# Patient Record
Sex: Female | Born: 1935 | Race: White | Hispanic: No | Smoking: Former smoker
Health system: Southern US, Community
[De-identification: ages and names within clinical notes are randomized; demographics above are authoritative.]

## PROBLEM LIST (undated history)

## (undated) DIAGNOSIS — J449 Chronic obstructive pulmonary disease, unspecified: Secondary | ICD-10-CM

## (undated) DIAGNOSIS — I2781 Cor pulmonale (chronic): Secondary | ICD-10-CM

## (undated) DIAGNOSIS — Z87891 Personal history of nicotine dependence: Secondary | ICD-10-CM

## (undated) DIAGNOSIS — J45909 Unspecified asthma, uncomplicated: Secondary | ICD-10-CM

## (undated) DIAGNOSIS — R0902 Hypoxemia: Secondary | ICD-10-CM

## (undated) DIAGNOSIS — Z9289 Personal history of other medical treatment: Secondary | ICD-10-CM

## (undated) HISTORY — DX: Chronic obstructive pulmonary disease, unspecified: J44.9

## (undated) HISTORY — DX: Hypoxemia: R09.02

## (undated) HISTORY — DX: Personal history of nicotine dependence: Z87.891

## (undated) HISTORY — DX: Cor pulmonale (chronic): I27.81

## (undated) HISTORY — DX: Personal history of other medical treatment: Z92.89

---

## 1969-08-24 HISTORY — PX: CHOLECYSTECTOMY: SHX55

## 2005-11-04 ENCOUNTER — Emergency Department (HOSPITAL_COMMUNITY): Admission: EM | Admit: 2005-11-04 | Discharge: 2005-11-04 | Payer: Self-pay | Admitting: Family Medicine

## 2005-12-13 ENCOUNTER — Encounter: Admission: RE | Admit: 2005-12-13 | Discharge: 2005-12-13 | Payer: Self-pay | Admitting: Family Medicine

## 2006-12-04 ENCOUNTER — Ambulatory Visit: Payer: Self-pay | Admitting: Family Medicine

## 2006-12-24 DIAGNOSIS — Z9289 Personal history of other medical treatment: Secondary | ICD-10-CM

## 2006-12-24 HISTORY — DX: Personal history of other medical treatment: Z92.89

## 2007-01-22 ENCOUNTER — Ambulatory Visit (HOSPITAL_COMMUNITY): Admission: RE | Admit: 2007-01-22 | Discharge: 2007-01-22 | Payer: Self-pay | Admitting: Gastroenterology

## 2007-01-22 HISTORY — PX: COLONOSCOPY: SHX174

## 2007-02-19 ENCOUNTER — Ambulatory Visit (HOSPITAL_COMMUNITY): Admission: RE | Admit: 2007-02-19 | Discharge: 2007-02-19 | Payer: Self-pay | Admitting: Ophthalmology

## 2007-02-19 ENCOUNTER — Encounter (INDEPENDENT_AMBULATORY_CARE_PROVIDER_SITE_OTHER): Payer: Self-pay | Admitting: Specialist

## 2007-11-10 ENCOUNTER — Ambulatory Visit: Payer: Self-pay | Admitting: Family Medicine

## 2007-12-13 LAB — HM DEXA SCAN

## 2007-12-20 ENCOUNTER — Emergency Department (HOSPITAL_COMMUNITY): Admission: EM | Admit: 2007-12-20 | Discharge: 2007-12-20 | Payer: Self-pay | Admitting: Emergency Medicine

## 2007-12-22 ENCOUNTER — Ambulatory Visit: Payer: Self-pay | Admitting: Family Medicine

## 2008-03-26 ENCOUNTER — Ambulatory Visit: Payer: Self-pay | Admitting: Family Medicine

## 2008-09-28 ENCOUNTER — Ambulatory Visit: Payer: Self-pay | Admitting: Family Medicine

## 2009-05-02 ENCOUNTER — Ambulatory Visit (HOSPITAL_COMMUNITY): Admission: RE | Admit: 2009-05-02 | Discharge: 2009-05-02 | Payer: Self-pay | Admitting: Ophthalmology

## 2010-01-07 ENCOUNTER — Emergency Department (HOSPITAL_COMMUNITY): Admission: EM | Admit: 2010-01-07 | Discharge: 2010-01-07 | Payer: Self-pay | Admitting: Family Medicine

## 2010-01-09 ENCOUNTER — Ambulatory Visit: Payer: Self-pay | Admitting: Family Medicine

## 2010-01-16 ENCOUNTER — Ambulatory Visit: Payer: Self-pay | Admitting: Internal Medicine

## 2010-01-16 DIAGNOSIS — F17200 Nicotine dependence, unspecified, uncomplicated: Secondary | ICD-10-CM | POA: Insufficient documentation

## 2010-01-16 DIAGNOSIS — J449 Chronic obstructive pulmonary disease, unspecified: Secondary | ICD-10-CM | POA: Insufficient documentation

## 2010-01-19 ENCOUNTER — Ambulatory Visit: Payer: Self-pay | Admitting: Family Medicine

## 2010-01-24 DIAGNOSIS — J449 Chronic obstructive pulmonary disease, unspecified: Secondary | ICD-10-CM

## 2010-01-24 HISTORY — DX: Chronic obstructive pulmonary disease, unspecified: J44.9

## 2010-01-26 ENCOUNTER — Ambulatory Visit: Payer: Self-pay | Admitting: Family Medicine

## 2010-02-21 ENCOUNTER — Ambulatory Visit: Payer: Self-pay | Admitting: Family Medicine

## 2010-02-28 ENCOUNTER — Telehealth: Payer: Self-pay | Admitting: Internal Medicine

## 2010-02-28 ENCOUNTER — Ambulatory Visit: Payer: Self-pay | Admitting: Internal Medicine

## 2010-09-20 ENCOUNTER — Ambulatory Visit: Payer: Self-pay | Admitting: Family Medicine

## 2010-09-30 ENCOUNTER — Emergency Department (HOSPITAL_COMMUNITY): Admission: EM | Admit: 2010-09-30 | Discharge: 2010-09-30 | Payer: Self-pay | Admitting: Emergency Medicine

## 2010-10-03 ENCOUNTER — Ambulatory Visit: Payer: Self-pay | Admitting: Family Medicine

## 2010-10-12 ENCOUNTER — Ambulatory Visit: Payer: Self-pay | Admitting: Family Medicine

## 2011-01-23 NOTE — Assessment & Plan Note (Signed)
Summary: Pulmonary/ summary f/u ov   Copy to:  Dr. Susann Givens Primary Provider/Referring Provider:  Dr. Sharlot Gowda  CC:  6 wk followup with PFT's.  Pt states that her breathing has improved some since last seen.  She states that she has cut down her smoking to less than 1/2 ppd.  No new complaints today.  She is currently on pred pack and augmentin fort sinus infection.Marland Kitchen  History of Present Illness: 30 yowf active smoker with chronic doe x mall walking x 2008 assoc withchronic am cough and congestion  January 16, 2010 cc sob acutely worse 01/05/2010 cough green mucus  and sob rx as uri with zmax, albuterol and benzoate and seen now @ Dr Susann Givens and daughters request.  Mucus turning white, breathing better.    February 28, 2010 6 wk followup with PFT's.  Pt states that her breathing has improved some since last seen.  She states that she has cut down her smoking to less than 1/2 ppd.  No new complaints today.  She is currently on pred pack and augmentin fort sinus infection. not limited  by sob, not using proaire at all.  no noct co's.  Pt denies any significant sore throat, dysphagia, itching, sneezing,  nasal congestion or excess secretions,  fever, chills, sweats, unintended wt loss, pleuritic or exertional cp, hempoptysis, change in activity tolerance  orthopnea pnd or leg swelling  Pt also denies any obvious fluctuation in symptoms with weather or environmental change or other alleviating or aggravating factors.     Pt denies any increase in rescue therapy over baseline, denies waking up needing it or having early am exacerbations of coughing/wheezing/ or dyspnea   Current Medications (verified): 1)  Benzonatate 100 Mg Caps (Benzonatate) .Marland Kitchen.. 1 Every 12 Hours As Needed 2)  Proair Hfa 108 (90 Base) Mcg/act Aers (Albuterol Sulfate) .... 2 Puffs Every 4 Hours As Needed 3)  Cvs Nasal Decongestant 30 Mg Tabs (Pseudoephedrine Hcl) .... A Directed As Needed 4)  Flutter Valve .... As  Directed  Allergies (verified): No Known Drug Allergies  Past History:  Past Medical History: COPD  GOLD III    - PFT's February 28, 2010 FEV1 1.04 (48%) ratio 49 with no better after B2 and DLC0 6.3 (42%) corrects to 43  Vital Signs:  Patient profile:   77 year old female Weight:      101 pounds BMI:     20.47 O2 Sat:      97 % on Room air Temp:     97.4 degrees F oral Pulse rate:   74 / minute BP sitting:   132 / 76  (left arm)  Vitals Entered By: Vernie Murders (February 28, 2010 11:50 AM)  O2 Flow:  Room air  Physical Exam  Additional Exam:  thin amb wf nad with minimally congested cough wt 98 January 16, 2010 > 101 February 28, 2010  HEENT mild turbinate edema.  Oropharynx no thrush or excess pnd or cobblestoning.  No JVD or cervical adenopathy. Mild accessory muscle hypertrophy. Trachea midline, nl thryroid. Chest was hyperinflated by percussion with diminished breath sounds and moderate increased exp time without wheeze. Hoover sign positive at mid inspiration. Regular rate and rhythm without murmur gallop or rub or increase P2 or edema.  Abd: no hsm, nl excursion. Ext warm without cyanosis or clubbing.     Impression & Recommendations:  Problem # 1:  COPD UNSPECIFIED (ICD-496) DDX of  difficult airways managment all start with  A and  include Adherence, Ace Inhibitors, Acid Reflux, Active Sinus Disease, Alpha 1 Antitripsin deficiency, Anxiety masquerading as Airways dz,  ABPA,  allergy(esp in young), Aspiration (esp in elderly), Adverse effects of DPI,  Active smokers, plus one B  = Beta blocker use..   Clearly Active smoking leads the list of usual suspects here. Other than smoking cessation, treatment is symptom directed with use of hfa reviewed.   Problem # 2:  SMOKER (ICD-305.1)  > 3 min spent   I took this opportunity to educate the patient regarding the consequences of smoking in airway disorders based on all the data we have from the multiple national lung health  studies indicating that smoking cessation, not choice of inhalers or physicians, is the most important aspect of care.  Discussed but not ready to committ to quit at this point - emphasized risks involved in continuing smoking and that patient should consider these in the context of the cost of smoking relative to the benefit obtained ( immediate effect would be better MC function, longterm would be less aecopd and progressive loss of best day fx)  Orders: Tobacco use cessation intermediate 3-10 minutes (44010)  Medications Added to Medication List This Visit: 1)  Cvs Nasal Decongestant 30 Mg Tabs (Pseudoephedrine hcl) .... A directed as needed 2)  Flutter Valve  .... As directed  Other Orders: Est. Patient Level III (27253)  Patient Instructions: 1)  Stopping smoking will do more for you than anything I can do 2)  Return if limited by breathing difficulty

## 2011-01-23 NOTE — Miscellaneous (Signed)
Summary: Orders Update pft charges  Clinical Lists Changes  Orders: Added new Service order of Carbon Monoxide diffusing w/capacity (94720) - Signed Added new Service order of Lung Volumes (94240) - Signed Added new Service order of Spirometry (Pre & Post) (94060) - Signed 

## 2011-01-23 NOTE — Progress Notes (Signed)
Summary: questions re: diet    Phone Note Call from Patient Call back at Home Phone [phone]   Caller: Patient Call For: [name] Summary of Call: pt wants to know why she is not allowed to eat dairy  or have caffein? (pt forgot to ask while she was here). pt says she was given these instructions at urgent care recently Initial call taken by: Tivis Ringer, CNA,  February 28, 2010 12:25 PM  Follow-up for Phone Call        will forward message to MW to address.  Aundra Millet Reynolds LPN  February 29, 980 1:19 PM  don't ssee a problem with either one from pulmonary perspective Follow-up by: Nyoka Cowden MD,  February 28, 2010 1:22 PM  Additional Follow-up for Phone Call Additional follow up Details #1::        LM with pt's spouse for pt to call me back. Aundra Millet Reynolds LPN  February 28, 1913 1:27 PM   returned phone call pt [phone].Darletta Moll  February 28, 2010 2:05 PM  advised pt per MW he sees no problem with caff. or dairy as far as pulmonary goes, But I advised ot to contact md at urgent care who told her to not consume these products because there might be another reason for her to avoid them .Carron Curie CMA  March 01, 2010 10:36 AM

## 2011-01-23 NOTE — Assessment & Plan Note (Signed)
Summary: Pulmonary/ eval copd / MC dysfunction/ add flutter   Visit Type:  Initial Consult Copy to:  Dr. Susann Givens Primary Provider/Referring Provider:  Dr. Sharlot Gowda  CC:  Dyspnea.  History of Present Illness: 7 yowf active smoker with chronic doe x mall walking x 2008 assoc withchronic am cough and congestion  January 16, 2010 cc sob acutely worse 01/05/2010 cough green mucus  and sob rx as uri with zmax, albuterol and benzoate and seen now @ Dr Susann Givens and daughters request.  Mucus turning white, breathing better.  Pt denies any significant sore throat, dysphagia, itching, sneezing,  nasal congestion or excess secretions,  fever, chills, sweats, unintended wt loss, pleuritic or exertional cp, hempoptysis, change in activity tolerance  orthopnea pnd or leg swelling Pt also denies any obvious fluctuation in symptoms with weather or environmental change or other alleviating or aggravating factors.       Current Medications (verified): 1)  Benzonatate 100 Mg Caps (Benzonatate) .Marland Kitchen.. 1 Every 12 Hours As Needed 2)  Proair Hfa 108 (90 Base) Mcg/act Aers (Albuterol Sulfate) .... 2 Puffs Every 4 Hours As Needed  Allergies (verified): No Known Drug Allergies  Past History:  Past Medical History: COPD, clinical dx  Past Surgical History: Cholecystectomy 1980's  Family History: Colon CA- Mother COPD 2 brothers, smokers  Social History: Married with children Retired Current smoker since age 82.  Smokes 1 ppd.  Review of Systems       The patient complains of shortness of breath with activity, productive cough, loss of appetite, and weight change.  The patient denies shortness of breath at rest, non-productive cough, coughing up blood, chest pain, irregular heartbeats, acid heartburn, indigestion, abdominal pain, difficulty swallowing, sore throat, tooth/dental problems, headaches, nasal congestion/difficulty breathing through nose, sneezing, itching, ear ache, anxiety, depression,  hand/feet swelling, joint stiffness or pain, rash, change in color of mucus, and fever.    Vital Signs:  Patient profile:   77 year old female Height:      59 inches Weight:      98.25 pounds BMI:     19.92 O2 Sat:      94 % on Room air Temp:     98.4 degrees F oral Pulse rate:   80 / minute BP sitting:   100 / 66  (left arm)  Vitals Entered By: Vernie Murders (January 16, 2010 1:34 PM)  O2 Flow:  Room air  Physical Exam  Additional Exam:  thin amb wf nad with gurgling cough wt 98 January 16, 2010  HEENT mild turbinate edema.  Oropharynx no thrush or excess pnd or cobblestoning.  No JVD or cervical adenopathy. Mild accessory muscle hypertrophy. Trachea midline, nl thryroid. Chest was hyperinflated by percussion with diminished breath sounds and moderate increased exp time without wheeze. Hoover sign positive at mid inspiration. Regular rate and rhythm without murmur gallop or rub or increase P2 or edema.  Abd: no hsm, nl excursion. Ext warm without cyanosis or clubbing.     Impression & Recommendations:  Problem # 1:  COPD UNSPECIFIED (ICD-496) Moderate to severe clinically with very poor cough mechanics suggesting mucociliary dysfunction   I took this opportunity to educate the patient regarding the consequences of smoking in airway disorders based on all the data we have from the multiple national lung health studies indicating that smoking cessation, not choice of inhalers or physicians, is the most important aspect of care.    For now strongly rec committ to quit, add mucinex and flutter  Problem # 2:  SMOKER (ICD-305.1) Discussed but not ready to committ to quit at this point - emphasized risks involved in continuing smoking and that patient should consider these in the context of the cost of smoking relative to the benefit obtained.   Medications Added to Medication List This Visit: 1)  Benzonatate 100 Mg Caps (Benzonatate) .Marland Kitchen.. 1 every 12 hours as needed 2)  Proair Hfa  108 (90 Base) Mcg/act Aers (Albuterol sulfate) .... 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

## 2011-02-14 ENCOUNTER — Ambulatory Visit (INDEPENDENT_AMBULATORY_CARE_PROVIDER_SITE_OTHER): Payer: Medicare Other | Admitting: Family Medicine

## 2011-02-14 DIAGNOSIS — M25559 Pain in unspecified hip: Secondary | ICD-10-CM

## 2011-04-03 LAB — CBC
HCT: 41.6 % (ref 36.0–46.0)
Hemoglobin: 14.3 g/dL (ref 12.0–15.0)
MCHC: 34.4 g/dL (ref 30.0–36.0)
MCV: 92.3 fL (ref 78.0–100.0)
Platelets: 200 10*3/uL (ref 150–400)
RBC: 4.51 MIL/uL (ref 3.87–5.11)
RDW: 14.5 % (ref 11.5–15.5)
WBC: 10.2 10*3/uL (ref 4.0–10.5)

## 2011-04-03 LAB — BASIC METABOLIC PANEL
BUN: 14 mg/dL (ref 6–23)
CO2: 29 mEq/L (ref 19–32)
Calcium: 9.6 mg/dL (ref 8.4–10.5)
Chloride: 101 mEq/L (ref 96–112)
Creatinine, Ser: 0.93 mg/dL (ref 0.4–1.2)
GFR calc Af Amer: >60 mL/min (ref >60)
GFR calc non Af Amer: 59 mL/min — ABNORMAL LOW (ref >60)
Glucose, Bld: 103 mg/dL — ABNORMAL HIGH (ref 70–99)
Potassium: 4.3 mEq/L (ref 3.5–5.1)
Sodium: 137 mEq/L (ref 135–145)

## 2011-05-08 NOTE — Op Note (Signed)
[name], [name]            ACCOUNT NO.:  [id number]   MEDICAL RECORD NO.:  [id number]          PATIENT TYPE:  AMB   LOCATION:  [facility]                         FACILITY:  [facility]   PHYSICIAN:  Alford Highland. Rankin, M.D.   DATE OF BIRTH:  August 12, 1932   DATE OF PROCEDURE:  DATE OF DISCHARGE:  05/02/2009                               OPERATIVE REPORT   PREOPERATIVE DIAGNOSIS:  1. 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  2. History of vitrectomy for retained lens fragments.   COMPLICATIONS:  Previous cataract surgery with another physician.   POSTOPERATIVE DIAGNOSIS:  1. 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  2. History of  vitrectomy for retained lens fragments.   PROCEDURE:  1. Pneumatic retinopexy with retinal cryopexy of the right eye to      retinal tear and hole, each located at 12 o' clock position and      12:15 position respectively.  2. Injection of vitreous substitute - C3F8 100 concentration of 100%      of volume 0.25 mL near the pars plana.  3. Aqueous paracentesis therapeutic, right eye.   SURGEON:  Alford Highland. Rankin, M.D.   ANESTHESIA:  Local retrobulbar, monitored anesthesia control.   INDICATION FOR PROCEDURE:  The patient is a 77 year old woman who has  had inferior visual field loss in the right eye on the basis of a  rhegmatogenous retinal detachment.  This is an attempt to pneumatically  reattach the retina using retinal cryopexy for adhesive purposes.  The  patient understands the risk of anesthesia, including rare occurrence of  death, loss of the eye including but not limited to hemorrhage,  infection, scarring, need for further surgery, no change in vision, loss  of vision.  She understands that this is being done in the hospital and  in the operating room at night because this is post-office hours and the  macula is on and the need for urgent intervention.   PROCEDURE  IN DETAIL:  Appropriate signed consent was obtained.  The  patient was taken to the operating room.  In the operating room,  appropriate monitors followed by mild sedation; 2% Xylocaine 5 mL is  injected retrobulbar with additional 5 mL laterally in fashion of a  modified Darel Hong.  The right periocular region was then sterilely  prepped using dilute solution of Betadine prep solution to the eye  lashes and to the conjunctival surfaces.  A plastic lid sheet was  applied and lid speculum was applied.  Using clean techniques retinal  cryopexy was applied to the retinal breaks as mentioned at 12:00 and  12:30 position.  Thereafter, a 0.25 mL of C3F8 was injected via the pars  plana in the infranasal quadrant under direct visualization with a head  scope.  Secondly, aqueous paracentesis was then carried out to lower the  intraocular pressure and maintaining optic nerve perfusion.   The patient tolerated the procedure well without complication.  The  patient was taken to the PACU in  good and stable condition for early  positioning issues.  The patient will be seen as an outpatient tomorrow.      Alford Highland Rankin, M.D.  Electronically Signed     GAR/MEDQ  D:  05/02/2009  T:  05/03/2009  Job:  [id number]

## 2011-05-09 ENCOUNTER — Ambulatory Visit (INDEPENDENT_AMBULATORY_CARE_PROVIDER_SITE_OTHER): Payer: Medicare Other | Admitting: Medical

## 2011-05-09 ENCOUNTER — Encounter: Payer: Self-pay | Admitting: Medical

## 2011-05-09 VITALS — BP 130/90 | HR 72 | Temp 97.7°F | Ht 67.0 in | Wt 103.0 lb

## 2011-05-09 DIAGNOSIS — J4489 Other specified chronic obstructive pulmonary disease: Secondary | ICD-10-CM

## 2011-05-09 DIAGNOSIS — F17200 Nicotine dependence, unspecified, uncomplicated: Secondary | ICD-10-CM

## 2011-05-09 DIAGNOSIS — J40 Bronchitis, not specified as acute or chronic: Secondary | ICD-10-CM

## 2011-05-09 DIAGNOSIS — J449 Chronic obstructive pulmonary disease, unspecified: Secondary | ICD-10-CM

## 2011-05-09 MED ORDER — AZITHROMYCIN 500 MG PO TABS: 500.0000 mg | ORAL_TABLET | Freq: Every day | ORAL | Status: AC

## 2011-05-09 NOTE — Patient Instructions (Signed)
Acute Bronchitis Bronchitis is a problem of the air tubes leading to your lungs. Acute means the illness started quickly. In this condition, the lining of those tubes becomes puffy (swollen) and can leak fluid. This makes it harder for air to get in and out of your lungs. You may cough a lot. This is because the air tubes are narrow. Bronchitis is most often caused by a virus. Medicines that kill germs (antibiotics) may be needed with germ (bacteria) infections for people who:  Smoke.   Have lasting (chronic) lung problems.   Are elderly.  HOME CARE  Rest.   Drink enough water and fluids to keep the pee clear or pale yellow.   Only take medicine as told by your doctor.   Medicines may be prescribed that will open up the airways. This will help make breathing easier.   Bronchitis usually gets better on its own in a few days.  Recovery from some problems (symptoms) of bronchitis may be slow. You should start feeling a little better after 2 to 3 days. Coughing may last for 3 to 4 weeks. GET HELP RIGHT AWAY IF:  You or your child has a temperature by mouth above 101, not controlled by medicine.   Chills or chest pain develops.   You or your child develops very bad shortness of breath.   There is bloody saliva mixed with mucus (sputum).   You or your child throws up (vomits) often, loses too much fluid (dehydration), feels faint, or has a very bad headache.   You or your child does not improve after 1 week of treatment.  MAKE SURE YOU:   Understand these instructions.   Will watch this condition.   Will get help right away if you or your child is not doing well or gets worse.  Document Released: 05/28/2008 Document Re-Released: 03/06/2010 [employer] Patient Information 2011 [employer].  

## 2011-05-09 NOTE — Progress Notes (Signed)
Subjective:     [name] is a 77 y.o. female who presents for evaluation of productive cough.  Symptoms include congestion and productive cough with  green colored sputum. Onset of symptoms was 1 week ago, and has been gradually worsening since that time. Treatment to date: none.  Denies sick contacts.  No other aggravating or relieving factors.  She is a smoker and has consider quitting, but hasn't made that step yet.  She notes using inhaler on average 4 x/month.  Denies need for any other COPD intervention/medication changes currently. No other c/o.  The following portions of the patient's history were reviewed and updated as appropriate: allergies, current medications, past family history, past medical history, past social history, past surgical history and problem list.  Past Medical History  Diagnosis Date  . Osteoporosis   . Tobacco use disorder     Review of Systems Constitutional: denies fever, chills, sweats, anorexia Skin: denies rash HEENT: denies ear pain, itchy watery eyes, sore throat Cardiovascular: denies chest pain, palpitations Lungs: +mild SOB, +productive sputum; denies wheezing, hemoptysis, orthopnea, PND Abdomen: denies abdominal pain, nausea, vomiting, diarrhea GU: denies dysuria Extremities: denies edema, myalgias, arthralgias  Objective:   Filed Vitals:   05/09/11 1335  BP: 130/90  Pulse: 72  Temp: 97.7 F (36.5 C)    General appearance: Alert, WD/WN, no distress, ill appearing                             Skin: warm, no rash, no diaphoresis                           Head: no sinus tenderness                            Eyes: conjunctiva normal, corneas clear, PERRLA                            Ears: pearly TMs, external ear canals normal                          Nose: septum midline, turbinates swollen, with erythema and clear discharge             Mouth/throat: MMM, tongue normal, mild pharyngeal erythema                           Neck:  supple, no adenopathy, no thyromegaly, non tender                          Heart: RRR, normal S1, S2, no murmurs                         Lungs: +bronchial breath sounds, +scattered rhonchi, no wheezes, no rales                Extremities: no edema, nontender     Assessment:   Encounter Diagnoses  Name Primary?  . Bronchitis Yes  . COPD (chronic obstructive pulmonary disease)   . Tobacco use disorder      Plan:   Prescription given today for Zpak as below.  Discussed diagnosis and treatment of bronchitis.  Suggested symptomatic OTC remedies for cough and congestion.  Nasal saline spray for nasal congestion.  Tylenol OTC for fever and malaise.  Call/return in 2-3 days if symptoms are worse or not improving.  Advised that cough may linger even after the infection is improved.     Advised smoking cessation, counseled on smoking, gave hotline number for 1-800-QUIT-NOW.  She is not quite ready to quit.  Has nicotine patches at home but hasn't started them.  We discussed risks of continued tobacco use.

## 2011-05-11 NOTE — Op Note (Signed)
[name], [name]            ACCOUNT NO.:  [id number]   MEDICAL RECORD NO.:  [id number]          PATIENT TYPE:  AMB   LOCATION:  [facility]S                          FACILITY:  [facility]   PHYSICIAN:  Jillyn Hidden A. Rankin, M.D.   DATE OF BIRTH:  18-Apr-1932   DATE OF PROCEDURE:  02/19/2007  DATE OF DISCHARGE:                               OPERATIVE REPORT   PREOPERATIVE DIAGNOSES:  1. Dislocated intraocular lens into the vitreous cavity right eye.  2. Aphakia right eye.   POSTOPERATIVE DIAGNOSES:  1. Dislocated intraocular lens into the vitreous cavity right eye.  2. Aphakia right eye.   PROCEDURE:  1. Posterior vitrectomy right eye.  2. Removal of nonmagnetic foreign body, dislocated intraocular lens of      the vitreous cavity right eye.  3. Secondary insertion of posterior chamber intraocular lens into the      sulcus, Alcon model #[id number], power +24.5.  Serial [id number].   SURGEON:  Alford Highland. Rankin, M.D.   ANESTHESIA:  Local retrobulbar.   INDICATIONS FOR PROCEDURE:  The patient is a 77 year old woman who has  profound visual loss and development of sudden aphakia from a capsule  opening that developed phimosis which led to silicone plate lens  dislocated into the intraocular lens.  The silicone plate lens  dislocated into the vitreous cavity.  The patient understands this was a  spontaneous event and occurs from a consistent contracture.  The patient  understands the need for surgical intervention, including the need to  remove the lens but also place the new lens in place.  She understands  the risks of anesthesia that are including but not limited to  hemorrhage, infection, scarring, need for further surgery, no change in  vision, loss of vision, progression of disease, spinal nerve injury,  proper informed consent was obtained.   DESCRIPTION OF PROCEDURE:  The patient was taken to the operating room.  In the operating room appropriate monitoring of anesthesia.  Marcaine  0.25%  to the  retrobulbar block and an additional 5 mL later __________  lens.  The right periocular region was prepped and draped in the usual  sterile fashion.  The lid speculum was applied.  A 25 gauge trocar  system was then used to place infusion.  Superior trocar was applied.  Cut down perimetry was actually limited and fashioned superiorly.  A  grooved limbal incision was fashioned with a crescent blade followed by  anterior chamber opening of the MVI.  The anterior chamber was deepened  with Viscoat.  At this time, the __________ was then begun.  BLOM  attachment had been used on the microscope.  The excursion  transversed  60 degrees.  The intraocular lens was found lying on the posterior pole.  This anterior chamber was then opened through the grooved limbal  incision with MVR blade.  Anterior chamber had been __________.  This  allowed for grasping of the intraocular lens with a 25 gauge forceps  posteriorly and this was brought forward to the plane of the iris.  Through the grooved limbal incision, the limbal wound was enlarged and a  separate 20 gauge forceps were then used through the limbal wound to  grasp the intraocular plate lens through the pupillary opening and this  was externalized without difficulty.  The limbal wound was opened to  allow acceptance of the PMMA lens.  The lens described previously was  then placed in the sulcus directed to the horizontal position with  excellent centration and sulcus support.  The anterior chamber was then  irrigated to free the mobilizer Viscoat.  The limbal incision was then  closed with interrupted 10-0 nylon sutures x4.  Conjunctiva was then  closed with 2-0 Vicryl.  The wound was checked and found to be  adequately secured.  The superior trocar was removed.  The inferior  trocar removed.  Subconjunctival and Decadron were applied.  Sterile  patch and Fox shield  applied.  The patient tolerated the procedure  without  complication.      Alford Highland Rankin, M.D.  Electronically Signed     GAR/MEDQ  D:  02/19/2007  T:  02/20/2007  Job:  [id number]

## 2011-08-31 ENCOUNTER — Telehealth: Payer: Self-pay | Admitting: Family Medicine

## 2011-08-31 MED ORDER — ALBUTEROL SULFATE HFA 108 (90 BASE) MCG/ACT IN AERS: 1.0000 | INHALATION_SPRAY | RESPIRATORY_TRACT | Status: DC

## 2011-08-31 NOTE — Telephone Encounter (Signed)
Re ordered pro air for pt

## 2011-09-13 ENCOUNTER — Encounter: Payer: Self-pay | Admitting: Family Medicine

## 2011-09-21 ENCOUNTER — Encounter: Payer: Self-pay | Admitting: Family Medicine

## 2011-09-21 ENCOUNTER — Telehealth: Payer: Self-pay | Admitting: Family Medicine

## 2011-09-21 NOTE — Telephone Encounter (Signed)
Pt said when she picked up her Pro Air Inhaler the directions had changed.  In the past q puff q 4 hr prn cough or sob.  New directions say q puff qod.  Please advise patient.

## 2011-09-21 NOTE — Telephone Encounter (Signed)
The prescription should be 2 puffs every 4 hours as needed

## 2011-09-28 NOTE — Telephone Encounter (Signed)
Advised pt of same. 

## 2011-11-12 ENCOUNTER — Other Ambulatory Visit: Payer: Self-pay | Admitting: Medical

## 2011-11-12 ENCOUNTER — Telehealth: Payer: Self-pay | Admitting: Family Medicine

## 2011-11-12 MED ORDER — ALBUTEROL SULFATE HFA 108 (90 BASE) MCG/ACT IN AERS: 1.0000 | INHALATION_SPRAY | RESPIRATORY_TRACT | Status: DC

## 2011-11-13 NOTE — Telephone Encounter (Signed)
[name]

## 2011-12-07 ENCOUNTER — Other Ambulatory Visit: Payer: Self-pay | Admitting: Dermatology

## 2012-01-21 ENCOUNTER — Ambulatory Visit (INDEPENDENT_AMBULATORY_CARE_PROVIDER_SITE_OTHER): Payer: Medicare Other | Admitting: Medical

## 2012-01-21 ENCOUNTER — Encounter: Payer: Self-pay | Admitting: Medical

## 2012-01-21 VITALS — BP 140/70 | HR 78 | Temp 98.2°F | Resp 18 | Wt 97.0 lb

## 2012-01-21 DIAGNOSIS — J9612 Chronic respiratory failure with hypercapnia: Secondary | ICD-10-CM | POA: Insufficient documentation

## 2012-01-21 DIAGNOSIS — F17200 Nicotine dependence, unspecified, uncomplicated: Secondary | ICD-10-CM

## 2012-01-21 DIAGNOSIS — J441 Chronic obstructive pulmonary disease with (acute) exacerbation: Secondary | ICD-10-CM | POA: Diagnosis not present

## 2012-01-21 DIAGNOSIS — R0902 Hypoxemia: Secondary | ICD-10-CM | POA: Diagnosis not present

## 2012-01-21 DIAGNOSIS — R0602 Shortness of breath: Secondary | ICD-10-CM | POA: Diagnosis not present

## 2012-01-21 MED ORDER — PREDNISONE 10 MG PO TABS: ORAL_TABLET | ORAL | Status: DC

## 2012-01-21 MED ORDER — METHYLPREDNISOLONE SODIUM SUCC 125 MG IJ SOLR
125.0000 mg | Freq: Once | INTRAMUSCULAR | Status: AC
  Administered 2012-01-21: 125 mg via INTRAMUSCULAR

## 2012-01-21 MED ORDER — AZITHROMYCIN 500 MG PO TABS: 500.0000 mg | ORAL_TABLET | Freq: Every day | ORAL | Status: AC

## 2012-01-21 NOTE — Patient Instructions (Signed)
Your oxygen level is quite low today.  We are setting up home health to bring out oxygen today to your home.  I want you to use 3 liters/min of oxygen.  DO NOT SMOKE WHILE USING OXYGEN OR IT WILL BLOW UP/CATCH FIRE.  Smoke outside or not at all.  Begin Azithromycin antibiotic, begin Prednisone taper.  I sent these to your pharmacy.    Use your inhaler 2 puffs, every 4-6 hours.  Recheck tomorrow here.    If fever, unable to breath or worse over night, go to the hospital.

## 2012-01-21 NOTE — Progress Notes (Signed)
Subjective:   HPI  [name] is a 78 y.o. female who presents for 3 day hx/o cough and yellow sputum.   Otherwise hasn't really felt all that bad.  Denies being particularly SOB or wheezy.  Uses albuterol prn, but not daily.  Use a couple times last few days.  Denies fever, ST, ear pain, chest pain, nausea.  She does have 20+ pack year of smoking, currently smoking about 1/2 ppd, and no prior hx/o oxygen therapy.  No other aggravating or relieving factors.    No other c/o.  The following portions of the patient's history were reviewed and updated as appropriate: allergies, current medications, past family history, past medical history, past social history, past surgical history and problem list.  Past Medical History  Diagnosis Date  . Osteoporosis   . Tobacco use disorder   . COPD (chronic obstructive pulmonary disease)     Allergies  Allergen Reactions  . Levofloxacin     Current Outpatient Prescriptions on File Prior to Visit  Medication Sig Dispense Refill  . albuterol (PROAIR HFA) 108 (90 BASE) MCG/ACT inhaler Inhale 1 puff into the lungs every other day.  18 g  1  . Albuterol Sulfate (PROAIR HFA IN) Inhale 1 puff into the lungs every other day.         No current facility-administered medications on file prior to visit.     Review of Systems Constitutional: denies fever, chills, sweats, unexpected weight change, fatigue ENT: no runny nose, ear pain, sore throat Cardiology: denies chest pain, palpitations, edema Gastroenterology: denies abdominal pain, nausea, vomiting, diarrhea      Objective:   Physical Exam  General appearance: alert, no distress, WD/WN, thin white female HEENT: normocephalic, sclerae anicteric, TMs pearly, nares patent, no discharge or erythema, pharynx normal Oral cavity: MMM, no lesions Neck: supple, no lymphadenopathy, no thyromegaly, no masses Heart: RRR, normal S1, S2, no murmurs Lungs: decreased breath sounds, few expiratory  wheezes, hyperresonant on percussion Pulses: 2+ symmetric, upper and lower extremities, normal cap refill Ext: no edema, no cyanosis, +clubbing   Assessment and Plan :     Encounter Diagnoses  Name Primary?  . Shortness of breath Yes  . Hypoxemia    Upon presentation she was 80% pulse ox room air.  She didn't appear particularly dyspneic, but I advised she go to the ED.   She declined.  We started 1 round of albuterol nebulizer, checked a chest xray, and lungs were hyperinflated, but no obvious mass or pneumonia.  Will send xray for over read.  After nebulizer, she only improved slightly, but pulse ox remained low 80s.  She still declined emergency dept eval.  Thus, assume she probably runs in the high 80/low 90 O2 sat.  We gave 125mg  Solumedrol IM in office.  Set her up for home oxygen this afternoon at 3L/min, and she will see me again tomorrow.  If worse over night, she was advised to go to the ED.    Once symptoms improve over the next few days, she will need further evaluation on her COPD which is obviously worsening.  She continues to smoke.  Advised she not smoke.

## 2012-01-22 ENCOUNTER — Encounter: Payer: Self-pay | Admitting: Medical

## 2012-01-22 ENCOUNTER — Ambulatory Visit (INDEPENDENT_AMBULATORY_CARE_PROVIDER_SITE_OTHER): Payer: Medicare Other | Admitting: Medical

## 2012-01-22 VITALS — BP 100/70 | HR 92 | Temp 98.1°F | Resp 18

## 2012-01-22 DIAGNOSIS — J449 Chronic obstructive pulmonary disease, unspecified: Secondary | ICD-10-CM

## 2012-01-22 DIAGNOSIS — F17200 Nicotine dependence, unspecified, uncomplicated: Secondary | ICD-10-CM | POA: Diagnosis not present

## 2012-01-22 DIAGNOSIS — R0902 Hypoxemia: Secondary | ICD-10-CM | POA: Diagnosis not present

## 2012-01-22 MED ORDER — ALBUTEROL SULFATE HFA 108 (90 BASE) MCG/ACT IN AERS: 2.0000 | INHALATION_SPRAY | Freq: Four times a day (QID) | RESPIRATORY_TRACT | Status: DC | PRN

## 2012-01-22 NOTE — Patient Instructions (Signed)
For the next few days, continue your inhaler (albuterol/Proair), 2 puffs three times a day, or as needed.   After you are feeling better, then use this as needed.  Finish the prednisone/steroid taper.  Continue to use oxygen, 3 liter/min, through Friday.  After Friday, you can use this as needed.  We will refer you back to pulmonology/Dr. Sherene Sires, to recheck on the COPD.  I believe there are other treatment that may benefit you.  These may include a combination inhaler, oxygen, pulmonary rehab, or others.  But, you also have to consider stopping smoking completely.   We will check your blood count today to make sure you are not anemic.  Otherwise, consider returning for a physical and general recheck, including check on your weight.

## 2012-01-22 NOTE — Progress Notes (Signed)
Subjective:   HPI  [name] is a 78 y.o. female who presents for 1 day f/u.  I saw her yesterday for 3 day hx/o cough and yellow sputum.  However, she presented with pulse ox of 80% room air.  She would not go to the ED.  Thus, I had home health take out oxygen to her home, gave Solumedrol 125mg  IM here, started her on Prednisone taper, Azithromycin, and increased her albuterol inhaler frequency.  She feels a little better today.   Denies fever, ST, ear pain, chest pain, nausea.  She does have 20+ pack year of smoking, currently smoking about 1/2 ppd, and no prior hx/o oxygen therapy.  She has seen Dr. Ocie Doyne in the past, but last f/u over a year ago.  No other aggravating or relieving factors.    No other c/o.  The following portions of the patient's history were reviewed and updated as appropriate: allergies, current medications, past family history, past medical history, past social history, past surgical history and problem list.  Past Medical History  Diagnosis Date  . Osteoporosis   . Current smoker   . COPD (chronic obstructive pulmonary disease)     Allergies  Allergen Reactions  . Levofloxacin     Current Outpatient Prescriptions on File Prior to Visit  Medication Sig Dispense Refill  . albuterol (PROAIR HFA) 108 (90 BASE) MCG/ACT inhaler Inhale 1 puff into the lungs every other day.  18 g  1  . Albuterol Sulfate (PROAIR HFA IN) Inhale 1 puff into the lungs every other day.        Marland Kitchen azithromycin (ZITHROMAX) 500 MG tablet Take 1 tablet (500 mg total) by mouth daily.  5 tablet  0  . predniSONE (DELTASONE) 10 MG tablet 6/5/4/3/2/1 taper  21 tablet  0   Current Facility-Administered Medications on File Prior to Visit  Medication Dose Route Frequency Provider Last Rate Last Dose  . methylPREDNISolone sodium succinate (SOLU-MEDROL) 125 MG injection 125 mg  125 mg Intramuscular Once Carollee Herter, MD   125 mg at 01/21/12 1645     Review of  Systems Constitutional: denies fever, chills, sweats, unexpected weight change, fatigue ENT: no runny nose, ear pain, sore throat Cardiology: denies chest pain, palpitations, edema Gastroenterology: denies abdominal pain, nausea, vomiting, diarrhea      Objective:   Physical Exam  General appearance: alert, no distress, WD/WN, thin white female Oral cavity: MMM, no lesions Neck: supple, no lymphadenopathy, no thyromegaly, no masses Heart: RRR, normal S1, S2, no murmurs Lungs: decreased breath sounds, no wheezes, rhonchi or rales, hyperresonant on percussion Pulses: 2+ symmetric, upper and lower extremities, normal cap refill Ext: no edema, no cyanosis Skin: nails with whitish appearance and vertical ridges   Assessment and Plan :     Encounter Diagnoses  Name Primary?  Marland Kitchen COPD (chronic obstructive pulmonary disease) Yes  . Hypoxia   . Current smoker    Pulse today 90% room air.  She feels a little better today.  She did start oxygen yesterday evening and has been using around the clock, using inhaler 2-3 times in last 24 hours, started the antibiotic.  We had long discussion today about getting over the acute flare, getting f/u with pulmonology soon, and having overall routine screening soon.  Refilled her Proair.   She will finish prednisone taper, Azithromycin, c/t albuterol inhaler, and call report Friday.  She does agree to go back and see Dr. Ocie Doyne for further eval and recheck on  worsening COPD.  We discussed the fact that she may need additional eval such as spirometry, possible oximetry, and other eval.  Discussed possible treatment options, but also discussed need to stop smoking.    We also discussed her overall health, the fact that she hasn't had routine physical/screening/labs in several years.  She seems reluctant to do any screening, although she acknowledges some weight loss since the summer 2012.  I recommended she return soon for a physical, but its certainly her  choice.  She will call me back Friday to give me an update on how she is feeling.  If worse in the meantime - dyspnea, fever, not improving, then call/return/or go to the ED.

## 2012-01-23 ENCOUNTER — Telehealth: Payer: Self-pay | Admitting: Family Medicine

## 2012-01-23 LAB — CBC WITH DIFFERENTIAL/PLATELET
Basophils Absolute: 0 10*3/uL (ref 0.0–0.1)
Basophils Relative: 0 % (ref 0–1)
Eosinophils Absolute: 0 10*3/uL (ref 0.0–0.7)
Eosinophils Relative: 0 % (ref 0–5)
HCT: 42 % (ref 36.0–46.0)
Hemoglobin: 13.8 g/dL (ref 12.0–15.0)
Lymphocytes Relative: 2 % — ABNORMAL LOW (ref 12–46)
Lymphs Abs: 0.4 10*3/uL — ABNORMAL LOW (ref 0.7–4.0)
MCH: 31.2 pg (ref 26.0–34.0)
MCHC: 32.9 g/dL (ref 30.0–36.0)
MCV: 95 fL (ref 78.0–100.0)
Monocytes Relative: 7 % (ref 3–12)
Neutro Abs: 19.5 10*3/uL — ABNORMAL HIGH (ref 1.7–7.7)
Neutrophils Relative %: 92 % — ABNORMAL HIGH (ref 43–77)
Platelets: 201 10*3/uL (ref 150–400)
RBC: 4.42 MIL/uL (ref 3.87–5.11)
RDW: 15.2 % (ref 11.5–15.5)

## 2012-01-23 NOTE — Telephone Encounter (Signed)
Message copied by Janeice Robinson on Wed Jan 23, 2012  2:33 PM ------      Message from: Shaune Spittle D      Created: Wed Jan 23, 2012 11:33 AM       [name]        01/21/2012 4:15 PM Office Visit       MRN: [id number]                   No order for nebulizer treatment

## 2012-01-23 NOTE — Telephone Encounter (Signed)
Message copied by Janeice Robinson on Wed Jan 23, 2012 12:24 PM ------      Message from: Shaune Spittle D      Created: Wed Jan 23, 2012 11:33 AM       [name]        01/21/2012 4:15 PM Office Visit       MRN: [id number]                   No order for nebulizer treatment

## 2012-01-23 NOTE — Telephone Encounter (Signed)
I sent [name] a message to remind him to place the charges in the patients chart. CLS

## 2012-01-25 ENCOUNTER — Telehealth: Payer: Self-pay | Admitting: Family Medicine

## 2012-01-25 NOTE — Telephone Encounter (Signed)
I'm sorry that you didn't understand my message. CLS  Patient states that she is breathing much better. I advised her to continue the oxygen until Monday and then she can start to reduce it but she needs to wait before she returns it until she has her office visit with Dr. Sherene Sires. Patient agreed. CLS

## 2012-01-25 NOTE — Telephone Encounter (Signed)
Received a voice mail from [employer] that stated she has set up the oxygen for [name] and if you need that faxed to call her at [phone]

## 2012-01-25 NOTE — Telephone Encounter (Signed)
This msg doesn't make sense.  Pls explain.

## 2012-02-08 ENCOUNTER — Encounter: Payer: Self-pay | Admitting: Internal Medicine

## 2012-02-08 ENCOUNTER — Telehealth: Payer: Self-pay | Admitting: Internal Medicine

## 2012-02-08 ENCOUNTER — Ambulatory Visit (INDEPENDENT_AMBULATORY_CARE_PROVIDER_SITE_OTHER): Payer: Medicare Other | Admitting: Internal Medicine

## 2012-02-08 VITALS — BP 112/78 | HR 95 | Temp 97.6°F | Ht 67.0 in | Wt 97.8 lb

## 2012-02-08 DIAGNOSIS — F17200 Nicotine dependence, unspecified, uncomplicated: Secondary | ICD-10-CM | POA: Diagnosis not present

## 2012-02-08 DIAGNOSIS — J449 Chronic obstructive pulmonary disease, unspecified: Secondary | ICD-10-CM

## 2012-02-08 NOTE — Patient Instructions (Addendum)
Moderate copd/ emphysema (not severe)  I reviewed the Flethcher curve with you  that basically indicates  if you quit smoking when your best day FEV1 is still well preserved (which yours is) it is highly unlikely you will progress to severe disease and informed the patient there was no medication on the market that has proven to change the curve or the likelihood of progression.  Therefore stopping smoking and maintaining abstinence is the most important aspect of care, not choice of inhalers or for that matter, doctors.    Work on inhaler technique:  relax and gently blow all the way out then take a nice smooth deep breath back in, triggering the inhaler at same time you start breathing in.  Hold for up to 5 seconds if you can.  Rinse and gargle with water when done   If your mouth or throat starts to bother you,   I suggest you time the inhaler to your dental care and after using the inhaler(s) brush teeth and tongue with a baking soda containing toothpaste and when you rinse this out, gargle with it first to see if this helps your mouth and throat.     Please schedule a follow up office visit in 6 weeks, call sooner if needed  Late add:  Needs cxr on return

## 2012-02-08 NOTE — Telephone Encounter (Signed)
pt states that she does not want the oxygen at her house anymore. i asked her if the pulomologist told her that she didnt need it anymore and she said that they didnt say anything that she thinks she dont need it. i told her that we would have to wait until the office notes came in and you looked at them to see what was said.

## 2012-02-08 NOTE — Progress Notes (Signed)
  Subjective:    Patient ID: BIONCA MCKEY, female    DOB: 04/03/1932   MRN: [id number]  HPI  52 yowf active smoker referred 02/08/2012 by [name] for intermittent sob   02/08/2012 1st pulmonary ov/ cc "intermittently" bad breathing x2-3 y  but on  best day can do  [employer] full [place]is[place]  but not mall with episodes much worse last starting 3 weeks prior to OV  rx already by abx and prednisone and some better.  Assoc with congested cough slt discolored better p abx but really has year round am cough x years, worse in winter  Sleeping ok without nocturnal  or early am exacerbation  of respiratory  c/o's or need for noct saba. Also denies any obvious fluctuation of symptoms with weather or environmental changes or other aggravating or alleviating factors except as outlined above   Review of Systems  Constitutional: Negative for fever, chills and unexpected weight change.  HENT: Negative for ear pain, nosebleeds, congestion, sore throat, rhinorrhea, sneezing, trouble swallowing, dental problem, voice change, postnasal drip and sinus pressure.   Eyes: Negative for visual disturbance.  Respiratory: Positive for cough and shortness of breath. Negative for choking.   Cardiovascular: Negative for chest pain and leg swelling.  Gastrointestinal: Negative for vomiting, abdominal pain and diarrhea.  Genitourinary: Negative for difficulty urinating.  Musculoskeletal: Negative for arthralgias.  Skin: Negative for rash.  Neurological: Negative for tremors, syncope and headaches.  Hematological: Bruises/bleeds easily.       Objective:   Physical Exam   Thin amb wf 97 02/08/2012   HEENT mild turbinate edema.  Oropharynx no thrush or excess pnd or cobblestoning.  No JVD or cervical adenopathy. Mild accessory muscle hypertrophy. Trachea midline, nl thryroid. Chest was hyperinflated by percussion with diminished breath sounds and moderate increased exp time without wheeze. Hoover sign positive at mid  inspiration. Regular rate and rhythm without murmur gallop or rub or increase P2 or edema.  Abd: no hsm, nl excursion. Ext warm without cyanosis or clubbing.    cxr ordered 01/21/12 copd with ? Right lung base infiltrate    Assessment & Plan:

## 2012-02-08 NOTE — Telephone Encounter (Signed)
i reviewed Dr. Thurston Hole office note.  She was 89% pulse ox there.  Pls call their office and ask nurse if she was intended to stop oxygen at this time.  I didn't see a clarification in the note.  If this is correct, we need to call [employer] to come and get the oxygen supplies.

## 2012-02-09 NOTE — Assessment & Plan Note (Addendum)
DDX of  difficult airways managment all start with A and  include Adherence, Ace Inhibitors, Acid Reflux, Active Sinus Disease, Alpha 1 Antitripsin deficiency, Anxiety masquerading as Airways dz,  ABPA,  allergy(esp in young), Aspiration (esp in elderly), Adverse effects of DPI,  Active smokers, plus two Bs  = Bronchiectasis and Beta blocker use..and one C= CHF  Active smoking is obviously that biggest factor here and all others pale in comparison  I reviewed the Flethcher curve with patient that basically indicates  if you quit smoking when your best day FEV1 is still well preserved it is highly unlikely you will progress to severe disease and informed the patient there was no medication on the market that has proven to change the curve or the likelihood of progression.  Therefore stopping smoking and maintaining abstinence is the most important aspect of care, not choice of inhalers or for that matter, doctors.     The proper method of use, as well as anticipated side effects, of this metered-dose inhaler are discussed and demonstrated to the patient. Improved to 50% with coaching.  See instructions for specific recommendations which were reviewed directly with the patient who was given a copy with highlighter outlining the key components.

## 2012-02-09 NOTE — Assessment & Plan Note (Signed)
>   3 min disussion   I emphasized that although we never turn away smokers from the pulmonary clinic, we do ask that they understand that the recommendations that we make  won't work nearly as well in the presence of continued cigarette exposure.  In fact, we may very well  reach a point where we can't promise to help the patient if he/she can't quit smoking. (We can and will promise to try to help, we just can't promise what we recommend will really work)

## 2012-02-11 ENCOUNTER — Telehealth: Payer: Self-pay | Admitting: Internal Medicine

## 2012-02-11 NOTE — Telephone Encounter (Signed)
DR. Thurston Hole NURSE SAID Georgene DID NOT NEED TO CONTINUE THE OXYGEN. I CALLED [employer] INFORMED THEM TO PICK UP THE OXYGEN AND I ALSO INFORMED Lisamarie. CLS

## 2012-02-11 NOTE — Telephone Encounter (Signed)
I sp[oke with Merita Norton and advised her of MW response. She voiced her understanding and had no further questions. She stated she will inform pt of this

## 2012-02-11 NOTE — Telephone Encounter (Signed)
Spoke with [place]. She states that [employer], [name] started the pt on o2 at last ov with him b/c her sats were low if the office. She states that after he reviewed the ov note from when pt was seen here, there was no mention of o2 use and sats were 89% ra, so he is wanting to know if the pt needs to continue on o2 or not. The nurse was unsure how o2 was prescribed for the pt. Please advise, thanks!

## 2012-02-11 NOTE — Telephone Encounter (Signed)
ATC, the office is closed until 2 pm, will call back then I do not recall the pt letting us know that she used o2. ? When does she use it?

## 2012-02-11 NOTE — Telephone Encounter (Signed)
She is qualified based on Tysinger's eval of resting sat < 88 but we can't independently verify the need base on our records. Can address this at next ov planned or could order ono RA to qualify if needed

## 2012-02-25 ENCOUNTER — Telehealth: Payer: Self-pay | Admitting: Internal Medicine

## 2012-02-25 MED ORDER — ALBUTEROL SULFATE HFA 108 (90 BASE) MCG/ACT IN AERS: 2.0000 | INHALATION_SPRAY | RESPIRATORY_TRACT | Status: DC | PRN

## 2012-02-25 NOTE — Telephone Encounter (Signed)
Albuterol called in.

## 2012-03-20 ENCOUNTER — Other Ambulatory Visit: Payer: Self-pay | Admitting: Internal Medicine

## 2012-03-20 DIAGNOSIS — J449 Chronic obstructive pulmonary disease, unspecified: Secondary | ICD-10-CM

## 2012-03-24 ENCOUNTER — Ambulatory Visit (INDEPENDENT_AMBULATORY_CARE_PROVIDER_SITE_OTHER): Payer: Medicare Other | Admitting: Internal Medicine

## 2012-03-24 ENCOUNTER — Encounter: Payer: Self-pay | Admitting: Internal Medicine

## 2012-03-24 VITALS — BP 118/74 | HR 89 | Temp 97.4°F | Ht 67.0 in | Wt 99.0 lb

## 2012-03-24 DIAGNOSIS — F17200 Nicotine dependence, unspecified, uncomplicated: Secondary | ICD-10-CM | POA: Diagnosis not present

## 2012-03-24 DIAGNOSIS — J449 Chronic obstructive pulmonary disease, unspecified: Secondary | ICD-10-CM

## 2012-03-24 LAB — PULMONARY FUNCTION TEST

## 2012-03-24 MED ORDER — TIOTROPIUM BROMIDE MONOHYDRATE 18 MCG IN CAPS: 18.0000 ug | ORAL_CAPSULE | Freq: Every day | RESPIRATORY_TRACT | Status: DC

## 2012-03-24 NOTE — Progress Notes (Addendum)
  Subjective:    Patient ID: [name], female    DOB: 04-May-1932   MRN: [id number]  HPI  85 yowf active smoker referred 02/08/2012 by [name] for intermittent sob   02/08/2012 1st pulmonary ov/ cc "intermittently" bad breathing x 2-3 y  but on  best day can do  [employer] [place]  but not mall with episodes much worse last starting 3 weeks prior to OV  rx already by abx and prednisone and some better.  Assoc with congested cough slt discolored better p abx but really has year round am cough x years, worse in winter rec Moderate copd/ emphysema (not severe) I reviewed the Flethcher curve    Work on inhaler technique:  Late add:  Needs cxr on return  03/24/2012 f/u ov/[name] cc doe not changed only  using albuterol but 4 x weekly, minimal am cough, mucoid. No active sinus or hb symptoms  Sleeping ok without nocturnal  or early am exacerbation  of respiratory  c/o's or need for noct saba. Also denies any obvious fluctuation of symptoms with weather or environmental changes or other aggravating or alleviating factors except as outlined above.  ROS  At present neg for  any significant sore throat, dysphagia, dental problems, itching, sneezing,  nasal congestion or excess/ purulent secretions, ear ache,   fever, chills, sweats, unintended wt loss, pleuritic or exertional cp, hemoptysis, palpitations, orthopnea pnd or leg swelling.  Also denies presyncope, palpitations, heartburn, abdominal pain, anorexia, nausea, vomiting, diarrhea  or change in bowel or urinary habits, change in stools or urine, dysuria,hematuria,  rash, arthralgias, visual complaints, headache, numbness weakness or ataxia or problems with walking or coordination. No noted change in mood/affect or memory.                        Objective:   Physical Exam   Thin amb wf 97 02/08/2012 > 03/24/2012  99  HEENT mild turbinate edema.  Oropharynx no thrush or excess pnd or cobblestoning.  No JVD or cervical adenopathy.  Mild accessory muscle hypertrophy. Trachea midline, nl thryroid. Chest was hyperinflated by percussion with diminished breath sounds and moderate increased exp time without wheeze. Hoover sign positive at mid inspiration. Regular rate and rhythm without murmur gallop or rub or increase P2 or edema.  Abd: no hsm, nl excursion. Ext warm without cyanosis or clubbing.    CXR  03/26/2012 :  Severe emphysematous and chronic bronchitic changes.      Assessment & Plan:

## 2012-03-24 NOTE — Progress Notes (Signed)
PFT done today. 

## 2012-03-24 NOTE — Patient Instructions (Signed)
Spiriva one capsule each am should gradually improve your activity tolerance  Stop smoking completely before smoking completely stops you (Good luck!!)   If you are satisfied with your treatment plan let your doctor know and he/she can either refill your medications or you can return here when your prescription runs out.     If in any way you are not 100% satisfied,  please tell us.  If 100% better, tell your friends!

## 2012-03-25 ENCOUNTER — Telehealth: Payer: Self-pay | Admitting: Internal Medicine

## 2012-03-25 ENCOUNTER — Telehealth: Payer: Self-pay | Admitting: *Deleted

## 2012-03-25 NOTE — Telephone Encounter (Signed)
I spoke with pt and explained to her over the phone how to use the spiriva inhaler. She voiced her understanding and had no further questions

## 2012-03-25 NOTE — Telephone Encounter (Signed)
Spoke with the pt. She states that she forgot to have the cxr done at [facility] and will come by one day this wk to have this done. I advised that we will call her with results once available.

## 2012-03-25 NOTE — Assessment & Plan Note (Signed)
-   HFA 50% effective p coaching 02/08/12  > 90% with DPI 03/24/12     - PFTs 03/24/2012  FEV1  0.91 (43%) ratio 48 and no better p B2,  DLCO 38% corrects to 53%  GOLD III and actively smoking  with marked decrease in dlco typical of emphysema s much perceived asthma/ bronchitis, so good candidate for LAMA > could not trigger tudorza so started on spiriva x one month

## 2012-03-25 NOTE — Assessment & Plan Note (Signed)
>   3 min discussion  I reviewed the Flethcher curve with patient that basically indicates  if you quit smoking when your best day FEV1 is still well preserved it is highly unlikely you will progress to severe disease and informed the patient there was no medication on the market that has proven to change the curve or the likelihood of progression.  Therefore stopping smoking and maintaining abstinence is the most important aspect of care, not choice of inhalers or for that matter, doctors.   

## 2012-03-25 NOTE — Telephone Encounter (Signed)
Message copied by Christen Butter on Tue Mar 25, 2012 10:15 AM ------      Message from: Nyoka Cowden      Created: Tue Mar 25, 2012  7:35 AM       Looks like she got out s going for the cxr - make sure she comes back this week for it if possible

## 2012-03-26 ENCOUNTER — Ambulatory Visit (INDEPENDENT_AMBULATORY_CARE_PROVIDER_SITE_OTHER)
Admission: RE | Admit: 2012-03-26 | Discharge: 2012-03-26 | Disposition: A | Discharge status: Home / Self Care | Payer: Medicare Other | Source: Ambulatory Visit | Attending: Internal Medicine | Admitting: Internal Medicine

## 2012-03-26 DIAGNOSIS — J449 Chronic obstructive pulmonary disease, unspecified: Secondary | ICD-10-CM

## 2012-03-26 DIAGNOSIS — R05 Cough: Secondary | ICD-10-CM | POA: Diagnosis not present

## 2012-03-26 DIAGNOSIS — J438 Other emphysema: Secondary | ICD-10-CM | POA: Diagnosis not present

## 2012-03-26 DIAGNOSIS — R0989 Other specified symptoms and signs involving the circulatory and respiratory systems: Secondary | ICD-10-CM | POA: Diagnosis not present

## 2012-03-27 ENCOUNTER — Telehealth: Payer: Self-pay | Admitting: Internal Medicine

## 2012-03-27 NOTE — Telephone Encounter (Signed)
Notes Recorded by Nyoka Cowden, MD on 03/26/2012 at 3:54 PM Call pt: Reviewed cxr and no acute change so no change in recommendations made at ov  I spoke with patient about results and she verbalized understanding and had no questions

## 2012-03-31 ENCOUNTER — Telehealth: Payer: Self-pay | Admitting: Internal Medicine

## 2012-03-31 NOTE — Telephone Encounter (Signed)
Pt returned call. [name] °

## 2012-03-31 NOTE — Telephone Encounter (Signed)
Pt states she needs help with getting Spiriva-I have mailed a patient assistance program for Spiriva to patient.

## 2012-03-31 NOTE — Telephone Encounter (Signed)
LMTCB

## 2012-04-04 ENCOUNTER — Telehealth: Payer: Self-pay | Admitting: Internal Medicine

## 2012-04-04 NOTE — Telephone Encounter (Signed)
Spoke with pt and walked her through filling out the form and advised once she is done bring it by here and can take it from here. Pt states nothing further needed.

## 2012-04-04 NOTE — Telephone Encounter (Signed)
lmomtcb  

## 2012-05-02 ENCOUNTER — Telehealth: Payer: Self-pay | Admitting: Internal Medicine

## 2012-05-02 NOTE — Telephone Encounter (Signed)
Since she is seeing Dr. Sherene Sires for COPD, she should call his office for albuterol refill first.  If unable to get refill, then let me know, but typically inhaler refills should be made by her lung doctor since we referred to him

## 2012-05-02 NOTE — Telephone Encounter (Signed)
I SPOKE WITH THE PATIENTS HUSBAND TO INFORM THEM THAT SHE WILL NEED TO CONTACT DR.WERT FOR REFILLS. CLS

## 2012-05-05 ENCOUNTER — Telehealth: Payer: Self-pay | Admitting: Internal Medicine

## 2012-05-05 DIAGNOSIS — H33329 Round hole, unspecified eye: Secondary | ICD-10-CM | POA: Diagnosis not present

## 2012-05-05 DIAGNOSIS — H33019 Retinal detachment with single break, unspecified eye: Secondary | ICD-10-CM | POA: Diagnosis not present

## 2012-05-05 DIAGNOSIS — H43819 Vitreous degeneration, unspecified eye: Secondary | ICD-10-CM | POA: Diagnosis not present

## 2012-05-05 NOTE — Telephone Encounter (Signed)
Doesn't usually cause this but copd can - needs ov with all actvie meds/ inhalters etc in hand to regroup

## 2012-05-05 NOTE — Telephone Encounter (Signed)
I spoke with pt and she states she has been using the spiriva x 1 month now and is having increased swelling in both ankles. She states she called her pcp and was advised to call MW. She states she like the sprivia bc it helps with her breathing. She is requesting recs from MW. Please advise thanks  Allergies  Allergen Reactions  . Levofloxacin

## 2012-05-05 NOTE — Telephone Encounter (Signed)
[email or web address].Deonta Bomberger, CMA  

## 2012-05-05 NOTE — Telephone Encounter (Signed)
lmomtcb x1 for pt 

## 2012-05-06 NOTE — Telephone Encounter (Signed)
Called spoke with patient, advised of MW's recs.  Pt stated that she is not on any other medications other than her spiriva and ventolin prn.  No vitamins, mvi, aspirin...nothing per patient.  Pt stated that the ankle swelling occurred approx 2 days after beginning the spiriva sample given at 4.1.13 ov.  Pt stated that she is doing very well on the spiriva with no breathing issues to date.  Denies swelling in hands, face, abdomen.    Dr Sherene Sires please advise, thanks.

## 2012-05-06 NOTE — Telephone Encounter (Signed)
I spoke with pt and is aware of MW response, She voiced her understanding and pt is scheduled to come in and see TP on monday

## 2012-05-06 NOTE — Telephone Encounter (Signed)
Best way to tell is try off spiriva to see if breathing changes or not and or swelling worse or not until return to office but no need to treat leg swelling especially over the phone - when returns can try alternative rx (samples of tudorza might be an option at ov)

## 2012-05-07 ENCOUNTER — Telehealth: Payer: Self-pay | Admitting: Internal Medicine

## 2012-05-07 NOTE — Telephone Encounter (Signed)
Nothing I can offer over the phone - if her breathing and coughing are better on turdorza then stay on it until ov.  Ok to see as a work in before the weekend but all meds and inhalers need to be brought in with her

## 2012-05-07 NOTE — Telephone Encounter (Signed)
Called spoke with patient who reported a "coughing spell" last night and this morning when she woke up.  Feels like Monday is "a long time to wait and not take anything" > appt with TP 5.20.13 at 3pm.  Reiterated to pt again MW's recs from yesterday's phone message:  Sandrea Hughs, MD 05/06/2012 12:52 PM Signed  Best way to tell is try off spiriva to see if breathing changes or not and or swelling worse or not until return to office but no need to treat leg swelling especially over the phone - when returns can try alternative rx (samples of tudorza might be an option at ov)  Pt is still requesting MW's recs anyway.  Dr Sherene Sires please advise, thanks.

## 2012-05-09 ENCOUNTER — Encounter: Payer: Self-pay | Admitting: Internal Medicine

## 2012-05-09 ENCOUNTER — Ambulatory Visit (INDEPENDENT_AMBULATORY_CARE_PROVIDER_SITE_OTHER): Payer: Medicare Other | Admitting: Internal Medicine

## 2012-05-09 VITALS — BP 124/88 | HR 97 | Temp 97.8°F | Ht 67.5 in | Wt 103.6 lb

## 2012-05-09 DIAGNOSIS — R0902 Hypoxemia: Secondary | ICD-10-CM

## 2012-05-09 DIAGNOSIS — F17200 Nicotine dependence, unspecified, uncomplicated: Secondary | ICD-10-CM

## 2012-05-09 DIAGNOSIS — J449 Chronic obstructive pulmonary disease, unspecified: Secondary | ICD-10-CM

## 2012-05-09 NOTE — Patient Instructions (Signed)
Try tudorza one twice daily - if you don't see the green turn red it didn't work   Please see patient coordinator before you leave today  to schedule overnight oximetry on room air  Please schedule a follow up office visit in 4 weeks, sooner if needed

## 2012-05-09 NOTE — Assessment & Plan Note (Signed)
-   PFTs 03/24/2012  FEV1  0.91 (43%) ratio 48 and no better p B2,  DLCO 38% corrects to 53% - HFP 50% effective 05/09/2012 but having trouble triggering tudorza    GOLD III and probably borderline hypoxemic RF > evolving cor pulmonale since 02 was stopped and still smoking (discussed separately)  The proper method of use, as well as anticipated side effects, of a metered-dose inhaler are discussed and demonstrated to the patient. Improved effectiveness after extensive coaching during this visit to a level of approximately  50% and not sure she'll be able to use tudorza but convinced the spriva helped > next step might be to use the new form of combivent but we have no samples to offer on trial basis so rec she try the tudorza first.

## 2012-05-09 NOTE — Assessment & Plan Note (Signed)
Suspect she has sign noct hypoxemia driving cor pulmonale > ono ordered

## 2012-05-09 NOTE — Progress Notes (Signed)
  Subjective:    Patient ID: [name], female    DOB: 11-12-1932   MRN: [id number]  HPI  81 yowf active smoker referred 02/08/2012 by [name] for intermittent sob was on 02 but stopped in early 2013    02/08/2012 1st pulmonary ov/ cc "intermittently" bad breathing x 2-3 y  but on  best day can do  [employer] full aisles  but not mall with episodes much worse last starting 3 weeks prior to OV  rx already by abx and prednisone and some better.  Assoc with congested cough slt discolored better p abx but really has year round am cough x years, worse in winter rec Moderate copd/ emphysema (not severe) I reviewed the Flethcher curve    Work on inhaler technique:  Late add:  Needs cxr on return  03/24/2012 f/u ov/Jeremih Dearmas cc doe not changed only  using albuterol but 4 x weekly, minimal am cough, mucoid. No active sinus or hb symptoms rec pt wants alternative to spiriva...feet and ankles swelling since beginning treatment 03/24/12 Pt stopped using 05/06/12   05/09/2012 f/u ov/Saisha Hogue cc leg swelling p first box of spiriva but did feel it helped breathing, swelling is worse on L sltly, better since held the spiriva. 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 No cough, overt sinus or reflux symptoms.   Sleeping ok without nocturnal  or early am exacerbation  of respiratory  c/o's or need for noct saba. Also denies any obvious fluctuation of symptoms with weather or environmental changes or other aggravating or alleviating factors except as outlined above.  ROS  At present neg for  any significant sore throat, dysphagia, dental problems, itching, sneezing,  nasal congestion or excess/ purulent secretions, ear ache,   fever, chills, sweats, unintended wt loss, pleuritic or exertional cp, hemoptysis, palpitations, orthopnea pnd .  Also denies presyncope, palpitations, heartburn, abdominal pain, anorexia, nausea, vomiting, diarrhea  or change in bowel or urinary habits, change in stools or  urine, dysuria,hematuria,  rash, arthralgias, visual complaints, headache, numbness weakness or ataxia or problems with walking or coordination. No noted change in mood/affect or memory.                        Objective:   Physical Exam   Thin amb wf 97 02/08/2012 > 03/24/2012  99 > 103 05/09/2012   HEENT mild turbinate edema.  Oropharynx no thrush or excess pnd or cobblestoning.  No JVD or cervical adenopathy. Mild accessory muscle hypertrophy. Trachea midline, nl thryroid. Chest was hyperinflated by percussion with diminished breath sounds and moderate increased exp time without wheeze. Hoover sign positive at mid inspiration. Regular rate and rhythm without murmur gallop or rub or increase P2  1-2 + Pitting edema L > R   Abd: no hsm, nl excursion. Ext warm without cyanosis or clubbing.    CXR  03/26/2012 :  Severe emphysematous and chronic bronchitic changes.      Assessment & Plan:

## 2012-05-09 NOTE — Assessment & Plan Note (Signed)

## 2012-05-12 ENCOUNTER — Ambulatory Visit: Payer: Medicare Other | Admitting: Adult Health

## 2012-05-20 ENCOUNTER — Encounter: Payer: Self-pay | Admitting: Internal Medicine

## 2012-05-20 DIAGNOSIS — J449 Chronic obstructive pulmonary disease, unspecified: Secondary | ICD-10-CM | POA: Diagnosis not present

## 2012-05-21 ENCOUNTER — Telehealth: Payer: Self-pay | Admitting: Internal Medicine

## 2012-05-21 NOTE — Telephone Encounter (Signed)
Per MW- ONO results on RA show that the pt needs to start using noct o2 at 2 lpm. She needs repeat ONO done on 2 lpm through [facility]. ATC and inform the pt. Line busy, WCB.

## 2012-05-23 NOTE — Telephone Encounter (Signed)
ATC the pt and line is still busy, Will try again next wk and if no luck will mail her letter

## 2012-05-27 ENCOUNTER — Ambulatory Visit (INDEPENDENT_AMBULATORY_CARE_PROVIDER_SITE_OTHER): Payer: Medicare Other | Admitting: Medical

## 2012-05-27 ENCOUNTER — Encounter: Payer: Self-pay | Admitting: *Deleted

## 2012-05-27 ENCOUNTER — Encounter: Payer: Self-pay | Admitting: Medical

## 2012-05-27 ENCOUNTER — Encounter: Payer: Self-pay | Admitting: Internal Medicine

## 2012-05-27 VITALS — BP 148/90 | HR 86 | Temp 98.1°F | Resp 16 | Wt 100.0 lb

## 2012-05-27 DIAGNOSIS — R609 Edema, unspecified: Secondary | ICD-10-CM | POA: Diagnosis not present

## 2012-05-27 DIAGNOSIS — R0602 Shortness of breath: Secondary | ICD-10-CM | POA: Diagnosis not present

## 2012-05-27 DIAGNOSIS — R0609 Other forms of dyspnea: Secondary | ICD-10-CM | POA: Diagnosis not present

## 2012-05-27 DIAGNOSIS — J449 Chronic obstructive pulmonary disease, unspecified: Secondary | ICD-10-CM | POA: Diagnosis not present

## 2012-05-27 DIAGNOSIS — R0989 Other specified symptoms and signs involving the circulatory and respiratory systems: Secondary | ICD-10-CM | POA: Diagnosis not present

## 2012-05-27 LAB — CBC
HCT: 43.5 % (ref 36.0–46.0)
Hemoglobin: 14.5 g/dL (ref 12.0–15.0)
MCH: 31.4 pg (ref 26.0–34.0)
MCHC: 33.3 g/dL (ref 30.0–36.0)
MCV: 94.2 fL (ref 78.0–100.0)
Platelets: 186 10*3/uL (ref 150–400)
RBC: 4.62 MIL/uL (ref 3.87–5.11)
RDW: 14.7 % (ref 11.5–15.5)
WBC: 6.4 10*3/uL (ref 4.0–10.5)

## 2012-05-27 LAB — BASIC METABOLIC PANEL
BUN: 14 mg/dL (ref 6–23)
CO2: 28 mEq/L (ref 19–32)
Calcium: 9.4 mg/dL (ref 8.4–10.5)
Chloride: 103 mEq/L (ref 96–112)
Creat: 0.69 mg/dL (ref 0.50–1.10)
Glucose, Bld: 104 mg/dL — ABNORMAL HIGH (ref 70–99)
Potassium: 4.1 mEq/L (ref 3.5–5.3)
Sodium: 144 mEq/L (ref 135–145)

## 2012-05-27 MED ORDER — HYDROCHLOROTHIAZIDE 25 MG PO TABS: ORAL_TABLET | ORAL | Status: DC

## 2012-05-27 NOTE — Progress Notes (Signed)
Subjective: Here for c/o swelling in ankles.  She reports ankle swelling daily, a little worse in the evenings for the past month.  Started taking Spiriva about the same time.  Has been seeing pulmonology Dr. Sherene Sires regarding her COPD/Emphysema, and was there recently and discussed the same concern.  She reports stopping spiriva for 2 weeks but continued to get ankle swelling during this time, and no real changes in her lung function.  She notes that her breathing is fair, no worse or better than usual.  She is currently only using her inhaler albuterol and Spiriva.  Not on oxygen currently.  Lives at home with husband, eats 3 meals daily including egg sandwich, donuts, but no increase in salt of recent.  Avoids salt in general.  No other aggravating or relieving factors.    No other c/o.  The following portions of the patient's history were reviewed and updated as appropriate: allergies, current medications, past family history, past medical history, past social history, past surgical history and problem list.  Past Medical History  Diagnosis Date  . Osteoporosis   . Tobacco use disorder   . COPD (chronic obstructive pulmonary disease)     Allergies  Allergen Reactions  . Levofloxacin    Review of Systems Constitutional: -fever, -chills, -sweats, -unexpected -weight change ENT: -runny nose, -ear pain, -sore throat Cardiology:  -chest pain, -palpitations Gastroenterology: -abdominal pain, -nausea, -vomiting, -diarrhea, -constipation  Musculoskeletal: -arthralgias, -myalgias, -joint swelling, -back pain Urology: -dysuria, -difficulty urinating, -hematuria, -urinary frequency, -urgency Neurology: -headache, -weakness, -tingling, -numbness      Objective:   Physical Exam  General appearance: alert, no distress, WD/WN, thin and frail white female HEENT: normocephalic, sclerae anicteric, TMs pearly, nares patent, no discharge or erythema, pharynx normal Oral cavity: MMM, no lesions Neck:  supple, no lymphadenopathy, no thyromegaly, no masses Heart: RRR, normal S1, S2, no murmurs Lungs: decreased breath sounds in general, few expiratory wheezes, no rhonchi or rales Abdomen: +bs, soft, non tender, non distended, no masses, no hepatomegaly, no splenomegaly Pulses: 1+ symmetric, upper and lower extremities, normal cap refill Skin: generalized erythema of distal fingers bilat, no cyanosis Ext: 1+ nonpitting ankle edema   Assessment and Plan :     Encounter Diagnoses  Name Primary?  . Edema Yes  . COPD (chronic obstructive pulmonary disease)   . Shortness of breath    Reviewed her recent several office notes with pulmonology Dr. Sherene Sires.  Her symptoms are suggestive of Cor Pulmonale as mentioned in Dr. Thurston Hole notes.  She just had an overnight oximetry and per pulmonology notes was to begin nighttime oxygen.  However, her phone has been disconnected the last 2 weeks.  I advised her to call Dr. Thurston Hole today to get the oxygen set up for night time use in the home.  This will hopefully improve some of her symptoms including SOB and edema. In addition, labs today, begin low dose HCTZ 25mg , 1/2-1 tablet daily for edema.  I will see her back in 2 weeks, sooner prn.

## 2012-05-27 NOTE — Patient Instructions (Signed)
Begin Hydrochlorothiazide 25mg , 1/2 - 1 tablet daily for ankle swelling.  Continue the Spiriva.    Call Dr. Thurston Hole office at [phone] regarding oxygen at night time.

## 2012-05-27 NOTE — Telephone Encounter (Signed)
ATC pt, NA and no option to leave msg, Will go ahead and mail letter for her to call for results.

## 2012-05-28 ENCOUNTER — Telehealth: Payer: Self-pay | Admitting: Internal Medicine

## 2012-05-28 DIAGNOSIS — J9610 Chronic respiratory failure, unspecified whether with hypoxia or hypercapnia: Secondary | ICD-10-CM

## 2012-05-28 DIAGNOSIS — J449 Chronic obstructive pulmonary disease, unspecified: Secondary | ICD-10-CM

## 2012-05-28 LAB — BRAIN NATRIURETIC PEPTIDE: Brain Natriuretic Peptide: 33.1 pg/mL (ref 0.0–100.0)

## 2012-05-28 NOTE — Telephone Encounter (Signed)
I spoke with pt and she states the company still has not received order for her ono on 2L. I advised pt order was just placed this AM and our [facility] will send it over to her DME company. She voiced her understanding and had no further questions

## 2012-05-28 NOTE — Telephone Encounter (Signed)
[name], [facility] 05/21/2012 12:23 PM Signed  Per MW- ONO results on RA show that the pt needs to start using noct o2 at 2 lpm. She needs repeat ONO done on 2 lpm through [facility].   I spoke with patient- she is aware that we are placing order to repeat ONO on 2lpm. Will advise her afterwards as far as need O2 set up/needs.

## 2012-05-29 ENCOUNTER — Telehealth: Payer: Self-pay | Admitting: Internal Medicine

## 2012-05-29 DIAGNOSIS — J449 Chronic obstructive pulmonary disease, unspecified: Secondary | ICD-10-CM

## 2012-05-29 NOTE — Telephone Encounter (Signed)
Reynaldo Minium, CMA 05/28/2012 8:59 AM Signed  Vernie Murders, CMA 05/21/2012 12:23 PM Signed  Per MW- ONO results on RA show that the pt needs to start using noct o2 at 2 lpm. She needs repeat ONO done on 2 lpm through [facility]   Pt needs order sent to be set up with oxygen at night--i have placed order

## 2012-06-02 ENCOUNTER — Telehealth: Payer: Self-pay | Admitting: Internal Medicine

## 2012-06-02 NOTE — Telephone Encounter (Signed)
LMTCB

## 2012-06-03 NOTE — Telephone Encounter (Signed)
error 

## 2012-06-10 ENCOUNTER — Ambulatory Visit (INDEPENDENT_AMBULATORY_CARE_PROVIDER_SITE_OTHER): Payer: Medicare Other | Admitting: Medical

## 2012-06-10 ENCOUNTER — Encounter: Payer: Self-pay | Admitting: Medical

## 2012-06-10 VITALS — BP 122/80 | HR 80 | Temp 98.0°F | Resp 16 | Wt 99.0 lb

## 2012-06-10 DIAGNOSIS — I279 Pulmonary heart disease, unspecified: Secondary | ICD-10-CM | POA: Diagnosis not present

## 2012-06-10 DIAGNOSIS — J449 Chronic obstructive pulmonary disease, unspecified: Secondary | ICD-10-CM

## 2012-06-10 DIAGNOSIS — I2781 Cor pulmonale (chronic): Secondary | ICD-10-CM

## 2012-06-10 DIAGNOSIS — M810 Age-related osteoporosis without current pathological fracture: Secondary | ICD-10-CM

## 2012-06-10 DIAGNOSIS — Z7189 Other specified counseling: Secondary | ICD-10-CM | POA: Diagnosis not present

## 2012-06-10 NOTE — Patient Instructions (Signed)
Begin a daily multivitamin OTC  Begin calcium 1500mg  + vitamin D 1000 IU daily OTC  Let me know if you are ok with restarting Fosamax for bone health.  Make sure your house is free from obstruction to avoid falls.  Check with [facility] to see if you had a pneumococcal vaccine.   Regarding the fluid pill, I would recommend you continue 1/2 tablet daily.

## 2012-06-10 NOTE — Progress Notes (Signed)
Subjective: Here for recheck.  I saw her 05/27/12 for edema.   Since then she began HCTZ 25mg , 1/2 tablet daily.  She notes much improvement on swelling, and seen improvement on breathing.  Since last visit she did get more manageable oxygen containers for QHS oxygen therapy through pulmonology and home health.  She has no new c/o today.  When I last saw her she was reporting ankle swelling daily, a little worse in the evenings for the past month.  She notes that her breathing was fair, no worse or better than usual.  She is currently only using her inhaler albuterol and Spiriva.  Lives at home with husband, eats 3 meals daily including egg sandwich, donuts, but no increase in salt of recent.  Avoids salt in general.  No other aggravating or relieving factors.    No other c/o.  The following portions of the patient's history were reviewed and updated as appropriate: allergies, current medications, past family history, past medical history, past social history, past surgical history and problem list.  Past Medical History  Diagnosis Date  . Osteoporosis   . Tobacco use disorder   . COPD (chronic obstructive pulmonary disease)     Allergies  Allergen Reactions  . Levofloxacin    Review of Systems Constitutional: -fever, -chills, -sweats, -unexpected -weight change ENT: -runny nose, -ear pain, -sore throat Cardiology:  -chest pain, -palpitations Gastroenterology: -abdominal pain, -nausea, -vomiting, -diarrhea, -constipation  Musculoskeletal: -arthralgias, -myalgias, -joint swelling, -back pain Urology: -dysuria, -difficulty urinating, -hematuria, -urinary frequency, -urgency Neurology: -headache, -weakness, -tingling, -numbness      Objective:   Physical Exam  General appearance: alert, no distress, WD/WN, thin and frail white female Neck: supple, no lymphadenopathy, no thyromegaly, no masses Heart: RRR, normal S1, S2, no murmurs Lungs: decreased breath sounds in general, few expiratory  wheezes, no rhonchi or rales Pulses: 1+ symmetric, upper and lower extremities, normal cap refill Ext: no edema   Assessment and Plan :     Encounter Diagnoses  Name Primary?  . Cor pulmonale Yes  . COPD (chronic obstructive pulmonary disease)   . Osteoporosis   . Counseling on health promotion and disease prevention    Cor pulmonale and SOB improved on HCTZ 25mg , 1/2 tablet daily.  Advised she use daily for edema for now, recheck BMET in 2-3 more weeks. Discussed possible side effects of HCTZ though, and call/return if concerns.  COPD - managed by pulmonology, on Spiriva and QHS oxygen, improved.    Osteoporosis - advised OTC daily Ca+VIt D, encouraged her to begin back on Fosamax.   Discussed repeat Bone Density scan.  Counseling - she declines most preventative care including mammogram, colonoscopy, routine labs, full exam, etc. Despite the recommendations.  She says she is happy, wants to just use the medications she is currently taking for her lungs and edema.  She does feel some improvement.  She had cut down tobacco to 5-6 cigarettes daily which is a big step for her.   RTC 3mo.

## 2012-06-12 ENCOUNTER — Ambulatory Visit (INDEPENDENT_AMBULATORY_CARE_PROVIDER_SITE_OTHER): Payer: Medicare Other | Admitting: Internal Medicine

## 2012-06-12 ENCOUNTER — Encounter: Payer: Self-pay | Admitting: Internal Medicine

## 2012-06-12 VITALS — BP 132/90 | HR 85 | Temp 97.9°F | Ht 67.0 in | Wt 99.4 lb

## 2012-06-12 DIAGNOSIS — F17200 Nicotine dependence, unspecified, uncomplicated: Secondary | ICD-10-CM | POA: Diagnosis not present

## 2012-06-12 DIAGNOSIS — J449 Chronic obstructive pulmonary disease, unspecified: Secondary | ICD-10-CM

## 2012-06-12 DIAGNOSIS — J9610 Chronic respiratory failure, unspecified whether with hypoxia or hypercapnia: Secondary | ICD-10-CM | POA: Diagnosis not present

## 2012-06-12 NOTE — Assessment & Plan Note (Signed)
-   ono RA 05/14/12 4 h 33 min < 89%   So ordered 02 2lpm    - HCO3  28  05/27/12   - Sat only 86% ra on arrival 06/12/2012   Not Adequate control on present rx, reviewed options, declined amb 02 (has been on it before didn't feel it helped and obviously will interfere with her smoking)

## 2012-06-12 NOTE — Assessment & Plan Note (Addendum)
-   PFTs 03/24/2012  FEV1  0.91 (43%) ratio 48 and no better p B2,  DLCO 38% corrects to 53%  - HFP 50% effective 05/09/2012 but having trouble triggering tudorza  > d/c  GOLD III with noct desats but nl hc03 doing better on noct 02 and declines daytime 02 at this point    Each maintenance medication was reviewed in detail including most importantly the difference between maintenance and as needed and under what circumstances the prns are to be used.  Please see instructions for details which were reviewed in writing and the patient given a copy.

## 2012-06-12 NOTE — Progress Notes (Signed)
Subjective:    Patient ID: CHIZARA MENA    DOB: 02/08/1932   MRN: [id number]  HPI  51 yowf active smoker referred 02/08/2012 by [name] for intermittent sob was on 02 but stopped in early 2013    02/08/2012 1st pulmonary ov/ cc "intermittently" bad breathing x 2-3 y  but on  best day can do  [employer] [place]  but not mall with episodes much worse last starting 3 weeks prior to OV  rx already by abx and prednisone and some better.  Assoc with congested cough slt discolored better p abx but really has year round am cough x years, worse in winter rec Moderate copd/ emphysema (not severe) I reviewed the Flethcher curve    Work on inhaler technique:  Late add:  Needs cxr on return  03/24/2012 f/u ov/Daevon Holdren cc doe not changed only  using albuterol but 4 x weekly, minimal am cough, mucoid. No active sinus or hb symptoms rec pt wants alternative to spiriva...feet and ankles swelling since beginning treatment 03/24/12 Pt stopped using 05/06/12   05/09/2012 f/u ov/Micheale Schlack cc leg swelling p first box of spiriva but did feel it helped breathing, swelling is worse on L sltly, better since held the spiriva. 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 No cough, overt sinus or reflux symptoms. rec Try tudorza one twice daily - if you don't see the green turn red it didn't work  Please see patient coordinator before you leave today  to schedule overnight oximetry on room air Please schedule a follow up office visit in 4 weeks, sooner if needed   06/12/2012 f/u ov/Maverick Dieudonne still smoking cc sleeping ok on 02 no am wakes up feeling rested no ha.  [place] [employer], usually does not use [employer] parking. No variability or overt sinus or hb symptoms or need for daytime saba.  Not interested in daytime 02 or committed to stop smoking at this point.  Likes spiriva > tudorza because not able to trigger the tudorza consistently.   Sleeping ok without nocturnal  or early am exacerbation  of respiratory  c/o's  or need for noct saba. Also denies any obvious fluctuation of symptoms with weather or environmental changes or other aggravating or alleviating factors except as outlined above.  ROS   At present no c/o sore throat, dysphagia, dental problems, itching, sneezing,  nasal congestion or excess/ purulent secretions, ear ache,   fever, chills, sweats, unintended wt loss, pleuritic or exertional cp, hemoptysis, palpitations, orthopnea pnd .  Also denies presyncope, palpitations, heartburn, abdominal pain, anorexia, nausea, vomiting, diarrhea  or change in bowel or urinary habits, change in stools or urine, dysuria,hematuria,  rash, arthralgias, visual complaints, headache, numbness weakness or ataxia or problems with walking or coordination. No noted change in mood/affect or memory.                  Objective:   Physical Exam   Thin amb wf 97 02/08/2012 > 03/24/2012  99 > 103 05/09/2012 > 06/12/2012  99  HEENT mild turbinate edema.  Oropharynx no thrush or excess pnd or cobblestoning.  No JVD or cervical adenopathy. Mild accessory muscle hypertrophy. Trachea midline, nl thryroid. Chest was hyperinflated by percussion with diminished breath sounds and moderate increased exp time without wheeze. Hoover sign positive at mid inspiration. Regular rate and rhythm without murmur gallop or rub or increase P2 edema resolved.  Abd: no hsm, nl excursion. Ext warm without cyanosis or clubbing.  CXR  03/26/2012 :  Severe emphysematous and chronic bronchitic changes.      Assessment & Plan:

## 2012-06-12 NOTE — Patient Instructions (Addendum)
The key is to stop smoking completely before smoking completely stops you!    Please schedule a follow up visit in 3 months but call sooner if needed  

## 2012-06-12 NOTE — Assessment & Plan Note (Signed)
>   3 min I reviewed the Flethcher curve with patient that basically indicates  if you quit smoking when your best day FEV1 is still well preserved (which hers is)  it is highly unlikely you will progress to severe disease and informed the patient there was no medication on the market that has proven to change the curve or the likelihood of progression.  Therefore stopping smoking and maintaining abstinence is the most important aspect of care, not choice of inhalers or for that matter, doctors.   

## 2012-07-02 ENCOUNTER — Encounter: Payer: Self-pay | Admitting: Medical

## 2012-07-02 ENCOUNTER — Ambulatory Visit (INDEPENDENT_AMBULATORY_CARE_PROVIDER_SITE_OTHER): Payer: Medicare Other | Admitting: Medical

## 2012-07-02 VITALS — BP 110/80 | HR 103 | Temp 97.7°F | Resp 18 | Wt 97.0 lb

## 2012-07-02 DIAGNOSIS — R300 Dysuria: Secondary | ICD-10-CM | POA: Diagnosis not present

## 2012-07-02 DIAGNOSIS — N390 Urinary tract infection, site not specified: Secondary | ICD-10-CM | POA: Diagnosis not present

## 2012-07-02 LAB — POCT URINALYSIS DIPSTICK
Nitrite, UA: POSITIVE
pH, UA: 5

## 2012-07-02 MED ORDER — SULFAMETHOXAZOLE-TRIMETHOPRIM 800-160 MG PO TABS: 1.0000 | ORAL_TABLET | Freq: Two times a day (BID) | ORAL | Status: DC

## 2012-07-02 NOTE — Progress Notes (Signed)
Subjective: Here for possible UTI.  Last UTI 1994.  She notes few day hx/o urinary frequency, burning with urination, mild back pain,urgency and lower abdominal pressure.  She doesn't drink a lot of water in general.  She did read on the Spiriva label that UTI could be side effect and she is worried about this.  Otherwise no nausea, vomiting, diarrhea, no URI symptoms, no chest pain, no fever, chills, or anorexia.  No other c/o.   Objective: Gen: wd, wn, nad GI: mild suprapubic tenderness, no organomegaly, +bs, soft Back: no cva tenderness  Color, UA dark yellow Clarity, UA cloudy Glucose, UA neg Bilirubin, UA neg Ketones, UA large Spec Grav, UA 1.010 Blood, UA large pH, UA 5.0 Protein, UA mod Urobilinogen, UA negative Nitrite, UA pos Leukocytes, UA large (3+)  Assessment: Encounter Diagnoses  Name Primary?  . UTI (lower urinary tract infection) Yes  . Dysuria    Plan: Script for Septra, hydrate well, will send for urine culture, but call/return if worsening, fever, nausea, or not improving.

## 2012-07-07 ENCOUNTER — Telehealth: Payer: Self-pay | Admitting: *Deleted

## 2012-07-07 ENCOUNTER — Telehealth: Payer: Self-pay | Admitting: Internal Medicine

## 2012-07-07 ENCOUNTER — Encounter: Payer: Self-pay | Admitting: Internal Medicine

## 2012-07-07 DIAGNOSIS — R9389 Abnormal findings on diagnostic imaging of other specified body structures: Secondary | ICD-10-CM | POA: Insufficient documentation

## 2012-07-07 NOTE — Telephone Encounter (Signed)
Spoke with pt and scheduled her for ov with cxr for 08-11-12 at 10:15 am.

## 2012-07-07 NOTE — Telephone Encounter (Signed)
Message copied by Christen Butter on Mon Jul 07, 2012  9:22 AM ------      Message from: Sandrea Hughs B      Created: Mon Jul 07, 2012  8:33 AM       Be sure we have her in for f/u cxr on next ov which should be w/in 6 weeks            ----- Message -----         From: Nyoka Cowden, MD         Sent: 03/25/2012   7:35 AM           To: Nyoka Cowden, MD            Recheck cxr (missed it on 3/28

## 2012-07-07 NOTE — Telephone Encounter (Signed)
LMTCB

## 2012-07-08 NOTE — Telephone Encounter (Signed)
Per last phone note: Message copied by Christen Butter on Mon Jul 07, 2012 9:22 AM  ------  Message from: Sandrea Hughs B  Created: Mon Jul 07, 2012 8:33 AM  Be sure we have her in for f/u cxr on next ov which should be w/in 6 weeks  ----- Message -----  From: Nyoka Cowden, MD  Sent: 03/25/2012 7:35 AM  To: Nyoka Cowden, MD  Recheck cxr (missed it on 3/28    Pt aware. Carron Curie, CMA

## 2012-07-11 ENCOUNTER — Encounter: Payer: Self-pay | Admitting: Medical

## 2012-07-11 ENCOUNTER — Ambulatory Visit (INDEPENDENT_AMBULATORY_CARE_PROVIDER_SITE_OTHER): Payer: Medicare Other | Admitting: Medical

## 2012-07-11 VITALS — BP 120/80 | HR 80 | Temp 97.9°F | Resp 18 | Wt 96.0 lb

## 2012-07-11 DIAGNOSIS — H6120 Impacted cerumen, unspecified ear: Secondary | ICD-10-CM

## 2012-07-11 DIAGNOSIS — H9190 Unspecified hearing loss, unspecified ear: Secondary | ICD-10-CM | POA: Diagnosis not present

## 2012-07-11 NOTE — Progress Notes (Signed)
Subjective: Here for hearing c/o.   Has hearing aids bilat, but in the last week has ringing in right ear, can't hear out of the left, and feels like in a tunnel.  Thinks it may be wax or a hearing aid problem.  Otherwise in normal state of health.  No numbness, tingling, weakness, fall, confusion, headache, vision changes, etc.   Past Medical History  Diagnosis Date  . Osteoporosis   . Tobacco use disorder   . COPD (chronic obstructive pulmonary disease)   . Cor pulmonale    Objective: Gen: pleasant, nad Right ear canal with impacted cerumen, left canal with moderate amount of cerumen.  Neuro: CN2-12 intact  Assessment: Encounter Diagnoses  Name Primary?  . Disturbed hearing Yes  . Impacted cerumen    Plan: Hearing screen abnormal bilat.  See results in documentation area.  After successfully removing cerumen bilat, she felt immediate improvement in the right side ringing and in her hearing in general.  Advised she contact her hearing aid company if she continues to feel problems with hearing, but it seems as though the cerumen was the only issue today.  F/u prn.

## 2012-08-11 ENCOUNTER — Encounter: Payer: Medicare Other | Admitting: Internal Medicine

## 2012-08-11 ENCOUNTER — Ambulatory Visit (INDEPENDENT_AMBULATORY_CARE_PROVIDER_SITE_OTHER)
Admission: RE | Admit: 2012-08-11 | Discharge: 2012-08-11 | Disposition: A | Discharge status: Home / Self Care | Payer: Medicare Other | Source: Ambulatory Visit | Attending: Internal Medicine | Admitting: Internal Medicine

## 2012-08-11 DIAGNOSIS — J449 Chronic obstructive pulmonary disease, unspecified: Secondary | ICD-10-CM

## 2012-08-11 NOTE — Progress Notes (Signed)
 This encounter was created in error - please disregard.

## 2012-08-12 NOTE — Progress Notes (Signed)
Quick Note:  Spoke with pt and notified of results per Dr. Wert. Pt verbalized understanding and denied any questions.  ______ 

## 2012-09-01 ENCOUNTER — Encounter: Payer: Self-pay | Admitting: Medical

## 2012-09-01 ENCOUNTER — Ambulatory Visit (INDEPENDENT_AMBULATORY_CARE_PROVIDER_SITE_OTHER): Payer: Medicare Other | Admitting: Medical

## 2012-09-01 VITALS — BP 112/80 | HR 79 | Temp 98.2°F | Resp 18 | Wt 96.0 lb

## 2012-09-01 DIAGNOSIS — R0902 Hypoxemia: Secondary | ICD-10-CM | POA: Diagnosis not present

## 2012-09-01 DIAGNOSIS — J40 Bronchitis, not specified as acute or chronic: Secondary | ICD-10-CM | POA: Diagnosis not present

## 2012-09-01 DIAGNOSIS — R062 Wheezing: Secondary | ICD-10-CM | POA: Diagnosis not present

## 2012-09-01 DIAGNOSIS — J449 Chronic obstructive pulmonary disease, unspecified: Secondary | ICD-10-CM | POA: Diagnosis not present

## 2012-09-01 MED ORDER — METHYLPREDNISOLONE SODIUM SUCC 125 MG IJ SOLR
125.0000 mg | Freq: Once | INTRAMUSCULAR | Status: AC
  Administered 2012-09-01: 125 mg via INTRAMUSCULAR

## 2012-09-01 MED ORDER — ALBUTEROL SULFATE (2.5 MG/3ML) 0.083% IN NEBU: 2.5000 mg | INHALATION_SOLUTION | RESPIRATORY_TRACT | Status: DC | PRN

## 2012-09-01 MED ORDER — ALBUTEROL SULFATE (2.5 MG/3ML) 0.083% IN NEBU: 2.5000 mg | INHALATION_SOLUTION | Freq: Four times a day (QID) | RESPIRATORY_TRACT | Status: DC | PRN

## 2012-09-01 MED ORDER — AMOXICILLIN-POT CLAVULANATE 875-125 MG PO TABS: 1.0000 | ORAL_TABLET | Freq: Two times a day (BID) | ORAL | Status: AC

## 2012-09-01 NOTE — Progress Notes (Signed)
Subjective:  [name] is a 78 y.o. female who presents for cough.  She notes 3 day hx/o wheezing, SOB, cough, but since yesterday having colored sputum.   Using her spiriva, but no other treatments for this.  Has albuterol inhaler but hasn't used it.   She has hx/o COPD, continues to smoke, sees Dr. Aeronautical engineer.   She has no other symptoms.  Denies sick contacts.  She normally has baseline SOB, dyspnea, uses Spiriva.   Her O2 sat typically runs 89-91% room air.  She is currently using oxygen 2L/min QHS only.  No other aggravating or relieving factors.  No other c/o.  The following portions of the patient's history were reviewed and updated as appropriate: allergies, current medications, past family history, past medical history, past social history, past surgical history and problem list.  Past Medical History  Diagnosis Date  . Osteoporosis   . Tobacco use disorder   . COPD (chronic obstructive pulmonary disease)   . Cor pulmonale    ROS: Gen: no fever, weight change HEENT: chest congestion, but no ear pain, ST, sinus pressure Heart: no CP, palpitations, edema Lungs: +SOB, wheezy, a little worse than normal GI: negative GU: negative Neuro: negative No recent long travel, recent injury, no hx/o DVT/ PE, not on HRT   Objective:   Filed Vitals:   09/01/12 1342  BP: 112/80  Pulse: 79  Temp: 98.2 F (36.8 C)  Resp: 18    General appearance: Alert, WD/WN, no obvious distress, but seems to have somewhat of pursed lip appearance, thin white female                             Skin: warm, no rash, no diaphoresis                           Head: no sinus tenderness                            Eyes: conjunctiva normal, corneas clear, PERRLA                            Ears: pearly TMs, external ear canals normal                          Nose: septum midline, turbinates normal appearing             Mouth/throat: MMM, tongue normal, mild pharyngeal erythema          Neck: supple, no adenopathy, no thyromegaly, nontender                          Heart: RRR, normal S1, S2, no murmurs                         Lungs: +decreased breath sounds, +scattered rhonchi, few scattered wheezes, no rales                Extremities: no edema, nontender     Assessment and Plan:   Encounter Diagnoses  Name Primary?  Marland Kitchen COPD (chronic obstructive pulmonary disease) Yes  . Hypoxia   . Bronchitis    Pulse ox 72%.   Began 1 round of albuterol by nebulizer.  Not much  improvement. Advised she go to the emergency dept given her symptoms and clinical impression.  Gave 2nd round of nebulizer and 125mg  SoluMedrol IM.   She declines ED evaluation.  Advised of possible differential for hypoxia.  Advised that I can't rule out other causes without additional eval.  She declines additional workup today such as CXR, EKG, labs.  She just wants to be treated for bronchitis.  i suspect her etiology is COPD flare/bronchitis, but can't rule out other as noted above.  Will have her begin Augmentin, rest, hydrate well, called out nebulizer and albuterol to home health to deliver today. increase her home oxygen to round the clock until I see her back.  She normally has been using QHS only, 2L/min.  recheck 2 days, but if not improving or worse, go to the ED.  She voices understanding of this plan.

## 2012-09-01 NOTE — Patient Instructions (Addendum)
I am treating you for a COPD flare today.  Begin Augmentin antibiotic, twice daily for ten days.  Rest   Drink plenty of water.  Use your home oxygen round the clock for the next several days until much improved.  Use your regular medications as usual.   Increase your albuterol inhaler use to 2 puffs every 4-6 hours OR use the albuterol nebulizer that we are calling out to home health today.     I want to see you back in 2 days to recheck.  If worse in the meantime, call 911 or go to the emergency dept.   We did give you a shot of Solu Medrol steroid today.

## 2012-09-03 ENCOUNTER — Encounter: Payer: Self-pay | Admitting: Medical

## 2012-09-03 ENCOUNTER — Ambulatory Visit (INDEPENDENT_AMBULATORY_CARE_PROVIDER_SITE_OTHER): Payer: Medicare Other | Admitting: Medical

## 2012-09-03 VITALS — BP 110/80 | HR 112 | Temp 98.4°F | Resp 18

## 2012-09-03 DIAGNOSIS — J441 Chronic obstructive pulmonary disease with (acute) exacerbation: Secondary | ICD-10-CM

## 2012-09-03 DIAGNOSIS — R0902 Hypoxemia: Secondary | ICD-10-CM | POA: Diagnosis not present

## 2012-09-03 NOTE — Progress Notes (Signed)
Subjective:  [name] is a 78 y.o. female who presents for recheck.   I saw her 2 days ago for COPD flare up, productive sputum, 73% pulse oximetry, dyspnea. We started her on Augmentin, gave IM solumedrol, and set her up for home nebulizer of albuterol.  She is currently taking the Augmentin, using albuterol nebulized every 4 hours, is using Oxygen 2L/min round the clock, and feeling much improved.  She is resting, hydrating well.  No other c/o.  No fever, sinus pressure, ear pain, sore throat, and cough is improved.  No other aggravating or relieving factors.  No other c/o.  The following portions of the patient's history were reviewed and updated as appropriate: allergies, current medications, past family history, past medical history, past social history, past surgical history and problem list.  Past Medical History  Diagnosis Date  . Osteoporosis   . Tobacco use disorder   . COPD (chronic obstructive pulmonary disease)   . Cor pulmonale    ROS: Gen: no fever, weight change HEENT: chest congestion, but no ear pain, ST, sinus pressure Heart: no CP, palpitations, edema Lungs: mild SOB GI: negative GU: negative Neuro: negative  Objective:   Filed Vitals:   09/03/12 1420  BP: 110/80  Pulse: 112  Temp: 98.4 F (36.9 C)  Resp: 18    General appearance: Alert, WD/WN, no obvious distress                             Skin: warm, no rash, no diaphoresis                           Head: no sinus tenderness                            Eyes: conjunctiva normal, corneas clear, PERRLA                            Ears: pearly TMs, external ear canals normal                          Nose: septum midline, turbinates normal appearing             Mouth/throat: MMM, tongue normal, mild pharyngeal erythema                           Neck: supple, no adenopathy, no thyromegaly, nontender                          Heart: RRR, normal S1, S2, no murmurs                         Lungs:  +decreased breath sounds, no rhonchi, wheezes or rales                Extremities: no edema, nontender     Assessment and Plan:   Encounter Diagnoses  Name Primary?  Marland Kitchen COPD exacerbation Yes  . Hypoxia    Pulse ox 87% today.   Overall improved.  C/t Augmentin, Oxygen 2L/min round the clock, rest, hydrate well, but by Friday afternoon in 2 days if doing much better, then start to wean down to oxygen QHS only and  start to wean down to albuterol nebulized every 6 hours to BID or prn by the weekend.   If not back to baseline by early next week, let us know.

## 2012-09-03 NOTE — Patient Instructions (Signed)
Finish Augmentin antibiotics  Continue albuterol nebulizer every 4-6 hours and oxygen 24 hours daily through Friday.   By Friday afternoon, if feeling continual improvement, then you can cut back on oxygen to nighttime and as needed.  Similarly, you can start cutting back on aluboterol to twice daily or as needed.  Continue spiriva.  Rest, don't do anything real active.   continue to drink more water than usual.     If worse, fever, not improving, then call or return.

## 2012-09-09 ENCOUNTER — Telehealth: Payer: Self-pay | Admitting: Medical

## 2012-09-10 ENCOUNTER — Telehealth: Payer: Self-pay | Admitting: Medical

## 2012-09-10 NOTE — Telephone Encounter (Signed)
At this point, she should have taken med for 9 days, only 1 day left, so I don't recommend changing.  (unless she truly hasn't been taking it, due to diarrhea).  Probiotics can help resolve the diarrhea faster.  Recommend Align, or other OTC probiotic

## 2012-09-10 NOTE — Telephone Encounter (Signed)
She needs to complete the entire course of ABX. Probiotics might help reduce some of the diarrhea as a side effect.  If she doesn't want to resume the augmentin, then must take an additional 1 week of Septra DS BID (#14)

## 2012-09-10 NOTE — Telephone Encounter (Signed)
Spoke with patient and she stated that she would get a probiotic and resume the rest of the augmentin as opposed to starting another abx(Septra DS BID x 7days).

## 2012-09-10 NOTE — Telephone Encounter (Signed)
Spoke with patient, she has 8 pills left of the Augmentin-she stopped taking this past Saturday or Sunday, she doesn't remember. I did recommend she take some probiotics. Just wanted to double check you and see if you wanted to call something else in as she had stopped taking. Patient states she is feeling somewhat better, not coughing as bad but still coughing. She is using her nebulizer and inhaler.

## 2012-09-11 ENCOUNTER — Ambulatory Visit: Payer: Medicare Other | Admitting: Internal Medicine

## 2012-09-16 NOTE — Telephone Encounter (Signed)
Is she doing much better now?   Feeling back to baseline?  She can keep the nebulizer and albuterol liquid for future as needed use as far as I am concerned unless home health needed an end date to pickup nebulizer.  She will likely need this again at some point.   She can just use it as needed, not daily or on a routine schedule.

## 2012-09-16 NOTE — Telephone Encounter (Signed)
I SPOKE WITH THE PATIENT AND SHE SAID  THAT SHE WAS FEELING MUCH BETTER. I TOLD HER SHANE TYSINGER'S RECOMMENDATIONS AS FAR AS THE NEBULIZER. PATIENT SAID THANKS FOR CALLING. CLS

## 2012-09-17 ENCOUNTER — Telehealth: Payer: Self-pay | Admitting: Medical

## 2012-09-19 NOTE — Telephone Encounter (Signed)
I called and I spoke with the pharmacists at [facility] and they state that the patient requested that the medication for the nebulizer come from her requesting this and that is the reason why she has the extra medication. I called and relayed the message to [place]. I explain to her that she should not receive any more unless she calls and request it. CLS

## 2012-09-24 ENCOUNTER — Encounter: Payer: Self-pay | Admitting: Internal Medicine

## 2012-09-24 ENCOUNTER — Ambulatory Visit (INDEPENDENT_AMBULATORY_CARE_PROVIDER_SITE_OTHER): Payer: Medicare Other | Admitting: Internal Medicine

## 2012-09-24 VITALS — BP 110/70 | HR 86 | Temp 98.4°F | Ht 67.0 in | Wt 96.8 lb

## 2012-09-24 DIAGNOSIS — J449 Chronic obstructive pulmonary disease, unspecified: Secondary | ICD-10-CM | POA: Diagnosis not present

## 2012-09-24 DIAGNOSIS — J9610 Chronic respiratory failure, unspecified whether with hypoxia or hypercapnia: Secondary | ICD-10-CM

## 2012-09-24 DIAGNOSIS — F17200 Nicotine dependence, unspecified, uncomplicated: Secondary | ICD-10-CM | POA: Diagnosis not present

## 2012-09-24 DIAGNOSIS — R918 Other nonspecific abnormal finding of lung field: Secondary | ICD-10-CM

## 2012-09-24 DIAGNOSIS — R9389 Abnormal findings on diagnostic imaging of other specified body structures: Secondary | ICD-10-CM

## 2012-09-24 NOTE — Patient Instructions (Addendum)
Work on inhaler technique:  relax and gently blow all the way out then take a nice smooth deep breath back in, triggering the inhaler at same time you start breathing in.  Hold for up to 5 seconds if you can.  Rinse and gargle with water when done   If your mouth or throat starts to bother you,   I suggest you time the inhaler to your dental care and after using the inhaler(s) brush teeth and tongue with a baking soda containing toothpaste and when you rinse this out, gargle with it first to see if this helps your mouth and throat.   The key is to stop smoking completely before smoking completely stops you!      If you are satisfied with your treatment plan let your doctor know and he/she can either refill your medications or you can return here when your prescription runs out.     If in any way you are not 100% satisfied,  please tell us.  If 100% better, tell your friends!

## 2012-09-24 NOTE — Assessment & Plan Note (Signed)

## 2012-09-24 NOTE — Assessment & Plan Note (Signed)
-   ono RA 05/14/12 4 h 33 min < 89%   So ordered 02 2lpm    - HCO3  28  05/27/12  Adequate control on present rx, reviewed

## 2012-09-24 NOTE — Assessment & Plan Note (Signed)
See outside film 01/21/12 > cxr 08/11/12 ok > no further f/u planned

## 2012-09-24 NOTE — Progress Notes (Signed)
Subjective:    Patient ID: [name]    DOB: 11-Nov-1932   MRN: [id number]  HPI  79 yowf active smoker referred 02/08/2012 by [name] for intermittent sob was on 02 but stopped in early 2013    02/08/2012 1st pulmonary ov/ cc "intermittently" bad breathing x 2-3 y  but on  best day can do  [employer] full [place]isles  but not mall with episodes much worse last starting 3 weeks prior to OV  rx already by abx and prednisone and some better.  Assoc with congested cough slt discolored better p abx but really has year round am cough x years, worse in winter rec Moderate copd/ emphysema (not severe) I reviewed the Flethcher curve    Work on inhaler technique:  Late add:  Needs cxr on return  03/24/2012 f/u ov/[name] cc doe not changed only  using albuterol but 4 x weekly, minimal am cough, mucoid. No active sinus or hb symptoms rec pt wants alternative to spiriva...feet and ankles swelling since beginning treatment 03/24/12 Pt stopped using 05/06/12   05/09/2012 f/u ov/[name] cc leg swelling p first box of spiriva but did feel it helped breathing, swelling is worse on L sltly, better since held the spiriva. 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 No cough, overt sinus or reflux symptoms. rec Try tudorza one twice daily - if you don't see the green turn red it didn't work  Please see patient coordinator before you leave today  to schedule overnight oximetry on room air Please schedule a follow up office visit in 4 weeks, sooner if needed   06/12/2012 f/u ov/[name] still smoking cc sleeping ok on 02 no am wakes up feeling rested no ha.  [place] x > [employer], usually does not use HC parking. No variability or overt sinus or hb symptoms or need for daytime saba.  Not interested in daytime 02 or committed to stop smoking at this point. Likes spiriva > tudorza because not able to trigger the tudorza consistently. rec The key is to stop smoking completely before smoking completely stops you - no  change in meds    08/11/2012 f/u ov/[name] cc missed appt  09/24/2012 f/u ov/[name] still smoking cc doe x ok at [employer] pushing cart, No obvious daytime variabilty or assoc chronic cough or cp or chest tightness, subjective wheeze overt sinus or hb symptoms. No unusual exp hx .   Sleeping ok on 2lpm without nocturnal  or early am exacerbation  of respiratory  c/o's or need for noct saba. Also denies any obvious fluctuation of symptoms with weather or environmental changes or other aggravating or alleviating factors except as outlined above.  ROS  The following are not active complaints unless bolded sore throat, dysphagia, dental problems, itching, sneezing,  nasal congestion or excess/ purulent secretions, ear ache,   fever, chills, sweats, unintended wt loss, pleuritic or exertional cp, hemoptysis,  orthopnea pnd or leg swelling, presyncope, palpitations, heartburn, abdominal pain, anorexia, nausea, vomiting, diarrhea  or change in bowel or urinary habits, change in stools or urine, dysuria,hematuria,  rash, arthralgias, visual complaints, headache, numbness weakness or ataxia or problems with walking or coordination,  change in mood/affect or memory.                      Objective:   Physical Exam   Thin amb wf 97 02/08/2012 > 03/24/2012  99 > 103 05/09/2012 > 06/12/2012  99 >   09/24/2012  96  HEENT mild turbinate edema.  Oropharynx no thrush or excess pnd or cobblestoning.  No JVD or cervical adenopathy. Mild accessory muscle hypertrophy. Trachea midline, nl thryroid. Chest was hyperinflated by percussion with diminished breath sounds and moderate increased exp time without wheeze. Hoover sign positive at mid inspiration. Regular rate and rhythm without murmur gallop or rub or increase P2 edema resolved.  Abd: no hsm, nl excursion. Ext warm without cyanosis or clubbing.    cxr 08/11/12 Stable COPD. No active disease.       Assessment & Plan:

## 2012-09-24 NOTE — Assessment & Plan Note (Addendum)
-   PFTs 03/24/2012  FEV1  0.91 (43%) ratio 48 and no better p B2,  DLCO 38% corrects to 53%  - HFP 50% effective 05/09/2012 but having trouble triggering tudorza  > d/c  GOLD III severe copd/ noct 02 dep doing relatively well on spiriva > no change rx x needs to quit smoking  The proper method of use, as well as anticipated side effects, of a metered-dose inhaler are discussed and demonstrated to the patient. Improved effectiveness after extensive coaching during this visit to a level of approximately  50%, no  Better - luckily does not have much of an asthmatic, bronchitic component > reviewed need to use neb if not responding to hfa saba and call me for early re-eval

## 2012-10-07 ENCOUNTER — Other Ambulatory Visit: Payer: Medicare Other

## 2012-10-07 DIAGNOSIS — Z23 Encounter for immunization: Secondary | ICD-10-CM

## 2012-10-07 MED ORDER — INFLUENZA VIRUS VACC SPLIT PF IM SUSP: 0.5000 mL | Freq: Once | INTRAMUSCULAR | Status: DC

## 2012-10-24 ENCOUNTER — Telehealth: Payer: Self-pay | Admitting: Internal Medicine

## 2012-10-24 ENCOUNTER — Ambulatory Visit (INDEPENDENT_AMBULATORY_CARE_PROVIDER_SITE_OTHER): Payer: Medicare Other | Admitting: Medical

## 2012-10-24 ENCOUNTER — Encounter: Payer: Self-pay | Admitting: Medical

## 2012-10-24 VITALS — BP 118/80 | HR 84 | Temp 97.8°F | Resp 18 | Wt 96.0 lb

## 2012-10-24 DIAGNOSIS — J449 Chronic obstructive pulmonary disease, unspecified: Secondary | ICD-10-CM

## 2012-10-24 NOTE — Progress Notes (Signed)
Subjective: Here for form completion for [employer] for oxygen.  However, she is managed by Dr. Sherene Sires now for COPD.   He prescribed her oxygen. Thus, [facility] was advised and will send forms to Dr. Rolin Barry office.  No other c/o.  Objective: Gen: lean white female, frail  Assessment: Encounter Diagnosis  Name Primary?  Marland Kitchen COPD (chronic obstructive pulmonary disease) Yes    Plan: Called [facility] and pulmonology to get this straightened out.   She really didn't need an appt today.  No charge for today.

## 2012-10-24 NOTE — Telephone Encounter (Signed)
I spoke with nurse from Dr. Jola Babinski office and she states they have been receiving cmns from [facility] for this pt but it looks like MW ordered oxygen at night. They are going to call [facility] and have them send the forms to Dr. Sherene Sires. Carron Curie, CMA

## 2012-10-30 ENCOUNTER — Telehealth: Payer: Self-pay | Admitting: Internal Medicine

## 2012-10-30 NOTE — Telephone Encounter (Signed)
I spoke with [place] at [facility] and she states the pt needs a face to face between 10-23-12 and 01-21-12 for nocturnal oxygen. Pt does not need an ONO pr [place]. I spoke with Almyra Free because I did not feel this was correct information and she advised to send message to [facility] and they will contact Okey Regal at [facility] in the AM and discuss. Carron Curie, CMA

## 2012-10-31 NOTE — Telephone Encounter (Signed)
Medicare guidelines her recert date was 10/20/12 to 01/20/13 so she will have to be seen before 01/20/13 [name]

## 2012-11-03 NOTE — Telephone Encounter (Signed)
Pt aware and has been schedule for follow-up with MW on 01/06/12 @11am .

## 2012-11-10 ENCOUNTER — Encounter: Payer: Self-pay | Admitting: Medical

## 2012-11-12 ENCOUNTER — Encounter: Payer: Self-pay | Admitting: Internal Medicine

## 2012-12-09 ENCOUNTER — Ambulatory Visit (INDEPENDENT_AMBULATORY_CARE_PROVIDER_SITE_OTHER): Payer: Medicare Other | Admitting: Medical

## 2012-12-09 ENCOUNTER — Encounter: Payer: Self-pay | Admitting: Medical

## 2012-12-09 VITALS — BP 120/80 | HR 110 | Temp 98.1°F | Resp 20 | Wt 95.0 lb

## 2012-12-09 DIAGNOSIS — J449 Chronic obstructive pulmonary disease, unspecified: Secondary | ICD-10-CM | POA: Diagnosis not present

## 2012-12-09 DIAGNOSIS — R300 Dysuria: Secondary | ICD-10-CM

## 2012-12-09 DIAGNOSIS — N390 Urinary tract infection, site not specified: Secondary | ICD-10-CM | POA: Diagnosis not present

## 2012-12-09 LAB — POCT URINALYSIS DIPSTICK
Spec Grav, UA: 1.01
Urobilinogen, UA: NEGATIVE

## 2012-12-09 MED ORDER — SULFAMETHOXAZOLE-TRIMETHOPRIM 800-160 MG PO TABS: 1.0000 | ORAL_TABLET | Freq: Two times a day (BID) | ORAL | Status: DC

## 2012-12-09 NOTE — Progress Notes (Signed)
Subjective:  [name] is a 78 y.o. female who c/o 2-3 days of burning with urination, urinary urgency, low back pain, pressure in pelvis and this is same symptoms as her last UTI.   Also has a little bit of cough and congestion.  She notes having leftover bactrim from last visit, didn't use it all, so started this today.  No fever, nausea, vomiting, diarrhea.  Not eating much due to no appetite.  Drinking some water.  Past Medical History  Diagnosis Date  . Osteoporosis   . Tobacco use disorder   . COPD (chronic obstructive pulmonary disease)   . Cor pulmonale    ROS as in HPI  Objective:   Filed Vitals:   12/09/12 1520  BP: 120/80  Pulse: 110  Temp: 98.1 F (36.7 C)  Resp: 20    General appearance: alert, no distress Oral cavity: somewhat dry MM Heart: tachycardic, otherwise RRR, normal S1, S2, no murmurs Lungs: decreased breath sounds, no wheezes, rhonchi, or rales Abdomen: +bs, soft, non tender, non distended, no masses, no hepatomegaly, no splenomegaly, no bruits Back: no CVA tenderness     Laboratory:  Color, UA yellow Clarity, UA hazy Glucose, UA neg Bilirubin, UA neg Ketones, UA large Spec Grav, UA 1.010 Blood, UA large pH, UA 5.0 Protein, UA mod Urobilinogen, UA negative Nitrite, UA positive Leukocytes, UA large (3+)    Assessment and Plan:   Encounter Diagnoses  Name Primary?  . Dysuria Yes  . UTI (lower urinary tract infection)   . COPD (chronic obstructive pulmonary disease)    Begin Bactrim.  Advised to complete the course of antibiotics compared to last time.  Don't stop when symptoms improve, finish the antibiotic.  significantly increase water intake to flush the kidney and due to being a little dehydrated.  Discussed the needs to get reasonable nutrition, rest, c/t copd medications.  Discussed urine findings. If not improving in 2-3 days, call or return.   If improving as discussed, will need to repeat urinalysis in 2wk.  Urine culture sent today.

## 2012-12-11 ENCOUNTER — Telehealth: Payer: Self-pay | Admitting: Internal Medicine

## 2012-12-11 DIAGNOSIS — H40019 Open angle with borderline findings, low risk, unspecified eye: Secondary | ICD-10-CM | POA: Diagnosis not present

## 2012-12-11 LAB — URINE CULTURE
Colony Count: NO GROWTH
Organism ID, Bacteria: NO GROWTH

## 2012-12-11 MED ORDER — ALBUTEROL SULFATE HFA 108 (90 BASE) MCG/ACT IN AERS: 2.0000 | INHALATION_SPRAY | RESPIRATORY_TRACT | Status: DC | PRN

## 2012-12-11 NOTE — Telephone Encounter (Signed)
Proventil renewed 

## 2012-12-15 ENCOUNTER — Ambulatory Visit (INDEPENDENT_AMBULATORY_CARE_PROVIDER_SITE_OTHER): Payer: Medicare Other | Admitting: Family Medicine

## 2012-12-15 ENCOUNTER — Encounter: Payer: Self-pay | Admitting: Family Medicine

## 2012-12-15 VITALS — BP 110/70 | HR 112 | Temp 97.7°F | Ht 67.0 in | Wt 95.0 lb

## 2012-12-15 DIAGNOSIS — F17200 Nicotine dependence, unspecified, uncomplicated: Secondary | ICD-10-CM

## 2012-12-15 DIAGNOSIS — R0602 Shortness of breath: Secondary | ICD-10-CM

## 2012-12-15 DIAGNOSIS — J449 Chronic obstructive pulmonary disease, unspecified: Secondary | ICD-10-CM | POA: Diagnosis not present

## 2012-12-15 DIAGNOSIS — N390 Urinary tract infection, site not specified: Secondary | ICD-10-CM

## 2012-12-15 DIAGNOSIS — R000 Tachycardia, unspecified: Secondary | ICD-10-CM | POA: Diagnosis not present

## 2012-12-15 NOTE — Patient Instructions (Addendum)
Check again paper from pharmacy to find a date for pneumonia vaccine.  Continue your antibiotic until it is finished.  Continue your spiriva daily, and use the albuterol as needed for shortness of breath

## 2012-12-15 NOTE — Progress Notes (Signed)
Chief Complaint  Patient presents with  . Shortness of Breath    started yesterday, worsened today. No other symptoms.   Yesterday and today she "just didn't feel right".  She feels short of breath with exertion.  Using Spiriva daily, and needed to use her albuterol this morning.  It seemed to improve her breathing.  Denies chest pain. She feels like "she should feel better" than she does.  Seen last week with urinary symptoms, as well as cough and congestion, decreased appetite. Diagnosed with UTI, treated with Bactrim.  Culture was negative, and she was feeling better. Culture was taken after she had already taken some leftover Bactrim she had.  She is still taking the Bactrim.  Mucus/phlem was discolored last week, is now clear, and feeling better.  Denies dysuria, flank pain.  Denies nausea, vomiting, diarrhea.  No interest/not ready to quit smoking.   Last CXR was in August, stable COPD.  Under care of Dr. Sherene Sires, last seen in pulm clinic last month.  Immunizations--flu shot UTD.  She knows she had pneumovax through pharmacy, has paper she was given, but doesn't recall seeing date on it.  Past Medical History  Diagnosis Date  . Osteoporosis   . Tobacco use disorder   . COPD (chronic obstructive pulmonary disease)   . Cor pulmonale    No past surgical history on file. History   Social History  . Marital Status: Married    Spouse Name: N/A    Number of Children: 1  . Years of Education: N/A   Occupational History  . Retired     Teacher, music    Social History Main Topics  . Smoking status: Current Every Day Smoker -- 0.3 packs/day for 50 years    Types: Cigarettes  . Smokeless tobacco: Never Used  . Alcohol Use: No  . Drug Use: No  . Sexually Active: Not on file   Other Topics Concern  . Not on file   Social History Narrative  . No narrative on file   Current outpatient prescriptions:albuterol (PROVENTIL HFA;VENTOLIN HFA) 108 (90 BASE) MCG/ACT inhaler, Inhale 2 puffs  into the lungs every 4 (four) hours as needed., Disp: 1 Inhaler, Rfl: 0;  Multiple Vitamin (MULTIVITAMIN) capsule, Take 1 capsule by mouth daily., Disp: , Rfl: ;  sulfamethoxazole-trimethoprim (BACTRIM DS,SEPTRA DS) 800-160 MG per tablet, Take 1 tablet by mouth 2 (two) times daily., Disp: 20 tablet, Rfl: 0 tiotropium (SPIRIVA HANDIHALER) 18 MCG inhalation capsule, Place 1 capsule (18 mcg total) into inhaler and inhale daily., Disp: 30 capsule, Rfl: 11;  calcium-vitamin D (OSCAL WITH D) 250-125 MG-UNIT per tablet, Take 1 tablet by mouth daily., Disp: , Rfl: ;  hydrochlorothiazide (HYDRODIURIL) 25 MG tablet, 1/2-1 tablet daily for swelling, Disp: 30 tablet, Rfl: 1 No current facility-administered medications for this visit. Facility-Administered Medications Ordered in Other Visits: influenza  inactive virus vaccine (FLUZONE/FLUARIX) injection 0.5 mL, 0.5 mL, Intramuscular, Once, Ronnald Nian, MD Allergies  Allergen Reactions  . Levofloxacin    ROS:  Denies fevers, URI symptoms, stable/improved cough, mild.  Denies nausea, vomiting, diarrhea, skin rash.  Denies chest pain.  +DOE.  Denies headaches, dizziness, neuro complaints, skin rash or other concerns.  See HPI.  Denies dysuria, incontinence.  PHYSICAL EXAM: BP 110/70  Pulse 112  Temp 97.7 F (36.5 C) (Oral)  Ht 5\' 7"  (1.702 m)  Wt 95 lb (43.092 kg)  BMI 14.88 kg/m2  SpO2 90%  Pleasant female, speaking easily, in no distress, occasional cough. Pulse  ox initially was 80%, tachycardic at 115.  With rest for just a minute, pulse ox up to 90%. Eventually pulse slowed down to approx 90. Cachectic/thin HEENT:  PERRL, EOMI, TM's and EAC's normal.  OP-- no thrush or excess pnd or cobblestoning. Neck: No JVD or cervical adenopathy. Mild accessory muscle hypertrophy. Trachea midline, nl thyroid.  Lungs: diminished breath sounds and moderate increased exp time without wheeze. Lungs are clear without wheezes, rales, ronchi. Heart: Regular rate and  rhythm without murmur, gallop Abdomen: soft, nontender, no mass.  Extremities:  warm without cyanosis or clubbing.  Skin: no rash  EKG: NSR, biatrial enlargement.  Rate 89 Urine dip: 1+ leuks  ASSESSMENT/PLAN:  1. SOB (shortness of breath)  PR ELECTROCARDIOGRAM, COMPLETE  2. SMOKER    3. COPD (chronic obstructive pulmonary disease)    4. Tachycardia    5. UTI (lower urinary tract infection)     clinically improved.  culture neg, but already on ABX.  abnl dip today.  complete course of ABX and repeat urine as scheduled next wk (cx if ua abnl, off ABX)    Finish ABX course.  Covers UTI (which has clinically resolved with ABX), as well as COPD exacerbation, also clinically improved from last week.  ?cause for increased DOE x 2 days.  Likely related to COPD, as responds to inhaler. Use albuterol as needed.  Discussed labs--declines today.  Consider CBC if continues to feel bad.  Urine dip today--still abnormal.  Still on ABX, so can't send for culture.  Clinically, symptoms have improved with ABX.  Recheck next week (and if urine dip is abnormal, can then send for culture, as she will be off ABX).  Check again paper from pharmacy to find a date for pneumonia vaccine, so we know when to give booster.  Encouraged her to quit smoking, discussed available resources to help with quitting, if/when interested.

## 2012-12-16 LAB — POCT URINALYSIS DIPSTICK
Bilirubin, UA: NEGATIVE
Glucose, UA: NEGATIVE
Nitrite, UA: NEGATIVE
Spec Grav, UA: 1.02
Urobilinogen, UA: NEGATIVE

## 2012-12-16 NOTE — Addendum Note (Signed)
Addended by: Debbrah Alar F on: 12/16/2012 08:06 AM   Modules accepted: Orders

## 2012-12-19 DIAGNOSIS — H4011X0 Primary open-angle glaucoma, stage unspecified: Secondary | ICD-10-CM | POA: Diagnosis not present

## 2012-12-19 DIAGNOSIS — H409 Unspecified glaucoma: Secondary | ICD-10-CM | POA: Diagnosis not present

## 2012-12-22 ENCOUNTER — Encounter: Payer: Self-pay | Admitting: Medical

## 2012-12-22 ENCOUNTER — Ambulatory Visit (INDEPENDENT_AMBULATORY_CARE_PROVIDER_SITE_OTHER): Payer: Medicare Other | Admitting: Medical

## 2012-12-22 VITALS — BP 120/78 | HR 90 | Temp 97.6°F | Resp 18 | Wt 93.0 lb

## 2012-12-22 DIAGNOSIS — R630 Anorexia: Secondary | ICD-10-CM

## 2012-12-22 DIAGNOSIS — J449 Chronic obstructive pulmonary disease, unspecified: Secondary | ICD-10-CM | POA: Diagnosis not present

## 2012-12-22 DIAGNOSIS — R300 Dysuria: Secondary | ICD-10-CM

## 2012-12-22 LAB — POCT URINALYSIS DIPSTICK
Glucose, UA: NEGATIVE
Ketones, UA: NEGATIVE
Leukocytes, UA: NEGATIVE
Spec Grav, UA: 1.015
Urobilinogen, UA: NEGATIVE

## 2012-12-23 ENCOUNTER — Encounter: Payer: Self-pay | Admitting: Medical

## 2012-12-23 NOTE — Progress Notes (Signed)
Subjective: Here for f/u.   I saw her 12/09/12 for UTI symptoms, then she came back in 12/15/12 for UTI and shortness of breath.  She has hx/o COPD, has declined routine preventative care, labs and other evaluation prior other than care regarding the COPD.  She has completed all but 2 days of bactrim due to diarrhea.  She currently feels fine other than a little weak.  She hasn't been eating due to drinking so much water, reducing her appetite.  No new c/o.  No interest/not ready to quit smoking.  Last CXR was in August, stable COPD.  Under care of Dr. Sherene Sires, last seen in pulm clinic last month.  Immunizations--flu shot UTD.  She knows she had pneumovax through pharmacy, has paper she was given, but doesn't recall seeing date on it.  Past Medical History  Diagnosis Date  . Osteoporosis   . Tobacco use disorder   . COPD (chronic obstructive pulmonary disease)   . Cor pulmonale    ROS otherwise unremarkable  Gen: Cachectic/thin, NAD HEENT:  PERRL, EOMI, TM's and EAC's normal.  OP-- no thrush or excess pnd or cobblestoning. Neck: No JVD or cervical adenopathy. Mild accessory muscle hypertrophy. Trachea midline, nl thyroid.  Lungs: diminished breath sounds and moderate increased exp time without wheeze. Lungs are clear without wheezes, rales, rhonchi. Heart: Regular rate and rhythm without murmur, gallop Abdomen: soft, nontender, no mass.  Extremities:  warm without cyanosis or clubbing.  Skin: no rash   ASSESSMENT/PLAN:  1. Dysuria  Urinalysis Dipstick  2. COPD (chronic obstructive pulmonary disease)    3. Anorexia     At this point, stop Bactrim.  Seems stable today with her COPD, but she has lost 3 pounds since 12/15/12.  Advised she focus on getting at least 3 meals daily.  I want to watch her weight closer.  Advised weight check in 7-10 days.

## 2012-12-29 ENCOUNTER — Telehealth: Payer: Self-pay | Admitting: Family Medicine

## 2012-12-29 NOTE — Telephone Encounter (Signed)
Message copied by Janeice Robinson on Mon Dec 29, 2012 10:37 AM ------      Message from: Jac Canavan      Created: Sat Dec 27, 2012  7:18 AM       Have her come in for no charge weight check sometime the end of this week and make sure I see the weight.

## 2012-12-29 NOTE — Telephone Encounter (Signed)
Patient was made aware that Kristian Covey PA-C would like her to stop by the office for a weight check only. She agreed and will stop by. CLS

## 2012-12-31 ENCOUNTER — Other Ambulatory Visit: Payer: Medicare Other

## 2013-01-05 ENCOUNTER — Encounter: Payer: Self-pay | Admitting: Internal Medicine

## 2013-01-05 ENCOUNTER — Telehealth: Payer: Self-pay | Admitting: Internal Medicine

## 2013-01-05 ENCOUNTER — Ambulatory Visit (INDEPENDENT_AMBULATORY_CARE_PROVIDER_SITE_OTHER): Payer: Medicare Other | Admitting: Internal Medicine

## 2013-01-05 VITALS — BP 118/80 | HR 84 | Temp 97.2°F | Ht 67.0 in | Wt 98.0 lb

## 2013-01-05 DIAGNOSIS — J4489 Other specified chronic obstructive pulmonary disease: Secondary | ICD-10-CM

## 2013-01-05 DIAGNOSIS — J9610 Chronic respiratory failure, unspecified whether with hypoxia or hypercapnia: Secondary | ICD-10-CM | POA: Diagnosis not present

## 2013-01-05 DIAGNOSIS — J449 Chronic obstructive pulmonary disease, unspecified: Secondary | ICD-10-CM | POA: Diagnosis not present

## 2013-01-05 DIAGNOSIS — F17200 Nicotine dependence, unspecified, uncomplicated: Secondary | ICD-10-CM

## 2013-01-05 NOTE — Assessment & Plan Note (Signed)
-   ono RA 05/14/12   4 h 33 min < 89%   So ordered 02 2lpm    - HCO3  28  05/27/12   - 01/05/2013   Walked RA x one lap @ 185 stopped due to  88%   - 01/05/2013  Walked 2lpm 2 laps @ 185 ft each stopped due to  90% sob but improved vs baseline  Rx= 02 2lpm baseline and walking outside of home

## 2013-01-05 NOTE — Telephone Encounter (Signed)
This has been added to her medication list

## 2013-01-05 NOTE — Assessment & Plan Note (Signed)
-   PFTs 03/24/2012  FEV1  0.91 (43%) ratio 48 and no better p B2,  DLCO 38% corrects to 53%  GOLD III and still smoking discussed separately    Each maintenance medication was reviewed in detail including most importantly the difference between maintenance and as needed and under what circumstances the prns are to be used.  Please see instructions for details which were reviewed in writing and the patient given a copy.  The proper method of use, as well as anticipated side effects, of a metered-dose inhaler are discussed and demonstrated to the patient. Improved effectiveness after extensive coaching during this visit to a level of approximately  50% so should use hfa first then neb second if hfa not effective  See instructions for specific recommendations which were reviewed directly with the patient who was given a copy with highlighter outlining the key components.

## 2013-01-05 NOTE — Assessment & Plan Note (Signed)
Advised re danger of smoking with 02 use.

## 2013-01-05 NOTE — Patient Instructions (Addendum)
Change 02 regimen:  2lpm baseline and walking outside of home   Please see patient coordinator before you leave today  to schedule   Plan A  Continue Spriva one capsule daily each am and stop smoking  Only use your albuterol (plan B proaire, plan C is the nebulizer)  as a rescue medication to be used if you can't catch your breath by resting or doing a relaxed purse lip breathing pattern. The less you use it, the better it will work when you need it. Ok to use up to every 4 hours.  For cough/ congestion >  mucinex 600 mg 1-2 every 12 hours as needed  Call back with name of neb

## 2013-01-05 NOTE — Progress Notes (Signed)
Subjective:    Patient ID: [name]    DOB: 24-Mar-1932   MRN: [id number]  HPI  61 yowf active smoker referred 02/08/2012 by [name] for intermittent sob was on 02 but stopped in early 2013    02/08/2012 1st pulmonary ov/ cc "intermittently" bad breathing x 2-3 y  but on  best day can do  [employer] full [place]  but not mall with episodes much worse last starting 3 weeks prior to OV  rx already by abx and prednisone and some better.  Assoc with congested cough slt discolored better p abx but really has year round am cough x years, worse in winter rec Moderate copd/ emphysema (not severe) I reviewed the Flethcher curve    Work on inhaler technique:  Late add:  Needs cxr on return  03/24/2012 f/u ov/[name] cc doe not changed only  using albuterol but 4 x weekly, minimal am cough, mucoid. No active sinus or hb symptoms rec pt wants alternative to spiriva...feet and ankles swelling since beginning treatment 03/24/12 Pt stopped using 05/06/12   05/09/2012 f/u ov/[name] cc leg swelling p first box of spiriva but did feel it helped breathing, swelling is worse on L sltly, better since held the spiriva. 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 No cough, overt sinus or reflux symptoms. rec Try tudorza one twice daily - if you don't see the green turn red it didn't work  Please see patient coordinator before you leave today  to schedule overnight oximetry on room air Please schedule a follow up office visit in 4 weeks, sooner if needed   06/12/2012 f/u ov/[name] still smoking cc sleeping ok on 02 no am wakes up feeling rested no ha.  Doe x > [employer], usually does not use HC parking. No variability or overt sinus or hb symptoms or need for daytime saba.  Not interested in daytime 02 or committed to stop smoking at this point. Likes spiriva > tudorza because not able to trigger the tudorza consistently. rec The key is to stop smoking completely before smoking completely stops you - no  change in meds    08/11/2012 f/u ov/[name] cc missed appt  09/24/2012 f/u ov/[name] still smoking cc doe x ok at [employer] pushing cart rec Work on inhaler technique/ f/u prn   01/05/2013 f/u ov/[name] cc no change in breathing although had aecopd and was provided with nebulizer but no clearly what she's using in it >  back to baseline doe ([employer] shopping)and using hfa twice daily and neb once daily. Has congested cough, still smoking, no purulent sputum.   No obvious daytime variabilty or assoc  cp or chest tightness, subjective wheeze overt sinus or hb symptoms. No unusual exp hx .   Sleeping ok on 2lpm without nocturnal  or early am exacerbation  of respiratory  c/o's or need for noct saba. Also denies any obvious fluctuation of symptoms with weather or environmental changes or other aggravating or alleviating factors except as outlined above.  ROS  The following are not active complaints unless bolded sore throat, dysphagia, dental problems, itching, sneezing,  nasal congestion or excess/ purulent secretions, ear ache,   fever, chills, sweats, unintended wt loss, pleuritic or exertional cp, hemoptysis,  orthopnea pnd or leg swelling, presyncope, palpitations, heartburn, abdominal pain, anorexia, nausea, vomiting, diarrhea  or change in bowel or urinary habits, change in stools or urine, dysuria,hematuria,  rash, arthralgias, visual complaints, headache, numbness weakness or ataxia or problems with  walking or coordination,  change in mood/affect or memory.                      Objective:   Physical Exam   Thin amb wf 97 02/08/2012 > 03/24/2012  99 > 103 05/09/2012 > 06/12/2012  99 >   09/24/2012  96 > 98 01/05/2013  Congested rattling cough HEENT mild turbinate edema.  Oropharynx no thrush or excess pnd or cobblestoning.  No JVD or cervical adenopathy. Mild accessory muscle hypertrophy. Trachea midline, nl thryroid. Chest was hyperinflated by percussion with diminished breath sounds and  moderate increased exp time without wheeze. Hoover sign positive at mid inspiration. Regular rate and rhythm without murmur gallop or rub or increase P2 edema resolved.  Abd: no hsm, nl excursion. Ext warm without cyanosis or clubbing.    cxr 08/11/12 Stable COPD. No active disease.       Assessment & Plan:

## 2013-01-08 ENCOUNTER — Other Ambulatory Visit: Payer: Self-pay | Admitting: Internal Medicine

## 2013-01-08 ENCOUNTER — Telehealth: Payer: Self-pay | Admitting: Internal Medicine

## 2013-01-08 NOTE — Telephone Encounter (Signed)
Info [email or web address]  was waiting for with mw's signature faxed to [facility]

## 2013-01-19 DIAGNOSIS — L819 Disorder of pigmentation, unspecified: Secondary | ICD-10-CM | POA: Diagnosis not present

## 2013-01-19 DIAGNOSIS — L570 Actinic keratosis: Secondary | ICD-10-CM | POA: Diagnosis not present

## 2013-01-19 DIAGNOSIS — L821 Other seborrheic keratosis: Secondary | ICD-10-CM | POA: Diagnosis not present

## 2013-01-19 DIAGNOSIS — L578 Other skin changes due to chronic exposure to nonionizing radiation: Secondary | ICD-10-CM | POA: Diagnosis not present

## 2013-01-24 DIAGNOSIS — R0902 Hypoxemia: Secondary | ICD-10-CM

## 2013-01-24 HISTORY — DX: Hypoxemia: R09.02

## 2013-02-03 ENCOUNTER — Telehealth: Payer: Self-pay | Admitting: Internal Medicine

## 2013-02-03 NOTE — Telephone Encounter (Signed)
I called and spoke with [name]) she stated she is not sure why pt was not set up with conserving device. She stated she will have this fixed for pt.   I called and made pt aware of this. She voiced her understanding and needed nothing further

## 2013-02-12 ENCOUNTER — Telehealth: Payer: Self-pay | Admitting: Internal Medicine

## 2013-02-13 ENCOUNTER — Other Ambulatory Visit: Payer: Self-pay | Admitting: Medical

## 2013-02-13 MED ORDER — ALBUTEROL SULFATE HFA 108 (90 BASE) MCG/ACT IN AERS: 2.0000 | INHALATION_SPRAY | RESPIRATORY_TRACT | Status: DC | PRN

## 2013-02-13 NOTE — Telephone Encounter (Signed)
done

## 2013-02-27 DIAGNOSIS — R0602 Shortness of breath: Secondary | ICD-10-CM | POA: Diagnosis not present

## 2013-02-28 ENCOUNTER — Inpatient Hospital Stay (HOSPITAL_COMMUNITY)
Admission: EM | Admit: 2013-02-28 | Discharge: 2013-03-02 | DRG: 189 | Disposition: A | Discharge status: Home / Self Care | Payer: Medicare Other | Attending: Internal Medicine | Admitting: Internal Medicine

## 2013-02-28 ENCOUNTER — Emergency Department (HOSPITAL_COMMUNITY): Payer: Medicare Other

## 2013-02-28 ENCOUNTER — Encounter (HOSPITAL_COMMUNITY): Payer: Self-pay | Admitting: *Deleted

## 2013-02-28 DIAGNOSIS — J9620 Acute and chronic respiratory failure, unspecified whether with hypoxia or hypercapnia: Secondary | ICD-10-CM | POA: Diagnosis not present

## 2013-02-28 DIAGNOSIS — J449 Chronic obstructive pulmonary disease, unspecified: Secondary | ICD-10-CM

## 2013-02-28 DIAGNOSIS — I279 Pulmonary heart disease, unspecified: Secondary | ICD-10-CM | POA: Diagnosis present

## 2013-02-28 DIAGNOSIS — R062 Wheezing: Secondary | ICD-10-CM | POA: Diagnosis not present

## 2013-02-28 DIAGNOSIS — M810 Age-related osteoporosis without current pathological fracture: Secondary | ICD-10-CM | POA: Diagnosis present

## 2013-02-28 DIAGNOSIS — R05 Cough: Secondary | ICD-10-CM | POA: Diagnosis not present

## 2013-02-28 DIAGNOSIS — Z66 Do not resuscitate: Secondary | ICD-10-CM | POA: Diagnosis present

## 2013-02-28 DIAGNOSIS — R0902 Hypoxemia: Secondary | ICD-10-CM | POA: Diagnosis present

## 2013-02-28 DIAGNOSIS — F17200 Nicotine dependence, unspecified, uncomplicated: Secondary | ICD-10-CM | POA: Diagnosis not present

## 2013-02-28 DIAGNOSIS — Z720 Tobacco use: Secondary | ICD-10-CM

## 2013-02-28 DIAGNOSIS — R059 Cough, unspecified: Secondary | ICD-10-CM | POA: Diagnosis not present

## 2013-02-28 DIAGNOSIS — Z79899 Other long term (current) drug therapy: Secondary | ICD-10-CM

## 2013-02-28 DIAGNOSIS — R9389 Abnormal findings on diagnostic imaging of other specified body structures: Secondary | ICD-10-CM

## 2013-02-28 DIAGNOSIS — J441 Chronic obstructive pulmonary disease with (acute) exacerbation: Secondary | ICD-10-CM | POA: Diagnosis present

## 2013-02-28 DIAGNOSIS — J9610 Chronic respiratory failure, unspecified whether with hypoxia or hypercapnia: Secondary | ICD-10-CM

## 2013-02-28 DIAGNOSIS — R0602 Shortness of breath: Secondary | ICD-10-CM | POA: Diagnosis not present

## 2013-02-28 DIAGNOSIS — N289 Disorder of kidney and ureter, unspecified: Secondary | ICD-10-CM | POA: Diagnosis present

## 2013-02-28 DIAGNOSIS — Z9981 Dependence on supplemental oxygen: Secondary | ICD-10-CM

## 2013-02-28 HISTORY — DX: Unspecified asthma, uncomplicated: J45.909

## 2013-02-28 LAB — BLOOD GAS, ARTERIAL
Acid-Base Excess: 4.9 mmol/L — ABNORMAL HIGH (ref 0.0–2.0)
Bicarbonate: 32.4 mEq/L — ABNORMAL HIGH (ref 20.0–24.0)
TCO2: 28.8 mmol/L (ref 0–100)
pCO2 arterial: 62.5 mmHg (ref 35.0–45.0)

## 2013-02-28 LAB — CBC WITH DIFFERENTIAL/PLATELET
Basophils Absolute: 0 10*3/uL (ref 0.0–0.1)
Eosinophils Absolute: 0.2 10*3/uL (ref 0.0–0.7)
Eosinophils Relative: 2 % (ref 0–5)
HCT: 45.6 % (ref 36.0–46.0)
Lymphs Abs: 0.7 10*3/uL (ref 0.7–4.0)
MCH: 30.8 pg (ref 26.0–34.0)
MCV: 94.2 fL (ref 78.0–100.0)
Monocytes Absolute: 0.3 10*3/uL (ref 0.1–1.0)
Platelets: 180 10*3/uL (ref 150–400)
RDW: 15.6 % — ABNORMAL HIGH (ref 11.5–15.5)

## 2013-02-28 LAB — COMPREHENSIVE METABOLIC PANEL
AST: 33 U/L (ref 0–37)
Albumin: 3.9 g/dL (ref 3.5–5.2)
Alkaline Phosphatase: 59 U/L (ref 39–117)
Chloride: 99 mEq/L (ref 96–112)
Potassium: 3.7 mEq/L (ref 3.5–5.1)
Sodium: 143 mEq/L (ref 135–145)
Total Bilirubin: 0.4 mg/dL (ref 0.3–1.2)

## 2013-02-28 LAB — TROPONIN I: Troponin I: <0.3 ng/mL (ref <0.30)

## 2013-02-28 MED ORDER — ACETAMINOPHEN 325 MG PO TABS: 650.0000 mg | ORAL_TABLET | Freq: Four times a day (QID) | ORAL | Status: DC | PRN

## 2013-02-28 MED ORDER — HYDROCODONE-ACETAMINOPHEN 5-325 MG PO TABS: 1.0000 | ORAL_TABLET | ORAL | Status: DC | PRN

## 2013-02-28 MED ORDER — ACETAMINOPHEN 650 MG RE SUPP: 650.0000 mg | Freq: Four times a day (QID) | RECTAL | Status: DC | PRN

## 2013-02-28 MED ORDER — IPRATROPIUM BROMIDE 0.02 % IN SOLN
0.5000 mg | Freq: Three times a day (TID) | RESPIRATORY_TRACT | Status: DC
  Administered 2013-03-01: 0.5 mg via RESPIRATORY_TRACT
  Filled 2013-02-28: qty 2.5

## 2013-02-28 MED ORDER — ONDANSETRON HCL 4 MG/2ML IJ SOLN: 4.0000 mg | Freq: Four times a day (QID) | INTRAMUSCULAR | Status: DC | PRN

## 2013-02-28 MED ORDER — MULTIVITAMINS PO CAPS: 1.0000 | ORAL_CAPSULE | Freq: Every day | ORAL | Status: DC

## 2013-02-28 MED ORDER — IPRATROPIUM BROMIDE 0.02 % IN SOLN
0.5000 mg | RESPIRATORY_TRACT | Status: DC
  Administered 2013-02-28: 0.5 mg via RESPIRATORY_TRACT
  Filled 2013-02-28: qty 2.5

## 2013-02-28 MED ORDER — ALBUTEROL SULFATE (5 MG/ML) 0.5% IN NEBU
2.5000 mg | INHALATION_SOLUTION | RESPIRATORY_TRACT | Status: DC
  Administered 2013-02-28: 2.5 mg via RESPIRATORY_TRACT
  Filled 2013-02-28: qty 0.5

## 2013-02-28 MED ORDER — ALBUTEROL SULFATE (5 MG/ML) 0.5% IN NEBU
2.5000 mg | INHALATION_SOLUTION | RESPIRATORY_TRACT | Status: DC | PRN
  Filled 2013-02-28: qty 0.5

## 2013-02-28 MED ORDER — ADULT MULTIVITAMIN W/MINERALS CH
1.0000 | ORAL_TABLET | Freq: Every day | ORAL | Status: DC
Start: 2013-03-01 — End: 2013-03-02
  Administered 2013-03-01 – 2013-03-02 (×2): 1 via ORAL
  Filled 2013-02-28 (×2): qty 1

## 2013-02-28 MED ORDER — SODIUM CHLORIDE 0.9 % IV SOLN
INTRAVENOUS | Status: DC
  Administered 2013-02-28 – 2013-03-02 (×3): via INTRAVENOUS

## 2013-02-28 MED ORDER — ALBUTEROL SULFATE (5 MG/ML) 0.5% IN NEBU
2.5000 mg | INHALATION_SOLUTION | Freq: Three times a day (TID) | RESPIRATORY_TRACT | Status: DC
  Administered 2013-03-01: 2.5 mg via RESPIRATORY_TRACT
  Filled 2013-02-28: qty 0.5

## 2013-02-28 MED ORDER — AZITHROMYCIN 500 MG PO TABS
500.0000 mg | ORAL_TABLET | Freq: Every day | ORAL | Status: DC
  Administered 2013-02-28 – 2013-03-02 (×3): 500 mg via ORAL
  Filled 2013-02-28 (×3): qty 1

## 2013-02-28 MED ORDER — ONDANSETRON HCL 4 MG PO TABS: 4.0000 mg | ORAL_TABLET | Freq: Four times a day (QID) | ORAL | Status: DC | PRN

## 2013-02-28 MED ORDER — ALBUTEROL (5 MG/ML) CONTINUOUS INHALATION SOLN
10.0000 mg/h | INHALATION_SOLUTION | Freq: Once | RESPIRATORY_TRACT | Status: AC
  Administered 2013-02-28: 10 mg/h via RESPIRATORY_TRACT
  Filled 2013-02-28: qty 20

## 2013-02-28 MED ORDER — SODIUM CHLORIDE 0.9 % IJ SOLN
3.0000 mL | Freq: Two times a day (BID) | INTRAMUSCULAR | Status: DC
  Administered 2013-02-28 – 2013-03-01 (×2): 3 mL via INTRAVENOUS

## 2013-02-28 MED ORDER — HEPARIN SODIUM (PORCINE) 5000 UNIT/ML IJ SOLN
5000.0000 [IU] | Freq: Three times a day (TID) | INTRAMUSCULAR | Status: DC
  Administered 2013-02-28 – 2013-03-02 (×5): 5000 [IU] via SUBCUTANEOUS
  Filled 2013-02-28 (×8): qty 1

## 2013-02-28 MED ORDER — METHYLPREDNISOLONE SODIUM SUCC 125 MG IJ SOLR
60.0000 mg | Freq: Four times a day (QID) | INTRAMUSCULAR | Status: DC
  Administered 2013-02-28 – 2013-03-01 (×3): 60 mg via INTRAVENOUS
  Filled 2013-02-28 (×7): qty 0.96

## 2013-02-28 NOTE — H&P (Signed)
[facility] History and Physical  [name] ZOX:[id number] DOB: [birth year] DOA: 02/28/2013   PCP: Carollee Herter, MD   Chief Complaint: SOB  HPI:  79 year old female with a history of COPD, osteoporosis, cor pulmonale, and continued tobacco use presents with a one-week history of progressive shortness of breath. The patient states that during this past week, she was able to use her nebulizer which gave her some relief. However, on her electricity went out yesterday, the patient was not able to use her nebulizer. As a result, the patient's shortness of breath progressively got worse. During this past week, the patient has noted increasing cough, mostly nonproductive. She denies any hemoptysis, fevers, chills, chest discomfort, dizziness, syncope, nausea, vomiting, diarrhea. Her oral intake has been decreased to do to her shortness of breath. The patient continues to smoke, although she has not smoked for the past 3 days. She continues to smoke 2-10 cigarettes per day. She has been using her inhalers on her nebulizer did not work, but this did not give her significant relief. As a result EMS was activated.  At baseline, the patient uses 2 L nasal cannula when she is at rest, and 4 L when she is ambulating. At baseline, the patient states that she gets short of breath with minimal exertion just walking to the bathroom and around the house. Her oxygen saturation is around 92% on 2 L at rest at home. In the past week, she has noted increasing nocturnal awakenings due to shortness of breath. She denies any orthopnea, increasing edema of her abdomen or legs.  In emergency department, the patient received a one hour ago nebulizer. As a result, the patient states that her breathing is 25-50% better. Labs include WBC 8.6, BUN 18, creatinine 0.63. EKG shows sinus tachycardia without any ST-T wave changes with right axis deviation. Her oxygen saturation was 83% on arrival. She was placed on  BiPAP temporarily, but she has been weaned off by the time I evaluated the patient. She has had over 50-pack-year history of tobacco. At the time of my evaluation, the patient was 91% on 4 L without acute distress. Assessment/Plan: Acute on chronic respiratory failure -Secondary to acute exacerbation COPD COPD exacerbation -Albuterol and Atrovent nebulizer -Intravenous Solu-Medrol--unfortunately, the patient did not receive a dose in the emergency department -Pulmonary toilet -Start azithromycin -Patient is allergic to Levaquin -ABG shows 7.33/62/65/32 on 3 L Tobacco abuse -Tobacco cessation discussed Renal insufficiency -Creatinine clearance is approximately 55 -Judicious fluids  CODE STATUS -Discussed with the patient and husband, and they confirmed DO NOT RESUSCITATE status       Past Medical History  Diagnosis Date  . Osteoporosis   . Tobacco use disorder   . COPD (chronic obstructive pulmonary disease)   . Cor pulmonale   . Asthma    History reviewed. No pertinent past surgical history. Social History:  reports that she has been smoking Cigarettes.  She has a 15 pack-year smoking history. She has never used smokeless tobacco. She reports that she does not drink alcohol or use illicit drugs.   Family History  Problem Relation Age of Onset  . Diabetes Mother   . Arthritis Father   . Stroke Father   . Colon cancer Mother      Allergies  Allergen Reactions  . Levofloxacin Other (See Comments)    Lowered blood pressre      Prior to Admission medications   Medication Sig Start Date End Date Taking? Authorizing Provider  albuterol (PROVENTIL HFA;VENTOLIN  HFA) 108 (90 BASE) MCG/ACT inhaler Inhale 2 puffs into the lungs every 4 (four) hours as needed. 02/13/13  Yes Kermit Balo Tysinger, PA-C  albuterol (PROVENTIL) (2.5 MG/3ML) 0.083% nebulizer solution Take 2.5 mg by nebulization every 6 (six) hours as needed.   Yes Historical Provider, MD  calcium-vitamin D (OSCAL  WITH D) 250-125 MG-UNIT per tablet Take 1 tablet by mouth daily.   Yes Historical Provider, MD  Multiple Vitamin (MULTIVITAMIN) capsule Take 1 capsule by mouth daily.   Yes Historical Provider, MD  tiotropium (SPIRIVA HANDIHALER) 18 MCG inhalation capsule Place 1 capsule (18 mcg total) into inhaler and inhale daily. 03/24/12 03/24/13 Yes Nyoka Cowden, MD    Review of Systems:  Constitutional:  No weight loss, night sweats, Fevers, chills, fatigue.  Head&Eyes: No headache.  No vision loss.  No eye pain or scotoma ENT:  No Difficulty swallowing,Tooth/dental problems,Sore throat,  No ear ache, post nasal drip,  Cardio-vascular:  No chest pain, Orthopnea, PND, swelling in lower extremities,  dizziness, palpitations  GI:  No  abdominal pain, nausea, vomiting, diarrhea, loss of appetite, hematochezia, melena, heartburn, indigestion, Resp:  Complains of nonproductive cough and progressive shortness breath Skin:  no rash or lesions.  GU:  no dysuria, change in color of urine, no urgency or frequency. No flank pain.  Musculoskeletal:  No joint pain or swelling. No decreased range of motion. No back pain.  Psych:  No change in mood or affect. No depression or anxiety. Neurologic: No headache, no dysesthesia, no focal weakness, no vision loss. No syncope  Physical Exam: Filed Vitals:   02/28/13 1230 02/28/13 1300 02/28/13 1430 02/28/13 1504  BP:    118/67  Pulse: 128 111 111 124  Temp:    97.5 F (36.4 C)  TempSrc:    Oral  Resp: 33  22 31  SpO2: 90% 99% 90% 87%   General:  A&O x 3, NAD, nontoxic, pleasant/cooperative Head/Eye: No conjunctival hemorrhage, no icterus, [place]/AT, No nystagmus ENT:  No icterus,  No thrush, poor dentition, no pharyngeal exudate Neck:  No masses, no lymphadenpathy, no bruits CV:  RRR, no rub, no gallop, no S3--tachycardic Lung:  Moderate expiratory wheeze at the bases. No wheezes rhonchi. Abdomen: soft/NT, +BS, nondistended, no peritoneal signs Ext: No  cyanosis, No rashes, No petechiae, No lymphangitis, No edema   Labs on Admission:  Basic Metabolic Panel:  Recent Labs Lab 02/28/13 1117  NA 143  K 3.7  CL 99  CO2 34*  GLUCOSE 155*  BUN 18  CREATININE 0.63  CALCIUM 9.5   Liver Function Tests:  Recent Labs Lab 02/28/13 1117  AST 33  ALT 35  ALKPHOS 59  BILITOT 0.4  PROT 7.2  ALBUMIN 3.9   No results found for this basename: LIPASE, AMYLASE,  in the last 168 hours No results found for this basename: AMMONIA,  in the last 168 hours CBC:  Recent Labs Lab 02/28/13 1117  WBC 8.6  NEUTROABS 7.4  HGB 14.9  HCT 45.6  MCV 94.2  PLT 180   Cardiac Enzymes:  Recent Labs Lab 02/28/13 1117  TROPONINI <0.30   BNP: No components found with this basename: POCBNP,  CBG: No results found for this basename: GLUCAP,  in the last 168 hours  Radiological Exams on Admission: Dg Chest Port 1 View  02/28/2013  *RADIOLOGY REPORT*  Clinical Data: Shortness of breath.  History of asthma.  PORTABLE CHEST - 1 VIEW  Comparison: Chest rate 19,013.  Findings: Lungs appear  hyperexpanded with flattening of the hemidiaphragms, increased retrosternal air space and pruning of the pulmonary vasculature in the periphery, suggestive of underlying COPD.  No acute consolidative airspace disease.  No definite pleural effusions (chronic blunting of the costophrenic sulci is most compatible with chronic pleural parenchymal scarring and similar priors).  No evidence of pulmonary edema.  Heart size is normal. The patient is rotated to the left on today's exam, resulting in distortion of the mediastinal contours and reduced diagnostic sensitivity and specificity for mediastinal pathology. Atherosclerosis of the thoracic aorta.  IMPRESSION: 1.  Chronic changes of COPD redemonstrated, without radiographic evidence of acute cardiopulmonary disease. 2.  Atherosclerosis.   Original Report Authenticated By: Trudie Reed, M.D.     EKG: Independently reviewed.  Sinus tach, RAD, no ST-T change    Time spent:70 minutes Code Status:   DNR Family Communication:   Husband at bedside   TAT, DAVID, DO  [facility] Pager [phone]  If 7PM-7AM, please contact night-coverage [email or web address] Password [facility] 02/28/2013, 3:56 PM

## 2013-02-28 NOTE — ED Notes (Signed)
Put patient on the bedpan but she was unable to do anything.

## 2013-02-28 NOTE — ED Notes (Signed)
Patient eating ice I will return to do vitals when finish

## 2013-02-28 NOTE — ED Notes (Signed)
XBJ:[id number]<NF> Expected date:02/28/13<BR> Expected time:10:14 AM<BR> Means of arrival:Ambulance<BR> Comments:<BR> Breathing diff

## 2013-02-28 NOTE — ED Notes (Signed)
Per ems: pt from home, hx of COPD and asthma, current smoker. Has not had power x1.5 days, has not been able to use neb treatments or home oxygen. House temperature 40 degrees per ems, pt very cold upon ems arrival. Expiratory wheezing and rhonchi. ems gave total of 10mg  albuteral and 1mg  atrovent, and 125mg  solumedrol. saO2 90% on 4L [place]

## 2013-02-28 NOTE — ED Provider Notes (Signed)
History     CSN: [id number]  Arrival date & time 02/28/13  1031   First MD Initiated Contact with Patient 02/28/13 1042      Chief Complaint  Patient presents with  . Shortness of Breath    (Consider location/radiation/quality/duration/timing/severity/associated sxs/prior treatment) HPI Pt with history of COPD on 2-4 liters of oxygen with increased SOB x 2 days since power has been out. No space heater use. +cough that is not productive. No lower ext swelling or pain. No fever. No chest pain.   Past Medical History  Diagnosis Date  . Osteoporosis   . Tobacco use disorder   . COPD (chronic obstructive pulmonary disease)   . Cor pulmonale   . Asthma     History reviewed. No pertinent past surgical history.  Family History  Problem Relation Age of Onset  . Diabetes Mother   . Arthritis Father   . Stroke Father   . Colon cancer Mother     History  Substance Use Topics  . Smoking status: Current Every Day Smoker -- 0.30 packs/day for 50 years    Types: Cigarettes  . Smokeless tobacco: Never Used  . Alcohol Use: No    OB History   Grav Para Term Preterm Abortions TAB SAB Ect Mult Living                  Review of Systems  Constitutional: Negative for fever and chills.  Respiratory: Positive for cough, shortness of breath and wheezing.   Cardiovascular: Negative for chest pain, palpitations and leg swelling.  Gastrointestinal: Negative for nausea and abdominal pain.  Skin: Negative for rash and wound.  All other systems reviewed and are negative.    Allergies  Levofloxacin  Home Medications   Current Outpatient Rx  Name  Route  Sig  Dispense  Refill  . albuterol (PROVENTIL HFA;VENTOLIN HFA) 108 (90 BASE) MCG/ACT inhaler   Inhalation   Inhale 2 puffs into the lungs every 4 (four) hours as needed.   1 Inhaler   0   . albuterol (PROVENTIL) (2.5 MG/3ML) 0.083% nebulizer solution   Nebulization   Take 2.5 mg by nebulization every 6 (six) hours as  needed.         . calcium-vitamin D (OSCAL WITH D) 250-125 MG-UNIT per tablet   Oral   Take 1 tablet by mouth daily.         . Multiple Vitamin (MULTIVITAMIN) capsule   Oral   Take 1 capsule by mouth daily.         Marland Kitchen tiotropium (SPIRIVA HANDIHALER) 18 MCG inhalation capsule   Inhalation   Place 1 capsule (18 mcg total) into inhaler and inhale daily.   30 capsule   11     BP 118/67  Pulse 124  Temp(Src) 97.5 F (36.4 C) (Oral)  Resp 31  SpO2 87%  Physical Exam  Nursing note and vitals reviewed. Constitutional: She is oriented to person, place, and time. She appears well-developed. No distress.  Frail appearing  HENT:  Head: Normocephalic and atraumatic.  Mouth/Throat: Oropharynx is clear and moist.  Eyes: EOM are normal. Pupils are equal, round, and reactive to light.  Neck: Normal range of motion. Neck supple.  Cardiovascular: Normal rate and regular rhythm.   Pulmonary/Chest: She is in respiratory distress. She has wheezes. She has no rales. She exhibits no tenderness.  Increased WOB with intercostal retractions and tachypnea. Increased exp phase and faint wheezing.   Abdominal: Soft. Bowel sounds are normal.  There is no tenderness. There is no rebound and no guarding.  Musculoskeletal: Normal range of motion. She exhibits no edema and no tenderness.  Neurological: She is alert and oriented to person, place, and time.  Moves all ext, sensation intact  Skin: Skin is warm and dry. No rash noted. No erythema.  Psychiatric: She has a normal mood and affect. Her behavior is normal.    ED Course  Procedures (including critical care time)  Labs Reviewed  COMPREHENSIVE METABOLIC PANEL - Abnormal; Notable for the following:    CO2 34 (*)    Glucose, Bld 155 (*)    GFR calc non Af Amer 84 (*)    All other components within normal limits  CBC WITH DIFFERENTIAL - Abnormal; Notable for the following:    RDW 15.6 (*)    Neutrophils Relative 86 (*)    Lymphocytes  Relative 9 (*)    All other components within normal limits  PRO B NATRIURETIC PEPTIDE - Abnormal; Notable for the following:    Pro B Natriuretic peptide (BNP) 1711.0 (*)    All other components within normal limits  BLOOD GAS, ARTERIAL - Abnormal; Notable for the following:    pH, Arterial 7.334 (*)    pCO2 arterial 62.5 (*)    pO2, Arterial 65.7 (*)    Bicarbonate 32.4 (*)    Acid-Base Excess 4.9 (*)    All other components within normal limits  TROPONIN I   Dg Chest Port 1 View  02/28/2013  *RADIOLOGY REPORT*  Clinical Data: Shortness of breath.  History of asthma.  PORTABLE CHEST - 1 VIEW  Comparison: Chest rate 19,013.  Findings: Lungs appear hyperexpanded with flattening of the hemidiaphragms, increased retrosternal air space and pruning of the pulmonary vasculature in the periphery, suggestive of underlying COPD.  No acute consolidative airspace disease.  No definite pleural effusions (chronic blunting of the costophrenic sulci is most compatible with chronic pleural parenchymal scarring and similar priors).  No evidence of pulmonary edema.  Heart size is normal. The patient is rotated to the left on today's exam, resulting in distortion of the mediastinal contours and reduced diagnostic sensitivity and specificity for mediastinal pathology. Atherosclerosis of the thoracic aorta.  IMPRESSION: 1.  Chronic changes of COPD redemonstrated, without radiographic evidence of acute cardiopulmonary disease. 2.  Atherosclerosis.   Original Report Authenticated By: Trudie Reed, M.D.      1. COPD exacerbation      Date: 02/28/2013  Rate: 123  Rhythm: sinus tachycardia  QRS Axis: normal  Intervals: normal  ST/T Wave abnormalities: normal  Conduction Disutrbances:none  Narrative Interpretation:   Old EKG Reviewed: changes noted PVC, tachycardia since previous.    MDM   Pt initially refused admission. Reconsidered when her husband returned to bedside. Pt is somewhat improved after  treatments though still desat's to 80's on 2 L. [facility] to see in ED and admit       Loren Racer, MD 02/28/13 [phone]

## 2013-03-01 DIAGNOSIS — F17200 Nicotine dependence, unspecified, uncomplicated: Secondary | ICD-10-CM

## 2013-03-01 DIAGNOSIS — J9620 Acute and chronic respiratory failure, unspecified whether with hypoxia or hypercapnia: Principal | ICD-10-CM

## 2013-03-01 DIAGNOSIS — J441 Chronic obstructive pulmonary disease with (acute) exacerbation: Secondary | ICD-10-CM

## 2013-03-01 LAB — CBC
HCT: 36.7 % (ref 36.0–46.0)
Hemoglobin: 12.1 g/dL (ref 12.0–15.0)
MCH: 30.6 pg (ref 26.0–34.0)
MCV: 93.6 fL (ref 78.0–100.0)
RBC: 3.92 MIL/uL (ref 3.87–5.11)
WBC: 4.6 10*3/uL (ref 4.0–10.5)

## 2013-03-01 LAB — BASIC METABOLIC PANEL
CO2: 32 mEq/L (ref 19–32)
Calcium: 8.8 mg/dL (ref 8.4–10.5)
Chloride: 102 mEq/L (ref 96–112)
Glucose, Bld: 127 mg/dL — ABNORMAL HIGH (ref 70–99)
Potassium: 4.1 mEq/L (ref 3.5–5.1)
Sodium: 139 mEq/L (ref 135–145)

## 2013-03-01 MED ORDER — METHYLPREDNISOLONE SODIUM SUCC 125 MG IJ SOLR
60.0000 mg | Freq: Every day | INTRAMUSCULAR | Status: DC
  Administered 2013-03-02: 60 mg via INTRAVENOUS
  Filled 2013-03-01: qty 0.96

## 2013-03-01 MED ORDER — IPRATROPIUM BROMIDE 0.02 % IN SOLN: 0.5000 mg | RESPIRATORY_TRACT | Status: DC | PRN

## 2013-03-01 MED ORDER — ALBUTEROL SULFATE (5 MG/ML) 0.5% IN NEBU
2.5000 mg | INHALATION_SOLUTION | Freq: Four times a day (QID) | RESPIRATORY_TRACT | Status: DC
  Administered 2013-03-01 – 2013-03-02 (×5): 2.5 mg via RESPIRATORY_TRACT
  Filled 2013-03-01 (×4): qty 0.5

## 2013-03-01 MED ORDER — IPRATROPIUM BROMIDE 0.02 % IN SOLN
0.5000 mg | Freq: Four times a day (QID) | RESPIRATORY_TRACT | Status: DC
  Administered 2013-03-01 – 2013-03-02 (×5): 0.5 mg via RESPIRATORY_TRACT
  Filled 2013-03-01 (×5): qty 2.5

## 2013-03-01 NOTE — Progress Notes (Signed)
UR completed 

## 2013-03-01 NOTE — Progress Notes (Signed)
[facility] PROGRESS NOTE  HOLLY IANNACCONE ZOX:[id number] DOB: June 29, 1932 DOA: 02/28/2013 PCP: Carollee Herter, MD  Brief narrative: 79 year old female with past medical history of COPD on home oxygen and active smoking presented to [facility] ED 02/28/2013 with progressively worsening shortness of breath associated with nonproductive cough for 1 week prio to this admission. Patient reported minimal  symptomatic relief with nebulizer use at home. In ED, evaluation included CXR which was consistent with COPD lung changes. Patient has received nebulizer treatments in ED and required temporary BiPAP. Her ABG revealed CO2 level of 63. CBC and BMET were essentially unremarkable.  Assessment/Plan:  Principal Problem: *Acute hypoxic and hypercarbic respiratory failure  Secondary to acute COPD exacerbation  Respiratory status is stable at this time and patient is saturating 93% on 2 L nasal canula  Continue nebulizer treatments (will change the frequency from TID to Q 6 hours scheduled and Q 2 hours PRN, albuterol and Atrovent)  Continue azithromycin IV  Continue solumedrol but decrease dosage from 60 mg Q 6 hours to Q 24 hours IV  Active Problems: Acute COPD exacerbation   CO2 retention due to history of COPD, oxygen dependent  Management as above with nebulizer treatment, steroids and antibiotics  Oxygen support via nasal canula to keep O2 saturation above 90% Tobacco abuse   Smoking cessation counseling provided   Code Status: DNR/DNI  Family Communication: no family at bedside  Disposition Plan: home in next 24 hours    Manson Passey, MD  [facility]  Pager [phone]   Consultants:  None  Procedures:  None  Antibiotics:  Azithromycin 02/28/2013 -->   If 7PM-7AM, please contact night-coverage [email or web address] Password [facility] 03/01/2013, 10:51 AM   LOS: 1 day    HPI/Subjective: No acute overnight events.  Objective: Filed Vitals:   02/28/13 1942 02/28/13 2055 03/01/13 0458  03/01/13 0905  BP:  102/51 94/58   Pulse: 106 103 89   Temp:  98.3 F (36.8 C) 98 F (36.7 C)   TempSrc:  Oral Oral   Resp: 22 20 18    Height:      Weight:      SpO2: 97% 94% 96% 93%    Intake/Output Summary (Last 24 hours) at 03/01/13 1051 Last data filed at 03/01/13 0500  Gross per 24 hour  Intake   1140 ml  Output      0 ml  Net   1140 ml    Exam:   General:  Pt is alert, follows commands appropriately, not in acute distress  Cardiovascular: Regular rate and rhythm, S1/S2, no murmurs, no rubs, no gallops  Respiratory: diminished brewath sounds bilaterally, slight crackles appreciated  Abdomen: Soft, non tender, non distended, bowel sounds present, no guarding  Extremities: No edema, pulses DP and PT palpable bilaterally  Neuro: Grossly nonfocal  Data Reviewed: Basic Metabolic Panel:  Recent Labs Lab 02/28/13 1117 03/01/13 0437  NA 143 139  K 3.7 4.1  CL 99 102  CO2 34* 32  GLUCOSE 155* 127*  BUN 18 25*  CREATININE 0.63 0.73  CALCIUM 9.5 8.8   Liver Function Tests:  Recent Labs Lab 02/28/13 1117  AST 33  ALT 35  ALKPHOS 59  BILITOT 0.4  PROT 7.2  ALBUMIN 3.9   No results found for this basename: LIPASE, AMYLASE,  in the last 168 hours No results found for this basename: AMMONIA,  in the last 168 hours CBC:  Recent Labs Lab 02/28/13 1117 03/01/13 0437  WBC 8.6 4.6  NEUTROABS 7.4  --  HGB 14.9 12.1  HCT 45.6 36.7  MCV 94.2 93.6  PLT 180 164   Cardiac Enzymes:  Recent Labs Lab 02/28/13 1117  TROPONINI <0.30   BNP: No components found with this basename: POCBNP,  CBG: No results found for this basename: GLUCAP,  in the last 168 hours  No results found for this or any previous visit (from the past 240 hour(s)).   Studies: Dg Chest Port 1 View 02/28/2013  *  IMPRESSION: 1.  Chronic changes of COPD redemonstrated, without radiographic evidence of acute cardiopulmonary disease. 2.  Atherosclerosis.   Original Report Authenticated  By: Trudie Reed, M.D.     Scheduled Meds: . albuterol  2.5 mg Nebulization TID  . azithromycin  500 mg Oral Daily  . heparin  5,000 Units Subcutaneous Q8H  . ipratropium  0.5 mg Nebulization TID  . Solumedrool  60 mg Intravenous Daily  . multivitamin with minerals  1 tablet Oral Daily  . sodium chloride  3 mL Intravenous Q12H   Continuous Infusions: . sodium chloride 75 mL/hr at 03/01/13 0147

## 2013-03-02 DIAGNOSIS — J441 Chronic obstructive pulmonary disease with (acute) exacerbation: Secondary | ICD-10-CM | POA: Diagnosis not present

## 2013-03-02 DIAGNOSIS — F17200 Nicotine dependence, unspecified, uncomplicated: Secondary | ICD-10-CM | POA: Diagnosis not present

## 2013-03-02 DIAGNOSIS — J9620 Acute and chronic respiratory failure, unspecified whether with hypoxia or hypercapnia: Secondary | ICD-10-CM | POA: Diagnosis not present

## 2013-03-02 MED ORDER — ALBUTEROL SULFATE HFA 108 (90 BASE) MCG/ACT IN AERS: 2.0000 | INHALATION_SPRAY | RESPIRATORY_TRACT | Status: DC | PRN

## 2013-03-02 MED ORDER — AZITHROMYCIN 500 MG PO TABS: 500.0000 mg | ORAL_TABLET | Freq: Every day | ORAL | Status: DC

## 2013-03-02 MED ORDER — PREDNISONE 10 MG PO TABS: ORAL_TABLET | ORAL | Status: DC

## 2013-03-02 NOTE — Discharge Summary (Signed)
Physician Discharge Summary  [name] ZOX:[id number] DOB: 1932-04-03 DOA: 02/28/2013  PCP: Carollee Herter, MD  Admit date: 02/28/2013 Discharge date: 03/02/2013  Recommendations for Outpatient Follow-up:  1. Patient will continue azithromycin for 3 days on discharge. Patient will also continue prednisone taper from 50 mg a day down to 0 mg and taper by 10 mg a day.  Discharge Diagnoses:  Active Problems:   Acute-on-chronic respiratory failure   COPD exacerbation   Tobacco abuse   Renal insufficiency  Discharge Condition: Medically stable for discharge home today; patient has home oxygen already at home  Diet recommendation: As tolerated  History of present illness:  79 year old female with past medical history of COPD on home oxygen and active smoking presented to [facility] ED 02/28/2013 with progressively worsening shortness of breath associated with nonproductive cough for 1 week prio to this admission. Patient reported minimal symptomatic relief with nebulizer use at home.  In ED, evaluation included CXR which was consistent with COPD lung changes. Patient has received nebulizer treatments in ED and required temporary BiPAP. Her ABG revealed CO2 level of 63. CBC and BMET were essentially unremarkable.   Assessment/Plan:   Principal Problem:  *Acute hypoxic and hypercarbic respiratory failure  Secondary to acute COPD exacerbation  Respiratory status is stable.  Continue azithromycin for 3 more days on discharge. Continue prednisone 50 mg a day and taper down by 10 mg a day to 0 mg. Active Problems:  Acute COPD exacerbation  CO2 retention due to history of COPD, oxygen dependent  Management as above with nebulizer treatment, steroids and antibiotics  Oxygen support via nasal canula to keep O2 saturation above 90% Tobacco abuse  Smoking cessation counseling provided   Code Status: DNR/DNI  Family Communication: no family at bedside  Disposition Plan: home  today  Manson Passey, MD  [facility]  Pager [phone]    Consultants:  None  Procedures:  None  Antibiotics:  Azithromycin 02/28/2013 --> 03/04/2013      Discharge Exam: Filed Vitals:   03/02/13 0535  BP: 105/66  Pulse: 85  Temp: 97.6 F (36.4 C)  Resp: 20   Filed Vitals:   03/01/13 2051 03/01/13 2223 03/02/13 0535 03/02/13 0913  BP:  120/70 105/66   Pulse: 82 83 85   Temp:  98.2 F (36.8 C) 97.6 F (36.4 C)   TempSrc:  Oral Oral   Resp: 20 20 20    Height:      Weight:      SpO2: 98% 100% 99% 97%    General: Pt is alert, follows commands appropriately, not in acute distress Cardiovascular: Regular rate and rhythm, S1/S2 +, no murmurs, no rubs, no gallops Respiratory: Clear to auscultation bilaterally, no wheezing, no crackles, no rhonchi Abdominal: Soft, non tender, non distended, bowel sounds +, no guarding Extremities: no edema, no cyanosis, pulses palpable bilaterally DP and PT Neuro: Grossly nonfocal  Discharge Instructions  Discharge Orders   Future Orders Complete By Expires     Call MD for:  difficulty breathing, headache or visual disturbances  As directed     Call MD for:  persistant dizziness or light-headedness  As directed     Call MD for:  persistant nausea and vomiting  As directed     Call MD for:  severe uncontrolled pain  As directed     Diet - low sodium heart healthy  As directed     Increase activity slowly  As directed         Medication  List    TAKE these medications       albuterol 108 (90 BASE) MCG/ACT inhaler  Commonly known as:  PROVENTIL HFA;VENTOLIN HFA  Inhale 2 puffs into the lungs every 4 (four) hours as needed.     albuterol (2.5 MG/3ML) 0.083% nebulizer solution  Commonly known as:  PROVENTIL  Take 2.5 mg by nebulization every 6 (six) hours as needed.     azithromycin 500 MG tablet  Commonly known as:  ZITHROMAX  Take 1 tablet (500 mg total) by mouth daily.     calcium-vitamin D 250-125 MG-UNIT per tablet  Commonly  known as:  OSCAL WITH D  Take 1 tablet by mouth daily.     multivitamin capsule  Take 1 capsule by mouth daily.     predniSONE 10 MG tablet  Commonly known as:  DELTASONE  Please take 50 mg a day and taper down to 0 mg by  10 mg a day     tiotropium 18 MCG inhalation capsule  Commonly known as:  SPIRIVA HANDIHALER  Place 1 capsule (18 mcg total) into inhaler and inhale daily.           Follow-up Information   Follow up with Carollee Herter, MD In 2 weeks.   Contact information:   [address] [place] [address] [phone]        The results of significant diagnostics from this hospitalization (including imaging, microbiology, ancillary and laboratory) are listed below for reference.    Significant Diagnostic Studies: Dg Chest Port 1 View  02/28/2013  *RADIOLOGY REPORT*  Clinical Data: Shortness of breath.  History of asthma.  PORTABLE CHEST - 1 VIEW  Comparison: Chest rate 19,013.  Findings: Lungs appear hyperexpanded with flattening of the hemidiaphragms, increased retrosternal air space and pruning of the pulmonary vasculature in the periphery, suggestive of underlying COPD.  No acute consolidative airspace disease.  No definite pleural effusions (chronic blunting of the costophrenic sulci is most compatible with chronic pleural parenchymal scarring and similar priors).  No evidence of pulmonary edema.  Heart size is normal. The patient is rotated to the left on today's exam, resulting in distortion of the mediastinal contours and reduced diagnostic sensitivity and specificity for mediastinal pathology. Atherosclerosis of the thoracic aorta.  IMPRESSION: 1.  Chronic changes of COPD redemonstrated, without radiographic evidence of acute cardiopulmonary disease. 2.  Atherosclerosis.   Original Report Authenticated By: Trudie Reed, M.D.     Microbiology: No results found for this or any previous visit (from the past 240 hour(s)).   Labs: Basic Metabolic  Panel:  Recent Labs Lab 02/28/13 1117 03/01/13 0437  NA 143 139  K 3.7 4.1  CL 99 102  CO2 34* 32  GLUCOSE 155* 127*  BUN 18 25*  CREATININE 0.63 0.73  CALCIUM 9.5 8.8   Liver Function Tests:  Recent Labs Lab 02/28/13 1117  AST 33  ALT 35  ALKPHOS 59  BILITOT 0.4  PROT 7.2  ALBUMIN 3.9   No results found for this basename: LIPASE, AMYLASE,  in the last 168 hours No results found for this basename: AMMONIA,  in the last 168 hours CBC:  Recent Labs Lab 02/28/13 1117 03/01/13 0437  WBC 8.6 4.6  NEUTROABS 7.4  --   HGB 14.9 12.1  HCT 45.6 36.7  MCV 94.2 93.6  PLT 180 164   Cardiac Enzymes:  Recent Labs Lab 02/28/13 1117  TROPONINI <0.30   BNP: BNP (last 3 results)  Recent Labs  02/28/13  1117  PROBNP 1711.0*   CBG: No results found for this basename: GLUCAP,  in the last 168 hours  Time coordinating discharge: Over 30 minutes  Signed:  Manson Passey, MD  [facility]  03/02/2013, 10:06 AM  Pager #: [phone]

## 2013-03-02 NOTE — Care Management Note (Addendum)
    Page 1 of 1   03/02/2013     1:15:22 PM   CARE MANAGEMENT NOTE 03/02/2013  Patient:  LYNDI, HOLBEIN   Account Number:  [id number]  Date Initiated:  03/01/2013  Documentation initiated by:  [facility]  Subjective/Objective Assessment:   79 year old female admitted with respiratory failure d/t COPD exacerbation.     Action/Plan:   Lived at home PTA.   Anticipated DC Date:  03/02/2013   Anticipated DC Plan:  HOME/SELF CARE      DC Planning Services  CM consult      Choice offered to / List presented to:             Status of service:  Completed, signed off Medicare Important Message given?  NA - LOS <3 / Initial given by admissions (If response is "NO", the following Medicare IM given date fields will be blank) Date Medicare IM given:   Date Additional Medicare IM given:    Discharge Disposition:  HOME/SELF CARE  Per UR Regulation:  Reviewed for med. necessity/level of care/duration of stay  If discussed at Long Length of Stay Meetings, dates discussed:    Comments:  03/02/13 KATHY MAHABIR RN,BSN NCM [phone] TC [facility] TEL# [phone](LOGISTICS) ABOUT TRAVEL TANK TO BE BROUGHT TO HOSPITAL FOR PATIENT.PATIENT STATES 02 TANKS @ HOME ARE EMPTY. [facility] PROVIDED W/RM#,& LOCATION,WILL DELIVER TODAY,WITHIN A FEW HOURS.D/C HOME NO ORDERS OR FURTHER NEEDS.

## 2013-03-04 ENCOUNTER — Telehealth: Payer: Self-pay | Admitting: Medical

## 2013-03-04 NOTE — Telephone Encounter (Signed)
pls set up

## 2013-03-04 NOTE — Telephone Encounter (Signed)
[name]

## 2013-03-04 NOTE — Telephone Encounter (Signed)
Please call  Patient was discharged from hospital on Sunday for acute COPD. Family would like her to be set up with [facility] for their respiratory program, She used this several years ago, Dr. Susann Givens ordered this for her 3-4 years ago And it was helpful

## 2013-03-04 NOTE — Telephone Encounter (Signed)
pls set up this request

## 2013-03-06 ENCOUNTER — Telehealth: Payer: Self-pay | Admitting: Medical

## 2013-03-06 NOTE — Telephone Encounter (Signed)
S/W [name]. SHE HAS BEEN WAITING ON REP [facility] TO CONTACT HER SO THAT SHE CAN FIND OUT WHAT RESPIRATORY SUPPLIES PT RCVD FRM THEM PREVIOUSLY TO GET SAME THING SET UP THIS TIME. REP HAS NOT CONTACTED [place] BACK. CHERI FOUND OLD INFO IN PT'S PAPER CHART& SENT THAT OVER TO [facility]. [facility] SENT FAX ASKING FOR FACE TO FACE APPT. CALLED PT'S DAUGHTER TO ADVISE OF THIS. PT HAS APPT ON 3/19 AND THEY WANT TO GET FACE TO FACE FORM COMPLETED AT THAT TIME

## 2013-03-09 NOTE — Telephone Encounter (Signed)
I called the patient and she states that she has to come in for a office visit first before we can schedule anything through [facility]. CLS

## 2013-03-11 ENCOUNTER — Encounter: Payer: Self-pay | Admitting: Medical

## 2013-03-11 ENCOUNTER — Ambulatory Visit (INDEPENDENT_AMBULATORY_CARE_PROVIDER_SITE_OTHER): Payer: Medicare Other | Admitting: Medical

## 2013-03-11 VITALS — BP 110/78 | HR 92 | Temp 98.1°F | Resp 16 | Wt 97.0 lb

## 2013-03-11 DIAGNOSIS — Z9981 Dependence on supplemental oxygen: Secondary | ICD-10-CM | POA: Diagnosis not present

## 2013-03-11 DIAGNOSIS — J441 Chronic obstructive pulmonary disease with (acute) exacerbation: Secondary | ICD-10-CM | POA: Diagnosis not present

## 2013-03-11 DIAGNOSIS — F17200 Nicotine dependence, unspecified, uncomplicated: Secondary | ICD-10-CM

## 2013-03-11 DIAGNOSIS — R0902 Hypoxemia: Secondary | ICD-10-CM

## 2013-03-11 NOTE — Patient Instructions (Signed)
Your baseline weight is 97 lb.  Check your weight daily. If you notice a loss or gain of 4+ lbs on any given day, then call us or Dr. Sherene Sires.    Check your oxygen level daily.  The goal is to keep it above 90% on room air.   You are currently using 2 liter/min of oxygen at bedtime only.  If you notice your oxygen going below 90% on room air, then use oxygen during the day time also.    Your baseline nebulizer use is twice daily albuterol.   If you breathing is worse than usual, then increase the albuterol nebulizer to 3-4 times daily.    If you notice colored sputum, begin Mucinex plain OTC twice daily.  If you have any of these symptoms for 2-3 days at a time (oxygen less than 90%, fever >100, colored sputum, worse breathing), then call [facility], Dr. Thurston Hole office, or come in to be seen.    Chronic Obstructive Pulmonary Disease Chronic obstructive pulmonary disease (COPD) is a condition in which airflow from the lungs is restricted. The lungs can never return to normal, but there are measures you can take which will improve them and make you feel better. CAUSES   Smoking.  Exposure to secondhand smoke.  Breathing in irritants (pollution, cigarette smoke, strong smells, aerosol sprays, paint fumes).  History of lung infections. TREATMENT  Treatment focuses on making you comfortable (supportive care). Your caregiver may prescribe medications (inhaled or pills) to help improve your breathing. HOME CARE INSTRUCTIONS   If you smoke, stop smoking.  Avoid exposure to smoke, chemicals, and fumes that aggravate your breathing.  Take antibiotic medicines as directed by your caregiver.  Avoid medicines that dry up your system and slow down the elimination of secretions (antihistamines and cough syrups). This decreases respiratory capacity and may lead to infections.  Drink enough water and fluids to keep your urine clear or pale yellow. This loosens secretions.  Use  humidifiers at home and at your bedside if they do not make breathing difficult.  Receive all protective vaccines your caregiver suggests, especially pneumococcal and influenza.  Use home oxygen as suggested.  Stay active. Exercise and physical activity will help maintain your ability to do things you want to do.  Eat a healthy diet. SEEK MEDICAL CARE IF:   You develop pus-like mucus (sputum).  Breathing is more labored or exercise becomes difficult to do.  You are running out of the medicine you take for your breathing. SEEK IMMEDIATE MEDICAL CARE IF:   You have a rapid heart rate.  You have agitation, confusion, tremors, or are in a stupor (family members may need to observe this).  It becomes difficult to breathe.  You develop chest pain.  You have a fever. MAKE SURE YOU:   Understand these instructions.  Will watch your condition.  Will get help right away if you are not doing well or get worse. Document Released: 09/19/2005 Document Revised: 03/03/2012 Document Reviewed: 02/09/2011 [facility] Patient Information 2013 [place], [place].

## 2013-03-12 ENCOUNTER — Encounter: Payer: Self-pay | Admitting: Medical

## 2013-03-12 DIAGNOSIS — I2789 Other specified pulmonary heart diseases: Secondary | ICD-10-CM | POA: Diagnosis not present

## 2013-03-12 DIAGNOSIS — F17200 Nicotine dependence, unspecified, uncomplicated: Secondary | ICD-10-CM | POA: Diagnosis not present

## 2013-03-12 DIAGNOSIS — M810 Age-related osteoporosis without current pathological fracture: Secondary | ICD-10-CM | POA: Diagnosis not present

## 2013-03-12 DIAGNOSIS — N189 Chronic kidney disease, unspecified: Secondary | ICD-10-CM | POA: Diagnosis not present

## 2013-03-12 DIAGNOSIS — J45901 Unspecified asthma with (acute) exacerbation: Secondary | ICD-10-CM | POA: Diagnosis not present

## 2013-03-12 DIAGNOSIS — J438 Other emphysema: Secondary | ICD-10-CM | POA: Diagnosis not present

## 2013-03-12 NOTE — Progress Notes (Signed)
Subjective: Here for hospital f/u.  Was hospitalized 02/28/13- 03/02/13 for COPD exacerbation, acute on chronic respiratory failure, is DNR status per hospital note.   She has finished the discharge medications of Zpak and prednisone.   She is feeling much better.  She requests referral for [facility] respiratory care program which she has used in the past.  She is worried because she didn't recognize this time that she had gotten so bad until she all the sudden was having much difficulty breathing. Her baseline regimen is ipratropium daily, albuterol nebs BID, 2L/min O2 QHS, but since the hospitalization, she is using some daytime oxygen as well.  She continues to smoke.   No other new c/o.    Past Medical History  Diagnosis Date  . Osteoporosis   . Tobacco use disorder   . COPD (chronic obstructive pulmonary disease)   . Cor pulmonale   . Asthma    ROS as in subjective  Objective: Filed Vitals:   03/11/13 1333  BP: 110/78  Pulse: 92  Temp: 98.1 F (36.7 C)  Resp: 16    General appearance: alert, no distress, cachetic white female HEENT: normocephalic, sclerae anicteric, TMs pearly, nares patent, no discharge or erythema, pharynx normal Oral cavity: MMM, no lesions Neck: supple, no lymphadenopathy, no thyromegaly, no masses Heart: RRR, normal S1, S2, no murmurs Lungs: decreased in general, no wheezes, rhonchi, or rales Abdomen: +bs, soft, non tender, non distended, no masses, no hepatomegaly, no splenomegaly Pulses: 2+ symmetric Ext: no edema  Assessment: Encounter Diagnoses  Name Primary?  Marland Kitchen COPD exacerbation Yes  . Hypoxemia   . On home oxygen therapy   . Tobacco use disorder     Plan: Pulse ox 83-88% room air currently.  She doesn't appear to be labored in breathing.  Slowly improving.  For the next 5 days I advised she use daytime and QHS oxygen at 2L/min, albuterol nebs 3-4 times daily, c/t ipratropium as usual, and activity as tolerated.  We will set up home  [facility] for nurse eval, teaching, monitoring, medication review, symptom and self awareness review of symptoms/home monitoring, PT.   I advised that once things get back to baseline, she needs to use pulse ox, daily weights, cough/DOE symptoms, sputum production, fever as guidance to worsening symptoms.  We discussed this in detail including steps to take leading up to calling/coming in for visit with [place] or pulmonology.  I asked her to call Monday to update me on her pulse ox, symptoms.  Unfortunately she is not planning to stop smoking.

## 2013-03-13 ENCOUNTER — Telehealth: Payer: Self-pay | Admitting: Medical

## 2013-03-13 NOTE — Telephone Encounter (Signed)
Patient is aware. CLS 

## 2013-03-13 NOTE — Telephone Encounter (Signed)
pls call back. I'm glad she is doing better today, call in Monday with pulse ox reading, weight, and symptoms.   We have initiated [facility] referral at her request.

## 2013-03-16 DIAGNOSIS — J438 Other emphysema: Secondary | ICD-10-CM | POA: Diagnosis not present

## 2013-03-16 DIAGNOSIS — J441 Chronic obstructive pulmonary disease with (acute) exacerbation: Secondary | ICD-10-CM | POA: Diagnosis not present

## 2013-03-16 DIAGNOSIS — N189 Chronic kidney disease, unspecified: Secondary | ICD-10-CM | POA: Diagnosis not present

## 2013-03-16 DIAGNOSIS — F17200 Nicotine dependence, unspecified, uncomplicated: Secondary | ICD-10-CM | POA: Diagnosis not present

## 2013-03-16 DIAGNOSIS — M810 Age-related osteoporosis without current pathological fracture: Secondary | ICD-10-CM | POA: Diagnosis not present

## 2013-03-16 DIAGNOSIS — I2789 Other specified pulmonary heart diseases: Secondary | ICD-10-CM | POA: Diagnosis not present

## 2013-03-17 ENCOUNTER — Telehealth: Payer: Self-pay | Admitting: Medical

## 2013-03-19 DIAGNOSIS — N189 Chronic kidney disease, unspecified: Secondary | ICD-10-CM | POA: Diagnosis not present

## 2013-03-19 DIAGNOSIS — F17200 Nicotine dependence, unspecified, uncomplicated: Secondary | ICD-10-CM | POA: Diagnosis not present

## 2013-03-19 DIAGNOSIS — I2789 Other specified pulmonary heart diseases: Secondary | ICD-10-CM | POA: Diagnosis not present

## 2013-03-19 DIAGNOSIS — J438 Other emphysema: Secondary | ICD-10-CM | POA: Diagnosis not present

## 2013-03-19 DIAGNOSIS — M810 Age-related osteoporosis without current pathological fracture: Secondary | ICD-10-CM | POA: Diagnosis not present

## 2013-03-19 DIAGNOSIS — J441 Chronic obstructive pulmonary disease with (acute) exacerbation: Secondary | ICD-10-CM | POA: Diagnosis not present

## 2013-03-20 NOTE — Telephone Encounter (Signed)
Has home health already been out?  What did they do the first visit?    What is pulse ox and weight today?

## 2013-03-23 DIAGNOSIS — N189 Chronic kidney disease, unspecified: Secondary | ICD-10-CM | POA: Diagnosis not present

## 2013-03-23 DIAGNOSIS — I2789 Other specified pulmonary heart diseases: Secondary | ICD-10-CM | POA: Diagnosis not present

## 2013-03-23 DIAGNOSIS — J438 Other emphysema: Secondary | ICD-10-CM | POA: Diagnosis not present

## 2013-03-23 DIAGNOSIS — J441 Chronic obstructive pulmonary disease with (acute) exacerbation: Secondary | ICD-10-CM | POA: Diagnosis not present

## 2013-03-23 DIAGNOSIS — F17200 Nicotine dependence, unspecified, uncomplicated: Secondary | ICD-10-CM | POA: Diagnosis not present

## 2013-03-23 DIAGNOSIS — M810 Age-related osteoporosis without current pathological fracture: Secondary | ICD-10-CM | POA: Diagnosis not present

## 2013-03-23 NOTE — Telephone Encounter (Signed)
Patient states that her weight today is 100. They check her pulse ox and it was 89% and her temp was 96. She said that there first visit was on 03/11/13. She said that she has stop smoking. CLS

## 2013-03-24 NOTE — Telephone Encounter (Signed)
pls pull paper chart

## 2013-03-24 NOTE — Telephone Encounter (Signed)
Chart review: Medications: spiriva, albuterol, Ca+D, MV Medication problems: osteoporosis, tobacco use, COPD, per Dr.Wert Cor Pulmonale, AK of face, impaired fasting glucose. hospitlaization 02/2013 with respiratory distresss, COPD exacerbation, had to have BiPap, respirtory distress, elevated BNP.   Points to review: *hx/o cor pulmonale, cardiology referral? Echo? *impaired glucose - fasting glucose and HgbA1C *tobaccco use - she has been reluctant to completely stop tobacco *hypoxia on oxygen *COPD/frequent exacerbations - f/u with Dr. Sherene Sires *osteoporosis? On Ca+D OTC   Prognosis, DNR status, next steps?

## 2013-03-25 ENCOUNTER — Encounter: Payer: Self-pay | Admitting: Medical

## 2013-03-25 NOTE — Telephone Encounter (Signed)
Chart pulled °

## 2013-03-31 NOTE — Telephone Encounter (Signed)
Have her come in for recheck, .   i want to review health history, medications, recommendations.

## 2013-04-01 NOTE — Telephone Encounter (Signed)
Patient is aware and her appointment is 04/07/13 @ 130 pm. CLS

## 2013-04-07 ENCOUNTER — Telehealth: Payer: Self-pay | Admitting: Family Medicine

## 2013-04-07 ENCOUNTER — Encounter: Payer: Self-pay | Admitting: Medical

## 2013-04-07 ENCOUNTER — Ambulatory Visit (INDEPENDENT_AMBULATORY_CARE_PROVIDER_SITE_OTHER): Payer: Medicare Other | Admitting: Medical

## 2013-04-07 VITALS — BP 120/70 | HR 60 | Temp 98.3°F | Resp 16 | Ht 66.0 in | Wt 106.0 lb

## 2013-04-07 DIAGNOSIS — R799 Abnormal finding of blood chemistry, unspecified: Secondary | ICD-10-CM

## 2013-04-07 DIAGNOSIS — R636 Underweight: Secondary | ICD-10-CM | POA: Diagnosis not present

## 2013-04-07 DIAGNOSIS — R0902 Hypoxemia: Secondary | ICD-10-CM | POA: Diagnosis not present

## 2013-04-07 DIAGNOSIS — R7989 Other specified abnormal findings of blood chemistry: Secondary | ICD-10-CM

## 2013-04-07 DIAGNOSIS — Z23 Encounter for immunization: Secondary | ICD-10-CM

## 2013-04-07 DIAGNOSIS — R5383 Other fatigue: Secondary | ICD-10-CM

## 2013-04-07 DIAGNOSIS — R5381 Other malaise: Secondary | ICD-10-CM

## 2013-04-07 DIAGNOSIS — R7301 Impaired fasting glucose: Secondary | ICD-10-CM

## 2013-04-07 DIAGNOSIS — J449 Chronic obstructive pulmonary disease, unspecified: Secondary | ICD-10-CM

## 2013-04-07 DIAGNOSIS — M810 Age-related osteoporosis without current pathological fracture: Secondary | ICD-10-CM | POA: Diagnosis not present

## 2013-04-07 LAB — HEMOGLOBIN A1C: Hgb A1c MFr Bld: 5.7 % — ABNORMAL HIGH (ref <5.7)

## 2013-04-07 NOTE — Progress Notes (Signed)
Subjective: Here for f/u at my request for routine f/u.       Hx/o COPD with hospitalization back in early March 2014.  At last visit she requested home health support which we set up.   She has had 3 visits from home health nurse.  They checked vitals, dicussed her medications a little, but overall, the home health nurse "didn't go over much."  Breathing has been good of late.  Has felt good in the past week or more.  Using the Spiriva once day.   Not having to use the albuterol inhaler at all in the last week.  Oxygen current at night, but some during the day.   Using 2L/min oxygen.   Hasn't smoked since March 2014.  The last few days home pulse oxygen runs 87-92%.  She reports no prior cardiology evaluation.    Osteoporosis - takes multivitamins, not sure how much Vit D she is getting.  Was on fosamax in at least 2008, but none recently.     She thinks she has had pneumococcal vaccine in the past but likely more than 5 years ago.  Past Medical History  Diagnosis Date  . Osteoporosis   . Cor pulmonale   . Asthma   . H/O bone density study 2008    osteoporosis, improved density on Fosamax at that time  . COPD (chronic obstructive pulmonary disease) 01/2010    Dr. Sherene Sires;  . Hypoxia 01/2013    hospitalization, Bipap therapy, COPD exacerbation  . Former smoker     quit 02/2013   ROS as in subjective  Objective: Gen: looks better today in general, has gained a little weight, color looks better Heent: negative, normal Heart: RRR, S1, S2 normal, no murmurs Lungs: decreased breath sounds in general, no obvious wheezes and no accessory muscle use today Ext: no edema, no cyanosis Pulse normal  Assessment: Encounter Diagnoses  Name Primary?  Marland Kitchen COPD (chronic obstructive pulmonary disease) Yes  . Elevated brain natriuretic peptide (BNP) level   . Hypoxemia   . Osteoporosis, unspecified   . Need for prophylactic vaccination against Streptococcus pneumoniae (pneumococcus)   . Need for Tdap  vaccination   . Underweight   . Impaired fasting blood sugar   . Other malaise and fatigue     COPD, hypoxemia - managed by Dr. Ocie Doyne, hospitalization 02/2013, at current at baseline.   Baseline pulse ox around 89-90%.  C/t Spiriva, albuterol prn, oxygen at bedtime, during the day only prn.    Elevated BNP- will refer for baseline cardiology eval.  She has never seen cardiology, but in light of the worsening of her COPD and Dr. Thurston Hole notes regarding cor pulmonale, will get baseline eval.  Osteoporosis - consider Fosamax, make sure taking adequate Ca+D as discussed.  Pneumococcal vaccine, VIS and counseling today  Underweight - improved.  Discussed getting good nutrition, keeping weight stable  Impaired fasting glucose - HgbA1C today  Thyroid labs today. Reviewed other recent labs.   Of note, she was not too thrilled about additional eval today - labs, cardio referral, but I tried to reitearte that this is more for completeness, helpign to get a baseline on cardiac function given the increased frequency of COPD flares this past year, overall age and status.

## 2013-04-07 NOTE — Patient Instructions (Signed)
Glad to see you are doing ok today.  Recommendations:  I want to refer you for baseline cardiac screening with Dr. Donnie Aho, cardiology  Make sure you are taking Calcium 1500mg  daily and Vitamin D 1500-2000 units OTC daily  If you are agreeable to restarting Fosamax, I think this would be helpful to help prevent bone loss. Let me know.    Eat a healthy variety of foods, 3-5 meals daily  We will call with the lab results  We updated your pneumonia vaccine today.

## 2013-04-07 NOTE — Telephone Encounter (Signed)
Patient is aware of her  appointment with Dr. Donnie Aho on 04/13/13 @ 145 pm . CLS [phone]

## 2013-04-08 LAB — TSH: TSH: 2.454 u[IU]/mL (ref 0.350–4.500)

## 2013-04-13 ENCOUNTER — Encounter: Payer: Self-pay | Admitting: Cardiology

## 2013-04-13 DIAGNOSIS — J441 Chronic obstructive pulmonary disease with (acute) exacerbation: Secondary | ICD-10-CM | POA: Diagnosis not present

## 2013-04-13 DIAGNOSIS — R0989 Other specified symptoms and signs involving the circulatory and respiratory systems: Secondary | ICD-10-CM | POA: Diagnosis not present

## 2013-04-13 DIAGNOSIS — R0609 Other forms of dyspnea: Secondary | ICD-10-CM | POA: Diagnosis not present

## 2013-04-13 NOTE — Progress Notes (Signed)
Patient ID: [name], female   DOB: 1932/07/21, 79 y.o.   MRN: [id number]  Debra, Booker  Date of visit:  04/13/2013 DOB:  1932-01-17    Age:  79 yrs. Medical record number:  [id number]     Account number:  [id number] Primary Care Provider: Ernst Breach ____________________________ CURRENT DIAGNOSES  1. Dyspnea  2. COPD  3. Atherosclerosis Vascular Disease ____________________________ ALLERGIES  Levoflox ____________________________ MEDICATIONS  1. Spiriva with HandiHaler 18 mcg capsule, w/inhalation device, qd  2. Proventil HFA 90 mcg/actuation HFA aerosol inhaler, PRN  3. Alphagan P 0.1 % drops, 1 gtt od bid  4. aspirin 81 mg tablet,chewable, 1 p.o. daily ____________________________ CHIEF COMPLAINTS  Told to have heart checked out due to having copd ____________________________ HISTORY OF PRESENT ILLNESS  This very nice 79 year old female is seen for cardiac consultation. The patient has no prior history of coronary disease or heart disease. She has had long-term cigarette abuse and was hospitalized with exacerbation of COPD in March. During that hospitalization she was found to have an elevated BNP at 1700. A previous BNP of 2013 was completely normal. Her dyspnea improved with inhalers and she never had any anginal type chest pain. She normally has no PND or orthopnea but may have had some episodic edema. I was asked to see her for cardiac consultation. She has been able to discontinue smoking since then. She was noted to have some atherosclerotic disease on a plain chest x-ray in her aorta. She has no claudication or TIA symptoms. She otherwise feels relatively good except that she is underweight. She does have significant dyspnea with exertion. ____________________________ PAST HISTORY  Past Medical Illnesses:  COPD;  Cardiovascular Illnesses:  no previous history of cardiac disease.;  Surgical Procedures:  cataract extraction OU, cholecystectomy, hemmoroidectomy;   Cardiology Procedures-Invasive:  no history of prior cardiac procedures;  Cardiology Procedures-Noninvasive:  no previous non-invasive procedures;  LVEF not documented,   ____________________________ CARDIO-PULMONARY TEST DATES EKG Date:  04/13/2013;   ____________________________ FAMILY HISTORY Father - age 66, died of COPD and CAD; Mother - age 48,  diabetes-unknown type and died of colon cancer; Sister 1 - age 43,  alive and well; Sister 2 - age 78,  alive and well and  history of CABG; Sister 20 - age 92,  alive and well;  ____________________________ SOCIAL HISTORY Alcohol Use:  no alcohol use;  Smoking:  used to smoke but quit, 2014;  Diet:  regular diet;  Lifestyle:  married;  Exercise:  no regular exercise;  Occupation:  retired and Teacher, music;  Residence:  lives with husband;   ____________________________ REVIEW OF SYSTEMS General:  denies recent weight change, fatique or change in exercise tolerance.  Integumentary:no rashes or new skin lesions. Eyes: cataract extraction with lens implants Ears, Nose, Throat, Mouth:  partial hearing loss, hearing aides Respiratory: dyspnea with exertion Cardiovascular:  please review HPI Abdominal: denies dyspepsia, GI bleeding, constipation, or diarrheaGenitourinary-Female: no dysuria, urgency, frequency, UTIs, or stress incontinence Musculoskeletal:  denies arthritis, venous insufficiency, or muscle weakness. Neurological:  denies headaches, stroke, or TIA Psychiatric:  denies depession or anxiety. Hematological/Immunologic:  denies any food allergies, bleeding disorders. ____________________________ PHYSICAL EXAMINATION VITAL SIGNS  Blood Pressure:  126/60 Sitting, Left arm, regular cuff  , 124/66 Standing, Left arm and regular cuff   Pulse:  88/min. Weight:  108.00 lbs. Height:  66"BMI: 17  Constitutional:  thin, pleasant white female, in no acute distress Skin:  warm and dry to touch, no apparent skin  lesions, or masses noted. Head:   normocephalic, normal hair pattern, no masses or tenderness Eyes:  EOMS Intact, PERRLA, C and S clear, Funduscopic exam not done. ENT:  ears, nose and throat reveal no gross abnormalities.  Dentition good. Neck:  supple, without massess. No JVD, thyromegaly or carotid bruits. Carotid upstroke normal. Chest:  normal symmetry, clear to auscultation and percussion. Cardiac:  regular rhythm, normal S1 and S2, No S3 or S4, no murmurs, gallops or rubs detected. Abdomen:  abdomen soft,non-tender, no masses, no hepatospenomegaly, or aneurysm noted Peripheral Pulses:  the femoral,dorsalis pedis, and posterior tibial pulses are full and equal bilaterally with no bruits auscultated. Extremities & Back:  no deformities, clubbing, cyanosis, erythema or edema observed. Normal muscle strength and tone. Neurological:  no gross motor or sensory deficits noted, affect appropriate, oriented x3. ____________________________ IMPRESSIONS/PLAN  1. Elevation of BNP without evidence of clinical congestive heart failure previously 2. Severe COPD 3. Evidence of atherosclerotic vascular disease as manifested on chest x-ray by calcifications  Recommendations:  She is really not had any cardiovascular symptoms but had an isolated elevation of her BNP that was new. This could be due to cor pulmonale or could represent diastolic dysfunction. I've recommended that she have an echocardiogram to assess her right and left heart function. Her EKG was normal today.  In terms of her vascular disease she might benefit from taking a low-dose aspirin 81 mg daily. I did not see where she has had lipid testing and would recommend that she have a fasting lipid panel return. Thank you for asking me to see her with you. ____________________________ TODAYS ORDERS  1. 2D, color flow, doppler: First Available  2. Lipid Panel: at pt. conv  3. Return Visit: 3 weeks  4. 12 Lead EKG: Today                        ____________________________ Cardiology Physician:  Darden Palmer MD [facility]

## 2013-04-22 DIAGNOSIS — R0609 Other forms of dyspnea: Secondary | ICD-10-CM | POA: Diagnosis not present

## 2013-04-22 DIAGNOSIS — R0989 Other specified symptoms and signs involving the circulatory and respiratory systems: Secondary | ICD-10-CM | POA: Diagnosis not present

## 2013-04-28 ENCOUNTER — Encounter: Payer: Self-pay | Admitting: Medical

## 2013-04-28 ENCOUNTER — Ambulatory Visit (INDEPENDENT_AMBULATORY_CARE_PROVIDER_SITE_OTHER): Payer: Medicare Other | Admitting: Medical

## 2013-04-28 VITALS — BP 118/70 | HR 93 | Temp 98.1°F | Resp 20 | Wt 106.5 lb

## 2013-04-28 DIAGNOSIS — J441 Chronic obstructive pulmonary disease with (acute) exacerbation: Secondary | ICD-10-CM | POA: Diagnosis not present

## 2013-04-28 MED ORDER — METHYLPREDNISOLONE SODIUM SUCC 125 MG IJ SOLR
125.0000 mg | Freq: Once | INTRAMUSCULAR | Status: AC
  Administered 2013-04-28: 125 mg via INTRAVENOUS

## 2013-04-28 MED ORDER — PREDNISONE 10 MG PO TABS: ORAL_TABLET | ORAL | Status: DC

## 2013-04-28 NOTE — Progress Notes (Signed)
Subjective:  [name] is a 79 y.o. female who presents for dyspnea. Was hospitalized not too long ago for same.  She notes in the last 5 days worse breathing, having to use oxygen 2L/min round the clock instead of QHS, using nebulizer q4 hours, spiriva daily as usual.  No recent fever, productive sputum, no runny nose, sneezing, or other URI symptoms other than cough.  No chest pain, swelling.  She just had echocardiogram of her heart recently and said that Dr. Donnie Aho noted things look ok.  No sick contacts.  No other aggravating or relieving factors.  No other c/o.  Baseline O2 sat has been running high 80s, low 90s.  Sees Dr. Sherene Sires.   The following portions of the patient's history were reviewed and updated as appropriate: allergies, current medications, past family history, past medical history, past social history, past surgical history and problem list.   Objective: Vital signs reviewed  General appearance: Alert, WD/WN, dyspneic on presentation                             Skin: warm, no rash, no diaphoresis                           Head: no sinus tenderness                            Eyes: conjunctiva normal, corneas clear, PERRLA                            Ears: pearly TMs, external ear canals normal                          Nose: septum midline, turbinates swollen, with erythema and clear discharge             Mouth/throat: MMM, tongue normal, mild pharyngeal erythema                           Neck: supple, no adenopathy, no thyromegaly, nontender                          Heart: RRR, normal S1, S2, no murmurs                         Lungs:decreased breath sounds and wheezes upon presentation, no rales                Extremities: no edema, nontender     Assessment and Plan:  Encounter Diagnosis  Name Primary?  Marland Kitchen COPD exacerbation Yes   On presentation after vitals reviewed and exam, gave 2L/min oxygen by nasal canula, 1 round of albuterol and she improved rapidly.   After  about 15 minutes of observation on oxygen, took her back off oxygen.   She remained 95% pulse ox and was breathing normally until discharge.  Gave 125mg  Solumedrol.   Recent trigger likely pollen.   Sent home on Prednisone taper as well.  Discussed her medication regimen for the next few days, and if worse or not improving in the next 1-2 days, come back or go to the ED.  Otherwise if improving, f/u with Dr. Sherene Sires Friday.  Patient Instructions  For the next 3-5 days  use oxygen most of the day, 2L/min.  continue nebulizer every 4 hours the next 3-5 days  We gave you a shot of Solumedrol steroid in the office  Begin the oral prednisone as well, start this tomorrow.  Continue Spiriva  Stay out of the pollen or really hot temperatures  Consider recheck with Dr. Sherene Sires Friday or Monday  Call, return or go to the emergency department if worse

## 2013-04-28 NOTE — Patient Instructions (Signed)
For the next 3-5 days  use oxygen most of the day, 2L/min.  continue nebulizer every 4 hours the next 3-5 days  We gave you a shot of Solumedrol steroid in the office  Begin the oral prednisone as well, start this tomorrow.  Continue Spiriva  Stay out of the pollen or really hot temperatures  Consider recheck with Dr. Sherene Sires Friday or Monday  Call, return or go to the emergency department if worse

## 2013-05-01 ENCOUNTER — Ambulatory Visit (INDEPENDENT_AMBULATORY_CARE_PROVIDER_SITE_OTHER): Payer: Medicare Other | Admitting: Internal Medicine

## 2013-05-01 ENCOUNTER — Encounter: Payer: Self-pay | Admitting: Internal Medicine

## 2013-05-01 VITALS — BP 138/72 | HR 70 | Temp 98.1°F | Ht 67.0 in | Wt 105.8 lb

## 2013-05-01 DIAGNOSIS — J449 Chronic obstructive pulmonary disease, unspecified: Secondary | ICD-10-CM | POA: Diagnosis not present

## 2013-05-01 DIAGNOSIS — J9610 Chronic respiratory failure, unspecified whether with hypoxia or hypercapnia: Secondary | ICD-10-CM | POA: Diagnosis not present

## 2013-05-01 MED ORDER — BUDESONIDE-FORMOTEROL FUMARATE 160-4.5 MCG/ACT IN AERO: INHALATION_SPRAY | RESPIRATORY_TRACT | Status: DC

## 2013-05-01 NOTE — Progress Notes (Signed)
Subjective:    Patient ID: [name]    DOB: [birth year]   MRN: [id number]  HPI  53 yowf active smoker referred 02/08/2012 by [name] for intermittent sob was on 02 but stopped in early 2013    02/08/2012 1st pulmonary ov/ cc "intermittently" bad breathing x 2-3 y  but on  best day can do  [employer] full aisles  but not mall with episodes much worse last starting 3 weeks prior to OV  rx already by abx and prednisone and some better.  Assoc with congested cough slt discolored better p abx but really has year round am cough x years, worse in winter rec Moderate copd/ emphysema (not severe) I reviewed the Flethcher curve    Work on inhaler technique:  Late add:  Needs cxr on return  03/24/2012 f/u ov/[name] cc doe not changed only  using albuterol but 4 x weekly, minimal am cough, mucoid. No active sinus or hb symptoms rec pt wants alternative to spiriva...feet and ankles swelling since beginning treatment 03/24/12 Pt stopped using 05/06/12   05/09/2012 f/u ov/[name] cc leg swelling p first box of spiriva but did feel it helped breathing, swelling is worse on L sltly, better since held the spiriva. 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 No cough, overt sinus or reflux symptoms. rec Try tudorza one twice daily - if you don't see the green turn red it didn't work  Please see patient coordinator before you leave today  to schedule overnight oximetry on room air Please schedule a follow up office visit in 4 weeks, sooner if needed   06/12/2012 f/u ov/[name] still smoking cc sleeping ok on 02 no am wakes up feeling rested no ha.  [place] x > [employer], usually does not use HC parking. No variability or overt sinus or hb symptoms or need for daytime saba.  Not interested in daytime 02 or committed to stop smoking at this point. Likes spiriva > tudorza because not able to trigger the tudorza consistently. rec The key is to stop smoking completely before smoking completely stops you - no  change in meds    08/11/2012 f/u ov/[name] cc missed appt  09/24/2012 f/u ov/[name] still smoking cc doe x ok at [employer] pushing cart rec Work on inhaler technique/ f/u prn   01/05/2013 f/u ov/[name] cc no change in breathing although had aecopd and was provided with nebulizer but no clearly what she's using in it >  back to baseline doe ([employer] shopping)and using hfa twice daily and neb once daily. Has congested cough, still smoking rec Change 02 regimen:  2lpm baseline and walking outside of home  Plan A  Continue Spriva one capsule daily each am and stop smoking Only use your albuterol (plan B proaire, plan C is the nebulizer)  as a rescue medication to be used if you can't catch your breath by resting or doing a relaxed purse lip breathing pattern. The less you use it, the better it will work when you need it. Ok to use up to every 4 hours. For cough/ congestion >  mucinex 600 mg 1-2 every 12 hours as needed Call back with name of neb   05/01/2013 f/u ov/[name] re copd/ most 2lpm 20 h/d  always wears sleeping no longer smoking 60 days Chief Complaint  Patient presents with  . Follow-up    Breathing worse x 1 wk. Out of breath walking from parking lot to our building this morning. Sats 79%  on ra- she has her portable o2 but not sure how to use it. Sats increased to 92%2lpm..   started prednisone 3 days prior to OV  Per S Tysinger, not really better yet, not using spiriva correctly, confused with action plan and still doesn't know names of meds/ different types of albuterol. No change in chronic cough with this flare and really not doing much better since quit smoking   No obvious daytime variabilty or assoc  cp or chest tightness, subjective wheeze overt sinus or hb symptoms. No unusual exp hx .   Sleeping ok on 2lpm without nocturnal  or early am exacerbation  of respiratory  c/o's or need for noct saba. Also denies any obvious fluctuation of symptoms with weather or environmental changes or  other aggravating or alleviating factors except as outlined above.    Current Medications, Allergies, Past Medical History, Past Surgical History, Family History, and Social History were reviewed in [employer] record.  ROS  The following are not active complaints unless bolded sore throat, dysphagia, dental problems, itching, sneezing,  nasal congestion or excess/ purulent secretions, ear ache,   fever, chills, sweats, unintended wt loss, pleuritic or exertional cp, hemoptysis,  orthopnea pnd or leg swelling, presyncope, palpitations, heartburn, abdominal pain, anorexia, nausea, vomiting, diarrhea  or change in bowel or urinary habits, change in stools or urine, dysuria,hematuria,  rash, arthralgias, visual complaints, headache, numbness weakness or ataxia or problems with walking or coordination,  change in mood/affect or memory.                          Objective:   Physical Exam   Thin amb wf 97 02/08/2012 > 03/24/2012  99 > 103 05/09/2012 > 06/12/2012  99 >   09/24/2012  96 > 98 01/05/2013 >  106 05/01/2013  Congested rattling cough HEENT mild turbinate edema.  Oropharynx no thrush or excess pnd or cobblestoning.  No JVD or cervical adenopathy. Mild accessory muscle hypertrophy. Trachea midline, nl thryroid. Chest was hyperinflated by percussion with diminished breath sounds and moderate increased exp time without wheeze. Hoover sign positive at mid inspiration. Regular rate and rhythm without murmur gallop or rub or increase P2 edema resolved.  Abd: no hsm, nl excursion. Ext warm without cyanosis or clubbing.    cxr 08/11/12 Stable COPD. No active disease.       Assessment & Plan:  st.

## 2013-05-01 NOTE — Patient Instructions (Addendum)
Wear 02  2lpm 24 hours per day  Start symbicort 160 Take 2 puffs first thing in am and then another 2 puffs about 12 hours later.   Continue spiriva one capsule immediately after your am dose of symbicort  Work on inhaler technique:  relax and gently blow all the way out then take a nice smooth deep breath back in, triggering the inhaler at same time you start breathing in.  Hold for up to 5 seconds if you can.  Rinse and gargle with water when done   If your mouth or throat starts to bother you,   I suggest you time the inhaler to your dental care and after using the inhaler(s) brush teeth and tongue with a baking soda containing toothpaste and when you rinse this out, gargle with it first to see if this helps your mouth and throat.     Please schedule a follow up office visit in 4 weeks, sooner if needed

## 2013-05-02 NOTE — Assessment & Plan Note (Signed)
-   PFTs 03/24/2012  FEV1  0.91 (43%) ratio 48 and no better p B2,  DLCO 38% corrects to 53%  - HFA 50% effective 05/09/2012 but having trouble triggering tudorza  > d/c - HFA 05/01/2013 50%  DDX of  difficult airways managment all start with A and  include Adherence, Ace Inhibitors, Acid Reflux, Active Sinus Disease, Alpha 1 Antitripsin deficiency, Anxiety masquerading as Airways dz,  ABPA,  allergy(esp in young), Aspiration (esp in elderly), Adverse effects of DPI,  Active smokers, plus two Bs  = Bronchiectasis and Beta blocker use..and one C= CHF   Adherence is always the initial "prime suspect" and is a multilayered concern that requires a "trust but verify" approach in every patient - starting with knowing how to use medications, especially inhalers, correctly, keeping up with refills and understanding the fundamental difference between maintenance and prns vs those medications only taken for a very short course and then stopped and not refilled.   The proper method of use, as well as anticipated side effects, of a metered-dose inhaler are discussed and demonstrated to the patient. Improved effectiveness after extensive coaching during this visit to a level of approximately  50% so may need to change to dpi or neb next ov

## 2013-05-02 NOTE — Assessment & Plan Note (Signed)
-   ono RA 05/14/12   4 h 33 min < 89%   So ordered 02 2lpm    - HCO3  28  05/27/12   - 01/05/2013   Walked RA x one lap @ 185 stopped due to  88%   - 01/05/2013  Walked 2lpm 2 laps @ 185 ft each stopped due to  90% sob but improved vs baseline   - RA 05/01/2013 sats = 87%    Rx= 02 2lpm 24/7  For now

## 2013-05-04 ENCOUNTER — Telehealth: Payer: Self-pay | Admitting: Internal Medicine

## 2013-05-04 DIAGNOSIS — J449 Chronic obstructive pulmonary disease, unspecified: Secondary | ICD-10-CM

## 2013-05-04 DIAGNOSIS — J9610 Chronic respiratory failure, unspecified whether with hypoxia or hypercapnia: Secondary | ICD-10-CM

## 2013-05-04 NOTE — Telephone Encounter (Signed)
LMTCB

## 2013-05-04 NOTE — Telephone Encounter (Signed)
i will need an order for this

## 2013-05-05 ENCOUNTER — Ambulatory Visit (INDEPENDENT_AMBULATORY_CARE_PROVIDER_SITE_OTHER): Payer: Medicare Other | Admitting: Internal Medicine

## 2013-05-05 ENCOUNTER — Encounter: Payer: Self-pay | Admitting: Internal Medicine

## 2013-05-05 ENCOUNTER — Encounter (HOSPITAL_COMMUNITY): Payer: Self-pay | Admitting: *Deleted

## 2013-05-05 ENCOUNTER — Telehealth: Payer: Self-pay | Admitting: Internal Medicine

## 2013-05-05 ENCOUNTER — Inpatient Hospital Stay (HOSPITAL_COMMUNITY)
Admission: AD | Admit: 2013-05-05 | Discharge: 2013-05-11 | DRG: 189 | Disposition: A | Discharge status: Home Health | Payer: Medicare Other | Source: Ambulatory Visit | Attending: Internal Medicine | Admitting: Internal Medicine

## 2013-05-05 ENCOUNTER — Inpatient Hospital Stay (HOSPITAL_COMMUNITY): Payer: Medicare Other

## 2013-05-05 VITALS — BP 122/92 | HR 104 | Temp 98.0°F | Ht 67.0 in | Wt 99.6 lb

## 2013-05-05 DIAGNOSIS — T17408A Unspecified foreign body in trachea causing other injury, initial encounter: Secondary | ICD-10-CM | POA: Diagnosis present

## 2013-05-05 DIAGNOSIS — Z87891 Personal history of nicotine dependence: Secondary | ICD-10-CM | POA: Diagnosis not present

## 2013-05-05 DIAGNOSIS — J9610 Chronic respiratory failure, unspecified whether with hypoxia or hypercapnia: Secondary | ICD-10-CM

## 2013-05-05 DIAGNOSIS — IMO0002 Reserved for concepts with insufficient information to code with codable children: Secondary | ICD-10-CM | POA: Diagnosis present

## 2013-05-05 DIAGNOSIS — J438 Other emphysema: Secondary | ICD-10-CM | POA: Diagnosis not present

## 2013-05-05 DIAGNOSIS — J449 Chronic obstructive pulmonary disease, unspecified: Secondary | ICD-10-CM | POA: Diagnosis present

## 2013-05-05 DIAGNOSIS — J441 Chronic obstructive pulmonary disease with (acute) exacerbation: Secondary | ICD-10-CM

## 2013-05-05 DIAGNOSIS — R0989 Other specified symptoms and signs involving the circulatory and respiratory systems: Secondary | ICD-10-CM | POA: Diagnosis not present

## 2013-05-05 DIAGNOSIS — R9389 Abnormal findings on diagnostic imaging of other specified body structures: Secondary | ICD-10-CM | POA: Diagnosis present

## 2013-05-05 DIAGNOSIS — R918 Other nonspecific abnormal finding of lung field: Secondary | ICD-10-CM | POA: Diagnosis not present

## 2013-05-05 DIAGNOSIS — R0602 Shortness of breath: Secondary | ICD-10-CM | POA: Diagnosis not present

## 2013-05-05 DIAGNOSIS — R0609 Other forms of dyspnea: Secondary | ICD-10-CM | POA: Diagnosis not present

## 2013-05-05 DIAGNOSIS — F17200 Nicotine dependence, unspecified, uncomplicated: Secondary | ICD-10-CM

## 2013-05-05 DIAGNOSIS — J9620 Acute and chronic respiratory failure, unspecified whether with hypoxia or hypercapnia: Secondary | ICD-10-CM | POA: Diagnosis not present

## 2013-05-05 DIAGNOSIS — Z9981 Dependence on supplemental oxygen: Secondary | ICD-10-CM

## 2013-05-05 DIAGNOSIS — R059 Cough, unspecified: Secondary | ICD-10-CM | POA: Diagnosis not present

## 2013-05-05 DIAGNOSIS — N289 Disorder of kidney and ureter, unspecified: Secondary | ICD-10-CM

## 2013-05-05 LAB — CBC WITH DIFFERENTIAL/PLATELET
Basophils Relative: 0 % (ref 0–1)
Eosinophils Absolute: 0.4 10*3/uL (ref 0.0–0.7)
Eosinophils Relative: 3 % (ref 0–5)
Hemoglobin: 14.5 g/dL (ref 12.0–15.0)
Lymphs Abs: 0.8 10*3/uL (ref 0.7–4.0)
MCH: 30.5 pg (ref 26.0–34.0)
MCHC: 33.1 g/dL (ref 30.0–36.0)
MCV: 92 fL (ref 78.0–100.0)
Monocytes Relative: 12 % (ref 3–12)
RBC: 4.76 MIL/uL (ref 3.87–5.11)

## 2013-05-05 LAB — COMPREHENSIVE METABOLIC PANEL
ALT: 15 U/L (ref 0–35)
AST: 20 U/L (ref 0–37)
Albumin: 3.7 g/dL (ref 3.5–5.2)
Calcium: 9.7 mg/dL (ref 8.4–10.5)
Chloride: 98 mEq/L (ref 96–112)
Creatinine, Ser: 0.69 mg/dL (ref 0.50–1.10)
Sodium: 138 mEq/L (ref 135–145)
Total Bilirubin: 0.5 mg/dL (ref 0.3–1.2)

## 2013-05-05 LAB — PRO B NATRIURETIC PEPTIDE: Pro B Natriuretic peptide (BNP): 168.8 pg/mL (ref 0–450)

## 2013-05-05 MED ORDER — DEXTROSE 5 % IV SOLN
INTRAVENOUS | Status: DC
  Administered 2013-05-05: 16:00:00 via INTRAVENOUS

## 2013-05-05 MED ORDER — LEVALBUTEROL HCL 1.25 MG/0.5ML IN NEBU
1.2500 mg | INHALATION_SOLUTION | Freq: Four times a day (QID) | RESPIRATORY_TRACT | Status: DC | PRN
  Administered 2013-05-11: 1.25 mg via RESPIRATORY_TRACT
  Filled 2013-05-05: qty 0.5

## 2013-05-05 MED ORDER — TIOTROPIUM BROMIDE MONOHYDRATE 18 MCG IN CAPS
18.0000 ug | ORAL_CAPSULE | Freq: Every day | RESPIRATORY_TRACT | Status: DC
  Administered 2013-05-06 – 2013-05-11 (×6): 18 ug via RESPIRATORY_TRACT
  Filled 2013-05-05 (×2): qty 5

## 2013-05-05 MED ORDER — HEPARIN SODIUM (PORCINE) 5000 UNIT/ML IJ SOLN
5000.0000 [IU] | Freq: Three times a day (TID) | INTRAMUSCULAR | Status: DC
  Administered 2013-05-06 – 2013-05-11 (×15): 5000 [IU] via SUBCUTANEOUS
  Filled 2013-05-05 (×21): qty 1

## 2013-05-05 MED ORDER — GUAIFENESIN ER 600 MG PO TB12
1200.0000 mg | ORAL_TABLET | Freq: Two times a day (BID) | ORAL | Status: DC
  Administered 2013-05-06 – 2013-05-11 (×12): 1200 mg via ORAL
  Filled 2013-05-05 (×14): qty 2

## 2013-05-05 MED ORDER — BUDESONIDE 0.5 MG/2ML IN SUSP
0.5000 mg | Freq: Two times a day (BID) | RESPIRATORY_TRACT | Status: DC
  Administered 2013-05-05 – 2013-05-11 (×12): 0.5 mg via RESPIRATORY_TRACT
  Filled 2013-05-05 (×16): qty 2

## 2013-05-05 MED ORDER — PANTOPRAZOLE SODIUM 40 MG PO TBEC
40.0000 mg | DELAYED_RELEASE_TABLET | Freq: Two times a day (BID) | ORAL | Status: DC
  Administered 2013-05-05 – 2013-05-07 (×5): 40 mg via ORAL
  Filled 2013-05-05 (×6): qty 1

## 2013-05-05 MED ORDER — METHYLPREDNISOLONE SODIUM SUCC 125 MG IJ SOLR
80.0000 mg | Freq: Three times a day (TID) | INTRAMUSCULAR | Status: DC
  Administered 2013-05-05 – 2013-05-08 (×9): 80 mg via INTRAVENOUS
  Filled 2013-05-05 (×13): qty 1.28

## 2013-05-05 MED ORDER — ARFORMOTEROL TARTRATE 15 MCG/2ML IN NEBU
15.0000 ug | INHALATION_SOLUTION | Freq: Two times a day (BID) | RESPIRATORY_TRACT | Status: DC
  Administered 2013-05-05 – 2013-05-11 (×12): 15 ug via RESPIRATORY_TRACT
  Filled 2013-05-05 (×17): qty 2

## 2013-05-05 MED ORDER — DEXTROSE 5 % IV SOLN
2.0000 g | INTRAVENOUS | Status: DC
  Administered 2013-05-05 – 2013-05-10 (×6): 2 g via INTRAVENOUS
  Filled 2013-05-05 (×7): qty 2

## 2013-05-05 NOTE — Telephone Encounter (Signed)
Pt returned call Advised that the order has been placed for DME switch and this may take a few days  Side note:  Patient stated "I may have to go to the hospital" Pt c/o acute increased SOB onset last night Wheezing, prod cough Advised pt would like her to come for ov rather than send her to the ER (has COPD Gold III) Ov scheduled with MW this afternoon at 2:15pm Pt advised to call or seek emergency help if she is not able to wait until 2:15pm Pt verbalized her understanding  Will sign and froward to Dr Sherene Sires as Lorain Childes

## 2013-05-05 NOTE — Telephone Encounter (Signed)
I remember this patient from her ov w/ MW Her O2 tank at ov w/ MW on 5.9.14 was empty > Christoper Allegra suggested that she get her sister who brought her to appt to go home and get her another tank; they would not be able to bring tank to the office for another 1.5hrs.  Pt had stated at ov that she did not like [employer] and would like to change companies.  Will place order  LMOM TCB x1 for pt

## 2013-05-05 NOTE — Progress Notes (Signed)
Subjective:    Patient ID: [name]    DOB: 1932-05-27   MRN: [id number]  HPI  62 yowf active smoker referred 02/08/2012 by [name] for intermittent sob was on 02 but stopped in early 2013    02/08/2012 1st pulmonary ov/ cc "intermittently" bad breathing x 2-3 y  but on  best day can do  [employer] full aisles  but not mall with episodes much worse last starting 3 weeks prior to OV  rx already by abx and prednisone and some better.  Assoc with congested cough slt discolored better p abx but really has year round am cough x years, worse in winter rec Moderate copd/ emphysema (not severe) I reviewed the Flethcher curve    Work on inhaler technique:  Late add:  Needs cxr on return  03/24/2012 f/u ov/[name] cc doe not changed only  using albuterol but 4 x weekly, minimal am cough, mucoid. No active sinus or hb symptoms rec pt wants alternative to spiriva...feet and ankles swelling since beginning treatment 03/24/12 Pt stopped using 05/06/12   05/09/2012 f/u ov/[name] cc leg swelling p first box of spiriva but did feel it helped breathing, swelling is worse on L sltly, better since held the spiriva. 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 No cough, overt sinus or reflux symptoms. rec Try tudorza one twice daily - if you don't see the green turn red it didn't work  Please see patient coordinator before you leave today  to schedule overnight oximetry on room air Please schedule a follow up office visit in 4 weeks, sooner if needed   06/12/2012 f/u ov/[name] still smoking cc sleeping ok on 02 no am wakes up feeling rested no ha.  [place]oe x > [employer], usually does not use [facility] parking. No variability or overt sinus or hb symptoms or need for daytime saba.  Not interested in daytime 02 or committed to stop smoking at this point. Likes spiriva > tudorza because not able to trigger the tudorza consistently. rec The key is to stop smoking completely before smoking completely stops you - no  change in meds    08/11/2012 f/u ov/[name] cc missed appt  09/24/2012 f/u ov/[name] still smoking cc doe x ok at [employer] pushing cart rec Work on inhaler technique/ f/u prn   01/05/2013 f/u ov/[name] cc no change in breathing although had aecopd and was provided with nebulizer but no clearly what she's using in it >  back to baseline doe ([employer] shopping)and using hfa twice daily and neb once daily. Has congested cough, still smoking rec Change 02 regimen:  2lpm baseline and walking outside of home  Plan A  Continue Spriva one capsule daily each am and stop smoking Only use your albuterol (plan B proaire, plan C is the nebulizer)  as a rescue medication to be used if you can't catch your breath by resting or doing a relaxed purse lip breathing pattern. The less you use it, the better it will work when you need it. Ok to use up to every 4 hours. For cough/ congestion >  mucinex 600 mg 1-2 every 12 hours as needed Call back with name of neb   05/01/2013 f/u ov/[name] re copd/ most 2lpm 20 h/d  always wears sleeping no longer smoking 60 days Chief Complaint  Patient presents with  . Follow-up    Breathing worse x 1 wk. Out of breath walking from parking lot to our building this morning. Sats 79%  on ra- she has her portable o2 but not sure how to use it. Sats increased to 92%2lpm..   started prednisone 3 days prior to OV  Per S [name], not really better yet, not using spiriva correctly, confused with action plan and still doesn't know names of meds/ different types of albuterol. No change in chronic cough with this flare and really not doing much better since quit smoking  rec Wear 02  2lpm 24 hours per day Start symbicort 160 Take 2 puffs first thing in am and then another 2 puffs about 12 hours later.  Continue spiriva one capsule immediately after your am dose of symbicort Work on inhaler technique.   05/05/2013 f/u ov/[name] acute w/in ov Chief Complaint  Patient presents with  . Acute Visit     Pt c/o SOB progressively worse since the last visit. She states having alot of nasal congestion for the past 2 days.    assoc with nasal obst symptoms and yellow mucus, used saba only once so far today  No obvious daytime variabilty or assoc  cp or chest tightness, subjective wheeze overt sinus or hb symptoms. No unusual exp hx .   Sleeping ok on 2lpm without nocturnal  or early am exacerbation  of respiratory  c/o's or need for noct saba. Also denies any obvious fluctuation of symptoms with weather or environmental changes or other aggravating or alleviating factors except as outlined above.    Current Medications, Allergies, Past Medical History, Past Surgical History, Family History, and Social History were reviewed in [employer] record.  ROS  The following are not active complaints unless bolded sore throat, dysphagia, dental problems, itching, sneezing,  nasal congestion or excess/ purulent secretions, ear ache,   fever, chills, sweats, unintended wt loss, pleuritic or exertional cp, hemoptysis,  orthopnea pnd or leg swelling, presyncope, palpitations, heartburn, abdominal pain, anorexia, nausea, vomiting, diarrhea  or change in bowel or urinary habits, change in stools or urine, dysuria,hematuria,  rash, arthralgias, visual complaints, headache, numbness weakness or ataxia or problems with walking or coordination,  change in mood/affect or memory.              Objective:   Physical Exam   Thin amb wf 97 02/08/2012 > 03/24/2012  99 > 103 05/09/2012 > 06/12/2012  99 >   09/24/2012  96 > 98 01/05/2013 >  106 05/01/2013 > 05/05/2013  99  Congested rattling cough HEENT mild turbinate edema.  Oropharynx no thrush or excess pnd or cobblestoning.  No JVD or cervical adenopathy. Mild accessory muscle hypertrophy. Trachea midline, nl thryroid. Chest was hyperinflated by percussion with diminished breath sounds and moderate increased exp time without wheeze. Hoover sign positive  at mid inspiration. Regular rate and rhythm without murmur gallop or rub or increase P2 edema resolved.  Abd: no hsm, nl excursion. Ext warm without cyanosis or clubbing.    02/28/13 vct 1. Chronic changes of COPD redemonstrated, without radiographic  evidence of acute cardiopulmonary disease.  2. Atherosclerosis.        Assessment & Plan:  st.

## 2013-05-05 NOTE — Telephone Encounter (Signed)
Patient Instructions    Wear 02 2lpm 24 hours per day   ---   I advised Melissa of this. She asked if this could be put in an order. I have done so. Nothing further was needed

## 2013-05-05 NOTE — H&P (Signed)
PULMONARY  / CRITICAL CARE MEDICINE  Name: [name] MRN: [id number] DOB: 22-Oct-1932    ADMISSION DATE:  (Not on file)    REFERRING MD :  Lindie Spruce PRIMARY SERVICE: Lindie Spruce  CHIEF COMPLAINT:  Dyspnea/ cough  BRIEF PATIENT DESCRIPTION: 89 yowf with severe copd/ 02 dependent admit with refractory AB/ worsening hypoxemic resp failure afer being seen as w/in pm 5/13  SIGNIFICANT EVENTS / STUDIES:       ANTIBIOTICS: Levaquin 5/13 >>>  HISTORY OF PRESENT ILLNESS:  80 yowf active smoker referred 02/08/2012 by [name] for intermittent sob was on 02 but stopped in early 2013    02/08/2012 1st pulmonary ov/ cc "intermittently" bad breathing x 2-3 y  but on  best day can do  [employer] full aisles  but not mall with episodes much worse last starting 3 weeks prior to OV  rx already by abx and prednisone and some better.  Assoc with congested cough slt discolored better p abx but really has year round am cough x years, worse in winter rec Moderate copd/ emphysema (not severe) I reviewed the Flethcher curve    Work on inhaler technique:      01/05/2013 f/u ov/Calton Harshfield cc no change in breathing although had aecopd and was provided with nebulizer but no clearly what she's using in it >  back to baseline doe ([employer] shopping)and using hfa twice daily and neb once daily. Has congested cough, still smoking rec Change 02 regimen:  2lpm baseline and walking outside of home  Plan A  Continue Spriva one capsule daily each am and stop smoking Only use your albuterol (plan B proaire, plan C is the nebulizer)  as a rescue medication to be used if you can't catch your breath by resting or doing a relaxed purse lip breathing pattern. The less you use it, the better it will work when you need it. Ok to use up to every 4 hours. For cough/ congestion >  mucinex 600 mg 1-2 every 12 hours as needed Call back with name of neb   05/01/2013 f/u ov/[name] re copd/ most 2lpm 20 h/d  always wears sleeping no longer  smoking 60 days Chief Complaint  Patient presents with  . Follow-up    Breathing worse x 1 wk. Out of breath walking from parking lot to our building this morning. Sats 79% on ra- she has her portable o2 but not sure how to use it. Sats increased to 92%2lpm..   started prednisone 3 days prior to OV  Per S Tysinger, not really better yet, not using spiriva correctly, confused with action plan and still doesn't know names of meds/ different types of albuterol. No change in chronic cough with this flare and really not doing much better since quit smoking  rec Wear 02  2lpm 24 hours per day Start symbicort 160 Take 2 puffs first thing in am and then another 2 puffs about 12 hours later.  Continue spiriva one capsule immediately after your am dose of symbicort Work on inhaler technique.   05/05/2013 f/u ov/[name] acute w/in ov Chief Complaint  Patient presents with  . Acute Visit    Pt c/o SOB progressively worse since the last visit. She states having alot of nasal congestion for the past 2 days.    assoc with nasal obst symptoms and yellow mucus, used saba only once so far today  No obvious daytime variabilty or assoc  cp or chest tightness, subjective wheeze overt sinus or hb symptoms. No  unusual exp hx .   Also denies any obvious fluctuation of symptoms with weather or environmental changes or other aggravating or alleviating factors except as outlined above.  Current Medications, Allergies, Past Medical History, Past Surgical History, Family History, and Social History were reviewed in [employer] record.  ROS  The following are not active complaints unless bolded sore throat, dysphagia, dental problems, itching, sneezing,  nasal congestion or excess/ purulent secretions, ear ache,   fever, chills, sweats, unintended wt loss, pleuritic or exertional cp, hemoptysis,  orthopnea pnd or leg swelling, presyncope, palpitations, heartburn, abdominal pain, anorexia, nausea,  vomiting, diarrhea  or change in bowel or urinary habits, change in stools or urine, dysuria,hematuria,  rash, arthralgias, visual complaints, headache, numbness weakness or ataxia or problems with walking or coordination,  change in mood/affect or memory.  PAST MEDICAL HISTORY :  Past Medical History  Diagnosis Date  . Osteoporosis   . Cor pulmonale   . Asthma   . H/O bone density study 2008    osteoporosis, improved density on Fosamax at that time  . COPD (chronic obstructive pulmonary disease) 01/2010    Dr. Sherene Sires;  . Hypoxia 01/2013    hospitalization, Bipap therapy, COPD exacerbation  . Former smoker     quit 02/2013   Past Surgical History  Procedure Laterality Date  . Colonoscopy  01/22/07    severe diverticulosis, external hemorrhoids, Dr. Loreta Ave; advised repeat 2013   Prior to Admission medications   Medication Sig Start Date End Date Taking? Authorizing Provider  albuterol (PROVENTIL HFA;VENTOLIN HFA) 108 (90 BASE) MCG/ACT inhaler Inhale 2 puffs into the lungs every 4 (four) hours as needed. 03/02/13   Alison Murray, MD  albuterol (PROVENTIL) (2.5 MG/3ML) 0.083% nebulizer solution Take 2.5 mg by nebulization every 6 (six) hours as needed.    Historical Provider, MD  budesonide-formoterol (SYMBICORT) 160-4.5 MCG/ACT inhaler Take 2 puffs first thing in am and then another 2 puffs about 12 hours later. 05/01/13   Nyoka Cowden, MD  calcium-vitamin D (OSCAL WITH D) 250-125 MG-UNIT per tablet Take 1 tablet by mouth daily.    Historical Provider, MD  dextromethorphan-guaiFENesin (MUCINEX DM) 30-600 MG per 12 hr tablet Take 1 tablet by mouth every 12 (twelve) hours as needed.    Historical Provider, MD  Multiple Vitamin (MULTIVITAMIN) capsule Take 1 capsule by mouth daily.    Historical Provider, MD  predniSONE (DELTASONE) 10 MG tablet Tapered dose per Dr [name] 04/28/13   Kermit Balo Tysinger, PA-C  tiotropium (SPIRIVA) 18 MCG inhalation capsule Place 18 mcg into inhaler and inhale daily.  03/24/12 05/01/14  Nyoka Cowden, MD   Allergies  Allergen Reactions  . Levofloxacin Other (See Comments)    Lowered blood pressre    FAMILY HISTORY:  Family History  Problem Relation Age of Onset  . Diabetes Mother   . Arthritis Father   . Stroke Father   . Colon cancer Mother    SOCIAL HISTORY:  reports that she has quit smoking. Her smoking use included Cigarettes. She has a 15 pack-year smoking history. She has never used smokeless tobacco. She reports that she does not drink alcohol or use illicit drugs.  REVIEW OF SYSTEMS:  As above  SUBJECTIVE:  Sob at rest, very congested cough  VITAL SIGNS: Wt Readings from Last 3 Encounters:  05/05/13 99 lb (44.906 kg)  05/05/13 99 lb 9.6 oz (45.178 kg)  05/01/13 105 lb 12.8 oz (47.991 kg)    Temp:  [  98 F (36.7 C)] 98 F (36.7 C) (05/13 1428) Pulse Rate:  [104] 104 (05/13 1428) BP: (122)/(92) 122/92 mmHg (05/13 1428) SpO2:  [90 %] 90 % (05/13 1428) Weight:  [99 lb 9.6 oz (45.178 kg)] 99 lb 9.6 oz (45.178 kg) (05/13 1428) 02 rx  4lpm NP   INTAKE / OUTPUT: Intake/Output   None     PHYSICAL EXAMINATION: General: thin amb wf moderate increase wob at rest  HEENT mild turbinate edema.  Oropharynx no thrush or excess pnd or cobblestoning.  No JVD or cervical adenopathy. Mild accessory muscle hypertrophy. Trachea midline, nl thryroid. Chest was hyperinflated by percussion with diminished breath sounds and moderate increased exp time without wheeze. Hoover sign positive at mid inspiration. Regular rate and rhythm without murmur gallop or rub or increase P2 or edema.  Abd: no hsm, nl excursion. Ext warm without cyanosis or clubbing.    LABS:  Recent Labs Lab 05/05/13 1550  HGB 14.5  WBC 14.1*  PLT 257  NA 138  K 4.4  CL 98  CO2 30  GLUCOSE 148*  BUN 17  CREATININE 0.69  CALCIUM 9.7  AST 20  ALT 15  ALKPHOS 94  BILITOT 0.5  PROT 7.7  ALBUMIN 3.7  PROBNP 168.8   No results found for this basename: GLUCAP,  in the  last 168 hours  CXR:  05/05/13 Findings suspicious for a new cavitary lesion in the left upper  lobe. Irregular linear density at the left base is also new   ASSESSMENT / PLAN:  1) Acute on chronic respiratory failure/ aecopd - sp recent smoking cessation -abn CXR ? Underlying ca or MAI?  Rec: IV steroids, check sputum, nebs, quantiferon assay rx rocephin for now  Titrate 02    2) GOLD III COPD?/02 dep at baseline -- PFTs 03/24/2012  FEV1  0.91 (43%) ratio 48 and no better p B2,  DLCO 38% corrects to 53%     Sandrea Hughs, MD Pulmonary and Critical Care Medicine [facility] Cell [phone] After 5:30 PM or weekends, call [phone]

## 2013-05-06 ENCOUNTER — Inpatient Hospital Stay (HOSPITAL_COMMUNITY): Payer: Medicare Other

## 2013-05-06 DIAGNOSIS — J9620 Acute and chronic respiratory failure, unspecified whether with hypoxia or hypercapnia: Secondary | ICD-10-CM | POA: Diagnosis not present

## 2013-05-06 DIAGNOSIS — J449 Chronic obstructive pulmonary disease, unspecified: Secondary | ICD-10-CM

## 2013-05-06 DIAGNOSIS — R05 Cough: Secondary | ICD-10-CM | POA: Diagnosis not present

## 2013-05-06 DIAGNOSIS — J438 Other emphysema: Secondary | ICD-10-CM | POA: Diagnosis not present

## 2013-05-06 DIAGNOSIS — R918 Other nonspecific abnormal finding of lung field: Secondary | ICD-10-CM | POA: Diagnosis not present

## 2013-05-06 DIAGNOSIS — R0602 Shortness of breath: Secondary | ICD-10-CM | POA: Diagnosis not present

## 2013-05-06 MED ORDER — IOHEXOL 350 MG/ML SOLN
100.0000 mL | Freq: Once | INTRAVENOUS | Status: AC | PRN
  Administered 2013-05-06: 100 mL via INTRAVENOUS

## 2013-05-06 NOTE — Progress Notes (Signed)
INITIAL NUTRITION ASSESSMENT  Pt meets criteria for moderate MALNUTRITION in the context of chronic illness as evidenced by visible mild/moderate muscle wasting and subcutaneous fat loss in arms, hands, and legs.  DOCUMENTATION CODES Per approved criteria  -Non-severe (moderate) malnutrition in the context of chronic illness -Underweight   INTERVENTION: - Carnation Instant Breakfast daily - Encouraged high calorie/protein diet - Will continue to monitor   NUTRITION DIAGNOSIS: Increased nutrient needs related to COPD as evidenced by medical history.    Goal: Pt to consume >90% of meals/supplements  Monitor:  Weights, labs, intake   Reason for Assessment: Nutrition risk   79 y.o. female  Admitting Dx: Dyspnea/cough  ASSESSMENT: Pt with history of severe COPD/O2 dependent admitted with worsening hypoxemic respiratory failure. Pt thin. Pt reports eating 2 meals/day and drinking 1 Hospital doctor. Pt states she has a good appetite and that her weight has been stable. Pt did c/o some weakness at home. Pt denies any problems chewing/swallowing.   Performed nutrition focused physical exam.   Nutrition Focused Physical Exam:  Subcutaneous Fat:  Orbital Region: mild/moderate wasting Upper Arm Region: mild/moderate wasting Thoracic and Lumbar Region: NA  Muscle:  Temple Region: mild/moderate wasting Clavicle Bone Region: mild/moderate wasting Clavicle and Acromion Bone Region: mild/moderate wasting Scapular Bone Region: NA Dorsal Hand: mild/moderate wasting Patellar Region: mild/moderate wasting Anterior Thigh Region: mild/moderate wasting Posterior Calf Region: mild/moderate wasting  Edema: None observed    Height: Ht Readings from Last 1 Encounters:  05/05/13 5\' 7"  (1.702 m)    Weight: Wt Readings from Last 1 Encounters:  05/05/13 110 lb 14.3 oz (50.3 kg)    Ideal Body Weight: 135 lb  % Ideal Body Weight: 81  Wt Readings from Last 10  Encounters:  05/05/13 110 lb 14.3 oz (50.3 kg)  05/05/13 99 lb 9.6 oz (45.178 kg)  05/01/13 105 lb 12.8 oz (47.991 kg)  04/28/13 106 lb 8 oz (48.308 kg)  04/07/13 106 lb (48.081 kg)  03/11/13 97 lb (43.999 kg)  02/28/13 88 lb 2.9 oz (40 kg)  01/05/13 98 lb (44.453 kg)  12/31/12 97 lb (43.999 kg)  12/22/12 93 lb (42.185 kg)    Usual Body Weight: 110 lb  % Usual Body Weight: 100  BMI:  Body mass index is 17.36 kg/(m^2). Underweight  Estimated Nutritional Needs: Kcal: 1500-1750 Protein: 60-75g Fluid: 1.5-1.7L/day  Skin: Intact   Diet Order: General  EDUCATION NEEDS: -Education needs addressed - briefly discussed high calorie/protein diet    Intake/Output Summary (Last 24 hours) at 05/06/13 1726 Last data filed at 05/06/13 1300  Gross per 24 hour  Intake    960 ml  Output    501 ml  Net    459 ml    Last BM: 5/12  Labs:   Recent Labs Lab 05/05/13 1550  NA 138  K 4.4  CL 98  CO2 30  BUN 17  CREATININE 0.69  CALCIUM 9.7  GLUCOSE 148*    CBG (last 3)  No results found for this basename: GLUCAP,  in the last 72 hours  Scheduled Meds: . arformoterol  15 mcg Nebulization BID  . budesonide  0.5 mg Nebulization BID  . cefTRIAXone (ROCEPHIN)  IV  2 g Intravenous Q24H  . guaiFENesin  1,200 mg Oral BID  . heparin subcutaneous  5,000 Units Subcutaneous Q8H  . methylPREDNISolone (SOLU-MEDROL) injection  80 mg Intravenous Q8H  . pantoprazole  40 mg Oral BID AC  . tiotropium  18 mcg Inhalation Daily  Continuous Infusions: . dextrose 10 mL/hr at 05/05/13 1617    Past Medical History  Diagnosis Date  . Osteoporosis   . Cor pulmonale   . Asthma   . H/O bone density study 2008    osteoporosis, improved density on Fosamax at that time  . COPD (chronic obstructive pulmonary disease) 01/2010    Dr. Sherene Sires;  . Hypoxia 01/2013    hospitalization, Bipap therapy, COPD exacerbation  . Former smoker     quit 02/2013    Past Surgical History  Procedure  Laterality Date  . Colonoscopy  01/22/07    severe diverticulosis, external hemorrhoids, Dr. Loreta Ave; advised repeat 2013  . Cholecystectomy  1970's     Levon Hedger MS, RD, LDN [phone] Pager [phone] After Hours Pager

## 2013-05-06 NOTE — Progress Notes (Signed)
PULMONARY  / CRITICAL CARE MEDICINE  Name: [name] MRN: [id number] DOB: 07/18/1932    ADMISSION DATE:  05/05/2013    REFERRING MD :  Lindie Spruce PRIMARY SERVICE: Lindie Spruce  CHIEF COMPLAINT:  Dyspnea/ cough  BRIEF PATIENT DESCRIPTION: 62 yowf with severe copd/ 02 dependent admit with refractory AB/ worsening hypoxemic resp failure afer being seen as w/in pm 5/13  SIGNIFICANT EVENTS / STUDIES:    ANTIBIOTICS: Levaquin 5/13 >>  SUBJECTIVE:  Sob at rest, very congested cough  VITAL SIGNS: Wt Readings from Last 3 Encounters:  05/05/13 50.3 kg (110 lb 14.3 oz)  05/05/13 45.178 kg (99 lb 9.6 oz)  05/01/13 47.991 kg (105 lb 12.8 oz)    Temp:  [97.1 F (36.2 C)-98.1 F (36.7 C)] 97.1 F (36.2 C) (05/14 0500) Pulse Rate:  [77-120] 77 (05/14 0500) Resp:  [16-24] 16 (05/14 0500) BP: (108-151)/(60-103) 108/76 mmHg (05/14 0500) SpO2:  [83 %-98 %] 92 % (05/14 0828) Weight:  [44.906 kg (99 lb)-50.3 kg (110 lb 14.3 oz)] 50.3 kg (110 lb 14.3 oz) (05/13 2014) 02 rx  4lpm NP   INTAKE / OUTPUT: Intake/Output     05/13 0701 - 05/14 0700 05/14 0701 - 05/15 0700   P.O. 240 360   Total Intake(mL/kg) 240 (4.8) 360 (7.2)   Urine (mL/kg/hr) 150 100 (0.6)   Stool  1 (0)   Total Output 150 101   Net +90 +259          PHYSICAL EXAMINATION: General: thin amb wf moderate increase wob at rest  HEENT  No JVD or cervical adenopathy. Mild accessory muscle hypertrophy. Trachea midline, nl thryroid.  Chest was hyperinflated by percussion with diminished breath sounds and moderate increased exp time without wheeze.   Regular rate and rhythm without murmur gallop or rub or increase P2 or edema.   Abd: no hsm, nl excursion. Ext warm without cyanosis or clubbing.    LABS:  Recent Labs Lab 05/05/13 1550  HGB 14.5  WBC 14.1*  PLT 257  NA 138  K 4.4  CL 98  CO2 30  GLUCOSE 148*  BUN 17  CREATININE 0.69  CALCIUM 9.7  AST 20  ALT 15  ALKPHOS 94  BILITOT 0.5  PROT 7.7  ALBUMIN 3.7   PROBNP 168.8   No results found for this basename: GLUCAP,  in the last 168 hours  CXR:  05/05/13 Findings suspicious for a new cavitary lesion in the left upper lobe. Irregular linear density at the left base is also new   ASSESSMENT / PLAN:  1) Acute on chronic respiratory failure/ aecopd - sp recent smoking cessation -abn CXR ? Underlying ca or MAI? plan IV steroids, check sputum, nebs, quantiferon assay rx rocephin for now  Titrate 02  CT chest and sinus 5/14 >>   2) GOLD III COPD /02 dep at baseline -- PFTs 03/24/2012  FEV1  0.91 (43%) ratio 48 and no better p B2,  DLCO 38% corrects to 53%

## 2013-05-07 DIAGNOSIS — J441 Chronic obstructive pulmonary disease with (acute) exacerbation: Secondary | ICD-10-CM | POA: Diagnosis not present

## 2013-05-07 DIAGNOSIS — J9620 Acute and chronic respiratory failure, unspecified whether with hypoxia or hypercapnia: Secondary | ICD-10-CM | POA: Diagnosis not present

## 2013-05-07 MED ORDER — FAMOTIDINE 20 MG PO TABS
20.0000 mg | ORAL_TABLET | Freq: Two times a day (BID) | ORAL | Status: DC
  Administered 2013-05-07 – 2013-05-11 (×8): 20 mg via ORAL
  Filled 2013-05-07 (×9): qty 1

## 2013-05-07 NOTE — Progress Notes (Signed)
PULMONARY  / CRITICAL CARE MEDICINE  Name: [name] MRN: [id number] DOB: 1932/02/09    ADMISSION DATE:  05/05/2013    REFERRING MD :  Lindie Spruce PRIMARY SERVICE: Lindie Spruce  CHIEF COMPLAINT:  Dyspnea/ cough  BRIEF PATIENT DESCRIPTION: 70 yowf with severe copd/ 02 dependent admit with refractory AB/ worsening hypoxemic resp failure afer being seen as w/in pm 5/13  SIGNIFICANT EVENTS / STUDIES:    ANTIBIOTICS: Rocephin 5/13 >>  SUBJECTIVE:  Sob at rest, very congested cough  VITAL SIGNS: Wt Readings from Last 3 Encounters:  05/05/13 110 lb 14.3 oz (50.3 kg)  05/05/13 99 lb 9.6 oz (45.178 kg)  05/01/13 105 lb 12.8 oz (47.991 kg)    Temp:  [97.9 F (36.6 C)-98.3 F (36.8 C)] 98.1 F (36.7 C) (05/15 1332) Pulse Rate:  [77-97] 97 (05/15 1332) Resp:  [18-20] 20 (05/15 1332) BP: (113-116)/(48-68) 113/48 mmHg (05/15 1332) SpO2:  [93 %-94 %] 94 % (05/15 1332) 02 rx  4lpm NP   INTAKE / OUTPUT: Intake/Output     05/14 0701 - 05/15 0700 05/15 0701 - 05/16 0700   P.O. 1080 600   Total Intake(mL/kg) 1080 (21.5) 600 (11.9)   Urine (mL/kg/hr) 850 (0.7) 600 (1.2)   Stool 1 (0) 1 (0)   Total Output 851 601   Net +229 -1          PHYSICAL EXAMINATION: General: thin amb wf moderate increase wob at rest  HEENT  No JVD or cervical adenopathy. Mild accessory muscle hypertrophy. Trachea midline, nl thryroid.  Chest was hyperinflated by percussion with diminished breath sounds and moderate increased exp time without wheeze.   Regular rate and rhythm without murmur gallop or rub or increase P2 or edema.   Abd: no hsm, nl excursion. Ext warm without cyanosis or clubbing.    LABS:  Recent Labs Lab 05/05/13 1550  HGB 14.5  WBC 14.1*  PLT 257  NA 138  K 4.4  CL 98  CO2 30  GLUCOSE 148*  BUN 17  CREATININE 0.69  CALCIUM 9.7  AST 20  ALT 15  ALKPHOS 94  BILITOT 0.5  PROT 7.7  ALBUMIN 3.7  PROBNP 168.8   No results found for this basename: GLUCAP,  in the last 168  hours  CT chest 5/14  Neg cavities, bilateral mucus impaction  Sinus CT 5/14 > neg   ASSESSMENT / PLAN:  1) Acute on chronic respiratory failure/ aecopd - sp recent smoking cessation  plan IV steroids, check sputum, nebs, quantiferon assay rx rocephin for now  Titrate 02      2) GOLD III COPD /02 dep at baseline -- PFTs 03/24/2012  FEV1  0.91 (43%) ratio 48 and no better p B2,  DLCO 38% corrects to 53%  3) Extremely poor mucociliary function > retained mucus plugs by ct both Lower lobes - try vest/ flutter    Sandrea Hughs, MD Pulmonary and Critical Care Medicine [facility] Cell [phone] After 5:30 PM or weekends, call [phone]

## 2013-05-07 NOTE — Progress Notes (Signed)
Patient decided she did not want to to do the chest vest. She says the flutter would be the better choice for her.

## 2013-05-08 DIAGNOSIS — J9620 Acute and chronic respiratory failure, unspecified whether with hypoxia or hypercapnia: Secondary | ICD-10-CM | POA: Diagnosis not present

## 2013-05-08 DIAGNOSIS — J441 Chronic obstructive pulmonary disease with (acute) exacerbation: Secondary | ICD-10-CM | POA: Diagnosis not present

## 2013-05-08 LAB — CULTURE, RESPIRATORY W GRAM STAIN

## 2013-05-08 MED ORDER — METHYLPREDNISOLONE SODIUM SUCC 125 MG IJ SOLR
60.0000 mg | Freq: Two times a day (BID) | INTRAMUSCULAR | Status: DC
  Administered 2013-05-08 – 2013-05-09 (×2): 60 mg via INTRAVENOUS
  Filled 2013-05-08 (×3): qty 0.96

## 2013-05-08 NOTE — Progress Notes (Signed)
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  SIGNIFICANT EVENTS / STUDIES/cultures:  Quantiferon gold 5/13: indeterminate  AFB 5/14: no afb>>> Sputum 5/14: rare fungus (felt colonizer)  ANTIBIOTICS: Rocephin(aecopd) 5/13 >>   SUBJECTIVE:  Sob at rest, very congested cough  VITAL SIGNS: Wt Readings from Last 3 Encounters:  05/05/13 50.3 kg (110 lb 14.3 oz)  05/05/13 45.178 kg (99 lb 9.6 oz)  05/01/13 47.991 kg (105 lb 12.8 oz)    Temp:  [97.8 F (36.6 C)-98.1 F (36.7 C)] 97.8 F (36.6 C) (05/16 0530) Pulse Rate:  [67-97] 67 (05/16 0530) Resp:  [20] 20 (05/16 0530) BP: (113-137)/(48-69) 137/69 mmHg (05/16 0530) SpO2:  [94 %-98 %] 95 % (05/16 0854) 02 rx  4lpm NP   INTAKE / OUTPUT: Intake/Output     05/15 0701 - 05/16 0700 05/16 0701 - 05/17 0700   P.O. 840    Total Intake(mL/kg) 840 (16.7)    Urine (mL/kg/hr) 600 (0.5)    Stool 2 (0)    Total Output 602     Net +238          Urine Occurrence 2 x    Stool Occurrence 2 x 3 x     PHYSICAL EXAMINATION: General: thin amb wf moderate increase wob at rest  HEENT  No JVD or cervical adenopathy. Mild accessory muscle hypertrophy. Trachea midline, nl thryroid.  Chest was hyperinflated by percussion with diminished breath sounds and now has exp wheeze   Regular rate and rhythm without murmur gallop or rub or increase P2 or edema.   Abd: no hsm, nl excursion. Ext warm without cyanosis or clubbing.    LABS:  Recent Labs Lab 05/05/13 1550  HGB 14.5  WBC 14.1*  PLT 257  NA 138  K 4.4  CL 98  CO2 30  GLUCOSE 148*  BUN 17  CREATININE 0.69  CALCIUM 9.7  AST 20   ALT 15  ALKPHOS 94  BILITOT 0.5  PROT 7.7  ALBUMIN 3.7  PROBNP 168.8   No results found for this basename: GLUCAP,  in the last 168 hours  CT chest 5/14  Neg cavities, bilateral mucus impaction  Sinus CT 5/14 > neg   ASSESSMENT / PLAN:  1) Acute on chronic respiratory failure/ aecopd - sp recent smoking cessation  plan IV steroids > taper down as tol  nebs rx rocephin for now per dashboard for aecopd Titrate 02     2) GOLD III COPD /02 dep at baseline -- PFTs 03/24/2012  FEV1  0.91 (43%) ratio 48 and no better p B2,  DLCO 38% corrects to 53%  3) Extremely poor mucociliary function > retained mucus plugs by ct both Lower lobes - cont vest/ flutter/mucinex   Sandrea Hughs, MD Pulmonary and Critical Care Medicine  [facility] Cell [phone] After 5:30 PM or weekends, call [phone]

## 2013-05-09 DIAGNOSIS — J9620 Acute and chronic respiratory failure, unspecified whether with hypoxia or hypercapnia: Secondary | ICD-10-CM | POA: Diagnosis not present

## 2013-05-09 DIAGNOSIS — J441 Chronic obstructive pulmonary disease with (acute) exacerbation: Secondary | ICD-10-CM | POA: Diagnosis not present

## 2013-05-09 DIAGNOSIS — F17200 Nicotine dependence, unspecified, uncomplicated: Secondary | ICD-10-CM

## 2013-05-09 MED ORDER — METHYLPREDNISOLONE SODIUM SUCC 40 MG IJ SOLR
40.0000 mg | Freq: Two times a day (BID) | INTRAMUSCULAR | Status: DC
  Administered 2013-05-09 – 2013-05-11 (×4): 40 mg via INTRAVENOUS
  Filled 2013-05-09 (×5): qty 1

## 2013-05-09 NOTE — Evaluation (Signed)
Physical Therapy Evaluation Patient Details Name: [name] MRN: [id number] DOB: 11-18-1932 Today's Date: 05/09/2013 Time: 1205-1215 PT Time Calculation (min): 10 min  PT Assessment / Plan / Recommendation Clinical Impression  Pt is a 79 year old female admitted for acute on chronic respiratory failure/ AECOPD.  Pt would benefit from acute PT services in order to improve ambulation and activity tolerance in preparation for d/c home with spouse.  Pt interested in receiving HHPT upon d/c.    PT Assessment  Patient needs continued PT services    Follow Up Recommendations  Home health PT    Does the patient have the potential to tolerate intense rehabilitation      Barriers to Discharge        Equipment Recommendations  None recommended by PT    Recommendations for Other Services     Frequency Min 3X/week    Precautions / Restrictions Precautions Precautions: None Restrictions Weight Bearing Restrictions: No   Pertinent Vitals/Pain SaO2 97% 2L at rest SaO2 dropped to 77% on 2L upon returning to bed. SaO2 quickly increased to 90% on 2L with rest.      Mobility  Bed Mobility Bed Mobility: Supine to Sit;Sit to Supine Supine to Sit: 7: Independent Sit to Supine: 7: Independent Transfers Transfers: Sit to Stand;Stand to Sit Sit to Stand: 5: Supervision;From bed Stand to Sit: 5: Supervision;To bed Ambulation/Gait Ambulation/Gait Assistance: 4: Min guard Ambulation Distance (Feet): 50 Feet (x2) Assistive device: None Ambulation/Gait Assistance Details: pt pushed O2 tank, standing rest break for breathing after 50 feet Gait Pattern: Step-through pattern General Gait Details: SOB with SaO2 87% on 2L during ambulation then 77% on 2L upon sitting on bed    Exercises     PT Diagnosis: Difficulty walking  PT Problem List: Decreased activity tolerance;Decreased mobility;Cardiopulmonary status limiting activity PT Treatment Interventions: Gait training;DME  instruction;Functional mobility training;Therapeutic activities;Therapeutic exercise;Patient/family education   PT Goals Acute Rehab PT Goals PT Goal Formulation: With patient Time For Goal Achievement: 05/16/13 Potential to Achieve Goals: Good Pt will Ambulate: 51 - 150 feet;with modified independence PT Goal: Ambulate - Progress: Goal set today Pt will Perform Home Exercise Program: with supervision, verbal cues required/provided PT Goal: Perform Home Exercise Program - Progress: Goal set today Additional Goals Additional Goal #1: Pt will ambulate and perform exercises with SaO2 90% or greater. PT Goal: Additional Goal #1 - Progress: Goal set today  Visit Information  Last PT Received On: 05/09/13 Assistance Needed: +1    Subjective Data  Subjective: I just got a chair to put in my shower.   Prior Functioning  Home Living Lives With: Spouse Type of Home: House Home Adaptive Equipment: Shower chair with back Prior Function Level of Independence: Independent Comments: chronic 2L O2  Communication Communication: No difficulties    Cognition  Cognition Arousal/Alertness: Awake/alert Behavior During Therapy: WFL for tasks assessed/performed Overall Cognitive Status: Within Functional Limits for tasks assessed    Extremity/Trunk Assessment Right Upper Extremity Assessment RUE ROM/Strength/Tone: [facility] for tasks assessed Left Upper Extremity Assessment LUE ROM/Strength/Tone: WFL for tasks assessed Right Lower Extremity Assessment RLE ROM/Strength/Tone: [facility] for tasks assessed Left Lower Extremity Assessment LLE ROM/Strength/Tone: WFL for tasks assessed   Balance    End of Session PT - End of Session Equipment Utilized During Treatment: Oxygen Activity Tolerance: Patient limited by fatigue Patient left: in bed;with call bell/phone within reach  GP     Dianne Whelchel,KATHrine E 05/09/2013, 12:27 PM Zenovia Jarred, PT, DPT 05/09/2013 Pager: [phone]

## 2013-05-09 NOTE — Progress Notes (Signed)
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  SIGNIFICANT EVENTS / STUDIES/cultures:  Quantiferon gold 5/13: indeterminate  AFB 5/14: no afb>>> Sputum 5/14: rare fungus (felt colonizer)  ANTIBIOTICS: Rocephin(aecopd) 5/13 >>   SUBJECTIVE:  Sob at rest, very congested cough  VITAL SIGNS: Wt Readings from Last 3 Encounters:  05/05/13 50.3 kg (110 lb 14.3 oz)  05/05/13 45.178 kg (99 lb 9.6 oz)  05/01/13 47.991 kg (105 lb 12.8 oz)    Temp:  [97.3 F (36.3 C)-97.9 F (36.6 C)] 97.3 F (36.3 C) (05/17 0500) Pulse Rate:  [74-82] 82 (05/17 0500) Resp:  [16-20] 16 (05/17 0500) BP: (125-150)/(64-75) 150/75 mmHg (05/17 0500) SpO2:  [91 %-98 %] 91 % (05/17 0819) 02 rx  4lpm NP   INTAKE / OUTPUT: Intake/Output     05/16 0701 - 05/17 0700 05/17 0701 - 05/18 0700   P.O. 840    Total Intake(mL/kg) 840 (16.7)    Urine (mL/kg/hr) 200 (0.2)    Stool     Total Output 200     Net +640          Urine Occurrence 2 x    Stool Occurrence 5 x      PHYSICAL EXAMINATION: General: thin amb wf moderate increase wob at rest  HEENT  No JVD or cervical adenopathy. Mild accessory muscle hypertrophy. Trachea midline, nl thryroid.  Chest was hyperinflated by percussion with diminished breath sounds and now has exp wheeze   Regular rate and rhythm without murmur gallop or rub or increase P2 or edema.   Abd: no hsm, nl excursion. Ext warm without cyanosis or clubbing.    LABS:  Recent Labs Lab 05/05/13 1550  HGB 14.5  WBC 14.1*  PLT 257  NA 138  K 4.4  CL 98  CO2 30  GLUCOSE 148*  BUN 17  CREATININE 0.69  CALCIUM 9.7  AST 20  ALT 15   ALKPHOS 94  BILITOT 0.5  PROT 7.7  ALBUMIN 3.7  PROBNP 168.8   No results found for this basename: GLUCAP,  in the last 168 hours  CT chest 5/14  Neg cavities, bilateral mucus impaction  Sinus CT 5/14 > neg   ASSESSMENT / PLAN:  1) Acute on chronic respiratory failure/ aecopd - sp recent smoking cessation  plan IV steroids > taper down to 40mg Bid Nebs rx rocephin for now per dashboard for aecopd Titrate 02     2) GOLD III COPD /02 dep at baseline -- PFTs 03/24/2012  FEV1  0.91 (43%) ratio 48 and no better p B2,  DLCO 38% corrects to 53%   3) Extremely poor mucociliary function > retained mucus plugs by ct both Lower lobes - cont vest/ flutter/mucinex   Hasten Sweitzer M. Kriste Basque, MD Pulmonary and Critical Care Medicine [facility]

## 2013-05-10 ENCOUNTER — Inpatient Hospital Stay (HOSPITAL_COMMUNITY): Payer: Medicare Other

## 2013-05-10 DIAGNOSIS — J9620 Acute and chronic respiratory failure, unspecified whether with hypoxia or hypercapnia: Secondary | ICD-10-CM | POA: Diagnosis not present

## 2013-05-10 DIAGNOSIS — F17200 Nicotine dependence, unspecified, uncomplicated: Secondary | ICD-10-CM | POA: Diagnosis not present

## 2013-05-10 DIAGNOSIS — R0989 Other specified symptoms and signs involving the circulatory and respiratory systems: Secondary | ICD-10-CM | POA: Diagnosis not present

## 2013-05-10 DIAGNOSIS — R0602 Shortness of breath: Secondary | ICD-10-CM | POA: Diagnosis not present

## 2013-05-10 DIAGNOSIS — J441 Chronic obstructive pulmonary disease with (acute) exacerbation: Secondary | ICD-10-CM | POA: Diagnosis not present

## 2013-05-10 LAB — COMPREHENSIVE METABOLIC PANEL
ALT: 21 U/L (ref 0–35)
AST: 20 U/L (ref 0–37)
Albumin: 3 g/dL — ABNORMAL LOW (ref 3.5–5.2)
Alkaline Phosphatase: 71 U/L (ref 39–117)
Calcium: 9.3 mg/dL (ref 8.4–10.5)
GFR calc Af Amer: >90 mL/min (ref >90)
Potassium: 4.4 mEq/L (ref 3.5–5.1)
Sodium: 139 mEq/L (ref 135–145)
Total Protein: 6.9 g/dL (ref 6.0–8.3)

## 2013-05-10 LAB — CBC WITH DIFFERENTIAL/PLATELET
Basophils Relative: 0 % (ref 0–1)
Eosinophils Relative: 0 % (ref 0–5)
HCT: 44.5 % (ref 36.0–46.0)
Hemoglobin: 14.2 g/dL (ref 12.0–15.0)
MCHC: 31.9 g/dL (ref 30.0–36.0)
MCV: 92.5 fL (ref 78.0–100.0)
Monocytes Absolute: 0.5 10*3/uL (ref 0.1–1.0)
Monocytes Relative: 4 % (ref 3–12)
Neutro Abs: 11.4 10*3/uL — ABNORMAL HIGH (ref 1.7–7.7)

## 2013-05-10 MED ORDER — ALIGN 4 MG PO CAPS
1.0000 | ORAL_CAPSULE | Freq: Every day | ORAL | Status: DC
  Administered 2013-05-10 – 2013-05-11 (×2): 1 via ORAL
  Filled 2013-05-10 (×2): qty 1

## 2013-05-10 MED ORDER — CLONAZEPAM 0.5 MG PO TABS
0.2500 mg | ORAL_TABLET | Freq: Two times a day (BID) | ORAL | Status: DC
  Administered 2013-05-10 (×2): 0.25 mg via ORAL
  Filled 2013-05-10 (×3): qty 1

## 2013-05-10 NOTE — Progress Notes (Signed)
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  ANTIBIOTICS: Rocephin(aecopd) 5/13 >>   SUBJECTIVE:  Sob at rest, very congested cough  VITAL SIGNS: Wt Readings from Last 3 Encounters:  05/05/13 50.3 kg (110 lb 14.3 oz)  05/05/13 45.178 kg (99 lb 9.6 oz)  05/01/13 47.991 kg (105 lb 12.8 oz)    Temp:  [97.5 F (36.4 C)-98.1 F (36.7 C)] 97.5 F (36.4 C) (05/18 0500) Pulse Rate:  [76-80] 76 (05/18 0500) Resp:  [18-20] 20 (05/18 0500) BP: (128-147)/(75-84) 147/82 mmHg (05/18 0500) SpO2:  [92 %-98 %] 98 % (05/18 0843) 02 rx  4lpm NP   INTAKE / OUTPUT: Intake/Output     05/17 0701 - 05/18 0700 05/18 0701 - 05/19 0700   P.O.  300   Total Intake(mL/kg)  300 (6)   Urine (mL/kg/hr)     Total Output       Net   +300        Urine Occurrence 3 x 2 x   Stool Occurrence 1 x 2 x     PHYSICAL EXAMINATION: General: thin amb wf moderate increase wob at rest  HEENT  No JVD or cervical adenopathy. Mild accessory muscle hypertrophy. Trachea midline, nl thryroid.  Chest was hyperinflated by percussion with diminished breath sounds and now has exp wheeze   Regular rate and rhythm without murmur gallop or rub or increase P2 or edema.   Abd: no hsm, nl excursion. Ext warm without cyanosis or clubbing.    Marland Kitchen ALIGN  1 capsule Oral Daily  . arformoterol  15 mcg Nebulization BID  . budesonide   0.5 mg Nebulization BID  . cefTRIAXone (ROCEPHIN)  IV  2 g Intravenous Q24H  . famotidine  20 mg Oral BID  . guaiFENesin  1,200 mg Oral BID  . heparin subcutaneous  5,000 Units Subcutaneous Q8H  . methylPREDNISolone (SOLU-MEDROL) injection  40 mg Intravenous Q12H  . tiotropium  18 mcg Inhalation Daily   Allergies  Allergen Reactions  . Levofloxacin Other (See Comments)    Lowered blood pressre     LABS:  Recent Labs Lab 05/05/13 1550  HGB 14.5  WBC 14.1*  PLT 257  NA 138  K 4.4  CL 98  CO2 30  GLUCOSE 148*  BUN 17  CREATININE 0.69  CALCIUM 9.7  AST 20  ALT 15  ALKPHOS 94  BILITOT 0.5  PROT 7.7  ALBUMIN 3.7  PROBNP 168.8   No results found for this basename: GLUCAP,  in the last 168 hours  CXR 5/13> IMPRESSION:  Findings suspicious for a new cavitary lesion in the left upper lobe. Irregular linear density at the left base is also new.  Further evaluation for both of these lesions with chest CT with contrast would be recommended.   CT chest 5/14>  IMPRESSION:  No evidence for a pulmonary embolism.  Filling defects in the  lower lobe bronchi bilaterally. Findings are most compatible with mucous plugging or aspiration. There are patchy densities in both lower lobes. Findings could be related to pneumonia or aspiration. However, a neoplastic process cannot be excluded, particularly in the right lung. Recommend short-term follow-up to ensure resolution of these parenchymal densities.  Centrilobular emphysema.  Enlarged pulmonary arteries suggest pulmonary hypertension.  Sinus CT 5/14 > neg   ASSESSMENT / PLAN:  1) Acute on chronic respiratory failure/ aecopd - sp recent smoking cessation  plan - on Solumed 40mg IVBid, Roceph2gm, NEBS w/ Brov&BudesBid + Xopenex prn, Spiriva, Mucinex1200Bid Titrate 02, continue flutter etc... Plan:  F/u CXR, Labs;  Add low dose Klonopin to see if it helps    2) GOLD III COPD /02 dep at baseline -- PFTs 03/24/2012  FEV1  0.91  (43%) ratio 48 and no better p B2,  DLCO 38% corrects to 53%   3) Extremely poor mucociliary function > retained mucus plugs by ct both Lower lobes - cont vest/ flutter/mucinex   Aleric Froelich M. Kriste Basque, MD Pulmonary and Critical Care Medicine [facility] 05/10/13 @ 12:50pm

## 2013-05-10 NOTE — Progress Notes (Signed)
Patient's daughter requesting bed side commode, nebulizer machine, and oxygen caddy for discharge.  Philomena Doheny RN

## 2013-05-11 ENCOUNTER — Telehealth: Payer: Self-pay | Admitting: Internal Medicine

## 2013-05-11 DIAGNOSIS — J441 Chronic obstructive pulmonary disease with (acute) exacerbation: Secondary | ICD-10-CM | POA: Diagnosis not present

## 2013-05-11 DIAGNOSIS — J9620 Acute and chronic respiratory failure, unspecified whether with hypoxia or hypercapnia: Secondary | ICD-10-CM | POA: Diagnosis not present

## 2013-05-11 MED ORDER — BUDESONIDE 0.5 MG/2ML IN SUSP: 0.5000 mg | Freq: Two times a day (BID) | RESPIRATORY_TRACT | Status: DC

## 2013-05-11 MED ORDER — PREDNISONE 10 MG PO TABS: ORAL_TABLET | ORAL | Status: DC

## 2013-05-11 MED ORDER — BUDESONIDE 0.25 MG/2ML IN SUSP: 0.2500 mg | Freq: Four times a day (QID) | RESPIRATORY_TRACT | Status: DC

## 2013-05-11 MED ORDER — CLONAZEPAM 0.5 MG PO TABS: 0.2500 mg | ORAL_TABLET | Freq: Every evening | ORAL | Status: DC | PRN

## 2013-05-11 MED ORDER — ARFORMOTEROL TARTRATE 15 MCG/2ML IN NEBU: 15.0000 ug | INHALATION_SOLUTION | Freq: Two times a day (BID) | RESPIRATORY_TRACT | Status: DC

## 2013-05-11 MED ORDER — AMOXICILLIN-POT CLAVULANATE 875-125 MG PO TABS: 1.0000 | ORAL_TABLET | Freq: Two times a day (BID) | ORAL | Status: DC

## 2013-05-11 MED ORDER — FORMOTEROL FUMARATE 20 MCG/2ML IN NEBU: 20.0000 ug | INHALATION_SOLUTION | Freq: Two times a day (BID) | RESPIRATORY_TRACT | Status: DC

## 2013-05-11 MED ORDER — FAMOTIDINE 20 MG PO TABS: 20.0000 mg | ORAL_TABLET | Freq: Two times a day (BID) | ORAL | Status: DC

## 2013-05-11 NOTE — Progress Notes (Signed)
Physical Therapy Treatment Patient Details Name: [name] MRN: [id number] DOB: 07/28/1932 Today's Date: 05/11/2013 Time: 9528-4132 PT Time Calculation (min): 24 min  PT Assessment / Plan / Recommendation Comments on Treatment Session  Pt ambulated in hallway on 3L O2 with SaO2 92% and also performed exercises.  Pt given handout on HEP as well as provided handout for flutter device (did not review as pt states she knows how to use).  Pt reports possible d/c home today.    Follow Up Recommendations  Home health PT     Does the patient have the potential to tolerate intense rehabilitation     Barriers to Discharge        Equipment Recommendations  None recommended by PT    Recommendations for Other Services    Frequency     Plan Discharge plan remains appropriate;Frequency remains appropriate    Precautions / Restrictions Precautions Precautions: None   Pertinent Vitals/Pain See below    Mobility  Bed Mobility Bed Mobility: Supine to Sit;Sit to Supine Supine to Sit: 7: Independent Sit to Supine: 7: Independent Transfers Transfers: Sit to Stand;Stand to Sit Sit to Stand: 5: Supervision;From bed Stand to Sit: 5: Supervision;To bed Ambulation/Gait Ambulation/Gait Assistance: 5: Supervision Ambulation Distance (Feet): 50 Feet Assistive device: None Ambulation/Gait Assistance Details: pt pushed O2 tank, no rest break required Gait Pattern: Step-through pattern General Gait Details: SOB with SaO2 92% on 3L during ambulation then 90% on 2.5L upon sitting on bed    Exercises General Exercises - Lower Extremity Ankle Circles/Pumps: AROM;Both;20 reps;Seated Long Arc Quad: AROM;Seated;Both;10 reps Heel Slides:  (SaO2 86-92% on 2.5 L during exercises) Hip ABduction/ADduction: AROM;Both;10 reps;Supine Straight Leg Raises: AROM;Both;10 reps;Supine Hip Flexion/Marching: AROM;Seated;Both;10 reps   PT Diagnosis:    PT Problem List:   PT Treatment Interventions:     PT  Goals Acute Rehab PT Goals PT Goal: Ambulate - Progress: Progressing toward goal PT Goal: Perform Home Exercise Program - Progress: Progressing toward goal Additional Goals PT Goal: Additional Goal #1 - Progress: Progressing toward goal  Visit Information  Last PT Received On: 05/11/13 Assistance Needed: +1    Subjective Data  Subjective: Do you have this written down? I'll probably forget. (given HEP)   Cognition  Cognition Arousal/Alertness: Awake/alert Behavior During Therapy: WFL for tasks assessed/performed Overall Cognitive Status: Within Functional Limits for tasks assessed    Balance     End of Session PT - End of Session Equipment Utilized During Treatment: Oxygen Activity Tolerance: Patient limited by fatigue Patient left: in bed;with call bell/phone within reach   GP     Patsey Pitstick,KATHrine E 05/11/2013, 10:40 AM Zenovia Jarred, PT, DPT 05/11/2013 Pager: [phone]

## 2013-05-11 NOTE — Discharge Summary (Signed)
Physician Discharge Summary     Patient ID: [name] MRN: [id number] DOB/AGE: 1932/04/21 79 y.o.  Admit date: 05/05/2013 Discharge date: 05/11/2013  Admission Diagnoses: Dyspnea and cough  Discharge Diagnoses:  Active Problems:   COPD  GOLD III   Abnormal CXR   Acute-on-chronic respiratory failure   Significant Hospital tests/ studies/ interventions and procedures   ANTIBIOTICS:  rocephin 5/13 >>>5/19 Augmentin 5/19>>>   Hospital Course:   Acute on chronic respiratory failure/ aecopd  GOLD III COPD?/02 dep at baseline sp smoking cessation 02/2012   BRIEF PATIENT DESCRIPTION:  80 yowf with severe copd/ 02 dependent admit with refractory AB/ worsening hypoxemic resp failure afer being seen as w/in pm 5/13   -- PFTs 03/24/2012 FEV1 0.91 (43%) ratio 48 and no better p B2, DLCO 38% corrects to 53%  Hospital course  This is a 79 year old female who was admitted for refractory cough and dyspnea. On CXR she had what looked like a new cavitary lesion in the LUL. She had sputum for AFBs which were negative, her Quantiferon gold was negative.  She had a CT scan to better define the CXR findings which showed mucous plugging rather than new cavitary changes. Treatment was then aimed at continuing antibiotics for AECOPD, pulmonary hygiene and supportive care. She has improved but is not yet at baseline at time of discharge. From a pulmonary standpoint her plan of care will be as follows: Plan Oxygen-->2 liters at rest; increase as needed to sat > 90%  Slow pred taper Changed symbicort to brovana and budesonide.  Resume spiriva  F/u with [place] in our office  Discharge Exam: BP 129/72  Pulse 67  Temp(Src) 97.3 F (36.3 C) (Oral)  Resp 24  Ht 5\' 7"  (1.702 m)  Wt 50.3 kg (110 lb 14.3 oz)  BMI 17.36 kg/m2  SpO2 92% 2 liters  PHYSICAL EXAMINATION:  General: thin amb wf moderate increase wob at rest  HEENT mild turbinate edema. Oropharynx no thrush or excess pnd or  cobblestoning. No JVD or cervical adenopathy. Mild accessory muscle hypertrophy. Trachea midline, nl thryroid. Chest was hyperinflated by percussion with diminished breath sounds and moderate increased exp time without wheeze. Hoover sign positive at mid inspiration. Regular rate and rhythm without murmur gallop or rub or increase P2 or edema. Abd: no hsm, nl excursion. Ext warm without cyanosis or clubbing.    Labs at discharge Lab Results  Component Value Date   CREATININE 0.70 05/10/2013   BUN 23 05/10/2013   NA 139 05/10/2013   K 4.4 05/10/2013   CL 99 05/10/2013   CO2 32 05/10/2013   Lab Results  Component Value Date   WBC 12.5* 05/10/2013   HGB 14.2 05/10/2013   HCT 44.5 05/10/2013   MCV 92.5 05/10/2013   PLT 249 05/10/2013   Lab Results  Component Value Date   ALT 21 05/10/2013   AST 20 05/10/2013   ALKPHOS 71 05/10/2013   BILITOT 0.2* 05/10/2013   No results found for this basename: INR,  PROTIME    Current radiology studies Dg Chest 2 View  05/10/2013   *RADIOLOGY REPORT*  Clinical Data: Dyspnea.  Hypoxia.  Shortness of breath.  CHEST - 2 VIEW  Comparison: 05/05/2013 and CT 05/06/2013  Findings: Hyperinflation. Normal heart size.  Aortic atherosclerosis. No pleural effusion or pneumothorax.  Slight increase in left infrahilar airspace disease, likely representing infection.  The apparent spiculated opacity in the superior segment right lower lobe likely corresponds to vague increased  density in the right midlung on the frontal.  No new airspace disease identified.  IMPRESSION: COPD/chronic bronchitis.  Mild increase in left infrahilar airspace disease/infection.  A super segment right lower lobe spiculated opacity warrants CT follow-up, as detailed on prior report.   Original Report Authenticated By: Jeronimo Greaves, M.D.    Disposition:  01-Home or Self Care      Discharge Orders   Future Appointments Provider Department Dept Phone   05/15/2013 1:30 PM Nyoka Cowden, MD [facility] [phone]   06/01/2013 10:30 AM Nyoka Cowden, MD [facility] [phone]   Future Orders Complete By Expires     Call MD for:  difficulty breathing, headache or visual disturbances  As directed     Call MD for:  temperature >100.4  As directed     Diet - low sodium heart healthy  As directed     Increase activity slowly  As directed         Medication List    STOP taking these medications       budesonide-formoterol 160-4.5 MCG/ACT inhaler  Commonly known as:  SYMBICORT      TAKE these medications       albuterol 108 (90 BASE) MCG/ACT inhaler  Commonly known as:  PROVENTIL HFA;VENTOLIN HFA  Inhale 2 puffs into the lungs every 4 (four) hours as needed.     albuterol (2.5 MG/3ML) 0.083% nebulizer solution  Commonly known as:  PROVENTIL  Take 2.5 mg by nebulization every 6 (six) hours as needed.     amoxicillin-clavulanate 875-125 MG per tablet  Commonly known as:  AUGMENTIN  Take 1 tablet by mouth 2 (two) times daily.     budesonide 0.25 MG/2ML nebulizer solution  Commonly known as:  PULMICORT  Take 2 mLs (0.25 mg total) by nebulization 4 (four) times daily.     calcium-vitamin D 250-125 MG-UNIT per tablet  Commonly known as:  OSCAL WITH D  Take 1 tablet by mouth daily.     clonazePAM 0.5 MG tablet  Commonly known as:  KLONOPIN  Take 0.5 tablets (0.25 mg total) by mouth at bedtime as needed for anxiety.     dextromethorphan-guaiFENesin 30-600 MG per 12 hr tablet  Commonly known as:  MUCINEX DM  Take 1 tablet by mouth every 12 (twelve) hours as needed.     famotidine 20 MG tablet  Commonly known as:  PEPCID  Take 1 tablet (20 mg total) by mouth 2 (two) times daily.     formoterol 20 MCG/2ML nebulizer solution  Commonly known as:  PERFOROMIST  Take 2 mLs (20 mcg total) by nebulization 2 (two) times daily.     predniSONE 10 MG tablet  Commonly known as:  DELTASONE  Take 4 tabs  daily with food x 4 days, then 3 tabs daily x 4 days,  then 2 tabs daily x 4 days, then 1 tab daily x4 days then stop. #40     tiotropium 18 MCG inhalation capsule  Commonly known as:  SPIRIVA  Place 18 mcg into inhaler and inhale daily.       Follow-up Information   Follow up with Sandrea Hughs, MD On 05/15/2013. (130pm)    Contact information:   [address] [place] [address] [phone]       Discharged Condition: good  Physician Statement:   The Patient was personally examined, the discharge assessment and plan has been personally reviewed and I agree with ACNP Babcock's assessment and  plan. > 30 minutes of time have been dedicated to discharge assessment, planning and discharge instructions.    Sandrea Hughs, MD Pulmonary and Critical Care Medicine [facility] Cell [phone] After 5:30 PM or weekends, call [phone]

## 2013-05-11 NOTE — Progress Notes (Signed)
Spoke with pt concerning Home Health needs and Agencies. Pt states she is active with [facility] for DME, will use them for COPD program/Home Health needs.  Referral given to in house rep with [facility].

## 2013-05-12 DIAGNOSIS — J438 Other emphysema: Secondary | ICD-10-CM | POA: Diagnosis not present

## 2013-05-12 DIAGNOSIS — J9620 Acute and chronic respiratory failure, unspecified whether with hypoxia or hypercapnia: Secondary | ICD-10-CM | POA: Diagnosis not present

## 2013-05-12 DIAGNOSIS — Z9981 Dependence on supplemental oxygen: Secondary | ICD-10-CM | POA: Diagnosis not present

## 2013-05-12 DIAGNOSIS — F17200 Nicotine dependence, unspecified, uncomplicated: Secondary | ICD-10-CM | POA: Diagnosis not present

## 2013-05-12 DIAGNOSIS — J45901 Unspecified asthma with (acute) exacerbation: Secondary | ICD-10-CM | POA: Diagnosis not present

## 2013-05-12 DIAGNOSIS — J441 Chronic obstructive pulmonary disease with (acute) exacerbation: Secondary | ICD-10-CM | POA: Diagnosis not present

## 2013-05-12 DIAGNOSIS — I2789 Other specified pulmonary heart diseases: Secondary | ICD-10-CM | POA: Diagnosis not present

## 2013-05-12 MED ORDER — FORMOTEROL FUMARATE 20 MCG/2ML IN NEBU: 20.0000 ug | INHALATION_SOLUTION | Freq: Two times a day (BID) | RESPIRATORY_TRACT | Status: DC

## 2013-05-12 MED ORDER — BUDESONIDE 0.25 MG/2ML IN SUSP: 0.2500 mg | Freq: Four times a day (QID) | RESPIRATORY_TRACT | Status: DC

## 2013-05-12 NOTE — Telephone Encounter (Signed)
Yes send it under my name asap

## 2013-05-12 NOTE — Telephone Encounter (Signed)
Melissa is awre and will have MW sign RX's and fax them over. This has been placed in his look at for signature.

## 2013-05-12 NOTE — Telephone Encounter (Signed)
I spoke with [name]. She stated [name] from the hospital wrote orders for pulmicort and performists. Bc he is not PECOS certified through medicare they can not fill these orders under his name. They are wanting to know if [name] is wanting pt to be on this and if so if we can send this under [name] name. Please advise thanks

## 2013-05-12 NOTE — Telephone Encounter (Signed)
done

## 2013-05-13 ENCOUNTER — Telehealth: Payer: Self-pay | Admitting: Internal Medicine

## 2013-05-13 DIAGNOSIS — J449 Chronic obstructive pulmonary disease, unspecified: Secondary | ICD-10-CM

## 2013-05-13 NOTE — Telephone Encounter (Signed)
Per [employer] staff message: Message    Hey Mindy. Home health was ordered for this pt at her hospital d/c. Our start of care nurse saw her yesterday and has recommended OT and PT.    Thank you   Dr. Sherene Sires is it okay if we order OT/PT. thanks

## 2013-05-13 NOTE — Telephone Encounter (Signed)
Dr. Sherene Sires are you okay if we order this? Please advise thanks

## 2013-05-13 NOTE — Telephone Encounter (Signed)
Spoke with Melissa and notified okay per MW for PT and OT Nothing further needed

## 2013-05-13 NOTE — Telephone Encounter (Signed)
Should have been ordered through the hospital case manager so if it wasn't will need [facility] to do a home health evaluation to assess her needs - ask Almyra Free to order this

## 2013-05-13 NOTE — Telephone Encounter (Signed)
I have placed home health order--[facility] pt NA wcb

## 2013-05-14 ENCOUNTER — Telehealth: Payer: Self-pay | Admitting: Internal Medicine

## 2013-05-14 DIAGNOSIS — J9620 Acute and chronic respiratory failure, unspecified whether with hypoxia or hypercapnia: Secondary | ICD-10-CM | POA: Diagnosis not present

## 2013-05-14 DIAGNOSIS — Z9981 Dependence on supplemental oxygen: Secondary | ICD-10-CM | POA: Diagnosis not present

## 2013-05-14 DIAGNOSIS — I2789 Other specified pulmonary heart diseases: Secondary | ICD-10-CM | POA: Diagnosis not present

## 2013-05-14 DIAGNOSIS — J438 Other emphysema: Secondary | ICD-10-CM | POA: Diagnosis not present

## 2013-05-14 DIAGNOSIS — J441 Chronic obstructive pulmonary disease with (acute) exacerbation: Secondary | ICD-10-CM | POA: Diagnosis not present

## 2013-05-14 DIAGNOSIS — F17200 Nicotine dependence, unspecified, uncomplicated: Secondary | ICD-10-CM | POA: Diagnosis not present

## 2013-05-14 NOTE — Telephone Encounter (Signed)
[facility] will be sending order over for the pt to get PT/OT.  Will forward to leslie to keep an eye out for this order.

## 2013-05-14 NOTE — Telephone Encounter (Signed)
Noted  

## 2013-05-15 ENCOUNTER — Ambulatory Visit (INDEPENDENT_AMBULATORY_CARE_PROVIDER_SITE_OTHER): Payer: Medicare Other | Admitting: Internal Medicine

## 2013-05-15 ENCOUNTER — Encounter: Payer: Self-pay | Admitting: Internal Medicine

## 2013-05-15 ENCOUNTER — Ambulatory Visit (INDEPENDENT_AMBULATORY_CARE_PROVIDER_SITE_OTHER)
Admission: RE | Admit: 2013-05-15 | Discharge: 2013-05-15 | Disposition: A | Discharge status: Home / Self Care | Payer: Medicare Other | Source: Ambulatory Visit | Attending: Internal Medicine | Admitting: Internal Medicine

## 2013-05-15 VITALS — BP 102/60 | HR 72 | Temp 97.8°F | Ht 67.0 in | Wt 104.0 lb

## 2013-05-15 DIAGNOSIS — J984 Other disorders of lung: Secondary | ICD-10-CM | POA: Diagnosis not present

## 2013-05-15 DIAGNOSIS — J449 Chronic obstructive pulmonary disease, unspecified: Secondary | ICD-10-CM

## 2013-05-15 DIAGNOSIS — J9610 Chronic respiratory failure, unspecified whether with hypoxia or hypercapnia: Secondary | ICD-10-CM

## 2013-05-15 NOTE — Progress Notes (Signed)
Subjective:    Patient ID: [name]    DOB: Jul 01, 1932   MRN: [id number]  HPI  32 yowf active smoker referred 02/08/2012 by [name] for intermittent sob was on 02 but stopped in early 2013    02/08/2012 1st pulmonary ov/ cc "intermittently" bad breathing x 2-3 y  but on  best day can do  [employer] full [place]is[place]  but not mall with episodes much worse last starting 3 weeks prior to OV  rx already by abx and prednisone and some better.  Assoc with congested cough slt discolored better p abx but really has year round am cough x years, worse in winter rec Moderate copd/ emphysema (not severe) I reviewed the Flethcher curve    Work on inhaler technique:  Late add:  Needs cxr on return  03/24/2012 f/u ov/[name] cc doe not changed only  using albuterol but 4 x weekly, minimal am cough, mucoid. No active sinus or hb symptoms rec pt wants alternative to spiriva...feet and ankles swelling since beginning treatment 03/24/12 Pt stopped using 05/06/12   05/09/2012 f/u ov/[name] cc leg swelling p first box of spiriva but did feel it helped breathing, swelling is worse on L sltly, better since held the spiriva. 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 No cough, overt sinus or reflux symptoms. rec Try tudorza one twice daily - if you don't see the green turn red it didn't work  Please see patient coordinator before you leave today  to schedule overnight oximetry on room air Please schedule a follow up office visit in 4 weeks, sooner if needed   06/12/2012 f/u ov/[name] still smoking cc sleeping ok on 02 no am wakes up feeling rested no ha.  [place]oe x > [employer], usually does not use HC parking. No variability or overt sinus or hb symptoms or need for daytime saba.  Not interested in daytime 02 or committed to stop smoking at this point. Likes spiriva > tudorza because not able to trigger the tudorza consistently. rec The key is to stop smoking completely before smoking completely stops you - no  change in meds    08/11/2012 f/u ov/[name] cc missed appt  09/24/2012 f/u ov/[name] still smoking cc doe x ok at [employer] pushing cart rec Work on inhaler technique/ f/u prn   01/05/2013 f/u ov/[name] cc no change in breathing although had aecopd and was provided with nebulizer but no clearly what she's using in it >  back to baseline doe ([employer] shopping)and using hfa twice daily and neb once daily. Has congested cough, still smoking rec Change 02 regimen:  2lpm baseline and walking outside of home  Plan A  Continue Spriva one capsule daily each am and stop smoking Only use your albuterol (plan B proaire, plan C is the nebulizer)  as a rescue medication to be used if you can't catch your breath by resting or doing a relaxed purse lip breathing pattern. The less you use it, the better it will work when you need it. Ok to use up to every 4 hours. For cough/ congestion >  mucinex 600 mg 1-2 every 12 hours as needed Call back with name of neb   05/01/2013 f/u ov/[name] re copd/ most 2lpm 20 h/d  always wears sleeping no longer smoking 60 days Chief Complaint  Patient presents with  . Follow-up    Breathing worse x 1 wk. Out of breath walking from parking lot to our building this morning. Sats 79%  on ra- she has her portable o2 but not sure how to use it. Sats increased to 92%2lpm..   started prednisone 3 days prior to OV  Per S Tysinger, not really better yet, not using spiriva correctly, confused with action plan and still doesn't know names of meds/ different types of albuterol. No change in chronic cough with this flare and really not doing much better since quit smoking  rec Wear 02  2lpm 24 hours per day Start symbicort 160 Take 2 puffs first thing in am and then another 2 puffs about 12 hours later.  Continue spiriva one capsule immediately after your am dose of symbicort Work on inhaler technique.  Admit date: 05/05/2013  Discharge date: 05/11/2013  Admission Diagnoses:  Dyspnea and cough   Discharge Diagnoses:  Active Problems:  COPD GOLD III  Abnormal CXR  Acute-on-chronic respiratory failure    Admitted for refractory cough and dyspnea. On CXR she had what looked like a new cavitary lesion in the LUL. She had sputum for AFBs which were negative, her Quantiferon gold was negative. She had a CT scan to better define the CXR findings which showed mucous plugging rather than new cavitary changes. Treatment was then aimed at continuing antibiotics for AECOPD, pulmonary hygiene and supportive care. She has improved but is not yet at baseline at time of discharge. From a pulmonary standpoint her plan of care will be as follows:  Plan  Oxygen-->2 liters at rest; increase as needed to sat > 90%  Slow pred taper  Changed symbicort to brovana and budesonide.  Resume spiriva  F/u with [place] in our office       05/15/2013 post hosp f/u / [name] on 2lpm/ tapering prednisone off Chief Complaint  Patient presents with  . HFU    Pt states that her breathing is much improved, almost back at baseline.    maint on performist/pulmocort/spiriva still very sedentary.     No obvious daytime variabilty or assoc  cp or chest tightness, subjective wheeze overt sinus or hb symptoms. No unusual exp hx .   Sleeping ok on 2lpm without nocturnal  or early am exacerbation  of respiratory  c/o's or need for noct saba. Also denies any obvious fluctuation of symptoms with weather or environmental changes or other aggravating or alleviating factors except as outlined above.     Current Medications, Allergies, Past Medical History, Past Surgical History, Family History, and Social History were reviewed in [employer] record.  ROS  The following are not active complaints unless bolded sore throat, dysphagia, dental problems, itching, sneezing,  nasal congestion or excess/ purulent secretions, ear ache,   fever, chills, sweats, unintended wt loss, pleuritic or exertional cp,  hemoptysis,  orthopnea pnd or leg swelling, presyncope, palpitations, heartburn, abdominal pain, anorexia, nausea, vomiting, diarrhea  or change in bowel or urinary habits, change in stools or urine, dysuria,hematuria,  rash, arthralgias, visual complaints, headache, numbness weakness or ataxia or problems with walking or coordination,  change in mood/affect or memory.              Objective:   Physical Exam   Thin amb wf 97 02/08/2012 > 03/24/2012  99 > 103 05/09/2012 > 06/12/2012  99 >   09/24/2012  96 > 98 01/05/2013 >  106 05/01/2013 > 05/05/2013  99 > 05/15/2013  104  Congested rattling cough much better  HEENT mild turbinate edema.  Oropharynx no thrush or excess pnd or cobblestoning.  No JVD or  cervical adenopathy. Mild accessory muscle hypertrophy. Trachea midline, nl thryroid. Chest was hyperinflated by percussion with diminished breath sounds and moderate increased exp time without wheeze. Hoover sign positive at mid inspiration. Regular rate and rhythm without murmur gallop or rub or increase P2 edema resolved.  Abd: no hsm, nl excursion. Ext warm without cyanosis or clubbing.    CXR  05/15/2013 :   Streaky densities at both lung bases. Previously seen small areas of patchy bilateral lower lobe air space disease is not as well seen but can be obscured on routine chest radiography         Assessment & Plan:  st.

## 2013-05-15 NOTE — Assessment & Plan Note (Addendum)
-   ono RA 05/14/12   4 h 33 min < 89%   So ordered 02 2lpm    - HCO3  28  05/27/12   - 01/05/2013   Walked RA x one lap @ 185 stopped due to  88%   - 01/05/2013  Walked 2lpm 2 laps @ 185 ft each stopped due to  90% sob but improved vs baseline   - RA 05/01/2013 sats = 87%    Rx= 02 2lpm 24/7 but ok to titrate down daytime if monitors, reviewed goal of > 90%

## 2013-05-15 NOTE — Patient Instructions (Addendum)
For now stay 02 2lpm 24 h per day but if you want to try off at rest it's ok as long as over 90%  Plan A  Automatic=    Spriva one capsule daily each am  Plus nebulizer budesonide/formoterol twice daily   Only use your albuterol (plan B ventolin, plan C is the nebulizer)  as a rescue medication to be used if you can't catch your breath by resting or doing a relaxed purse lip breathing pattern. The less you use it, the better it will work when you need it. Ok to use up to every 4 hours if needed with goal of not needing it at all.  Plan D =  Doctor, call me if needing the nebulized albuterol more as you come off the prednisone  For cough/ congestion >  mucinex 600 mg 1-2 every 12 hours as needed plus use flutter valve   Please remember to go to the   x-ray department downstairs for your tests - we will call you with the results when they are available.  Keep previous appt Jun 9

## 2013-05-15 NOTE — Assessment & Plan Note (Signed)
-   PFTs 03/24/2012  FEV1  0.91 (43%) ratio 48 and no better p B2,  DLCO 38% corrects to 53%  - HFA 50% effective 05/09/2012 but having trouble triggering tudorza  > d/c  - changed to perforomist/bud bid 04/2013   Improved on present > will see how she does as the steroids are tapered off  Each maintenance medication was reviewed in detail including most importantly the difference between maintenance and as needed and under what circumstances the prns are to be used.  Please see instructions for details which were reviewed in writing and the patient given a copy.

## 2013-05-19 ENCOUNTER — Telehealth: Payer: Self-pay | Admitting: Internal Medicine

## 2013-05-19 DIAGNOSIS — I2789 Other specified pulmonary heart diseases: Secondary | ICD-10-CM | POA: Diagnosis not present

## 2013-05-19 DIAGNOSIS — J9620 Acute and chronic respiratory failure, unspecified whether with hypoxia or hypercapnia: Secondary | ICD-10-CM | POA: Diagnosis not present

## 2013-05-19 DIAGNOSIS — J438 Other emphysema: Secondary | ICD-10-CM | POA: Diagnosis not present

## 2013-05-19 DIAGNOSIS — F17200 Nicotine dependence, unspecified, uncomplicated: Secondary | ICD-10-CM | POA: Diagnosis not present

## 2013-05-19 DIAGNOSIS — J45901 Unspecified asthma with (acute) exacerbation: Secondary | ICD-10-CM | POA: Diagnosis not present

## 2013-05-19 DIAGNOSIS — Z9981 Dependence on supplemental oxygen: Secondary | ICD-10-CM | POA: Diagnosis not present

## 2013-05-19 MED ORDER — TIOTROPIUM BROMIDE MONOHYDRATE 18 MCG IN CAPS: 18.0000 ug | ORAL_CAPSULE | Freq: Every day | RESPIRATORY_TRACT | Status: DC

## 2013-05-19 NOTE — Progress Notes (Signed)
Quick Note:  Spoke with pt and notified of results per Dr. Wert. Pt verbalized understanding and denied any questions.  ______ 

## 2013-05-19 NOTE — Telephone Encounter (Signed)
Spoke with pt She states needing a refill on her spiriva Rx was sent and nothing further needed per pt

## 2013-05-22 DIAGNOSIS — J45901 Unspecified asthma with (acute) exacerbation: Secondary | ICD-10-CM | POA: Diagnosis not present

## 2013-05-22 DIAGNOSIS — I2789 Other specified pulmonary heart diseases: Secondary | ICD-10-CM | POA: Diagnosis not present

## 2013-05-22 DIAGNOSIS — Z9981 Dependence on supplemental oxygen: Secondary | ICD-10-CM | POA: Diagnosis not present

## 2013-05-22 DIAGNOSIS — J438 Other emphysema: Secondary | ICD-10-CM | POA: Diagnosis not present

## 2013-05-22 DIAGNOSIS — F17200 Nicotine dependence, unspecified, uncomplicated: Secondary | ICD-10-CM | POA: Diagnosis not present

## 2013-05-22 DIAGNOSIS — J9620 Acute and chronic respiratory failure, unspecified whether with hypoxia or hypercapnia: Secondary | ICD-10-CM | POA: Diagnosis not present

## 2013-05-27 DIAGNOSIS — Z9981 Dependence on supplemental oxygen: Secondary | ICD-10-CM | POA: Diagnosis not present

## 2013-05-27 DIAGNOSIS — I2789 Other specified pulmonary heart diseases: Secondary | ICD-10-CM | POA: Diagnosis not present

## 2013-05-27 DIAGNOSIS — J45901 Unspecified asthma with (acute) exacerbation: Secondary | ICD-10-CM | POA: Diagnosis not present

## 2013-05-27 DIAGNOSIS — J9620 Acute and chronic respiratory failure, unspecified whether with hypoxia or hypercapnia: Secondary | ICD-10-CM | POA: Diagnosis not present

## 2013-05-27 DIAGNOSIS — F17200 Nicotine dependence, unspecified, uncomplicated: Secondary | ICD-10-CM | POA: Diagnosis not present

## 2013-05-27 DIAGNOSIS — J438 Other emphysema: Secondary | ICD-10-CM | POA: Diagnosis not present

## 2013-05-28 DIAGNOSIS — J9620 Acute and chronic respiratory failure, unspecified whether with hypoxia or hypercapnia: Secondary | ICD-10-CM | POA: Diagnosis not present

## 2013-05-28 DIAGNOSIS — J438 Other emphysema: Secondary | ICD-10-CM | POA: Diagnosis not present

## 2013-05-28 DIAGNOSIS — Z9981 Dependence on supplemental oxygen: Secondary | ICD-10-CM | POA: Diagnosis not present

## 2013-05-28 DIAGNOSIS — J441 Chronic obstructive pulmonary disease with (acute) exacerbation: Secondary | ICD-10-CM | POA: Diagnosis not present

## 2013-05-28 DIAGNOSIS — F17200 Nicotine dependence, unspecified, uncomplicated: Secondary | ICD-10-CM | POA: Diagnosis not present

## 2013-05-28 DIAGNOSIS — I2789 Other specified pulmonary heart diseases: Secondary | ICD-10-CM | POA: Diagnosis not present

## 2013-06-01 ENCOUNTER — Ambulatory Visit (INDEPENDENT_AMBULATORY_CARE_PROVIDER_SITE_OTHER): Payer: Medicare Other | Admitting: Internal Medicine

## 2013-06-01 ENCOUNTER — Encounter: Payer: Self-pay | Admitting: Internal Medicine

## 2013-06-01 VITALS — BP 108/68 | HR 80 | Temp 96.7°F | Ht 67.0 in | Wt 115.0 lb

## 2013-06-01 DIAGNOSIS — J449 Chronic obstructive pulmonary disease, unspecified: Secondary | ICD-10-CM

## 2013-06-01 DIAGNOSIS — J9610 Chronic respiratory failure, unspecified whether with hypoxia or hypercapnia: Secondary | ICD-10-CM | POA: Diagnosis not present

## 2013-06-01 NOTE — Progress Notes (Signed)
Subjective:    Patient ID: [name]    DOB: April 13, 1932   MRN: [id number]  HPI  33 yowf active smoker referred 02/08/2012 by [name] for intermittent sob was on 02 but stopped in early 2013    02/08/2012 1st pulmonary ov/ cc "intermittently" bad breathing x 2-3 y  but on  best day can do  [employer] full aisles  but not mall with episodes much worse last starting 3 weeks prior to OV  rx already by abx and prednisone and some better.  Assoc with congested cough slt discolored better p abx but really has year round am cough x years, worse in winter rec Moderate copd/ emphysema (not severe) I reviewed the Flethcher curve    Work on inhaler technique:  Late add:  Needs cxr on return  03/24/2012 f/u ov/[name] cc doe not changed only  using albuterol but 4 x weekly, minimal am cough, mucoid. No active sinus or hb symptoms rec pt wants alternative to spiriva...feet and ankles swelling since beginning treatment 03/24/12 Pt stopped using 05/06/12   05/09/2012 f/u ov/[name] cc leg swelling p first box of spiriva but did feel it helped breathing, swelling is worse on L sltly, better since held the spiriva. 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 No cough, overt sinus or reflux symptoms. rec Try tudorza one twice daily - if you don't see the green turn red it didn't work  Please see patient coordinator before you leave today  to schedule overnight oximetry on room air Please schedule a follow up office visit in 4 weeks, sooner if needed   06/12/2012 f/u ov/[name] still smoking cc sleeping ok on 02 no am wakes up feeling rested no ha.  [place]oe x > [employer], usually does not use [facility] parking. No variability or overt sinus or hb symptoms or need for daytime saba.  Not interested in daytime 02 or committed to stop smoking at this point. Likes spiriva > tudorza because not able to trigger the tudorza consistently. rec The key is to stop smoking completely before smoking completely stops you - no  change in meds    08/11/2012 f/u ov/[name] cc missed appt  09/24/2012 f/u ov/[name] still smoking cc doe x ok at [employer] pushing cart rec Work on inhaler technique/ f/u prn   01/05/2013 f/u ov/[name] cc no change in breathing although had aecopd and was provided with nebulizer but no clearly what she's using in it >  back to baseline doe ([employer] shopping)and using hfa twice daily and neb once daily. Has congested cough, still smoking rec Change 02 regimen:  2lpm baseline and walking outside of home  Plan A  Continue Spriva one capsule daily each am and stop smoking Only use your albuterol (plan B proaire, plan C is the nebulizer)  as a rescue medication to be used if you can't catch your breath by resting or doing a relaxed purse lip breathing pattern. The less you use it, the better it will work when you need it. Ok to use up to every 4 hours. For cough/ congestion >  mucinex 600 mg 1-2 every 12 hours as needed Call back with name of neb   05/01/2013 f/u ov/[name] re copd/ most 2lpm 20 h/d  always wears sleeping no longer smoking 60 days Chief Complaint  Patient presents with  . Follow-up    Breathing worse x 1 wk. Out of breath walking from parking lot to our building this morning. Sats 79%  on ra- she has her portable o2 but not sure how to use it. Sats increased to 92%2lpm..   started prednisone 3 days prior to OV  Per S Tysinger, not really better yet, not using spiriva correctly, confused with action plan and still doesn't know names of meds/ different types of albuterol. No change in chronic cough with this flare and really not doing much better since quit smoking  rec Wear 02  2lpm 24 hours per day Start symbicort 160 Take 2 puffs first thing in am and then another 2 puffs about 12 hours later.  Continue spiriva one capsule immediately after your am dose of symbicort Work on inhaler technique.  Admit date: 05/05/2013  Discharge date: 05/11/2013  Admission Diagnoses:  Dyspnea and cough   Discharge Diagnoses:  Active Problems:  COPD GOLD III  Abnormal CXR  Acute-on-chronic respiratory failure    Admitted for refractory cough and dyspnea. On CXR she had what looked like a new cavitary lesion in the LUL. She had sputum for AFBs which were negative, her Quantiferon gold was negative. She had a CT scan to better define the CXR findings which showed mucous plugging rather than new cavitary changes. Treatment was then aimed at continuing antibiotics for AECOPD, pulmonary hygiene and supportive care. She has improved but is not yet at baseline at time of discharge. From a pulmonary standpoint her plan of care will be as follows:  Plan  Oxygen-->2 liters at rest; increase as needed to sat > 90%  Slow pred taper  Changed symbicort to brovana and budesonide.  Resume spiriva  F/u with [place] in our office       05/15/2013 post hosp f/u / [name] on 2lpm/ tapering prednisone off Chief Complaint  Patient presents with  . HFU    Pt states that her breathing is much improved, almost back at baseline.    maint on performist/pulmocort/spiriva still very sedentary.  rec For now stay 02 2lpm 24 h per day but if you want to try off at rest it's ok as long as over 90%  Plan A  Automatic=    Spriva one capsule daily each am  Plus nebulizer budesonide/formoterol twice daily   Only use your albuterol (plan B ventolin, plan C is the nebulizer)  as a rescue medication to be used if you can't catch your breath by resting or doing a relaxed purse lip breathing pattern. The less you use it, the better it will work when you need it. Ok to use up to every 4 hours if needed with goal of not needing it at all. Plan D =  Doctor, call me if needing the nebulized albuterol more as you come off the prednisone For cough/ congestion >  mucinex 600 mg 1-2 every 12 hours as needed plus use flutter valve    06/01/2013 f/u ov/[name] re chronic 02 dep resp/copd/ not smoking Chief Complaint  Patient presents with  .  Follow-up    Pt states breathing is doing well, back at baseline for her. No new co's today.   cough and congestion better in am, using only plan A.  Mucus is clear   No obvious daytime variabilty or assoc  cp or chest tightness, subjective wheeze overt sinus or hb symptoms. No unusual exp hx .   Sleeping ok on 2lpm without nocturnal  or early am exacerbation  of respiratory  c/o's or need for noct saba. Also denies any obvious fluctuation of symptoms with weather or environmental changes  or other aggravating or alleviating factors except as outlined above.     Current Medications, Allergies, Past Medical History, Past Surgical History, Family History, and Social History were reviewed in [employer] record.  ROS  The following are not active complaints unless bolded sore throat, dysphagia, dental problems, itching, sneezing,  nasal congestion or excess/ purulent secretions, ear ache,   fever, chills, sweats, unintended wt loss, pleuritic or exertional cp, hemoptysis,  orthopnea pnd or leg swelling, presyncope, palpitations, heartburn, abdominal pain, anorexia, nausea, vomiting, diarrhea  or change in bowel or urinary habits, change in stools or urine, dysuria,hematuria,  rash, arthralgias, visual complaints, headache, numbness weakness or ataxia or problems with walking or coordination,  change in mood/affect or memory.              Objective:   Physical Exam   Thin amb wf 97 02/08/2012 > 03/24/2012  99 > 103 05/09/2012 > 06/12/2012  99 >   09/24/2012  96 > 98 01/05/2013 >  106 05/01/2013 > 05/05/2013  99 > 05/15/2013  104 > 06/01/2013 115 Congested rattling cough much better  HEENT mild turbinate edema.  Oropharynx no thrush or excess pnd or cobblestoning.  No JVD or cervical adenopathy. Mild accessory muscle hypertrophy. Trachea midline, nl thryroid. Chest was hyperinflated by percussion with diminished breath sounds and moderate increased exp time without wheeze. Hoover sign  positive at mid inspiration. Regular rate and rhythm without murmur gallop or rub or increase P2 edema resolved.  Abd: no hsm, nl excursion. Ext warm without cyanosis or clubbing.    CXR  05/15/2013 :   Streaky densities at both lung bases. Previously seen small areas of patchy bilateral lower lobe air space disease is not as well seen but can be obscured on routine chest radiography         Assessment & Plan:  st.

## 2013-06-01 NOTE — Patient Instructions (Addendum)
Be sure to use your nebulizer with budesonide and formoterol first thing in am and chase with spiriva, repeat 12 hours later without spiriva  Work on inhaler technique:  relax and gently blow all the way out then take a nice smooth deep breath back in, triggering the inhaler at same time you start breathing in.  Hold for up to 5 seconds if you can.  Rinse and gargle with water when done   If your mouth or throat starts to bother you,   I suggest you time the inhaler to your dental care and after using the inhaler(s) brush teeth and tongue with a baking soda containing toothpaste and when you rinse this out, gargle with it first to see if this helps your mouth and throat.     Please schedule a follow up office visit in 6 weeks, call sooner if needed bring all meds in 2 separate bags the ones you use not matter what vs the ones you use as needed.

## 2013-06-02 ENCOUNTER — Telehealth: Payer: Self-pay | Admitting: Internal Medicine

## 2013-06-02 NOTE — Assessment & Plan Note (Addendum)
-   PFTs 03/24/2012  FEV1  0.91 (43%) ratio 48 and no better p B2,  DLCO 38% corrects to 53%  - HFA 75% 06/01/2013   - changed to perforomist/bud bid 04/2013  Still struggling with concept of maint vs prns, could not remember any of the action plans outline in prev avs instructions.  The proper method of use, as well as anticipated side effects, of a metered-dose inhaler are discussed and demonstrated to the patient. Improved effectiveness after extensive coaching during this visit to a level of approximately  75% so ok to always use hfa first and then the nebulizer albuterol if needed    Each maintenance medication was reviewed in detail including most importantly the difference between maintenance and as needed and under what circumstances the prns are to be used.  Please see instructions for details which were reviewed in writing and the patient given a copy.

## 2013-06-02 NOTE — Assessment & Plan Note (Signed)
-   ono RA 05/14/12   4 h 33 min < 89%   So ordered 02 2lpm    - HCO3  28  05/27/12   - 01/05/2013   Walked RA x one lap @ 185 stopped due to  88%   - 01/05/2013  Walked 2lpm 2 laps @ 185 ft each stopped due to  90% sob but improved vs baseline   - RA 05/01/2013 sats = 87%    Rx= 02 2lpm 24/7 but ok to titrate down daytime if monitors her own sats

## 2013-06-02 NOTE — Telephone Encounter (Signed)
I spoke with pt and she stated she does not need a refill on proair. She stated she has 3 refills left. She needed nothing further

## 2013-06-03 DIAGNOSIS — J438 Other emphysema: Secondary | ICD-10-CM | POA: Diagnosis not present

## 2013-06-03 DIAGNOSIS — I2789 Other specified pulmonary heart diseases: Secondary | ICD-10-CM | POA: Diagnosis not present

## 2013-06-03 DIAGNOSIS — Z9981 Dependence on supplemental oxygen: Secondary | ICD-10-CM | POA: Diagnosis not present

## 2013-06-03 DIAGNOSIS — F17200 Nicotine dependence, unspecified, uncomplicated: Secondary | ICD-10-CM | POA: Diagnosis not present

## 2013-06-03 DIAGNOSIS — J45901 Unspecified asthma with (acute) exacerbation: Secondary | ICD-10-CM | POA: Diagnosis not present

## 2013-06-03 DIAGNOSIS — J9620 Acute and chronic respiratory failure, unspecified whether with hypoxia or hypercapnia: Secondary | ICD-10-CM | POA: Diagnosis not present

## 2013-06-04 DIAGNOSIS — J45901 Unspecified asthma with (acute) exacerbation: Secondary | ICD-10-CM | POA: Diagnosis not present

## 2013-06-04 DIAGNOSIS — I2789 Other specified pulmonary heart diseases: Secondary | ICD-10-CM | POA: Diagnosis not present

## 2013-06-04 DIAGNOSIS — F17200 Nicotine dependence, unspecified, uncomplicated: Secondary | ICD-10-CM | POA: Diagnosis not present

## 2013-06-04 DIAGNOSIS — J9620 Acute and chronic respiratory failure, unspecified whether with hypoxia or hypercapnia: Secondary | ICD-10-CM | POA: Diagnosis not present

## 2013-06-04 DIAGNOSIS — Z9981 Dependence on supplemental oxygen: Secondary | ICD-10-CM | POA: Diagnosis not present

## 2013-06-04 DIAGNOSIS — J438 Other emphysema: Secondary | ICD-10-CM | POA: Diagnosis not present

## 2013-06-05 DIAGNOSIS — Z9981 Dependence on supplemental oxygen: Secondary | ICD-10-CM | POA: Diagnosis not present

## 2013-06-05 DIAGNOSIS — J438 Other emphysema: Secondary | ICD-10-CM | POA: Diagnosis not present

## 2013-06-05 DIAGNOSIS — J45901 Unspecified asthma with (acute) exacerbation: Secondary | ICD-10-CM | POA: Diagnosis not present

## 2013-06-05 DIAGNOSIS — I2789 Other specified pulmonary heart diseases: Secondary | ICD-10-CM | POA: Diagnosis not present

## 2013-06-05 DIAGNOSIS — F17200 Nicotine dependence, unspecified, uncomplicated: Secondary | ICD-10-CM | POA: Diagnosis not present

## 2013-06-05 DIAGNOSIS — J9620 Acute and chronic respiratory failure, unspecified whether with hypoxia or hypercapnia: Secondary | ICD-10-CM | POA: Diagnosis not present

## 2013-06-08 DIAGNOSIS — J45901 Unspecified asthma with (acute) exacerbation: Secondary | ICD-10-CM | POA: Diagnosis not present

## 2013-06-08 DIAGNOSIS — Z9981 Dependence on supplemental oxygen: Secondary | ICD-10-CM | POA: Diagnosis not present

## 2013-06-08 DIAGNOSIS — J9620 Acute and chronic respiratory failure, unspecified whether with hypoxia or hypercapnia: Secondary | ICD-10-CM | POA: Diagnosis not present

## 2013-06-08 DIAGNOSIS — J438 Other emphysema: Secondary | ICD-10-CM | POA: Diagnosis not present

## 2013-06-08 DIAGNOSIS — F17200 Nicotine dependence, unspecified, uncomplicated: Secondary | ICD-10-CM | POA: Diagnosis not present

## 2013-06-08 DIAGNOSIS — I2789 Other specified pulmonary heart diseases: Secondary | ICD-10-CM | POA: Diagnosis not present

## 2013-06-10 DIAGNOSIS — F17200 Nicotine dependence, unspecified, uncomplicated: Secondary | ICD-10-CM | POA: Diagnosis not present

## 2013-06-10 DIAGNOSIS — Z9981 Dependence on supplemental oxygen: Secondary | ICD-10-CM | POA: Diagnosis not present

## 2013-06-10 DIAGNOSIS — J438 Other emphysema: Secondary | ICD-10-CM | POA: Diagnosis not present

## 2013-06-10 DIAGNOSIS — I2789 Other specified pulmonary heart diseases: Secondary | ICD-10-CM | POA: Diagnosis not present

## 2013-06-10 DIAGNOSIS — J9620 Acute and chronic respiratory failure, unspecified whether with hypoxia or hypercapnia: Secondary | ICD-10-CM | POA: Diagnosis not present

## 2013-06-10 DIAGNOSIS — J441 Chronic obstructive pulmonary disease with (acute) exacerbation: Secondary | ICD-10-CM | POA: Diagnosis not present

## 2013-06-15 DIAGNOSIS — Z9981 Dependence on supplemental oxygen: Secondary | ICD-10-CM | POA: Diagnosis not present

## 2013-06-15 DIAGNOSIS — J441 Chronic obstructive pulmonary disease with (acute) exacerbation: Secondary | ICD-10-CM | POA: Diagnosis not present

## 2013-06-15 DIAGNOSIS — J438 Other emphysema: Secondary | ICD-10-CM | POA: Diagnosis not present

## 2013-06-15 DIAGNOSIS — J9620 Acute and chronic respiratory failure, unspecified whether with hypoxia or hypercapnia: Secondary | ICD-10-CM | POA: Diagnosis not present

## 2013-06-15 DIAGNOSIS — I2789 Other specified pulmonary heart diseases: Secondary | ICD-10-CM | POA: Diagnosis not present

## 2013-06-15 DIAGNOSIS — F17200 Nicotine dependence, unspecified, uncomplicated: Secondary | ICD-10-CM | POA: Diagnosis not present

## 2013-06-17 DIAGNOSIS — J438 Other emphysema: Secondary | ICD-10-CM | POA: Diagnosis not present

## 2013-06-17 DIAGNOSIS — F17200 Nicotine dependence, unspecified, uncomplicated: Secondary | ICD-10-CM | POA: Diagnosis not present

## 2013-06-17 DIAGNOSIS — Z9981 Dependence on supplemental oxygen: Secondary | ICD-10-CM | POA: Diagnosis not present

## 2013-06-17 DIAGNOSIS — J45901 Unspecified asthma with (acute) exacerbation: Secondary | ICD-10-CM | POA: Diagnosis not present

## 2013-06-17 DIAGNOSIS — I2789 Other specified pulmonary heart diseases: Secondary | ICD-10-CM | POA: Diagnosis not present

## 2013-06-17 DIAGNOSIS — J9620 Acute and chronic respiratory failure, unspecified whether with hypoxia or hypercapnia: Secondary | ICD-10-CM | POA: Diagnosis not present

## 2013-06-18 DIAGNOSIS — J9620 Acute and chronic respiratory failure, unspecified whether with hypoxia or hypercapnia: Secondary | ICD-10-CM | POA: Diagnosis not present

## 2013-06-18 DIAGNOSIS — J441 Chronic obstructive pulmonary disease with (acute) exacerbation: Secondary | ICD-10-CM | POA: Diagnosis not present

## 2013-06-18 DIAGNOSIS — F17200 Nicotine dependence, unspecified, uncomplicated: Secondary | ICD-10-CM | POA: Diagnosis not present

## 2013-06-18 DIAGNOSIS — J438 Other emphysema: Secondary | ICD-10-CM | POA: Diagnosis not present

## 2013-06-18 DIAGNOSIS — I2789 Other specified pulmonary heart diseases: Secondary | ICD-10-CM | POA: Diagnosis not present

## 2013-06-18 DIAGNOSIS — Z9981 Dependence on supplemental oxygen: Secondary | ICD-10-CM | POA: Diagnosis not present

## 2013-06-19 LAB — AFB CULTURE WITH SMEAR (NOT AT ARMC)
Acid Fast Smear: NONE SEEN
Special Requests: NORMAL

## 2013-06-22 DIAGNOSIS — Z9981 Dependence on supplemental oxygen: Secondary | ICD-10-CM | POA: Diagnosis not present

## 2013-06-22 DIAGNOSIS — J438 Other emphysema: Secondary | ICD-10-CM | POA: Diagnosis not present

## 2013-06-22 DIAGNOSIS — I2789 Other specified pulmonary heart diseases: Secondary | ICD-10-CM | POA: Diagnosis not present

## 2013-06-22 DIAGNOSIS — F17200 Nicotine dependence, unspecified, uncomplicated: Secondary | ICD-10-CM | POA: Diagnosis not present

## 2013-06-22 DIAGNOSIS — J9620 Acute and chronic respiratory failure, unspecified whether with hypoxia or hypercapnia: Secondary | ICD-10-CM | POA: Diagnosis not present

## 2013-06-22 DIAGNOSIS — J45901 Unspecified asthma with (acute) exacerbation: Secondary | ICD-10-CM | POA: Diagnosis not present

## 2013-06-24 DIAGNOSIS — J441 Chronic obstructive pulmonary disease with (acute) exacerbation: Secondary | ICD-10-CM | POA: Diagnosis not present

## 2013-06-24 DIAGNOSIS — Z9981 Dependence on supplemental oxygen: Secondary | ICD-10-CM | POA: Diagnosis not present

## 2013-06-24 DIAGNOSIS — I2789 Other specified pulmonary heart diseases: Secondary | ICD-10-CM | POA: Diagnosis not present

## 2013-06-24 DIAGNOSIS — J9620 Acute and chronic respiratory failure, unspecified whether with hypoxia or hypercapnia: Secondary | ICD-10-CM | POA: Diagnosis not present

## 2013-06-24 DIAGNOSIS — J438 Other emphysema: Secondary | ICD-10-CM | POA: Diagnosis not present

## 2013-06-24 DIAGNOSIS — F17200 Nicotine dependence, unspecified, uncomplicated: Secondary | ICD-10-CM | POA: Diagnosis not present

## 2013-07-02 ENCOUNTER — Telehealth: Payer: Self-pay | Admitting: Internal Medicine

## 2013-07-02 MED ORDER — FORMOTEROL FUMARATE 20 MCG/2ML IN NEBU: 20.0000 ug | INHALATION_SOLUTION | Freq: Two times a day (BID) | RESPIRATORY_TRACT | Status: DC

## 2013-07-02 MED ORDER — BUDESONIDE 0.25 MG/2ML IN SUSP: 0.2500 mg | Freq: Two times a day (BID) | RESPIRATORY_TRACT | Status: DC

## 2013-07-02 NOTE — Telephone Encounter (Signed)
done

## 2013-07-02 NOTE — Telephone Encounter (Signed)
Rx was faxed to pharm to the number given

## 2013-07-02 NOTE — Telephone Encounter (Signed)
I spoke with the pt and she states over the last few days she has been having increase cough at night. Cough is productive with white phlegm. Pt denies any increase in SOB, chest tightness, wheezing at this time.  The pt states she has been taking mucinex and used her albuterol last night and this morning. When I asked the pt about her other neb medications she states she does not have any. I advised that according to med list and last OV note she was supposed to take performist and budesonide twice daily and spiriva in the morning. The pt states that she ran out of neb meds and did not know she was supposed to continue them so she has been without them for several weeks. I advised the pt that she needs to get on her meds as prescribed and this will help her symptoms. According to chart orders were sent to [facility] for neb meds back on May, so I advised the pt I will call them to see why they have not been sent. I called [facility] and was transferred to respiratory where I was then sent to voicemail. Now meds have to be [facility] to [facility] fax # [phone]. Rx have been printed and placed on MW desk to sign. These need to be faxed to number given once signed. Carron Curie, CMA

## 2013-07-02 NOTE — Telephone Encounter (Signed)
Error- new msg coming. [name]

## 2013-07-03 ENCOUNTER — Telehealth: Payer: Self-pay | Admitting: Internal Medicine

## 2013-07-03 NOTE — Telephone Encounter (Signed)
Pt advised [facility] and that meds will be delivered to her. Carron Curie, CMA

## 2013-07-06 ENCOUNTER — Telehealth: Payer: Self-pay | Admitting: Internal Medicine

## 2013-07-06 NOTE — Telephone Encounter (Signed)
I spoke with [name], covering for [name] at [facility], and she states that they can send OV notes for any patient just to let them know which patients it is needed on. I have provided her with this [name] information and asked that they re-fax the prescriptions as well because the pharmacy states they did not receive them. [name] states she will take care of this. [name] has appt tomorrow with MW.  Carron Curie, CMA

## 2013-07-06 NOTE — Telephone Encounter (Signed)
Darl Pikes from [facility] says that they do have info on pt but they need rx and ov note in order to fill the rx she is faxing a request over she can be reached at [phone].Raylene Everts

## 2013-07-06 NOTE — Telephone Encounter (Signed)
I spoke with pt. She never received her nebulizer medications through [facility]. She stated she feels this is why her cough is getting worse. She wants to see WM and he is not in today. She stated she will keep appt scheduled for tomorrow.  I called [facility] and was advised they no longer supply neb meds and referred all their pt[facility] to [facility]. I was advised by candace through [facility] that they did fax over pt rx's to the new pharmacy. I called [facility] to check on this.   I called advised by [facility] they will have to give me a call back after they pull her chart and see what is going on. Will await call back

## 2013-07-07 ENCOUNTER — Ambulatory Visit (INDEPENDENT_AMBULATORY_CARE_PROVIDER_SITE_OTHER): Payer: Medicare Other | Admitting: Internal Medicine

## 2013-07-07 ENCOUNTER — Encounter: Payer: Self-pay | Admitting: Internal Medicine

## 2013-07-07 VITALS — BP 160/90 | HR 114 | Temp 97.9°F | Ht 67.0 in | Wt 114.8 lb

## 2013-07-07 DIAGNOSIS — J441 Chronic obstructive pulmonary disease with (acute) exacerbation: Secondary | ICD-10-CM

## 2013-07-07 DIAGNOSIS — J9610 Chronic respiratory failure, unspecified whether with hypoxia or hypercapnia: Secondary | ICD-10-CM | POA: Diagnosis not present

## 2013-07-07 DIAGNOSIS — J4489 Other specified chronic obstructive pulmonary disease: Secondary | ICD-10-CM

## 2013-07-07 DIAGNOSIS — J449 Chronic obstructive pulmonary disease, unspecified: Secondary | ICD-10-CM | POA: Diagnosis not present

## 2013-07-07 MED ORDER — PREDNISONE (PAK) 10 MG PO TABS: ORAL_TABLET | ORAL | Status: DC

## 2013-07-07 MED ORDER — METHYLPREDNISOLONE ACETATE 80 MG/ML IJ SUSP
120.0000 mg | Freq: Once | INTRAMUSCULAR | Status: AC
  Administered 2013-07-07: 120 mg via INTRAMUSCULAR

## 2013-07-07 NOTE — Progress Notes (Signed)
Subjective:    Patient ID: [name]    DOB: 1932/05/19   MRN: [id number]  HPI  73 yowf active smoker referred 02/08/2012 by [name] for intermittent sob was on 02 but stopped in early 2013    02/08/2012 1st pulmonary ov/ cc "intermittently" bad breathing x 2-3 y  but on  best day can do  [employer] full aisles  but not mall with episodes much worse last starting 3 weeks prior to OV  rx already by abx and prednisone and some better.  Assoc with congested cough slt discolored better p abx but really has year round am cough x years, worse in winter rec Moderate copd/ emphysema (not severe) I reviewed the Flethcher curve    Work on inhaler technique:  Late add:  Needs cxr on return  03/24/2012 f/u ov/Storm Sovine cc doe not changed only  using albuterol but 4 x weekly, minimal am cough, mucoid. No active sinus or hb symptoms rec pt wants alternative to spiriva...feet and ankles swelling since beginning treatment 03/24/12 Pt stopped using 05/06/12   05/09/2012 f/u ov/[name] cc leg swelling p first box of spiriva but did feel it helped breathing, swelling is worse on L sltly, better since held the spiriva. 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 No cough, overt sinus or reflux symptoms. rec Try tudorza one twice daily - if you don't see the green turn red it didn't work  Please see patient coordinator before you leave today  to schedule overnight oximetry on room air Please schedule a follow up office visit in 4 weeks, sooner if needed   06/12/2012 f/u ov/Renly Roots still smoking cc sleeping ok on 02 no am wakes up feeling rested no ha.  [place] [employer] > [employer], usually does not use [facility] parking. No variability or overt sinus or hb symptoms or need for daytime saba.  Not interested in daytime 02 or committed to stop smoking at this point. Likes spiriva > tudorza because not able to trigger the tudorza consistently. rec The key is to stop smoking completely before smoking completely stops you - no  change in meds    08/11/2012 f/u ov/Aero Drummonds cc missed appt  09/24/2012 f/u ov/Kiki Bivens still smoking cc doe x ok at [employer] pushing cart rec Work on inhaler technique/ f/u prn   01/05/2013 f/u ov/An Lannan cc no change in breathing although had aecopd and was provided with nebulizer but no clearly what she's using in it >  back to baseline doe ([employer] shopping)and using hfa twice daily and neb once daily. Has congested cough, still smoking rec Change 02 regimen:  2lpm baseline and walking outside of home  Plan A  Continue Spriva one capsule daily each am and stop smoking Only use your albuterol (plan B proaire, plan C is the nebulizer)  as a rescue medication to be used if you can't catch your breath by resting or doing a relaxed purse lip breathing pattern. The less you use it, the better it will work when you need it. Ok to use up to every 4 hours. For cough/ congestion >  mucinex 600 mg 1-2 every 12 hours as needed Call back with name of neb   05/01/2013 f/u ov/[name] re copd/ most 2lpm 20 h/d  always wears sleeping no longer smoking 60 days Chief Complaint  Patient presents with  . Follow-up    Breathing worse x 1 wk. Out of breath walking from parking lot to our building this morning. Sats 79%  on ra- she has her portable o2 but not sure how to use it. Sats increased to 92%2lpm..   started prednisone 3 days prior to OV  Per S Tysinger, not really better yet, not using spiriva correctly, confused with action plan and still doesn't know names of meds/ different types of albuterol. No change in chronic cough with this flare and really not doing much better since quit smoking  rec Wear 02  2lpm 24 hours per day Start symbicort 160 Take 2 puffs first thing in am and then another 2 puffs about 12 hours later.  Continue spiriva one capsule immediately after your am dose of symbicort Work on inhaler technique.  Admit date: 05/05/2013  Discharge date: 05/11/2013  Admission Diagnoses:  Dyspnea and cough   Discharge Diagnoses:  Active Problems:  COPD GOLD III  Abnormal CXR  Acute-on-chronic respiratory failure    Admitted for refractory cough and dyspnea. On CXR she had what looked like a new cavitary lesion in the LUL. She had sputum for AFBs which were negative, her Quantiferon gold was negative. She had a CT scan to better define the CXR findings which showed mucous plugging rather than new cavitary changes. Treatment was then aimed at continuing antibiotics for AECOPD, pulmonary hygiene and supportive care. She has improved but is not yet at baseline at time of discharge. From a pulmonary standpoint her plan of care will be as follows:  Plan  Oxygen-->2 liters at rest; increase as needed to sat > 90%  Slow pred taper  Changed symbicort to brovana and budesonide.  Resume spiriva  F/u with [place] in our office       05/15/2013 post hosp f/u / [name] on 2lpm/ tapering prednisone off Chief Complaint  Patient presents with  . HFU    Pt states that her breathing is much improved, almost back at baseline.    maint on performist/pulmocort/spiriva still very sedentary.  rec For now stay 02 2lpm 24 h per day but if you want to try off at rest it's ok as long as over 90%  Plan A  Automatic=    Spriva one capsule daily each am  Plus nebulizer budesonide/formoterol twice daily   Only use your albuterol (plan B ventolin, plan C is the nebulizer)  as a rescue medication to be used if you can't catch your breath by resting or doing a relaxed purse lip breathing pattern. The less you use it, the better it will work when you need it. Ok to use up to every 4 hours if needed with goal of not needing it at all. Plan D =  Doctor, call me if needing the nebulized albuterol more as you come off the prednisone For cough/ congestion >  mucinex 600 mg 1-2 every 12 hours as needed plus use flutter valve    06/01/2013 f/u ov/Phu Record re chronic 02 dep resp/copd/ not smoking Chief Complaint  Patient presents with  .  Follow-up    Pt states breathing is doing well, back at baseline for her. No new co's today.   cough and congestion better in am, using only plan A.  Mucus is clear rec Be sure to use your nebulizer with budesonide and formoterol first thing in am and chase with spiriva, repeat 12 hours later without spiriva   07/09/2013 f/u ov/Terren Haberle re severe copd/ 02 dep Chief Complaint  Patient presents with  . Acute Visit    Pt c/o increased cough x 1 wk- prod with minimal clear to  yellow sputum. She also c/o having increased SOB for the past wk.    confused with instructions, access to meds, couldn't even find pepcid otc and worse since ran out of meds.  No def purulent sputum, better p saba.     No obvious daytime variabilty or assoc  cp or chest tightness, subjective wheeze overt sinus or hb symptoms. No unusual exp hx .   Sleeping ok on 2lpm without nocturnal  or early am exacerbation  of respiratory  c/o's or need for noct saba. Also denies any obvious fluctuation of symptoms with weather or environmental changes or other aggravating or alleviating factors except as outlined above.     Current Medications, Allergies, Past Medical History, Past Surgical History, Family History, and Social History were reviewed in [employer] record.  ROS  The following are not active complaints unless bolded sore throat, dysphagia, dental problems, itching, sneezing,  nasal congestion or excess/ purulent secretions, ear ache,   fever, chills, sweats, unintended wt loss, pleuritic or exertional cp, hemoptysis,  orthopnea pnd or leg swelling, presyncope, palpitations, heartburn, abdominal pain, anorexia, nausea, vomiting, diarrhea  or change in bowel or urinary habits, change in stools or urine, dysuria,hematuria,  rash, arthralgias, visual complaints, headache, numbness weakness or ataxia or problems with walking or coordination,  change in mood/affect or memory.              Objective:    Physical Exam   Thin amb wf with hopeless helpless affect  Wt 97 02/08/2012 > 03/24/2012  99 > 103 05/09/2012 > 06/12/2012  99 >   09/24/2012  96 > 98 01/05/2013 >  106 05/01/2013 > 05/05/2013  99 > 05/15/2013  104 > 06/01/2013 115 > 115 07/07/2013   Congested rattling cough   HEENT mild turbinate edema.  Oropharynx no thrush or excess pnd or cobblestoning.  No JVD or cervical adenopathy. Mild accessory muscle hypertrophy. Trachea midline, nl thryroid. Chest was hyperinflated by percussion with diminished breath sounds and moderate increased exp time without wheeze. Hoover sign positive at mid inspiration. Regular rate and rhythm without murmur gallop or rub or increase P2 edema resolved.  Abd: no hsm, nl excursion. Ext warm without cyanosis or clubbing.    CXR  05/15/2013 :   Streaky densities at both lung bases. Previously seen small areas of patchy bilateral lower lobe air space disease is not as well seen but can be obscured on routine chest radiography         Assessment & Plan:  st.

## 2013-07-07 NOTE — Patient Instructions (Addendum)
For now stay 02 2lpm 24 h per day   Pepcid 20 mg after bfast and at bedtime   Plan A  Automatic=    Spriva one capsule daily each am  Plus nebulizer budesonide/formoterol twice daily   Only use your albuterol (plan B ventolin, plan C is the nebulizer)  as a rescue medication to be used if you can't catch your breath by resting or doing a relaxed purse lip breathing pattern. The less you use it, the better it will work when you need it. Ok to use up to every 4 hours if needed with goal of not needing it at all.  Plan D =  Doctor, call me if needing the nebulized albuterol more as you come off the prednisone  For cough/ congestion >  mucinex 600 mg 1-2 every 12 hours as needed plus use flutter valve   Prednisone 10 mg take  4 each am x 2 days,   2 each am x 2 days,  1 each am x 2 days and stop   See Tammy NP w/in 1 weeks with all your medications, even over the counter meds, separated in two separate bags, the ones you take no matter what vs the ones you stop once you feel better and take only as needed when you feel you need them.   Tammy  will generate for you a new user friendly medication calendar that will put [place] all on the same page re: your medication use.     Without this process, it simply isn't possible to assure that we are providing  your outpatient care  with  the attention to detail we feel you deserve.   If we cannot assure that you're getting that kind of care,  then we cannot manage your problem effectively from this clinic.    Once you have seen Tammy and we are sure that we're all on the same page with your medication use she will arrange follow up with me.    Late add    Make sure has has flutter and knows how to use it

## 2013-07-09 NOTE — Assessment & Plan Note (Signed)
-   ono RA 05/14/12   4 h 33 min < 89%   So ordered 02 2lpm    - HCO3  28  05/27/12   - 01/05/2013   Walked RA x one lap @ 185 stopped due to  88%   - 01/05/2013  Walked 2lpm 2 laps @ 185 ft each stopped due to  90% sob but improved vs baseline   - RA 05/01/2013 sats = 87%    Rx= 02 2lpm 24/7  As of 07/07/13

## 2013-07-09 NOTE — Assessment & Plan Note (Signed)
-   PFTs 03/24/2012  FEV1  0.91 (43%) ratio 48 and no better p B2,  DLCO 38% corrects to 53%  - HFA 75% 06/01/2013   - changed to perforomist/bud bid 04/2013   DDX of  difficult airways managment all start with A and  include Adherence, Ace Inhibitors, Acid Reflux, Active Sinus Disease, Alpha 1 Antitripsin deficiency, Anxiety masquerading as Airways dz,  ABPA,  allergy(esp in young), Aspiration (esp in elderly), Adverse effects of DPI,  Active smokers, plus two Bs  = Bronchiectasis and Beta blocker use..and one C= CHF   I had an extended discussion with the patient and husband today lasting 15 to 20 minutes of a 25 minute visit on the following issues:  Adherence is always the initial "prime suspect" and is a multilayered concern that requires a "trust but verify" approach in every patient - starting with knowing how to use medications, especially inhalers, correctly, keeping up with refills and understanding the fundamental difference between maintenance and prns vs those medications only taken for a very short course and then stopped and not refilled.  Her problem is she doesn't understand how to obtain and use her medications.    We gave her samples of nebs today, but  To keep things simple, I have asked the patient to first separate medicines that are perceived as maintenance, that is to be taken daily "no matter what", from those medicines that are taken on only on an as-needed basis and I have given the patient examples of both, and then return to see our NP to generate a  detailed  medication calendar which should be followed until the next physician sees the patient and updates it.    For mild flare rec pred x 6 days, no abx, rx cough with mucinex and make sure she has and uses flutter approp at f/u

## 2013-07-10 DIAGNOSIS — I2789 Other specified pulmonary heart diseases: Secondary | ICD-10-CM | POA: Diagnosis not present

## 2013-07-10 DIAGNOSIS — J9620 Acute and chronic respiratory failure, unspecified whether with hypoxia or hypercapnia: Secondary | ICD-10-CM | POA: Diagnosis not present

## 2013-07-10 DIAGNOSIS — F17200 Nicotine dependence, unspecified, uncomplicated: Secondary | ICD-10-CM | POA: Diagnosis not present

## 2013-07-10 DIAGNOSIS — Z9981 Dependence on supplemental oxygen: Secondary | ICD-10-CM | POA: Diagnosis not present

## 2013-07-10 DIAGNOSIS — J45901 Unspecified asthma with (acute) exacerbation: Secondary | ICD-10-CM | POA: Diagnosis not present

## 2013-07-10 DIAGNOSIS — J438 Other emphysema: Secondary | ICD-10-CM | POA: Diagnosis not present

## 2013-07-13 ENCOUNTER — Ambulatory Visit: Payer: Medicare Other | Admitting: Internal Medicine

## 2013-07-15 ENCOUNTER — Telehealth: Payer: Self-pay | Admitting: Internal Medicine

## 2013-07-15 NOTE — Telephone Encounter (Signed)
Pt returned call. [name] °

## 2013-07-15 NOTE — Telephone Encounter (Signed)
[id number].[name], CMA  

## 2013-07-15 NOTE — Telephone Encounter (Signed)
Pt wanted to know if she could take her albuterol more then every 4 hours if needed. I advised no and reviewed MW recs of the ABCD plan. Pt states understanding. Carron Curie, CMA

## 2013-07-17 ENCOUNTER — Encounter: Payer: Self-pay | Admitting: Adult Health

## 2013-07-17 ENCOUNTER — Ambulatory Visit (INDEPENDENT_AMBULATORY_CARE_PROVIDER_SITE_OTHER): Payer: Medicare Other | Admitting: Adult Health

## 2013-07-17 ENCOUNTER — Ambulatory Visit (INDEPENDENT_AMBULATORY_CARE_PROVIDER_SITE_OTHER)
Admission: RE | Admit: 2013-07-17 | Discharge: 2013-07-17 | Disposition: A | Discharge status: Home / Self Care | Payer: Medicare Other | Source: Ambulatory Visit | Attending: Adult Health | Admitting: Adult Health

## 2013-07-17 VITALS — BP 112/70 | HR 97 | Temp 97.7°F | Ht 67.0 in | Wt 114.4 lb

## 2013-07-17 DIAGNOSIS — J441 Chronic obstructive pulmonary disease with (acute) exacerbation: Secondary | ICD-10-CM | POA: Diagnosis not present

## 2013-07-17 DIAGNOSIS — J449 Chronic obstructive pulmonary disease, unspecified: Secondary | ICD-10-CM

## 2013-07-17 NOTE — Patient Instructions (Addendum)
Follow med calendar closely and bring to each visit. We will call. Chest x-ray results. Follow Dr. Sherene Sires in 2-3 months and as needed

## 2013-07-22 ENCOUNTER — Telehealth: Payer: Self-pay | Admitting: Internal Medicine

## 2013-07-22 DIAGNOSIS — J449 Chronic obstructive pulmonary disease, unspecified: Secondary | ICD-10-CM

## 2013-07-22 NOTE — Assessment & Plan Note (Addendum)
Recent flare now resolved  Patient's medications were reviewed today and patient education was given. Computerized medication calendar was adjusted/completed  Plan  follow med calendar closely and bring to each visit. We will call. Chest x-ray results. Follow Dr. Sherene Sires in 2-3 months and as needed May use Albuterol Neb As needed  Wheezing.

## 2013-07-22 NOTE — Progress Notes (Signed)
Subjective:    Patient ID: [name]    DOB: 07/20/1932   MRN: [id number]  HPI  78 yowf active smoker referred 02/08/2012 by [name] for intermittent sob was on 02 but stopped in early 2013    02/08/2012 1st pulmonary ov/ cc "intermittently" bad breathing x 2-3 y  but on  best day can do  [employer] full aisles  but not mall with episodes much worse last starting 3 weeks prior to OV  rx already by abx and prednisone and some better.  Assoc with congested cough slt discolored better p abx but really has year round am cough x years, worse in winter rec Moderate copd/ emphysema (not severe) I reviewed the Flethcher curve    Work on inhaler technique:  Late add:  Needs cxr on return  03/24/2012 f/u ov/Wert cc doe not changed only  using albuterol but 4 x weekly, minimal am cough, mucoid. No active sinus or hb symptoms rec pt wants alternative to spiriva...feet and ankles swelling since beginning treatment 03/24/12 Pt stopped using 05/06/12   05/09/2012 f/u ov/Wert cc leg swelling p first box of spiriva but did feel it helped breathing, swelling is worse on L sltly, better since held the spiriva. 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 No cough, overt sinus or reflux symptoms. rec Try tudorza one twice daily - if you don't see the green turn red it didn't work  Please see patient coordinator before you leave today  to schedule overnight oximetry on room air Please schedule a follow up office visit in 4 weeks, sooner if needed   06/12/2012 f/u ov/Wert still smoking cc sleeping ok on 02 no am wakes up feeling rested no ha.  [place] x > [employer], usually does not use HC parking. No variability or overt sinus or hb symptoms or need for daytime saba.  Not interested in daytime 02 or committed to stop smoking at this point. Likes spiriva > tudorza because not able to trigger the tudorza consistently. rec The key is to stop smoking completely before smoking completely stops you - no  change in meds    08/11/2012 f/u ov/Wert cc missed appt  09/24/2012 f/u ov/Wert still smoking cc doe x ok at [employer] pushing cart rec Work on inhaler technique/ f/u prn   01/05/2013 f/u ov/Wert cc no change in breathing although had aecopd and was provided with nebulizer but no clearly what she's using in it >  back to baseline doe ([employer] shopping)and using hfa twice daily and neb once daily. Has congested cough, still smoking rec Change 02 regimen:  2lpm baseline and walking outside of home  Plan A  Continue Spriva one capsule daily each am and stop smoking Only use your albuterol (plan B proaire, plan C is the nebulizer)  as a rescue medication to be used if you can't catch your breath by resting or doing a relaxed purse lip breathing pattern. The less you use it, the better it will work when you need it. Ok to use up to every 4 hours. For cough/ congestion >  mucinex 600 mg 1-2 every 12 hours as needed Call back with name of neb   05/01/2013 f/u ov/Wert re copd/ most 2lpm 20 h/d  always wears sleeping no longer smoking 60 days Chief Complaint  Patient presents with  . Follow-up    Breathing worse x 1 wk. Out of breath walking from parking lot to our building this morning. Sats 79%  on ra- she has her portable o2 but not sure how to use it. Sats increased to 92%2lpm..   started prednisone 3 days prior to OV  Per S Tysinger, not really better yet, not using spiriva correctly, confused with action plan and still doesn't know names of meds/ different types of albuterol. No change in chronic cough with this flare and really not doing much better since quit smoking  rec Wear 02  2lpm 24 hours per day Start symbicort 160 Take 2 puffs first thing in am and then another 2 puffs about 12 hours later.  Continue spiriva one capsule immediately after your am dose of symbicort Work on inhaler technique.  Admit date: 05/05/2013  Discharge date: 05/11/2013  Admission Diagnoses:  Dyspnea and cough   Discharge Diagnoses:  Active Problems:  COPD GOLD III  Abnormal CXR  Acute-on-chronic respiratory failure    Admitted for refractory cough and dyspnea. On CXR she had what looked like a new cavitary lesion in the LUL. She had sputum for AFBs which were negative, her Quantiferon gold was negative. She had a CT scan to better define the CXR findings which showed mucous plugging rather than new cavitary changes. Treatment was then aimed at continuing antibiotics for AECOPD, pulmonary hygiene and supportive care. She has improved but is not yet at baseline at time of discharge. From a pulmonary standpoint her plan of care will be as follows:  Plan  Oxygen-->2 liters at rest; increase as needed to sat > 90%  Slow pred taper  Changed symbicort to brovana and budesonide.  Resume spiriva  F/u with [place] in our office       05/15/2013 post hosp f/u / Wert on 2lpm/ tapering prednisone off Chief Complaint  Patient presents with  . HFU    Pt states that her breathing is much improved, almost back at baseline.    maint on performist/pulmocort/spiriva still very sedentary.  rec For now stay 02 2lpm 24 h per day but if you want to try off at rest it's ok as long as over 90%  Plan A  Automatic=    Spriva one capsule daily each am  Plus nebulizer budesonide/formoterol twice daily   Only use your albuterol (plan B ventolin, plan C is the nebulizer)  as a rescue medication to be used if you can't catch your breath by resting or doing a relaxed purse lip breathing pattern. The less you use it, the better it will work when you need it. Ok to use up to every 4 hours if needed with goal of not needing it at all. Plan D =  Doctor, call me if needing the nebulized albuterol more as you come off the prednisone For cough/ congestion >  mucinex 600 mg 1-2 every 12 hours as needed plus use flutter valve    06/01/2013 f/u ov/Wert re chronic 02 dep resp/copd/ not smoking Chief Complaint  Patient presents with  .  Follow-up    Pt states breathing is doing well, back at baseline for her. No new co's today.   cough and congestion better in am, using only plan A.  Mucus is clear rec Be sure to use your nebulizer with budesonide and formoterol first thing in am and chase with spiriva, repeat 12 hours later without spiriva   07/09/2013 f/u ov/Wert re severe copd/ 02 dep Chief Complaint  Patient presents with  . Acute Visit    Pt c/o increased cough x 1 wk- prod with minimal clear to  yellow sputum. She also c/o having increased SOB for the past wk.    confused with instructions, access to meds, couldn't even find pepcid otc and worse since ran out of meds.  No def purulent sputum, better p saba.  > prednisone taper   07/17/13 Follow up and med review  new med calendar - pt brought all meds with her today.  reports breathing/cough are unchanged, but mucus is clearing.  finished pred taper.  no new complaints. Seen 2 weeks ago , with COPD flare given a steroid taper  Feeling better. No fever, chest pain or orthpnea.         Current Medications, Allergies, Past Medical History, Past Surgical History, Family History, and Social History were reviewed in [employer] record.  ROS  The following are not active complaints unless bolded sore throat, dysphagia, dental problems, itching, sneezing,  nasal congestion or excess/ purulent secretions, ear ache,   fever, chills, sweats, unintended wt loss, pleuritic or exertional cp, hemoptysis,  orthopnea pnd or leg swelling, presyncope, palpitations, heartburn, abdominal pain, anorexia, nausea, vomiting, diarrhea  or change in bowel or urinary habits, change in stools or urine, dysuria,hematuria,  rash, arthralgias, visual complaints, headache, numbness weakness or ataxia or problems with walking or coordination,  change in mood/affect or memory.              Objective:   Physical Exam   Thin amb wf with hopeless helpless affect  Wt 97  02/08/2012 > 03/24/2012  99 > 103 05/09/2012 > 06/12/2012  99 >   09/24/2012  96 > 98 01/05/2013 >  106 05/01/2013 > 05/05/2013  99 > 05/15/2013  104 > 06/01/2013 115 > 115 07/07/2013 > 114  07/17/13   Congested rattling cough   HEENT mild turbinate edema.  Oropharynx no thrush or excess pnd or cobblestoning.  No JVD or cervical adenopathy. Mild accessory muscle hypertrophy. Trachea midline, nl thryroid. Chest was hyperinflated by percussion with diminished breath sounds and moderate increased exp time without wheeze. Hoover sign positive at mid inspiration. Regular rate and rhythm without murmur gallop or rub or increase P2 edema resolved.  Abd: no hsm, nl excursion. Ext warm without cyanosis or clubbing.    CXR  05/15/2013 :   Streaky densities at both lung bases. Previously seen small areas of patchy bilateral lower lobe air space disease is not as well seen but can be obscured on routine chest radiography  CXR 07/17/13 Hyperexpansion of the lungs consistent with chronic obstructive pulmonary disease. No acute abnormality seen.         Assessment & Plan:  st.

## 2013-07-22 NOTE — Telephone Encounter (Signed)
Result Notes    Notes Recorded by Julio Sicks, NP on 07/21/2013 at 5:57 PM No acute changes  Cont w/ ov recs  Please contact office for sooner follow up if symptoms do not improve or worsen or seek emergency care  ---  I spoke with patient about results and she verbalized understanding and had no questions. She states she needs her albuterol nebulizer medication sent to the [facility] pharmacy [facility]. TP is finishing pt chart and can not print off RX. JJ is aware and will send to her to f/u on as she is in triage this afternoon.

## 2013-07-23 MED ORDER — ALBUTEROL SULFATE (2.5 MG/3ML) 0.083% IN NEBU: 2.5000 mg | INHALATION_SOLUTION | RESPIRATORY_TRACT | Status: DC | PRN

## 2013-07-23 NOTE — Telephone Encounter (Signed)
Rx printed, signed by TP and placed in [facility] lookat Order to [facility] for refill Nothing further needed; will sign off.

## 2013-07-24 ENCOUNTER — Telehealth: Payer: Self-pay | Admitting: Internal Medicine

## 2013-07-24 DIAGNOSIS — J449 Chronic obstructive pulmonary disease, unspecified: Secondary | ICD-10-CM

## 2013-07-24 MED ORDER — ALBUTEROL SULFATE (2.5 MG/3ML) 0.083% IN NEBU: 2.5000 mg | INHALATION_SOLUTION | Freq: Every day | RESPIRATORY_TRACT | Status: DC

## 2013-07-24 NOTE — Telephone Encounter (Signed)
I spoke with [name]. She stated since pt is on perforomists medicare will only cover the allbuterol neb to be taking once daily ONLY. Per Efraim Kaufmann this is medicare guidelines. I have printed this off, TP signed and faxed over to them. Will sign off message

## 2013-08-04 ENCOUNTER — Telehealth: Payer: Self-pay | Admitting: Internal Medicine

## 2013-08-04 MED ORDER — AZITHROMYCIN 250 MG PO TABS: ORAL_TABLET | ORAL | Status: DC

## 2013-08-04 NOTE — Telephone Encounter (Signed)
Spoke with the pt  She states coughing up moderate amounts green to yellow sputum x 2 days She denies any increased SOB, CP, chest tightness, f/c/s She would like something called in, was recently seen for med cal 07/17/13 and f/u pending for 09/18/13 with MW Please advise, thanks! Allergies  Allergen Reactions  . Levofloxacin Other (See Comments)    Lowered blood pressre

## 2013-08-04 NOTE — Telephone Encounter (Signed)
zpak and ok to make this a prn with 5 refills for discolored sputum

## 2013-08-06 ENCOUNTER — Telehealth: Payer: Self-pay | Admitting: Internal Medicine

## 2013-08-06 MED ORDER — FORMOTEROL FUMARATE 20 MCG/2ML IN NEBU: 20.0000 ug | INHALATION_SOLUTION | Freq: Two times a day (BID) | RESPIRATORY_TRACT | Status: DC

## 2013-08-06 NOTE — Telephone Encounter (Signed)
Spoke with patient-- patient states her performist and budesonide nebs will not be mailed out until tomorrow and will take up to 2 business days to get to her Patient says she has enough performist to last today tomorrow and 1 on Saturday, has enough budesonide for today, tomorrow and 4 extra left over Says she usually takes performist and budesonide together and she is unsure if she can take just the budesonide alone once performist runs out Or is there another option for her? Please advise Dr. Sherene Sires, thank you!

## 2013-08-06 NOTE — Telephone Encounter (Signed)
Pt aware. Asked for some samples of performist(12 samples placed up front to last her through the weekend, should get shipment Monday)--d/t Pharmacy company, they will only refill neb meds within a certain time frame and if she uses more of her meds than required, they will not feel early they will make her wait; that's what is happening with her Performist--she ran out sooner than the refill was eligible to be filled so they are making her wait and she is completely out--this would happen with her albuterol as well if she took more of it to cover being out of Performist.  Will let us know tomorrow if she needs a few days worth called into local pharmacy.  Pt feels that she has enough to last her until

## 2013-08-06 NOTE — Telephone Encounter (Signed)
The perfmorist is just long acting albuterol so ok to use more albuterol while waiting for her ship(ment) to come in

## 2013-08-07 ENCOUNTER — Telehealth: Payer: Self-pay | Admitting: Internal Medicine

## 2013-08-07 MED ORDER — FORMOTEROL FUMARATE 20 MCG/2ML IN NEBU: 20.0000 ug | INHALATION_SOLUTION | Freq: Two times a day (BID) | RESPIRATORY_TRACT | Status: DC

## 2013-08-07 MED ORDER — BUDESONIDE 0.25 MG/2ML IN SUSP: 0.2500 mg | Freq: Two times a day (BID) | RESPIRATORY_TRACT | Status: DC

## 2013-08-07 NOTE — Telephone Encounter (Signed)
Called [facility] - gave dx code: 496. Nothing further was needed.

## 2013-08-07 NOTE — Telephone Encounter (Signed)
Pt requested a 30 day supply to be sent to [facility] of each medication. This has been taken care of. Nothing further was needed.

## 2013-08-11 ENCOUNTER — Telehealth: Payer: Self-pay | Admitting: Internal Medicine

## 2013-08-11 NOTE — Telephone Encounter (Signed)
Pt's daughter is aware of MW recs.

## 2013-08-11 NOTE — Telephone Encounter (Signed)
I spoke with daughter. She stated the best way to clean the carpet she found on the Internet and stated steam cleaning. Since pt has COPD she wants MW recs on this. When we call back she request we leave detailed message on her VM. Please advise MW thanks

## 2013-08-11 NOTE — Telephone Encounter (Signed)
That's fine but ideally shouldn't be in the part of the house being cleaned and should dry it thoroughly with fans

## 2013-08-11 NOTE — Telephone Encounter (Signed)
LMTCB x 1 

## 2013-08-11 NOTE — Telephone Encounter (Signed)
Pt's daughter returned triage's call.  Holly D Pryor ° °

## 2013-09-07 ENCOUNTER — Other Ambulatory Visit: Payer: Self-pay | Admitting: Internal Medicine

## 2013-09-07 DIAGNOSIS — Z23 Encounter for immunization: Secondary | ICD-10-CM | POA: Diagnosis not present

## 2013-09-18 ENCOUNTER — Encounter: Payer: Self-pay | Admitting: Internal Medicine

## 2013-09-18 ENCOUNTER — Ambulatory Visit (INDEPENDENT_AMBULATORY_CARE_PROVIDER_SITE_OTHER): Payer: Medicare Other | Admitting: Internal Medicine

## 2013-09-18 VITALS — BP 114/80 | HR 75 | Temp 98.1°F | Ht 67.5 in | Wt 127.0 lb

## 2013-09-18 DIAGNOSIS — J449 Chronic obstructive pulmonary disease, unspecified: Secondary | ICD-10-CM | POA: Diagnosis not present

## 2013-09-18 DIAGNOSIS — J9610 Chronic respiratory failure, unspecified whether with hypoxia or hypercapnia: Secondary | ICD-10-CM | POA: Diagnosis not present

## 2013-09-18 NOTE — Progress Notes (Signed)
Subjective:    Patient ID: [name]    DOB: 1932/06/05   MRN: [id number]    Brief patient profile:  61 yowf active smoker referred 02/08/2012 by [name] for intermittent sob was on 02 but stopped in early 2013 and stopped smoking completely  03/05/13 with GOLD III COPD documented 03/2012  History of Present Illness  02/08/2012 1st pulmonary ov/ cc "intermittently" bad breathing x 2-3 y  but on  best day can do  [employer] full aisles  but not mall with episodes much worse last starting 3 weeks prior to OV  rx already by abx and prednisone and some better.  Assoc with congested cough slt discolored better p abx but really has year round am cough x years, worse in winter rec Moderate copd/ emphysema (not severe) I reviewed the Flethcher curve    Work on inhaler technique:      08/11/2012 f/u ov/Honore Wipperfurth cc missed appt     05/01/2013 f/u ov/Tavius Turgeon re copd/ most 2lpm 20 h/d  always wears sleeping no longer smoking 60 days Chief Complaint  Patient presents with  . Follow-up    Breathing worse x 1 wk. Out of breath walking from parking lot to our building this morning. Sats 79% on ra- she has her portable o2 but not sure how to use it. Sats increased to 92%2lpm..   started prednisone 3 days prior to OV  Per S Tysinger, not really better yet, not using spiriva correctly, confused with action plan and still doesn't know names of meds/ different types of albuterol. No change in chronic cough with this flare and really not doing much better since quit smoking  rec Wear 02  2lpm 24 hours per day Start symbicort 160 Take 2 puffs first thing in am and then another 2 puffs about 12 hours later.  Continue spiriva one capsule immediately after your am dose of symbicort Work on inhaler technique.  Admit date: 05/05/2013  Discharge date: 05/11/2013  Admission Diagnoses:  Dyspnea and cough  Discharge Diagnoses:  Active Problems:  COPD GOLD III  Abnormal CXR  Acute-on-chronic respiratory failure     Admitted for refractory cough and dyspnea. On CXR she had what looked like a new cavitary lesion in the LUL. She had sputum for AFBs which were negative, her Quantiferon gold was negative. She had a CT scan to better define the CXR findings which showed mucous plugging rather than new cavitary changes. Treatment was then aimed at continuing antibiotics for AECOPD, pulmonary hygiene and supportive care. She has improved but is not yet at baseline at time of discharge. From a pulmonary standpoint her plan of care will be as follows:  Plan  Oxygen-->2 liters at rest; increase as needed to sat > 90%  Slow pred taper  Changed symbicort to brovana and budesonide.  Resume spiriva  F/u with [place] in our office       05/15/2013 post hosp f/u / Iana Buzan on 2lpm/ tapering prednisone off Chief Complaint  Patient presents with  . HFU    Pt states that her breathing is much improved, almost back at baseline.    maint on performist/pulmocort/spiriva still very sedentary.  rec For now stay 02 2lpm 24 h per day but if you want to try off at rest it's ok as long as over 90%  Plan A  Automatic=    Spriva one capsule daily each am  Plus nebulizer budesonide/formoterol twice daily   Only use your albuterol (plan B ventolin, plan  C is the nebulizer)  as a rescue medication to be used if you can't catch your breath by resting or doing a relaxed purse lip breathing pattern. The less you use it, the better it will work when you need it. Ok to use up to every 4 hours if needed with goal of not needing it at all. Plan D =  Doctor, call me if needing the nebulized albuterol more as you come off the prednisone For cough/ congestion >  mucinex 600 mg 1-2 every 12 hours as needed plus use flutter valve    06/01/2013 f/u ov/Morton Simson re chronic 02 dep resp/copd/ not smoking Chief Complaint  Patient presents with  . Follow-up    Pt states breathing is doing well, back at baseline for her. No new co's today.   cough and  congestion better in am, using only plan A.  Mucus is clear rec Be sure to use your nebulizer with budesonide and formoterol first thing in am and chase with spiriva, repeat 12 hours later without spiriva   07/09/2013 f/u ov/Cassandr Cederberg re severe copd/ 02 dep Chief Complaint  Patient presents with  . Acute Visit    Pt c/o increased cough x 1 wk- prod with minimal clear to yellow sputum. She also c/o having increased SOB for the past wk.    confused with instructions, access to meds, couldn't even find pepcid otc and worse since ran out of meds.  No def purulent sputum, better p saba.  > prednisone taper   07/17/13 Follow up and med review  new med calendar - pt brought all meds with her today.  reports breathing/cough are unchanged, but mucus is clearing.  finished pred taper.  no new complaints. Seen 2 weeks ago , with COPD flare given a steroid taper  Feeling better. No fever, chest pain or orthpnea.  rec No change rx, use med cal  09/18/2013 f/u ov/Khalise Billard re: copd/ brought med calendar  Chief Complaint  Patient presents with  . Follow-up    Pt states that her breathing is doing well today, and she denies any new co's.   Getting up and down aisles at grocery store on 02 2 lpm and no pred and no sign saba use.  No obvious day to day or daytime variabilty or assoc chronic cough or cp or chest tightness, subjective wheeze overt sinus or hb symptoms. No unusual exp hx or h/o childhood pna/ asthma or knowledge of premature birth.  Sleeping ok without nocturnal  or early am exacerbation  of respiratory  c/o's or need for noct saba. Also denies any obvious fluctuation of symptoms with weather or environmental changes or other aggravating or alleviating factors except as outlined above   Current Medications, Allergies, Complete Past Medical History, Past Surgical History, Family History, and Social History were reviewed in [employer] record.  ROS  The following are not active  complaints unless bolded sore throat, dysphagia, dental problems, itching, sneezing,  nasal congestion or excess/ purulent secretions, ear ache,   fever, chills, sweats, unintended wt loss, pleuritic or exertional cp, hemoptysis,  orthopnea pnd or leg swelling, presyncope, palpitations, heartburn, abdominal pain, anorexia, nausea, vomiting, diarrhea  or change in bowel or urinary habits, change in stools or urine, dysuria,hematuria,  rash, arthralgias, visual complaints, headache, numbness weakness or ataxia or problems with walking or coordination,  change in mood/affect or memory.              Objective:   Physical Exam  Thin amb wf with much more pos attitude   Wt 97 02/08/2012 > 03/24/2012  99 > 103 05/09/2012 > 06/12/2012  99 >   09/24/2012  96 > 98 01/05/2013 >  106 05/01/2013 > 05/05/2013  99 > 05/15/2013  104 > 06/01/2013 115 > 115 07/07/2013 > 114  07/17/13  > 127 09/18/2013   Congested rattling cough   HEENT mild turbinate edema.  Oropharynx no thrush or excess pnd or cobblestoning.  No JVD or cervical adenopathy. Mild accessory muscle hypertrophy. Trachea midline, nl thryroid. Chest was hyperinflated by percussion with diminished breath sounds and moderate increased exp time without wheeze. Hoover sign positive at mid inspiration. Regular rate and rhythm without murmur gallop or rub or increase P2 edema resolved.  Abd: no hsm, nl excursion. Ext warm without cyanosis or clubbing.    CXR  05/15/2013 :   Streaky densities at both lung bases. Previously seen small areas of patchy bilateral lower lobe air space disease is not as well seen but can be obscured on routine chest radiography  CXR 07/17/13 Hyperexpansion of the lungs consistent with chronic obstructive pulmonary disease. No acute abnormality seen.         Assessment & Plan:

## 2013-09-18 NOTE — Patient Instructions (Addendum)

## 2013-09-19 ENCOUNTER — Encounter: Payer: Self-pay | Admitting: Internal Medicine

## 2013-09-19 NOTE — Assessment & Plan Note (Signed)
-   ono RA 05/14/12   4 h 33 min < 89%   So ordered 02 2lpm    - HCO3  28  05/27/12   - 01/05/2013   Walked RA x one lap @ 185 stopped due to  88%   - 01/05/2013  Walked 2lpm 2 laps @ 185 ft each stopped due to  90% sob but improved vs baseline   - RA 05/01/2013 sats = 87%    Rx= 02 2lpm 24/7  As of 07/07/13  Adequate control on present rx, reviewed > no change in rx needed

## 2013-09-19 NOTE — Assessment & Plan Note (Signed)
-   PFTs 03/24/2012  FEV1  0.91 (43%) ratio 48 and no better p B2,  DLCO 38% corrects to 53%  - HFA 75% 06/01/2013   - changed to perforomist/bud bid 04/2013  - med calendar 07/17/13  Marked symptomatic improvement sp smoking cessation x 6 months,  Very encouraging    Each maintenance medication was reviewed in detail including most importantly the difference between maintenance and as needed and under what circumstances the prns are to be used. This was done in the context of a medication calendar review which provided the patient with a user-friendly unambiguous mechanism for medication administration and reconciliation and provides an action plan for all active problems. It is critical that this be shown to every doctor  for modification during the office visit if necessary so the patient can use it as a working document.

## 2013-09-28 ENCOUNTER — Telehealth: Payer: Self-pay | Admitting: *Deleted

## 2013-09-28 ENCOUNTER — Telehealth: Payer: Self-pay | Admitting: Internal Medicine

## 2013-09-28 DIAGNOSIS — J449 Chronic obstructive pulmonary disease, unspecified: Secondary | ICD-10-CM

## 2013-09-28 NOTE — Telephone Encounter (Signed)
Fine with me

## 2013-09-28 NOTE — Telephone Encounter (Signed)
I spoke with pt and she is wanting to know if we have sent in any order for helios. She reports she has the big tanks that she lugs around. Dr. Sherene Sires please advise if okay to send order thanks

## 2013-09-28 NOTE — Telephone Encounter (Signed)
Pt aware and order sent to [facility].

## 2013-09-28 NOTE — Telephone Encounter (Signed)
Message copied by Tommie Sams on Mon Sep 28, 2013  4:19 PM ------      Message from: Henderson Newcomer      Created: Mon Sep 28, 2013  4:11 PM       Hey Mindy.  This order is for Helios which is liquid O2.  This pt does not qualify for our liquid program.  I can provide you with a copy of our guidelines if you need them.  Ms. Uphoff already has the lightest tanks that we carry.        Please let me know if you have questions.      ----- Message -----         From: Tommie Sams, CMA         Sent: 09/28/2013   2:52 PM           To: Melissa Stenson            Order placed on pt thanks       ------

## 2013-09-28 NOTE — Telephone Encounter (Signed)
i called and made pt aware. Nothing further needed 

## 2013-10-03 ENCOUNTER — Other Ambulatory Visit: Payer: Self-pay | Admitting: Internal Medicine

## 2013-10-06 ENCOUNTER — Telehealth: Payer: Self-pay | Admitting: Internal Medicine

## 2013-10-06 NOTE — Telephone Encounter (Signed)
[employer], spoke with Hunter.  Was advised the dx code, 496 - copd, is not going through on Medicare Part B because this wasn't the original dx that is was approved under.  Per Lurena Joiner, we need to call [phone] to straighten this out.  I also had Lurena Joiner run other pulm dx in pt's chart to see if they would go through, but none would.  I called # provided above by Lurena Joiner.  Spoke with Selena Batten.  Was advised their computer system is down, and we would need to call back in approx 1 hour.  Will hold msg in triage TCB.

## 2013-10-06 NOTE — Telephone Encounter (Signed)
See other phone note from 10-06-14. Pt aware we are working on this. Carron Curie, CMA

## 2013-10-07 NOTE — Telephone Encounter (Signed)
Called the number for [facility] [phone]) and spoke with Marisue Ivan who stated that the CMN was sent to them on 8.22.14 with a non-qualifying dx of 518.84 (acute on chronic resp failure) under Medicare part B.  Per Marisue Ivan, COPD is a qualifying dx but a new case will need to be opened to resubmit.  Marisue Ivan will refax the CMN for this dx to obtain a signature.  Will route to [place] to follow up on CMN.

## 2013-10-07 NOTE — Telephone Encounter (Signed)
Alida, have you seen this CMN? Please advise

## 2013-10-08 NOTE — Telephone Encounter (Signed)
Pt is requesting alternatives (alternate Rx or samples) of budesonide & perforomist while awaiting for the CMN approval.  Please call.  Antionette Fairy

## 2013-10-08 NOTE — Telephone Encounter (Signed)
Alida does not have this either so I called and asked that it be refaxed to up front fax. Carron Curie, CMA

## 2013-10-08 NOTE — Telephone Encounter (Signed)
Please advise if this has been received alida? thanks

## 2013-10-09 MED ORDER — FORMOTEROL FUMARATE 20 MCG/2ML IN NEBU: INHALATION_SOLUTION | RESPIRATORY_TRACT | Status: DC

## 2013-10-09 MED ORDER — BUDESONIDE 0.25 MG/2ML IN SUSP: 0.2500 mg | Freq: Every day | RESPIRATORY_TRACT | Status: DC

## 2013-10-09 NOTE — Telephone Encounter (Signed)
I got the form and had it signed and faxed to [facility]. I then called [phone] to make sure they received it and to see how long the process would take to get the pt her meds since she has been out. I was advised that it takes 3 hours for it to even show in the system. I was advised that I could fax it to a local [facility] and they could call it in for a faster processing. I called local [facility] and spoke with Rob and he advised to fax it to them as well so they can do this. I advised him that the pt is out of meds and this needs to be processed asap. Copy placed in scan folder.   I did find samples of meds for the pt so I left them at front. Pt is aware of samples. Carron Curie, CMA

## 2013-10-15 ENCOUNTER — Telehealth: Payer: Self-pay | Admitting: Internal Medicine

## 2013-10-15 DIAGNOSIS — J449 Chronic obstructive pulmonary disease, unspecified: Secondary | ICD-10-CM

## 2013-10-15 MED ORDER — FORMOTEROL FUMARATE 20 MCG/2ML IN NEBU: INHALATION_SOLUTION | RESPIRATORY_TRACT | Status: DC

## 2013-10-15 NOTE — Telephone Encounter (Signed)
PCC's do you guys know how much this costs through [facility]? thanks

## 2013-10-15 NOTE — Telephone Encounter (Signed)
Order placed to have [facility] supply pt with neb meds. Perforomist rx printed.  It has been signed by Dr. Sherene Sires and given to [facility].   Pt aware.

## 2013-10-15 NOTE — Telephone Encounter (Signed)
Called and spoke with [name] at [facility]. With the patient's two insurance's, pt should not owe anything for this medication. Called and explained this to the patient, and advised her of the above and stated that [facility] would contact her once they received the order and Rx and would run this through the insurance and contact her to go over the cost, if any, and delivery of medication. Pt requested that Rx be faxed to [facility]. Please place referral and Rx for [facility] to supply nebulizer medication and send to [facility]. [name]

## 2013-10-15 NOTE — Telephone Encounter (Signed)
She should be getting this through her dme company Part B medicare which covers 80% of that cost - send her to [place] if she has any questions about how to get this done

## 2013-10-15 NOTE — Telephone Encounter (Signed)
I spoke with pt. She reports the perforomists is not covered by her insurance. It was going to cost her $800. She is wanting to know if there is an alternative? Please advise MW thanks

## 2013-10-19 ENCOUNTER — Telehealth: Payer: Self-pay | Admitting: Internal Medicine

## 2013-10-19 NOTE — Telephone Encounter (Signed)
Forms placed in MW lookat and will mail once singed

## 2013-10-21 ENCOUNTER — Telehealth: Payer: Self-pay | Admitting: Internal Medicine

## 2013-10-21 NOTE — Telephone Encounter (Signed)
Pt states Perforomist is not covered by insurance and is going to cost her $870+ out of pocket.  Would like to know what other nebs she can try similar that may be covered.   Samples have been placed up front to last her until we can get something approved.   Please advise Dr Sherene Sires. Thanks.

## 2013-10-21 NOTE — Telephone Encounter (Signed)
We have already sent an order to [facility] for neb meds so I called them and spoke with kim because the amount did not see correct. Kim states no they did not tell teh pt this amount. She also thinks thather meds have already been shipped. She will call the pharmacy to check on shipment and then will call the pt.   I called the pt and asked who told her that it would cost this much and she [facility]. I advised she is not getting her [facility]s [facility] they are coming through a DME. I advised her that Selena Batten will contact her to let her know about the shipment date, but to not worry because it will not cost that. Pt states understanding. Nothing further needed. Carron Curie, CMA

## 2013-10-21 NOTE — Telephone Encounter (Signed)
This needs to go through her dme company part B medicare not part D - ask Bjorn Loser for help with this if needed

## 2013-10-28 ENCOUNTER — Telehealth: Payer: Self-pay | Admitting: Internal Medicine

## 2013-10-28 MED ORDER — BUDESONIDE 0.25 MG/2ML IN SUSP: 0.2500 mg | Freq: Two times a day (BID) | RESPIRATORY_TRACT | Status: DC

## 2013-10-28 MED ORDER — BUDESONIDE 0.25 MG/2ML IN SUSP: 0.2500 mg | Freq: Every day | RESPIRATORY_TRACT | Status: DC

## 2013-10-28 NOTE — Telephone Encounter (Signed)
Called, spoke with pt.  Pt reports she didn't call.  States she spoke with [facility] yesterday who told her they were going to call our office today for something.    Called [facility], spoke with Selena Batten.  She did call on pt.  Reports pt would like to start getting budesonide neb through them as she is already getting Perforomist through [facility].    Called, spoke with pt.  Verified she does want to get the budesonide through [facility].  Pt reports she is taking the budesonide 0.25 bid.  However, pt's med list states it is qd.  Dr. Sherene Sires, pls advise on the frequency pt should be using the budesonide so we can send in rx.  Thank you.  [facility] fax # is [phone]

## 2013-10-28 NOTE — Telephone Encounter (Signed)
RX printed and placed in MW LOOK AT. To be faxed to [facility].

## 2013-10-28 NOTE — Telephone Encounter (Signed)
Budesonide 0.25 bid is what I thought she was on and is the correct dose

## 2013-10-28 NOTE — Telephone Encounter (Signed)
Should the pt be taking the budesonide once daily of twice daily? Carron Curie, CMA

## 2013-10-28 NOTE — Telephone Encounter (Signed)
Fine with me > same rx

## 2013-10-28 NOTE — Telephone Encounter (Signed)
Budesonide rx signed by MW and was faxed to [facility] by Meghan.

## 2013-11-03 ENCOUNTER — Other Ambulatory Visit: Payer: Self-pay | Admitting: Dermatology

## 2013-11-03 DIAGNOSIS — D0439 Carcinoma in situ of skin of other parts of face: Secondary | ICD-10-CM | POA: Diagnosis not present

## 2013-11-03 DIAGNOSIS — L738 Other specified follicular disorders: Secondary | ICD-10-CM | POA: Diagnosis not present

## 2013-11-03 DIAGNOSIS — L821 Other seborrheic keratosis: Secondary | ICD-10-CM | POA: Diagnosis not present

## 2013-12-01 ENCOUNTER — Encounter: Payer: Self-pay | Admitting: Internal Medicine

## 2013-12-01 ENCOUNTER — Ambulatory Visit (INDEPENDENT_AMBULATORY_CARE_PROVIDER_SITE_OTHER): Payer: Medicare Other | Admitting: Internal Medicine

## 2013-12-01 VITALS — BP 128/80 | HR 93 | Temp 98.2°F | Ht 67.0 in | Wt 131.0 lb

## 2013-12-01 DIAGNOSIS — J449 Chronic obstructive pulmonary disease, unspecified: Secondary | ICD-10-CM

## 2013-12-01 DIAGNOSIS — J9610 Chronic respiratory failure, unspecified whether with hypoxia or hypercapnia: Secondary | ICD-10-CM

## 2013-12-01 NOTE — Progress Notes (Signed)
Subjective:    Patient ID: [name]    DOB: 09/01/1932   MRN: [id number]    Brief patient profile:  81 yowf quit smoking 02/2013  referred 02/08/2012 by [name] for intermittent sob was on 02 but stopped in early 2013 and with GOLD III COPD documented 03/2012  History of Present Illness  02/08/2012 1st pulmonary ov/ cc "intermittently" bad breathing x 2-3 y  but on  best day can do  [employer] full [place]isles  but not mall with episodes much worse last starting 3 weeks prior to OV  rx already by abx and prednisone and some better.  Assoc with congested cough slt discolored better p abx but really has year round am cough x years, worse in winter rec Moderate copd/ emphysema (not severe) I reviewed the Flethcher curve re stopping smoking asap Work on inhaler technique:      Admit date: 05/05/2013  Discharge date: 05/11/2013  Admission Diagnoses:  Dyspnea and cough  Discharge Diagnoses:  Active Problems:  COPD GOLD III  Abnormal CXR  Acute-on-chronic respiratory failure    Admitted for refractory cough and dyspnea. On CXR she had what looked like a new cavitary lesion in the LUL. She had sputum for AFBs which were negative, her Quantiferon gold was negative. She had a CT scan to better define the CXR findings which showed mucous plugging rather than new cavitary changes. Treatment was then aimed at continuing antibiotics for AECOPD, pulmonary hygiene and supportive care. She has improved but is not yet at baseline at time of discharge. From a pulmonary standpoint her plan of care will be as follows:  Plan  Oxygen-->2 liters at rest; increase as needed to sat > 90%  Slow pred taper  Changed symbicort to brovana and budesonide.  Resume spiriva  F/u with [place] in our office       05/15/2013 post hosp f/u / [name] on 2lpm/ tapering prednisone off Chief Complaint  Patient presents with  . HFU    Pt states that her breathing is much improved, almost back at baseline.    maint on  performist/pulmocort/spiriva still very sedentary.  rec For now stay 02 2lpm 24 h per day but if you want to try off at rest it's ok as long as over 90% Plan A  Automatic=    Spriva one capsule daily each am  Plus nebulizer budesonide/formoterol twice daily  Only use your albuterol (plan B ventolin, plan C is the nebulizer)  as a rescue medication to be used if you can't catch your breath by resting or doing a relaxed purse lip breathing pattern. The less you use it, the better it will work when you need it. Ok to use up to every 4 hours if needed with goal of not needing it at all. Plan D =  Doctor, call me if needing the nebulized albuterol more as you come off the prednisone For cough/ congestion >  mucinex 600 mg 1-2 every 12 hours as needed plus use flutter valve        07/09/2013 f/u ov/[name] re severe copd/ 02 dep Chief Complaint  Patient presents with  . Acute Visit    Pt c/o increased cough x 1 wk- prod with minimal clear to yellow sputum. She also c/o having increased SOB for the past wk.    confused with instructions, access to meds, couldn't even find pepcid otc and worse since ran out of meds.  No def purulent sputum, better p saba.  >  prednisone taper      09/18/2013 f/u ov/[name] re: copd/ brought med calendar  Chief Complaint  Patient presents with  . Follow-up    Pt states that her breathing is doing well today, and she denies any new co's.  Getting up and down aisles at grocery store on 02 2 lpm and no pred and no sign saba use. rec No change rx, follow med calendar   12/01/2013 f/u ov/[name] re: GOLD III/ 02 2 lpm 24/7  Chief Complaint  Patient presents with  . Follow-up    Pt states that her breathing is doing well. She has had increased cough for the past 3 days- prod with very minimal clear sputum. She states has not needed albuterol at all recently.      No obvious day to day or daytime variabilty or assoc chronic cough or cp or chest tightness, subjective wheeze  overt sinus or hb symptoms. No unusual exp hx or h/o childhood pna/ asthma or knowledge of premature birth.  Sleeping ok without nocturnal  or early am exacerbation  of respiratory  c/o's or need for noct saba. Also denies any obvious fluctuation of symptoms with weather or environmental changes or other aggravating or alleviating factors except as outlined above   Current Medications, Allergies, Complete Past Medical History, Past Surgical History, Family History, and Social History were reviewed in [employer] record.  ROS  The following are not active complaints unless bolded sore throat, dysphagia, dental problems, itching, sneezing,  nasal congestion or excess/ purulent secretions, ear ache,   fever, chills, sweats, unintended wt loss, pleuritic or exertional cp, hemoptysis,  orthopnea pnd or leg swelling, presyncope, palpitations, heartburn, abdominal pain, anorexia, nausea, vomiting, diarrhea  or change in bowel or urinary habits, change in stools or urine, dysuria,hematuria,  rash, arthralgias, visual complaints, headache, numbness weakness or ataxia or problems with walking or coordination,  change in mood/affect or memory.              Objective:   Physical Exam   Thin amb wf nad on 02   Wt 97 02/08/2012 > 03/24/2012  99 > 103 05/09/2012 > 06/12/2012  99 >   09/24/2012  96 > 98 01/05/2013 >  106 05/01/2013 > 05/05/2013  99 > 05/15/2013  104 > 06/01/2013 115 > 115 07/07/2013 > 114  07/17/13  > 127 09/18/2013 > 12/01/2013  131   Congested rattling cough   HEENT mild turbinate edema.  Oropharynx no thrush or excess pnd or cobblestoning.  No JVD or cervical adenopathy. Mild accessory muscle hypertrophy. Trachea midline, nl thryroid. Chest was hyperinflated by percussion with diminished breath sounds and moderate increased exp time without wheeze. Hoover sign positive at mid inspiration. Regular rate and rhythm without murmur gallop or rub or increase P2 edema resolved.  Abd: no  hsm, nl excursion. Ext warm without cyanosis or clubbing.    CXR  05/15/2013 :   Streaky densities at both lung bases. Previously seen small areas of patchy bilateral lower lobe air space disease is not as well seen but can be obscured on routine chest radiography  CXR 07/17/13 Hyperexpansion of the lungs consistent with chronic obstructive pulmonary disease. No acute abnormality seen.         Assessment & Plan:

## 2013-12-01 NOTE — Assessment & Plan Note (Signed)
-   PFTs 03/24/2012  FEV1  0.91 (43%) ratio 48 and no better p B2,  DLCO 38% corrects to 53%  - HFA 75% 06/01/2013   - changed to perforomist/bud bid 04/2013  - med calendar 07/17/13    Overall much improved though needs help with following action plan at bottom of med calendar  I emphasized to the patient that just like Dorothy in [employer],  She is already wearing "ruby slippers" she can click anytime she wants:  the answer to all her recurrent symptoms  is already  literally at her fingertips:   all she has to do is refer to the medicine calendar we provided her  and her problems  are each addressed in a user-friendly format, reading from left to right,  for each symptoms she's likely to develop between office visits.    Added for purulent sputum to start zpak to bottom of action plan

## 2013-12-01 NOTE — Assessment & Plan Note (Signed)
-   ono RA 05/14/12   4 h 33 min < 89%   So ordered 02 2lpm    - HCO3  28  05/27/12   - 01/05/2013   Walked RA x one lap @ 185 stopped due to  88%   - 01/05/2013  Walked 2lpm 2 laps @ 185 ft each stopped due to  90% sob but improved vs baseline   - RA 05/01/2013 sats = 87%    Rx= 02 2lpm 24/7  As of 07/07/13

## 2013-12-01 NOTE — Patient Instructions (Addendum)
See calendar for specific medication instructions and bring it back for each and every office visit for every healthcare provider you see.  Without it,  you may not receive the best quality medical care that we feel you deserve.  You will note that the calendar groups together  your maintenance  medications that are timed at particular times of the day.  Think of this as your checklist for what your doctor has instructed you to do until your next evaluation to see what benefit  there is  to staying on a consistent group of medications intended to keep you well.  The other group at the bottom is entirely up to you to use as you see fit  for specific symptoms that may arise between visits that require you to treat them on an as needed basis.  Think of this as your action plan or "what if" list (your ruby slippers!!!)  Separating the top medications from the bottom group is fundamental to providing you adequate care going forward.    Please schedule a follow up visit in 3 months but call sooner if needed to See Tammy NP

## 2013-12-14 ENCOUNTER — Telehealth: Payer: Self-pay | Admitting: Internal Medicine

## 2013-12-14 MED ORDER — BUDESONIDE 0.25 MG/2ML IN SUSP: 0.2500 mg | Freq: Two times a day (BID) | RESPIRATORY_TRACT | Status: DC

## 2013-12-14 NOTE — Telephone Encounter (Signed)
Rx was reprinted for MW to sign. Almyra Free will fax this rx to Selena Batten at [facility].

## 2013-12-14 NOTE — Telephone Encounter (Signed)
atc na

## 2013-12-14 NOTE — Telephone Encounter (Signed)
Patient returning call.

## 2013-12-14 NOTE — Telephone Encounter (Signed)
lmtcb x1 We do not have samples of Pulmicort nebulizer.

## 2013-12-15 NOTE — Telephone Encounter (Signed)
Patient returning call.

## 2013-12-15 NOTE — Telephone Encounter (Signed)
I spoke with pt.I advised her we did not have ay samples. She reports she will see if she receives this tomorrow then. She is aware she has her albuterol and performists to use as directed. Nothing further needed

## 2013-12-21 DIAGNOSIS — H4011X0 Primary open-angle glaucoma, stage unspecified: Secondary | ICD-10-CM | POA: Diagnosis not present

## 2013-12-21 DIAGNOSIS — H409 Unspecified glaucoma: Secondary | ICD-10-CM | POA: Diagnosis not present

## 2014-02-01 ENCOUNTER — Telehealth: Payer: Self-pay | Admitting: Internal Medicine

## 2014-02-01 NOTE — Telephone Encounter (Signed)
Pt is aware that we do not have samples. Advised her to continue her other medications. She gets this medication through [facility].

## 2014-02-26 ENCOUNTER — Telehealth: Payer: Self-pay | Admitting: Internal Medicine

## 2014-02-26 MED ORDER — AZITHROMYCIN 250 MG PO TABS: ORAL_TABLET | ORAL | Status: AC

## 2014-02-26 NOTE — Telephone Encounter (Signed)
Spoke with pt. She is wanting a refill on Zpack. Reports cough, SOB and wheezing. Mucus is yellow in color. Onset was 1 week ago. Had these same symptoms last month. Has tried Mucinex with little relief.  MW - please advise. Thanks.

## 2014-02-26 NOTE — Telephone Encounter (Signed)
Ok for z pak.

## 2014-02-26 NOTE — Telephone Encounter (Signed)
Rx has been sent in per MW. Pt is aware. Nothing further is needed.

## 2014-03-02 IMAGING — CR DG CHEST 2V
2 series · 2 of 2 positions shown · non-contrast
Comparison: 05/10/2013 and CT chest 05/06/2013.

CLINICAL DATA: COPD and emphysema with cough and shortness of
breath.

CHEST - 2 VIEW

[view not recorded (1 of 2)]
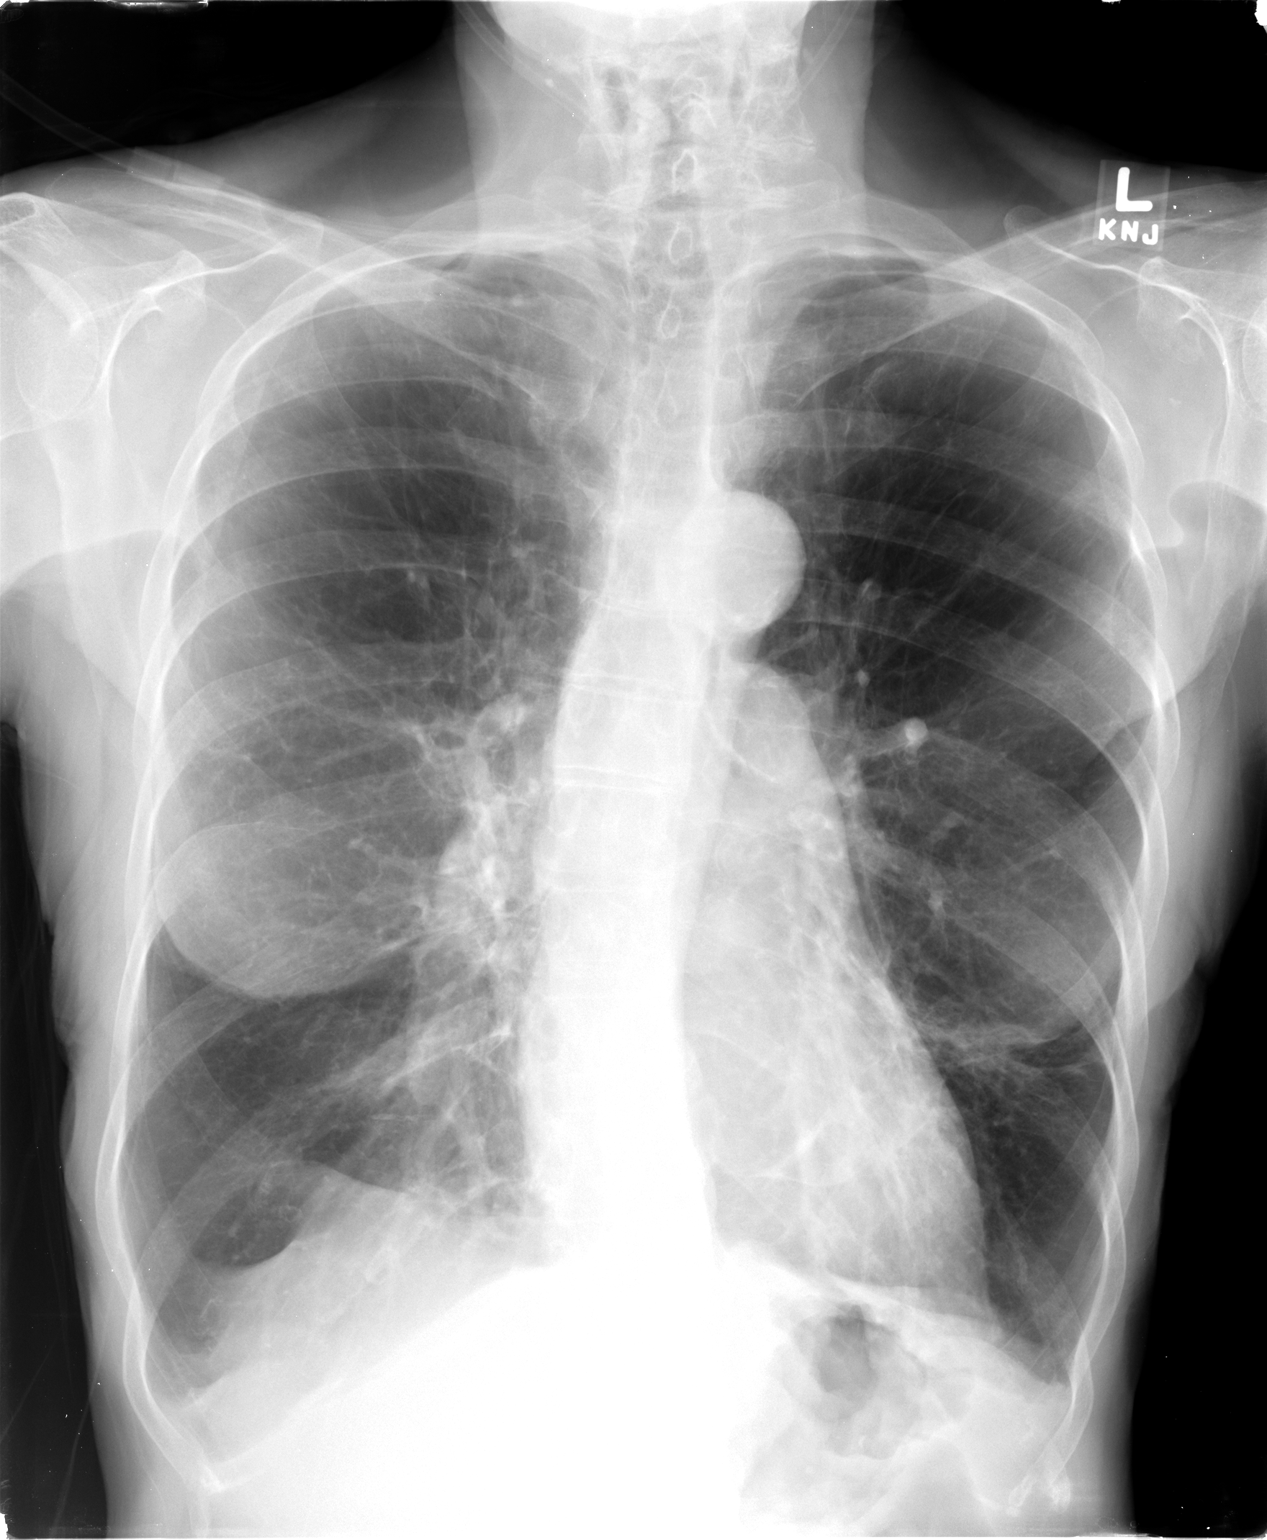

[view not recorded (2 of 2)]
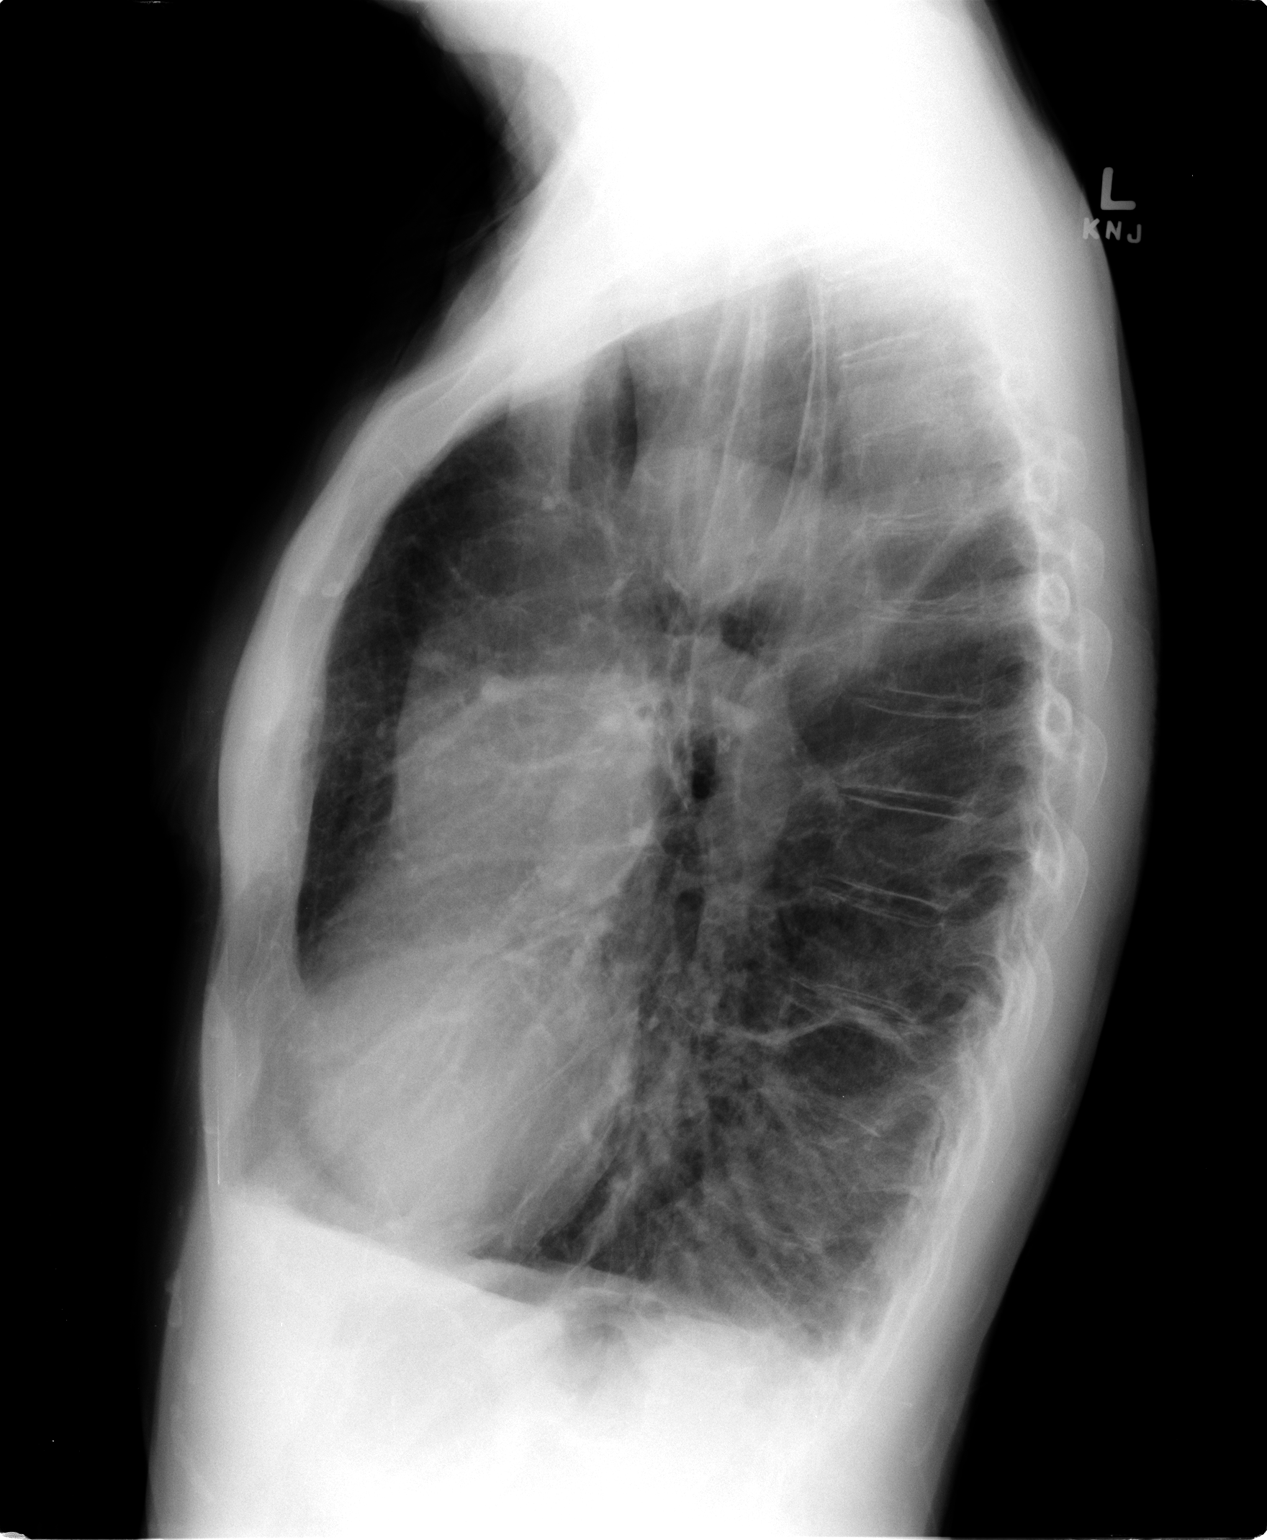

[2 of 2 positions shown; findings below may reference images not displayed]

FINDINGS: Trachea is midline.  Heart size stable.  Pulmonary
arteries appear prominent, as before.  Linear opacities are seen at
the lung bases.  Patchy airspace consolidation seen in the lower
lobes on 05/06/2013 is not as well appreciated on the current exam.
Lungs are emphysematous.  No pleural fluid.
IMPRESSION: Streaky densities at both lung bases.  Previously seen small areas
of patchy bilateral lower lobe air space disease is not as well
seen but can be obscured on routine chest radiography.  As
previously suggested, follow-up CT chest would be confirmatory.

## 2014-03-04 ENCOUNTER — Ambulatory Visit (INDEPENDENT_AMBULATORY_CARE_PROVIDER_SITE_OTHER): Payer: Medicare Other | Admitting: Adult Health

## 2014-03-04 ENCOUNTER — Encounter: Payer: Self-pay | Admitting: Adult Health

## 2014-03-04 VITALS — BP 120/78 | HR 92 | Temp 97.7°F | Ht 67.0 in | Wt 135.8 lb

## 2014-03-04 DIAGNOSIS — J449 Chronic obstructive pulmonary disease, unspecified: Secondary | ICD-10-CM

## 2014-03-04 DIAGNOSIS — J9610 Chronic respiratory failure, unspecified whether with hypoxia or hypercapnia: Secondary | ICD-10-CM

## 2014-03-04 NOTE — Progress Notes (Signed)
Subjective:    Patient ID: [name]    DOB: Dec 15, 1932   MRN: [id number]  Brief patient profile:  11 yowf quit smoking 02/2013  referred 02/08/2012 by Glade Lloyd for intermittent sob was on 02 but stopped in early 2013 and with GOLD III COPD documented 03/2012  History of Present Illness  02/08/2012 1st pulmonary ov/ cc "intermittently" bad breathing x 2-3 y  but on  best day can do  [employer] full aisles  but not mall with episodes much worse last starting 3 weeks prior to OV  rx already by abx and prednisone and some better.  Assoc with congested cough slt discolored better p abx but really has year round am cough x years, worse in winter rec Moderate copd/ emphysema (not severe) I reviewed the Flethcher curve re stopping smoking asap Work on inhaler technique:      Admit date: 05/05/2013  Discharge date: 05/11/2013  Admission Diagnoses:  Dyspnea and cough  Discharge Diagnoses:  Active Problems:  COPD GOLD III  Abnormal CXR  Acute-on-chronic respiratory failure    Admitted for refractory cough and dyspnea. On CXR she had what looked like a new cavitary lesion in the LUL. She had sputum for AFBs which were negative, her Quantiferon gold was negative. She had a CT scan to better define the CXR findings which showed mucous plugging rather than new cavitary changes. Treatment was then aimed at continuing antibiotics for AECOPD, pulmonary hygiene and supportive care. She has improved but is not yet at baseline at time of discharge. From a pulmonary standpoint her plan of care will be as follows:  Plan  Oxygen-->2 liters at rest; increase as needed to sat > 90%  Slow pred taper  Changed symbicort to brovana and budesonide.  Resume spiriva  F/u with [place] in our office       05/15/2013 post hosp f/u / Wert on 2lpm/ tapering prednisone off Chief Complaint  Patient presents with  . HFU    Pt states that her breathing is much improved, almost back at baseline.    maint on  performist/pulmocort/spiriva still very sedentary.  rec For now stay 02 2lpm 24 h per day but if you want to try off at rest it's ok as long as over 90% Plan A  Automatic=    Spriva one capsule daily each am  Plus nebulizer budesonide/formoterol twice daily  Only use your albuterol (plan B ventolin, plan C is the nebulizer)  as a rescue medication to be used if you can't catch your breath by resting or doing a relaxed purse lip breathing pattern. The less you use it, the better it will work when you need it. Ok to use up to every 4 hours if needed with goal of not needing it at all. Plan D =  Doctor, call me if needing the nebulized albuterol more as you come off the prednisone For cough/ congestion >  mucinex 600 mg 1-2 every 12 hours as needed plus use flutter valve        07/09/2013 f/u ov/Wert re severe copd/ 02 dep Chief Complaint  Patient presents with  . Acute Visit    Pt c/o increased cough x 1 wk- prod with minimal clear to yellow sputum. She also c/o having increased SOB for the past wk.    confused with instructions, access to meds, couldn't even find pepcid otc and worse since ran out of meds.  No def purulent sputum, better p saba.  > prednisone taper  09/18/2013 f/u ov/Wert re: copd/ brought med calendar  Chief Complaint  Patient presents with  . Follow-up    Pt states that her breathing is doing well today, and she denies any new co's.  Getting up and down aisles at grocery store on 02 2 lpm and no pred and no sign saba use. rec No change rx, follow med calendar   12/01/2013 f/u ov/Wert re: GOLD III/ 02 2 lpm 24/7  Chief Complaint  Patient presents with  . Follow-up    Pt states that her breathing is doing well. She has had increased cough for the past 3 days- prod with very minimal clear sputum. She states has not needed albuterol at all recently.   >>  03/04/2014 Follow up re :  GOLD III/ 02 2 lpm 24/7  Returns for  3 month follow up  Has some cough at  night---keeps her up at times--clear sputum but the cough is aggravating. Feels it is drainage in throat.  Remains on Performist/ Pulmicort /Spiriva  Remains on O2 at 2l/m , has large tank . Needs portable tank.  Eating well, wt stable now. (last year at 88lbs - now 16 ) Has not smoked in 1 yr (03/05/13) "anniversary"  Patient denies any hemoptysis, orthopnea, PND, or leg swelling.  Current Medications, Allergies, Complete Past Medical History, Past Surgical History, Family History, and Social History were reviewed in [employer] record.  ROS  The following are not active complaints unless bolded sore throat, dysphagia, dental problems, itching, sneezing,  nasal congestion or excess/ purulent secretions, ear ache,   fever, chills, sweats, unintended wt loss, pleuritic or exertional cp, hemoptysis,  orthopnea pnd or leg swelling, presyncope, palpitations, heartburn, abdominal pain, anorexia, nausea, vomiting, diarrhea  or change in bowel or urinary habits, change in stools or urine, dysuria,hematuria,  rash, arthralgias, visual complaints, headache, numbness weakness or ataxia or problems with walking or coordination,  change in mood/affect or memory.              Objective:   Physical Exam   Thin amb wf nad on 02   Wt 97 02/08/2012 > 03/24/2012  99 > 103 05/09/2012 > 06/12/2012  99 >   09/24/2012  96 > 98 01/05/2013 >  106 05/01/2013 > 05/05/2013  99 > 05/15/2013  104 > 06/01/2013 115 > 115 07/07/2013 > 114  07/17/13  > 127 09/18/2013 > 12/01/2013  131 >135 03/04/2014   NAD  HEENT mild turbinate edema.  Oropharynx no thrush or excess pnd or cobblestoning.  No JVD or cervical adenopathy. Mild accessory muscle hypertrophy. Trachea midline, nl thryroid. Chest was hyperinflated by percussion with diminished breath sounds and moderate increased exp time without wheeze. Hoover sign positive at mid inspiration. Regular rate and rhythm without murmur gallop or rub or increase P2 edema  resolved.  Abd: no hsm, nl excursion. Ext warm without cyanosis or clubbing.    CXR  05/15/2013 :   Streaky densities at both lung bases. Previously seen small areas of patchy bilateral lower lobe air space disease is not as well seen but can be obscured on routine chest radiography  CXR 07/17/13 Hyperexpansion of the lungs consistent with chronic obstructive pulmonary disease. No acute abnormality seen.         Assessment & Plan:

## 2014-03-04 NOTE — Addendum Note (Signed)
Addended by: Parke Poisson E on: 03/04/2014 05:13 PM   Modules accepted: Orders

## 2014-03-04 NOTE — Assessment & Plan Note (Signed)
Compensated on present regimen   Plan  Continue on current regimen .  We will send order to [facility] for evaluation for portable O2 tank.  We will refer you to pulmonary rehab.  Begin Zyrtec 10mg . At bedtime  For drainage /tickle in throat .  Follow up Dr. Melvyn Novas  In 3 months and As needed

## 2014-03-04 NOTE — Patient Instructions (Signed)
Continue on current regimen .  We will send order to [facility] for evaluation for portable O2 tank.  We will refer you to pulmonary rehab.  Begin Zyrtec 10mg . At bedtime  For drainage /tickle in throat .  Follow up Dr. Melvyn Novas  In 3 months and As needed

## 2014-03-04 NOTE — Assessment & Plan Note (Signed)
Cont on O2  Portable tank order sent

## 2014-03-12 ENCOUNTER — Encounter: Payer: Self-pay | Admitting: Internal Medicine

## 2014-03-12 ENCOUNTER — Telehealth: Payer: Self-pay | Admitting: Internal Medicine

## 2014-03-12 ENCOUNTER — Ambulatory Visit (INDEPENDENT_AMBULATORY_CARE_PROVIDER_SITE_OTHER): Payer: Medicare Other | Admitting: Internal Medicine

## 2014-03-12 VITALS — BP 110/74 | HR 86 | Temp 97.9°F | Ht 67.0 in | Wt 133.0 lb

## 2014-03-12 DIAGNOSIS — J9610 Chronic respiratory failure, unspecified whether with hypoxia or hypercapnia: Secondary | ICD-10-CM

## 2014-03-12 DIAGNOSIS — J449 Chronic obstructive pulmonary disease, unspecified: Secondary | ICD-10-CM | POA: Diagnosis not present

## 2014-03-12 MED ORDER — PREDNISONE 10 MG PO TABS: ORAL_TABLET | ORAL | Status: DC

## 2014-03-12 NOTE — Patient Instructions (Addendum)
Work on inhaler technique:  relax and gently blow all the way out then take a nice smooth deep breath back in, triggering the inhaler at same time you start breathing in.  Hold for up to 5 seconds if you can.  Rinse and gargle with water when done  Prednisone 10 mg take  4 each am x 2 days,   2 each am x 2 days,  1 each am x 2 days and stop  See calendar for specific medication instructions and bring it back for each and every office visit for every healthcare provider you see.  Without it,  you may not receive the best quality medical care that we feel you deserve.  You will note that the calendar groups together  your maintenance  medications that are timed at particular times of the day.  Think of this as your checklist for what your doctor has instructed you to do until your next evaluation to see what benefit  there is  to staying on a consistent group of medications intended to keep you well.  The other group at the bottom is entirely up to you to use as you see fit  for specific symptoms that may arise between visits that require you to treat them on an as needed basis.  Think of this as your action plan or "what if" list.   Separating the top medications from the bottom group is fundamental to providing you adequate care going forward.    Keep your previous appt but call if not back to your normal baseline next week

## 2014-03-12 NOTE — Progress Notes (Signed)
Subjective:    Patient ID: [name]    DOB: [birth year]   MRN: [id number]  Brief patient profile:  50 yowf quit smoking 02/2013  referred 02/08/2012 by Glade Lloyd for intermittent sob was on 02 but stopped in early 2013 and with GOLD III COPD documented 03/2012    History of Present Illness  02/08/2012 1st pulmonary ov/ cc "intermittently" bad breathing x 2-3 y  but on  best day can do  [employer] full aisles  but not mall with episodes much worse last starting 3 weeks prior to OV  rx already by abx and prednisone and some better.  Assoc with congested cough slt discolored better p abx but really has year round am cough x years, worse in winter rec Moderate copd/ emphysema (not severe) I reviewed the Flethcher curve re stopping smoking asap Work on inhaler technique:      Admit date: 05/05/2013  Discharge date: 05/11/2013  Admission Diagnoses:  Dyspnea and cough  Discharge Diagnoses:  Active Problems:  COPD GOLD III  Abnormal CXR  Acute-on-chronic respiratory failure    Admitted for refractory cough and dyspnea. On CXR she had what looked like a new cavitary lesion in the LUL. She had sputum for AFBs which were negative, her Quantiferon gold was negative. She had a CT scan to better define the CXR findings which showed mucous plugging rather than new cavitary changes. Treatment was then aimed at continuing antibiotics for AECOPD, pulmonary hygiene and supportive care. She has improved but is not yet at baseline at time of discharge. From a pulmonary standpoint her plan of care will be as follows:  Plan  Oxygen-->2 liters at rest; increase as needed to sat > 90%  Slow pred taper  Changed symbicort to brovana and budesonide.  Resume spiriva  F/u with [place] in our office       05/15/2013 post hosp f/u / Josel Keo on 2lpm/ tapering prednisone off Chief Complaint  Patient presents with  . HFU    Pt states that her breathing is much improved, almost back at baseline.    maint on  performist/pulmocort/spiriva still very sedentary.  rec For now stay 02 2lpm 24 h per day but if you want to try off at rest it's ok as long as over 90% Plan A  Automatic=    Spriva one capsule daily each am  Plus nebulizer budesonide/formoterol twice daily  Only use your albuterol (plan B ventolin, plan C is the nebulizer)  as a rescue medication to be used if you can't catch your breath by resting or doing a relaxed purse lip breathing pattern. The less you use it, the better it will work when you need it. Ok to use up to every 4 hours if needed with goal of not needing it at all. Plan D =  Doctor, call me if needing the nebulized albuterol more as you come off the prednisone For cough/ congestion >  mucinex 600 mg 1-2 every 12 hours as needed plus use flutter valve     03/04/2014 Follow up re :  GOLD III/ 02 2 lpm 24/7  Returns for  3 month follow up  Has some cough at night---keeps her up at times--clear sputum but the cough is aggravating. Feels it is drainage in throat.  Remains on Performist/ Pulmicort /Spiriva  Remains on O2 at 2l/m , has large tank . Needs portable tank.  Eating well, wt stable now. (last year at 88lbs - now 135 ) Has not smoked  in 1 yr (03/05/13) "anniversary"  Patient denies any hemoptysis, orthopnea, PND, or leg swelling. rec No change rx   03/12/2014 f/u ov/Egor Fullilove re: on laba/lama/ics and usually not needing saba at all  Chief Complaint  Patient presents with  . Acute Visit    Pt c/o increased cough and fatigue x 1 wk. Cough is prod with minimal clear to yellow sputum.     rx zpak x 2 already, did not push action plan for saba anywhere near max on calendar   No obvious day to day or daytime variabilty or assoc  cp or chest tightness, subjective wheeze overt sinus or hb symptoms. No unusual exp hx or h/o childhood pna/ asthma or knowledge of premature birth.  Sleeping ok without nocturnal  or early am exacerbation  of respiratory  c/o's or need for noct saba.  Also denies any obvious fluctuation of symptoms with weather or environmental changes or other aggravating or alleviating factors except as outlined above   Current Medications, Allergies, Complete Past Medical History, Past Surgical History, Family History, and Social History were reviewed in [employer] record.  ROS  The following are not active complaints unless bolded sore throat, dysphagia, dental problems, itching, sneezing,  nasal congestion or excess/ purulent secretions, ear ache,   fever, chills, sweats, unintended wt loss, pleuritic or exertional cp, hemoptysis,  orthopnea pnd or leg swelling, presyncope, palpitations, heartburn, abdominal pain, anorexia, nausea, vomiting, diarrhea  or change in bowel or urinary habits, change in stools or urine, dysuria,hematuria,  rash, arthralgias, visual complaints, headache, numbness weakness or ataxia or problems with walking or coordination,  change in mood/affect or memory.                   Objective:   Physical Exam   Thin amb wf nad on 02   Wt 97 02/08/2012 > 03/24/2012  99 > 103 05/09/2012 > 06/12/2012  99 >   09/24/2012  96 > 98 01/05/2013 >  106 05/01/2013 > 05/05/2013  99 > 05/15/2013  104 > 06/01/2013 115 > 115 07/07/2013 > 114  07/17/13  > 127 09/18/2013 > 12/01/2013  131 >135 03/04/2014 > 03/12/2014  133   NAD  HEENT mild turbinate edema.  Oropharynx no thrush or excess pnd or cobblestoning.  No JVD or cervical adenopathy. Mild accessory muscle hypertrophy. Trachea midline, nl thryroid. Chest was hyperinflated by percussion with diminished breath sounds and moderate increased exp time without wheeze. Hoover sign positive at mid inspiration. Regular rate and rhythm without murmur gallop or rub or increase P2 edema resolved.  Abd: no hsm, nl excursion. Ext warm without cyanosis or clubbing.    CXR  05/15/2013 :   Streaky densities at both lung bases. Previously seen small areas of patchy bilateral lower lobe air space  disease is not as well seen but can be obscured on routine chest radiography  CXR 07/17/13 Hyperexpansion of the lungs consistent with chronic obstructive pulmonary disease. No acute abnormality seen.         Assessment & Plan:

## 2014-03-12 NOTE — Telephone Encounter (Signed)
Called and spoke with [facility] from [facility]---she stated that the pt was seen by RT from [facility] yesterday and they had some feed back about the pt  Portable eval done yesterday.  Pt was not able to tolerate the pulsed dose of the oxygen .  93% on room air and 92% with 3 liters while walking.  Pt was just not able to tolerate this.  Wanted to make MW aware.  Will forward this information to MW.

## 2014-03-13 NOTE — Assessment & Plan Note (Signed)
-   ono RA 05/14/12   4 h 33 min < 89%   So ordered 02 2lpm    - HCO3  28  05/27/12   - 01/05/2013   Walked RA x one lap @ 185 stopped due to  88%   - 01/05/2013  Walked 2lpm 2 laps @ 185 ft each stopped due to  90% sob but improved vs baseline   - RA 05/01/2013 sats = 87%    Rx= 02 2lpm 24/7  As of 07/07/13  Adequate control on present rx despite mild aecopd , reviewed > no change in rx needed

## 2014-03-13 NOTE — Assessment & Plan Note (Addendum)
-   PFTs 03/24/2012  FEV1  0.91 (43%) ratio 48 and no better p B2,  DLCO 38% corrects to 53%  - HFA 75% 06/01/2013   - changed to perforomist/bud bid 04/2013  - med calendar 07/17/13    Mild flare in setting of uri but not following action plan as listed  The proper method of use, as well as anticipated side effects, of a metered-dose inhaler are discussed and demonstrated to the patient. Improved effectiveness after extensive coaching during this visit to a level of approximately  75% but near 100% with dpi  I had an extended discussion with the patient today lasting 15 to 20 minutes of a 25 minute visit on the following issues:    Each maintenance medication was reviewed in detail including most importantly the difference between maintenance and as needed and under what circumstances the prns are to be used. This was done in the context of a medication calendar review which provided the patient with a user-friendly unambiguous mechanism for medication administration and reconciliation and provides an action plan for all active problems. It is critical that this be shown to every doctor  for modification during the office visit if necessary so the patient can use it as a working document.      Will rx with 6 d of pred, no further abx for now

## 2014-03-19 ENCOUNTER — Telehealth: Payer: Self-pay | Admitting: Internal Medicine

## 2014-03-19 NOTE — Telephone Encounter (Signed)
Spoke with the pt and she is calling to report that her breathing is back to normal baseline for her since last visit  I advised we are delighted to hear this and will forward to MW to make him aware

## 2014-04-05 ENCOUNTER — Telehealth (HOSPITAL_COMMUNITY): Payer: Self-pay

## 2014-04-05 NOTE — Telephone Encounter (Signed)
Spoke with patient regarding entrance to Pulmonary Rehab.  Patient states she is interested and will check with her insurance company for coverage and follow up.

## 2014-04-22 DIAGNOSIS — H4011X0 Primary open-angle glaucoma, stage unspecified: Secondary | ICD-10-CM | POA: Diagnosis not present

## 2014-04-22 DIAGNOSIS — H409 Unspecified glaucoma: Secondary | ICD-10-CM | POA: Diagnosis not present

## 2014-04-26 ENCOUNTER — Encounter (HOSPITAL_COMMUNITY)
Admission: RE | Admit: 2014-04-26 | Discharge: 2014-04-26 | Disposition: A | Discharge status: Home / Self Care | Payer: Medicare Other | Source: Ambulatory Visit | Attending: Internal Medicine | Admitting: Internal Medicine

## 2014-04-26 ENCOUNTER — Encounter (HOSPITAL_COMMUNITY): Payer: Self-pay

## 2014-04-26 VITALS — BP 140/60 | HR 82

## 2014-04-26 DIAGNOSIS — J4489 Other specified chronic obstructive pulmonary disease: Secondary | ICD-10-CM | POA: Insufficient documentation

## 2014-04-26 DIAGNOSIS — J9610 Chronic respiratory failure, unspecified whether with hypoxia or hypercapnia: Secondary | ICD-10-CM | POA: Insufficient documentation

## 2014-04-26 DIAGNOSIS — J449 Chronic obstructive pulmonary disease, unspecified: Secondary | ICD-10-CM | POA: Insufficient documentation

## 2014-04-26 DIAGNOSIS — J441 Chronic obstructive pulmonary disease with (acute) exacerbation: Secondary | ICD-10-CM

## 2014-04-26 DIAGNOSIS — F17200 Nicotine dependence, unspecified, uncomplicated: Secondary | ICD-10-CM | POA: Insufficient documentation

## 2014-04-26 NOTE — Psychosocial Assessment (Signed)
[name] 80 y.o. female  Initial Psychosocial Assessment  Pt psychosocial assessment reveals pt lives with their spouse. Pt is currently retired from [employer]. Pt hobbies include working puzzles and watching television.  Since being diagnosed with COPD she has not been interested in hobbies.   Pt reports her stress level is low. Areas of stress/anxiety include her Health.  Living with oxygen has been a hurdle patient has had to get used to.  She uses oxygen constantly for the last 1 year.   Pt does not exhibit signs of depression. Pt shows good  coping skills with positive outlook . Offered emotional support and reassurance. Monitor and evaluate progress toward psychosocial goal(s).  Goal(s): Short term goal is to gain enough stamina to be able to cook 5 meals per week for patient and her husband. Long term goal is to have the confidence to drive again and be able to manage her oxygen delivery system. Improved knowledge of COPD Help patient work toward returning to meaningful activities that improve patient's QOL and are attainable with patient's lung disease   04/26/2014 11:56 AM

## 2014-04-26 NOTE — Progress Notes (Signed)
[name] 80 y.o. female Pulmonary Rehab Orientation Note Patient arrived today in [facility] for orientation to Pulmonary Rehab. She was transported from [employer] via wheel chair. She does carry portable oxygen. Per pt, she uses oxygen continuously. Color good, skin warm and dry. Patient is oriented to time and place. Patient's medical history and medications reviewed. Heart rate is normal, breath sounds clear to auscultation, no wheezes, rales, or rhonchi. Grip strength equal, strong. Distal pulses 2+ pedal pulses present. Patient reports she does take medications as prescribed. Patient states she follows a Regular. The patient reports no specific efforts to gain or lose weight.  Patient's weight will be monitored closely. Demonstration and practice of PLB using pulse oximeter. Patient able to return demonstration satisfactorily. Safety and hand hygiene in the exercise area reviewed with patient. Patient voices understanding of the information reviewed. Department expectations discussed with patient and achievable goals were set. The patient shows enthusiasm about attending the program and we look forward to working with this nice lady. The patient is scheduled for a 6 min walk test on Tuesday, Apr 27, 2014 @ 3:15 pm and to begin exercise on Thursday, Apr 29, 2014 in the 1:30 pm class.   [phone] Rosebud Poles RN

## 2014-04-26 NOTE — Addendum Note (Signed)
Encounter addended by: Liliane Channel, RN on: 04/26/2014 12:09 PM<BR>     Documentation filed: Visit Diagnoses, Notes Section, Clinical Notes

## 2014-04-26 NOTE — Psychosocial Assessment (Signed)
Staff MD, Rehab Director Note/Attestation  Reviewed pshyosocial assessment. Agree with goal  Laid out by the staff of rehab  Dr. Brand Males, M.D., [facility].C.P Pulmonary and Critical Care Medicine Staff Physician and Director of [facility] Pager: [phone], If no answer or between  15:00h - 7:00h: call [phone]  04/26/2014 1:59 PM

## 2014-04-27 ENCOUNTER — Encounter (HOSPITAL_COMMUNITY): Payer: Self-pay

## 2014-04-27 ENCOUNTER — Encounter (HOSPITAL_COMMUNITY)
Admission: RE | Admit: 2014-04-27 | Discharge: 2014-04-27 | Disposition: A | Discharge status: Home / Self Care | Payer: Medicare Other | Source: Ambulatory Visit | Attending: Internal Medicine | Admitting: Internal Medicine

## 2014-04-27 NOTE — Progress Notes (Signed)
[name] completed a Six-Minute Walk Test on 04/27/14 . [name] walked 1,104 feet with 0 breaks.  The patient's lowest oxygen saturation was 91% , highest heart rate was 135 , and highest blood pressure was 166/84. The patient was on 3 liters of oxygen with a nasal cannula. [name] stated that shortness of breath and leg fatigue hindered their walk test.

## 2014-04-29 ENCOUNTER — Encounter (HOSPITAL_COMMUNITY)
Admission: RE | Admit: 2014-04-29 | Discharge: 2014-04-29 | Disposition: A | Discharge status: Home / Self Care | Payer: Medicare Other | Source: Ambulatory Visit | Attending: Internal Medicine | Admitting: Internal Medicine

## 2014-04-29 DIAGNOSIS — F17200 Nicotine dependence, unspecified, uncomplicated: Secondary | ICD-10-CM | POA: Diagnosis not present

## 2014-04-29 DIAGNOSIS — J9610 Chronic respiratory failure, unspecified whether with hypoxia or hypercapnia: Secondary | ICD-10-CM | POA: Diagnosis not present

## 2014-04-29 DIAGNOSIS — J4489 Other specified chronic obstructive pulmonary disease: Secondary | ICD-10-CM | POA: Diagnosis not present

## 2014-04-29 DIAGNOSIS — J449 Chronic obstructive pulmonary disease, unspecified: Secondary | ICD-10-CM | POA: Diagnosis not present

## 2014-04-29 NOTE — Progress Notes (Signed)
Today, [name] exercised at [employer]. Service time was from 1330 to 1515.  The patient exercised for more than 31 minutes performing aerobic, strengthening, and stretching exercises. Oxygen saturation, heart rate, blood pressure, rate of perceived exertion, and shortness of breath were all monitored before, during, and after exercise. [name] presented with no problems at today's exercise session. Patient attended the physician lecture today.  There was no workload change during today's exercise session.  Pre-exercise vitals:   Weight kg: 57.8   Liters of O2: 2   SpO2: 94   HR: 80   BP: 124/62   CBG: NA  Exercise vitals:   Highest heartrate:  127   Lowest oxygen saturation: 90   Highest blood pressure: 124/60   Liters of 02: 2  Post-exercise vitals:   SpO2: 95   HR: 92   BP: 122/70   Liters of O2: 2   CBG: NA Dr. Brand Males, Medical Director Dr. Coralyn Pear is immediately available during today's Pulmonary Rehab session for [name] on 04/29/2014 at 1330 class time.

## 2014-05-03 DIAGNOSIS — L570 Actinic keratosis: Secondary | ICD-10-CM | POA: Diagnosis not present

## 2014-05-03 DIAGNOSIS — Z85828 Personal history of other malignant neoplasm of skin: Secondary | ICD-10-CM | POA: Diagnosis not present

## 2014-05-04 IMAGING — CR DG CHEST 2V
2 series · 2 of 2 positions shown · non-contrast
Comparison: May 15, 2013.

CLINICAL DATA: Chronic obstructive pulmonary disease

CHEST - 2 VIEW

[view not recorded (1 of 2)]
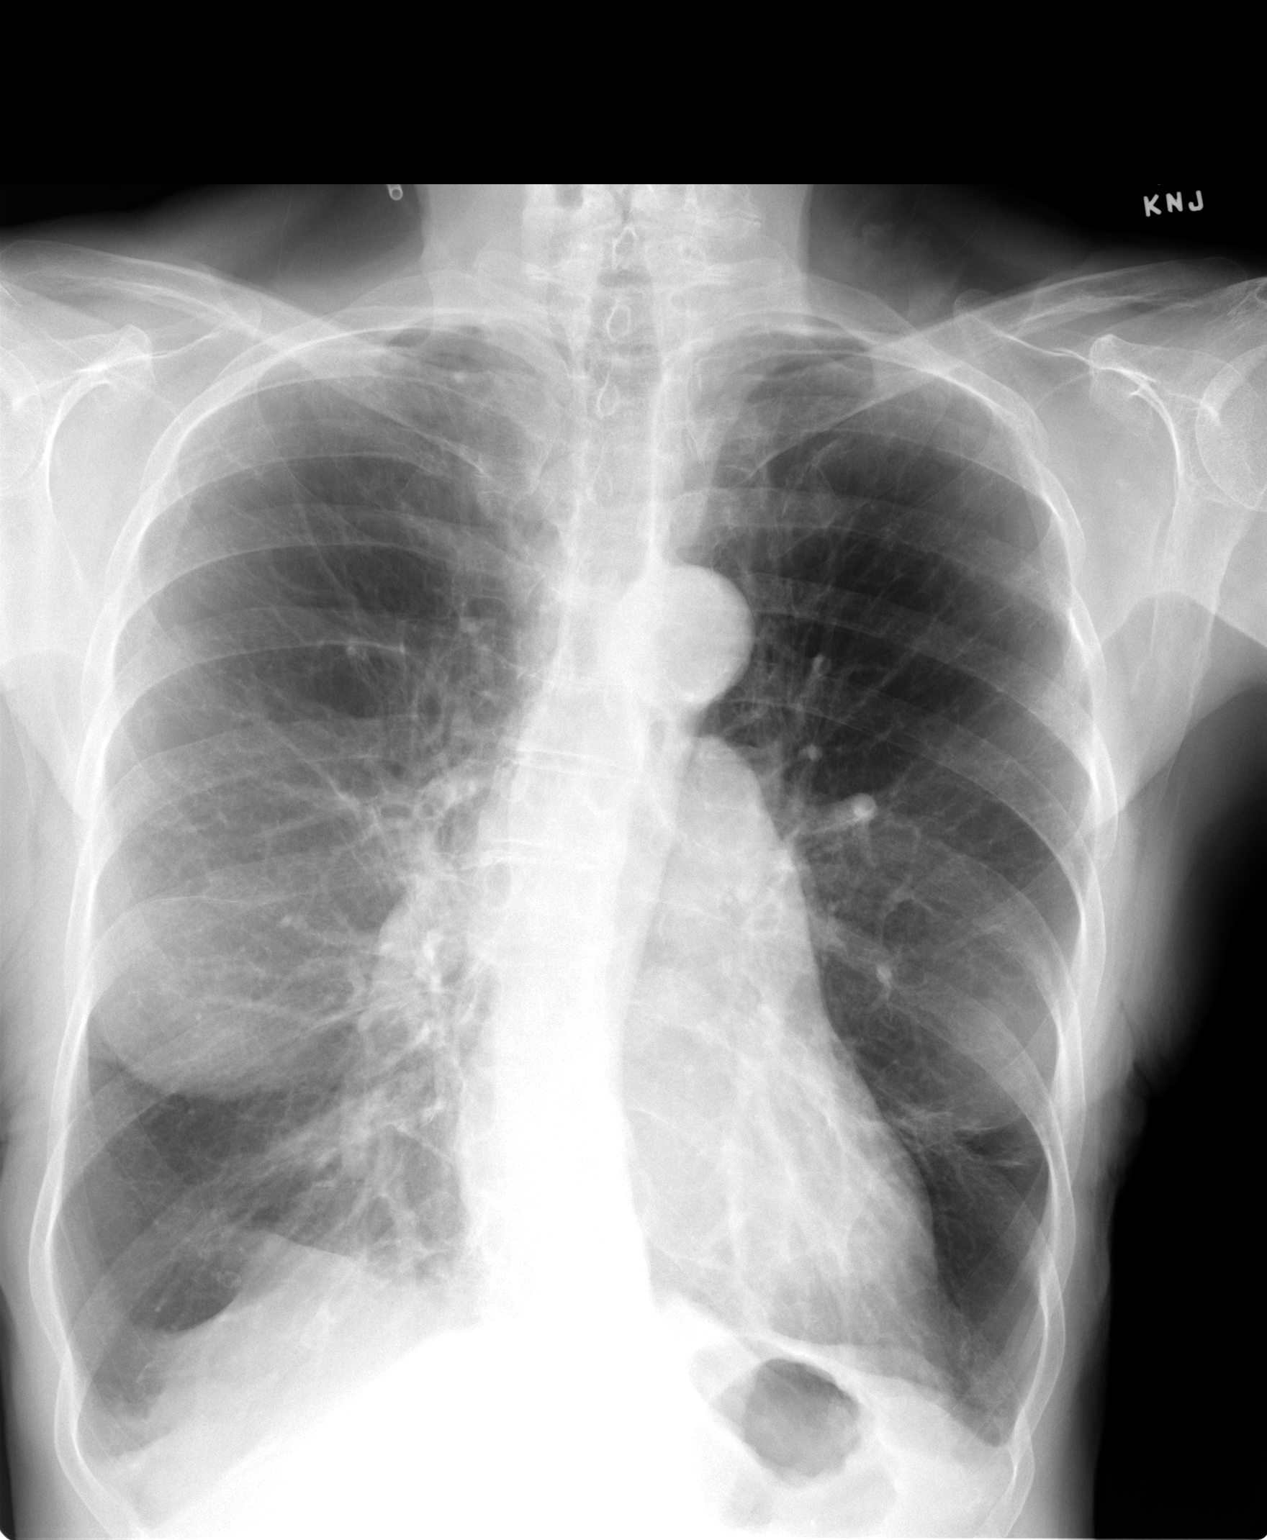

[view not recorded (2 of 2)]
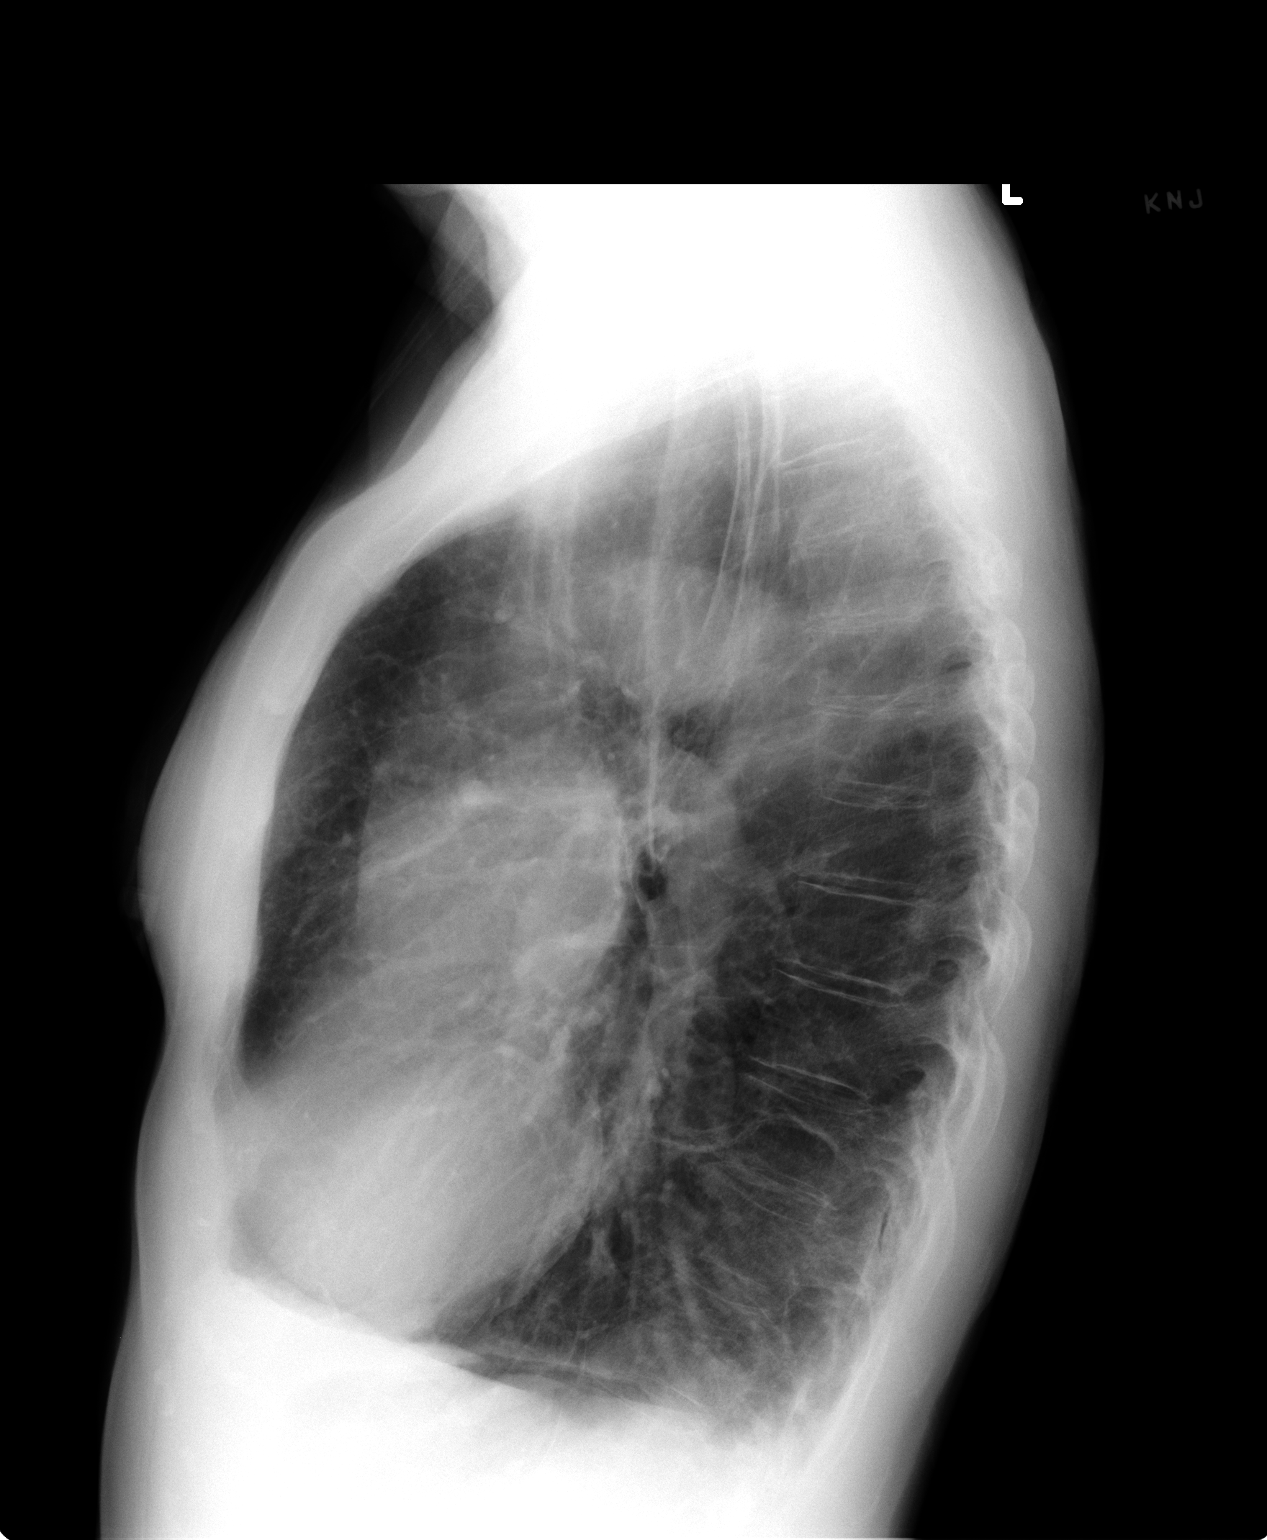

[2 of 2 positions shown; findings below may reference images not displayed]

FINDINGS: Cardiomediastinal silhouette appears normal.  Mild
dextroscoliosis of lower thoracic spine is noted.  Hyperexpansion
of the lungs is noted consistent with chronic obstructive pulmonary
disease.  Emphysematous change is seen bilaterally.  No
pneumothorax or pleural effusion is noted.  No acute pulmonary
disease is noted.  Lingular scarring is again noted.
IMPRESSION: Hyperexpansion of the lungs consistent with chronic obstructive
pulmonary disease.  No acute abnormality seen.

## 2014-05-06 ENCOUNTER — Encounter (HOSPITAL_COMMUNITY)
Admission: RE | Admit: 2014-05-06 | Discharge: 2014-05-06 | Disposition: A | Discharge status: Home / Self Care | Payer: Medicare Other | Source: Ambulatory Visit | Attending: Internal Medicine | Admitting: Internal Medicine

## 2014-05-06 DIAGNOSIS — J449 Chronic obstructive pulmonary disease, unspecified: Secondary | ICD-10-CM | POA: Diagnosis not present

## 2014-05-06 NOTE — Progress Notes (Signed)
Today, [name] exercised at [employer]. Service time was from 1330 to 1545.  The patient exercised for more than 31 minutes performing aerobic, strengthening, and stretching exercises. Oxygen saturation, heart rate, blood pressure, rate of perceived exertion, and shortness of breath were all monitored before, during, and after exercise. [name] presented with no problems at today's exercise session. Patient attended the nutrition for pulmonary patients lecture today.   There was no workload change during today's exercise session.  Pre-exercise vitals:   Weight kg: 61.8   Liters of O2: 2   SpO2: 92   HR: 95   BP: 144/62   CBG: NA  Exercise vitals:   Highest heartrate:  130   Lowest oxygen saturation: 90   Highest blood pressure: 170/80   Liters of 02: 3  Post-exercise vitals:   SpO2: 97   HR: 99   BP: 126/64   Liters of O2: 3   CBG: NA Dr. Brand Males, Medical Director Dr. Aileen Fass is immediately available during today's Pulmonary Rehab session for [name] on 05/06/2014 at 1330 class time.

## 2014-05-11 ENCOUNTER — Encounter (HOSPITAL_COMMUNITY)
Admission: RE | Admit: 2014-05-11 | Discharge: 2014-05-11 | Disposition: A | Discharge status: Home / Self Care | Payer: Medicare Other | Source: Ambulatory Visit | Attending: Internal Medicine | Admitting: Internal Medicine

## 2014-05-11 DIAGNOSIS — J449 Chronic obstructive pulmonary disease, unspecified: Secondary | ICD-10-CM | POA: Diagnosis not present

## 2014-05-11 NOTE — Progress Notes (Signed)
Today, [name] exercised at [employer]. Service time was from 1330 to 1515.  The patient exercised for more than 31 minutes performing aerobic, strengthening, and stretching exercises. Oxygen saturation, heart rate, blood pressure, rate of perceived exertion, and shortness of breath were all monitored before, during, and after exercise. [name] presented with no problems at today's exercise session.   There was no workload change during today's exercise session.  Pre-exercise vitals:   Weight kg: 60.3   Liters of O2: 2L   SpO2: 95   HR: 99   BP: 144/82   CBG: NA  Exercise vitals:   Highest heartrate:  130   Lowest oxygen saturation: 93   Highest blood pressure: 146/84   Liters of 02: 2L  Post-exercise vitals:   SpO2: 95   HR: 99   BP: 122/70   Liters of O2: 2L   CBG: NA  Dr. Brand Males, Medical Director Dr. Aileen Fass is immediately available during today's Pulmonary Rehab session for [name] on 05/11/2014 at 1330 class time.

## 2014-05-13 ENCOUNTER — Encounter (HOSPITAL_COMMUNITY)
Admission: RE | Admit: 2014-05-13 | Discharge: 2014-05-13 | Disposition: A | Discharge status: Home / Self Care | Payer: Medicare Other | Source: Ambulatory Visit | Attending: Internal Medicine | Admitting: Internal Medicine

## 2014-05-13 DIAGNOSIS — J449 Chronic obstructive pulmonary disease, unspecified: Secondary | ICD-10-CM | POA: Diagnosis not present

## 2014-05-13 NOTE — Progress Notes (Signed)
[name] 80 y.o. female Nutrition Note Spoke with pt. Pt is at normal body wt. Per discussion, pt lowest wt was 94.5 lb 12/2012 and her UBW is 130-135 lb. Pt has gained 30.4 lb over the past year according to pt weights in EMR. Pt happy with wt gain and wants to stop gaining wt/"lose a few pounds" at this point. Pt reports she is no longer on prednisone. Pt making healthy food choices the majority of the time.  Pt's Rate Your Plate results reviewed with pt.  Pt does avoids many salty foods; does not use canned/ convenience food.  Pt adds salt to food at the table.  The role of sodium in lung disease reviewed with pt. Pt reported she needs help with grocery shopping/cooking, which pt states "my husband does a lot of that now." Pt expressed understanding of the information reviewed. Nutrition Diagnosis   Food-and nutrition-related knowledge deficit related to lack of exposure to information as related to diagnosis of pulmonary disease  Nutrition Intervention   Pt's individual nutrition plan and goals reviewed with pt.   Benefits of adopting healthy eating habits discussed when pt's Rate Your Plate reviewed.   Pt to attend the Nutrition and Lung Disease class   Continual client-centered nutrition education by RD, as part of interdisciplinary care. Goal(s) 1. Pt to identify and limit food sources of sodium. 2. The pt will recognize symptoms that can interfere with adequate oral intake, such as shortness of breath, N/V, early satiety, fatigue, ability to secure and prepare food, taste and smell changes, chewing/swallowing difficulties, and/ or pain when eating. Monitor and Evaluate progress toward nutrition goal with team.   Derek Mound, M.Ed, RD, LDN, CDE 05/13/2014 2:58 PM

## 2014-05-13 NOTE — Progress Notes (Signed)
Today, [name] exercised at [employer]. Service time was from 1330 to 1500.  The patient exercised for more than 31 minutes performing aerobic, strengthening, and stretching exercises. Oxygen saturation, heart rate, blood pressure, rate of perceived exertion, and shortness of breath were all monitored before, during, and after exercise. [name] presented with no problems at today's exercise session. [name] attended the inhaler education session today.  There was no workload change during today's exercise session.  Pre-exercise vitals:   Weight kg: 60.7   Liters of O2: 2L   SpO2: 93   HR: 90   BP: 124/74   CBG: NA  Exercise vitals:   Highest heartrate:  117   Lowest oxygen saturation: 95   Highest blood pressure: 136/70   Liters of 02: 2L  Post-exercise vitals:   SpO2: 92   HR: 100   BP: 112/62   Liters of O2: 2L   CBG: NA  Dr. Brand Males, Medical Director Dr. Dyann Kief is immediately available during today's Pulmonary Rehab session for [name] on 05/13/2014 at 1330 class time.

## 2014-05-18 ENCOUNTER — Encounter (HOSPITAL_COMMUNITY)
Admission: RE | Admit: 2014-05-18 | Discharge: 2014-05-18 | Disposition: A | Discharge status: Home / Self Care | Payer: Medicare Other | Source: Ambulatory Visit | Attending: Internal Medicine | Admitting: Internal Medicine

## 2014-05-18 DIAGNOSIS — J449 Chronic obstructive pulmonary disease, unspecified: Secondary | ICD-10-CM | POA: Diagnosis not present

## 2014-05-18 NOTE — Progress Notes (Signed)
Today, [name] exercised at [name]. [name]. Service time was from 1330 to 1500.  The patient exercised for more than 31 minutes performing aerobic, strengthening, and stretching exercises. Oxygen saturation, heart rate, blood pressure, rate of perceived exertion, and shortness of breath were all monitored before, during, and after exercise. [name] presented with no problems at today's exercise session.   There was no workload change during today's exercise session.  Pre-exercise vitals:   Weight kg: 60.5   Liters of O2: 2 L   SpO2:    HR: 104   BP: 118/68   CBG: na  Exercise vitals:   Highest heartrate:  126   Lowest oxygen saturation: 93   Highest blood pressure: 154/68   Liters of 02: 2 L  Post-exercise vitals:   SpO2: 95   HR: 101   BP: 112/66   Liters of O2: 2L   CBG: na  [name], Medical Director [name] is immediately available during today's Pulmonary Booker session for [name] on 05/18/2014 at 1330 class time.

## 2014-05-19 DIAGNOSIS — H4011X0 Primary open-angle glaucoma, stage unspecified: Secondary | ICD-10-CM | POA: Diagnosis not present

## 2014-05-19 DIAGNOSIS — H409 Unspecified glaucoma: Secondary | ICD-10-CM | POA: Diagnosis not present

## 2014-05-20 ENCOUNTER — Encounter (HOSPITAL_COMMUNITY)
Admission: RE | Admit: 2014-05-20 | Discharge: 2014-05-20 | Disposition: A | Discharge status: Home / Self Care | Payer: Medicare Other | Source: Ambulatory Visit | Attending: Internal Medicine | Admitting: Internal Medicine

## 2014-05-20 ENCOUNTER — Telehealth: Payer: Self-pay | Admitting: Internal Medicine

## 2014-05-20 DIAGNOSIS — J449 Chronic obstructive pulmonary disease, unspecified: Secondary | ICD-10-CM | POA: Diagnosis not present

## 2014-05-20 NOTE — Progress Notes (Signed)
Today, Yuriko exercised at [employer]. Service time was from 1330 to 1530.  The patient exercised for more than 31 minutes performing aerobic, strengthening, and stretching exercises. Oxygen saturation, heart rate, blood pressure, rate of perceived exertion, and shortness of breath were all monitored before, during, and after exercise. Satsuki presented with no problems at today's exercise session. Vernelle also attended an education session on the s/s of infection.  There was no workload change during today's exercise session.  Pre-exercise vitals:   Weight kg: 60.3   Liters of O2: 2L   SpO2: 95   HR: 97   BP: 138/74   CBG: na  Exercise vitals:   Highest heartrate:  133   Lowest oxygen saturation: 92   Highest blood pressure: 146/78   Liters of 02: 2L  Post-exercise vitals:   SpO2: 96   HR: 97   BP: 128/82   Liters of O2: 2L   CBG: na  Dr. Brand Males, Medical Director Dr. Aileen Fass is immediately available during today's Pulmonary Rehab session for [name] on 05/20/2014 at 1330 class time.

## 2014-05-20 NOTE — Telephone Encounter (Signed)
Spoke with pt.  She states that she is due to finish [employer] today.  Still c/o prod cough (clear-yellow).  Instructed pt that [employer] continues to work 5-10 days even after completing the course and to call us back if symptoms do not improve after that time.  Pt verbalized understanding

## 2014-05-23 ENCOUNTER — Other Ambulatory Visit: Payer: Self-pay | Admitting: Internal Medicine

## 2014-05-24 ENCOUNTER — Encounter (HOSPITAL_COMMUNITY): Payer: Self-pay

## 2014-05-24 NOTE — Progress Notes (Signed)
[facility] [place] Baseline Outcomes Assessment   Anthropometrics:    Height (inches): 66.5   Weight (kg): 61.9   Grip strength was measured using a Dynamometer.  The patient's highest score was a 22.  Functional Status/Exercise Capacity:   [name] had a resting heart rate of 82 BPM, a resting blood pressure of 140/60, and an oxygen saturation of 94 % on 2 liters of O2.  [name] performed a 6-minute walk test on 04/27/14.  The patient completed 1,104 feet in 6 minutes with 0 rest breaks.  This quantifies 2.61 METS.   Dyspnea Measures:   The [facility] is a simple and standardized method of classifying disability in patients with COPD.  The assessment correlates disability and dyspnea.  At entrance the patient scored a 2. The scale is provided below.   0= I only get breathless with strenuous exercise. 1= I get short of breath when hurrying on level ground or walking up a slight incline. 2= On level ground, I walk slower than people of the same age because of breathlessness, or have to stop for breath when walking at my own pace. 3= I stop for breath after walking 100 yards or after a few minutes on level ground. 4=I am too breathless to leave the house or I am breathless when dressing.     The patient completed the [place] ([employer] [facility]).  This questionnaire relates activities of daily living and shortness of breath.  The score ranges from 0-120, a higher score relates to severe shortness of breath during activities of daily living. The patient's score at entrance was 81.  Quality of Life:   Ferrans and Powers Quality of Life Index Pulmonary Version is used to assess the patients satisfaction in different domains of their life; health and functioning, socioeconomic, psychological/spiritual, and family. The overall score is recorded out of 30 points.  The patient's goal is to achieve an overall score of  21 or higher.  [name] received a 25.38 at entrance.    The Patient Health Questionnaire (PHQ-2) is a first step approach for the screening of depression.  If the patient scores positive on the PHQ-2 the patient should be further assessed with the PHQ-9.  The Patient Health Questionnaire (PHQ-9) assesses the degree of depression.  Depression is important to monitor and track in pulmonary patients due to its prevalence in the population.  If the patient advances to the PHQ-9 the goal is to score less than 4 on this assessment.  [name] scored a 0 at entrance.  Clinical Assessment Tools:   The COPD Assessment Test (CAT) is a measurement tool to quantify how much of an impact the disease has on the patient's life.  This assessment aids the Pulmonary Rehab Team in designing the patients individualized treatment plan.  A CAT score ranges from 0-40.  A score of 10 or below indicates that COPD has a low impact on the patient's life whereas a score of 30 or higher indicates a severe impact. The patient's goal is a decrease of 1 point from entrance to discharge.  [name] had a CAT score of 26 at entrance.  Nutrition:   The "Rate My Plate" is a dietary assessment that quantifies the balance of a patient's diet.  This tool allows the Pulmonary Rehab Team to key in on the areas of the patient's diet that needs improving.  The team can then focus their nutritional education on those areas.  If the patient scores 24-40, this means there are many ways they can make their eating habits healthier, 41-57 states that there are some ways they can make their eating habits healthier and a score of 58-72 states that they are making many healthy choices.  The patient's goal is to achieve a score of 49 or higher on this assessment.  [name] scored a 24 at entrance.  Oxygen Compliance:   Patient is currently on 2 liters at rest, 2 liters at night, and 3 liters for exercise.  [name] is not currently using a cpap/bipap at night.   The patient is currently compliant. The patient states that they do not have barriers that keep them from using their oxygen.    Education:   [name] will attend education classes during the course of Pulmonary Rehab.  Education classes that will be offered to the patient are Activities of Daily Living and Energy Conservation, Pursed Lip Breathing and Diaphragmatic Breathing, Nutrition, Exercise for the Pulmonary Patient, Warning Signs of Infection, Chronic Lung Disease, Advanced Directives, Medications, and Stress and Meditation.  The patient completed an assessment at the entrance of the program and will complete it again upon discharge to demonstrate the level of understanding provided by the educational classes.  This assessment includes 14 questions regarding all of the education topics above.  [name] achieved a score of 12/14 at entrance.  Smoking Cessation:  The patient is not currently smoking.  Exercise:   [name] will be provided with an individualized Home Exercise Prescription (HEP) at the entrance of the program.  The patient will be followed by the Pulmonary Exercise Physiologist throughout the program to assist with the progression of the frequency, intensity, time, and type of exercise. The patient's long-term goal is to be exercising 30-60 minutes, 3-5 days per week. At entrance, the patient was exercising 0 days at home.

## 2014-05-25 ENCOUNTER — Encounter (HOSPITAL_COMMUNITY)
Admission: RE | Admit: 2014-05-25 | Discharge: 2014-05-25 | Disposition: A | Discharge status: Home / Self Care | Payer: Medicare Other | Source: Ambulatory Visit | Attending: Internal Medicine | Admitting: Internal Medicine

## 2014-05-25 ENCOUNTER — Telehealth: Payer: Self-pay | Admitting: Internal Medicine

## 2014-05-25 DIAGNOSIS — Z5189 Encounter for other specified aftercare: Secondary | ICD-10-CM | POA: Insufficient documentation

## 2014-05-25 DIAGNOSIS — Z87891 Personal history of nicotine dependence: Secondary | ICD-10-CM | POA: Insufficient documentation

## 2014-05-25 DIAGNOSIS — J449 Chronic obstructive pulmonary disease, unspecified: Secondary | ICD-10-CM | POA: Diagnosis not present

## 2014-05-25 DIAGNOSIS — J9610 Chronic respiratory failure, unspecified whether with hypoxia or hypercapnia: Secondary | ICD-10-CM | POA: Insufficient documentation

## 2014-05-25 DIAGNOSIS — J4489 Other specified chronic obstructive pulmonary disease: Secondary | ICD-10-CM | POA: Insufficient documentation

## 2014-05-25 NOTE — Telephone Encounter (Signed)
Spoke with the pt  She states that she is needing samples of performist  Her shipment has not arrived yet  I left her 2 boxes up front for pick up  Pt aware  Nothing further needed

## 2014-05-25 NOTE — Progress Notes (Signed)
Today, [name] exercised at [employer]. Service time was from 1330 to 1515.  The patient exercised for more than 31 minutes performing aerobic, strengthening, and stretching exercises. Oxygen saturation, heart rate, blood pressure, rate of perceived exertion, and shortness of breath were all monitored before, during, and after exercise. [name] presented with no problems at today's exercise session.   There was no workload change during today's exercise session.  Pre-exercise vitals:   Weight kg: 60.2   Liters of O2: 2   SpO2: 91   HR: 107   BP: 132/72   CBG: NA  Exercise vitals:   Highest heartrate:  128   Lowest oxygen saturation: 90   Highest blood pressure: 144/70   Liters of 02: 2  Post-exercise vitals:   SpO2: 94   HR: 104   BP: 128/64   Liters of O2: 2   CBG: NA Dr. Brand Males, Medical Director Dr. Aileen Fass is immediately available during today's Pulmonary Rehab session for [name] on 05/25/2014 at 1330 class time.

## 2014-05-27 ENCOUNTER — Telehealth (HOSPITAL_COMMUNITY): Payer: Self-pay | Admitting: *Deleted

## 2014-05-27 ENCOUNTER — Encounter (INDEPENDENT_AMBULATORY_CARE_PROVIDER_SITE_OTHER): Payer: Self-pay

## 2014-05-27 ENCOUNTER — Encounter (HOSPITAL_COMMUNITY)
Admission: RE | Admit: 2014-05-27 | Discharge: 2014-05-27 | Disposition: A | Discharge status: Home / Self Care | Payer: Medicare Other | Source: Ambulatory Visit | Attending: Internal Medicine | Admitting: Internal Medicine

## 2014-05-27 ENCOUNTER — Ambulatory Visit (INDEPENDENT_AMBULATORY_CARE_PROVIDER_SITE_OTHER): Payer: Medicare Other | Admitting: Internal Medicine

## 2014-05-27 ENCOUNTER — Encounter: Payer: Self-pay | Admitting: Internal Medicine

## 2014-05-27 VITALS — BP 130/72 | HR 75 | Temp 98.0°F | Ht 67.0 in | Wt 133.0 lb

## 2014-05-27 DIAGNOSIS — J9610 Chronic respiratory failure, unspecified whether with hypoxia or hypercapnia: Secondary | ICD-10-CM

## 2014-05-27 DIAGNOSIS — Z23 Encounter for immunization: Secondary | ICD-10-CM | POA: Diagnosis not present

## 2014-05-27 DIAGNOSIS — J449 Chronic obstructive pulmonary disease, unspecified: Secondary | ICD-10-CM | POA: Diagnosis not present

## 2014-05-27 DIAGNOSIS — J4489 Other specified chronic obstructive pulmonary disease: Secondary | ICD-10-CM

## 2014-05-27 MED ORDER — AMOXICILLIN-POT CLAVULANATE 875-125 MG PO TABS: 1.0000 | ORAL_TABLET | Freq: Two times a day (BID) | ORAL | Status: DC

## 2014-05-27 MED ORDER — PREDNISONE 10 MG PO TABS: ORAL_TABLET | ORAL | Status: DC

## 2014-05-27 NOTE — Psychosocial Assessment (Signed)
[name] 80 y.o. female  30 day Psychosocial Note  Patient psychosocial assessment reveals no barriers to participation in Pulmonary Rehab. Patient has had regular attendance, her husband provides transportation to and from pulmonary rehab, and she looks forward to attending the exercise sessions.   Offered emotional support and reassurance. Patient does feel she is making progress toward Pulmonary Rehab goals.  Patient reports her health and activity level has improved in the past 30 days as evidenced by patient's report of increased ability to walk on the track easier, but the stair steps continue to be difficult. Patient states family/friends have not noticed changes in her activity or mood. Patient reports  feeling positive about current and projected progression in Pulmonary Rehab. After reviewing the patient's treatment plan, the patient is making progress toward Pulmonary Rehab goals. Patient's rate of progress toward rehab goals is fair. Plan of action to help patient continue to work towards rehab goals include increasing workloads on the equipment to increase stamina and endurance. Will continue to monitor and evaluate progress toward psychosocial goal(s).  Goal(s) in progress: Increase workloads as appropriate. Encourage patient to take rest breaks while exercising to prevent excessive heart rate increase Help patient work toward returning to meaningful activities that improve patient's QOL and are attainable with patient's lung disease

## 2014-05-27 NOTE — Progress Notes (Signed)
Subjective:    Patient ID: [name]    DOB: [birth year]   MRN: [id number]  Brief patient profile:  50 yowf quit smoking 02/2013  referred 02/08/2012 by Glade Lloyd for intermittent sob was on 02 but stopped in early 2013 and with GOLD III COPD documented 03/2012    History of Present Illness  02/08/2012 1st pulmonary ov/ cc "intermittently" bad breathing x 2-3 y  but on  best day can do  [employer] full [place]isles  but not mall with episodes much worse last starting 3 weeks prior to OV  rx already by abx and prednisone and some better.  Assoc with congested cough slt discolored better p abx but really has year round am cough x years, worse in winter rec Moderate copd/ emphysema (not severe) I reviewed the Flethcher curve re stopping smoking asap Work on inhaler technique:      Admit date: 05/05/2013  Discharge date: 05/11/2013  Admission Diagnoses:  Dyspnea and cough  Discharge Diagnoses:  Active Problems:  COPD GOLD III  Abnormal CXR  Acute-on-chronic respiratory failure    Admitted for refractory cough and dyspnea. On CXR she had what looked like a new cavitary lesion in the LUL. She had sputum for AFBs which were negative, her Quantiferon gold was negative. She had a CT scan to better define the CXR findings which showed mucous plugging rather than new cavitary changes. Treatment was then aimed at continuing antibiotics for AECOPD, pulmonary hygiene and supportive care. She has improved but is not yet at baseline at time of discharge. From a pulmonary standpoint her plan of care will be as follows:  Plan  Oxygen-->2 liters at rest; increase as needed to sat > 90%  Slow pred taper  Changed symbicort to brovana and budesonide.  Resume spiriva  F/u with [place] in our office       05/15/2013 post hosp f/u / Akito Boomhower on 2lpm/ tapering prednisone off Chief Complaint  Patient presents with  . HFU    Pt states that her breathing is much improved, almost back at baseline.    maint on  performist/pulmocort/spiriva still very sedentary.  rec For now stay 02 2lpm 24 h per day but if you want to try off at rest it's ok as long as over 90% Plan A  Automatic=    Spriva one capsule daily each am  Plus nebulizer budesonide/formoterol twice daily  Only use your albuterol (plan B ventolin, plan C is the nebulizer)  as a rescue medication to be used if you can't catch your breath by resting or doing a relaxed purse lip breathing pattern. The less you use it, the better it will work when you need it. Ok to use up to every 4 hours if needed with goal of not needing it at all. Plan D =  Doctor, call me if needing the nebulized albuterol more as you come off the prednisone For cough/ congestion >  mucinex 600 mg 1-2 every 12 hours as needed plus use flutter valve     03/04/2014 Follow up re :  GOLD III/ 02 2 lpm 24/7  Returns for  3 month follow up  Has some cough at night---keeps her up at times--clear sputum but the cough is aggravating. Feels it is drainage in throat.  Remains on Performist/ Pulmicort /Spiriva  Remains on O2 at 2l/m , has large tank . Needs portable tank.  Eating well, wt stable now. (last year at 88lbs - now 135 ) Has not smoked  in 1 yr (03/05/13) "anniversary"  Patient denies any hemoptysis, orthopnea, PND, or leg swelling. rec No change rx   03/12/2014 f/u ov/Kveon Casanas re: on laba/lama/ics and usually not needing saba at all  Chief Complaint  Patient presents with  . Acute Visit    Pt c/o increased cough and fatigue x 1 wk. Cough is prod with minimal clear to yellow sputum.     rx zpak x 2 already, did not push action plan for saba anywhere near max on calendar  rec Work on inhaler technique:  relax and gently blow all the way out then take a nice smooth deep breath back in, triggering the inhaler at same time you start breathing in.  Hold for up to 5 seconds if you can.  Rinse and gargle with water when done  Prednisone 10 mg take  4 each am x 2 days,   2 each am x  2 days,  1 each am x 2 days and stop  See calendar   05/27/2014 acute ov/Parthenia Tellefsen re: ? aecopd  Chief Complaint  Patient presents with  . Acute Visit    Pt c/o increased cough for the past 6 days. Cough is occ prod with minimal clear to yellow sputum.  Her breathing has also been worse-esp after has "coughing spell".          No obvious day to day or daytime variabilty or assoc  cp or chest tightness, subjective wheeze overt sinus or hb symptoms. No unusual exp hx or h/o childhood pna/ asthma or knowledge of premature birth.  Sleeping ok without nocturnal  or early am exacerbation  of respiratory  c/o's or need for noct saba. Also denies any obvious fluctuation of symptoms with weather or environmental changes or other aggravating or alleviating factors except as outlined above   Current Medications, Allergies, Complete Past Medical History, Past Surgical History, Family History, and Social History were reviewed in [employer] record.  ROS  The following are not active complaints unless bolded sore throat, dysphagia, dental problems, itching, sneezing,  nasal congestion or excess/ purulent secretions, ear ache,   fever, chills, sweats, unintended wt loss, pleuritic or exertional cp, hemoptysis,  orthopnea pnd or leg swelling, presyncope, palpitations, heartburn, abdominal pain, anorexia, nausea, vomiting, diarrhea  or change in bowel or urinary habits, change in stools or urine, dysuria,hematuria,  rash, arthralgias, visual complaints, headache, numbness weakness or ataxia or problems with walking or coordination,  change in mood/affect or memory.                   Objective:   Physical Exam   Thin amb wf nad on 02   Wt 97 02/08/2012 > 03/24/2012  99 > 103 05/09/2012 > 06/12/2012  99 >   09/24/2012  96 > 98 01/05/2013 >  106 05/01/2013 > 05/05/2013  99 > 05/15/2013  104 > 06/01/2013 115 > 115 07/07/2013 > 114  07/17/13  > 127 09/18/2013 > 12/01/2013  131 >135 03/04/2014 >  03/12/2014  133 > 05/27/2014  133   NAD  HEENT mild turbinate edema.  Oropharynx no thrush or excess pnd or cobblestoning.  No JVD or cervical adenopathy. Mild accessory muscle hypertrophy. Trachea midline, nl thryroid. Chest was hyperinflated by percussion with diminished breath sounds and moderate increased exp time without wheeze. Hoover sign positive at mid inspiration. Regular rate and rhythm without murmur gallop or rub or increase P2 edema resolved.  Abd: no hsm, nl excursion. Ext warm without  cyanosis or clubbing.    CXR  05/15/2013 :   Streaky densities at both lung bases. Previously seen small areas of patchy bilateral lower lobe air space disease is not as well seen but can be obscured on routine chest radiography  CXR 07/17/13 Hyperexpansion of the lungs consistent with chronic obstructive pulmonary disease. No acute abnormality seen.         Assessment & Plan:

## 2014-05-27 NOTE — Telephone Encounter (Signed)
Pt called rehab staff to to inform that she will not be at exercise today at 1:30pm.  She has a 2:00 MD appt.

## 2014-05-27 NOTE — Patient Instructions (Addendum)
prevar 13 is the last pneumonia shot you'll need   Prednisone 10 mg take  4 each am x 2 days,   2 each am x 2 days,  1 each am x 2 days and stop   Augmentin 875 mg take one pill twice daily  X 10 days - take at breakfast and supper with large glass of water.  It would help reduce the usual side effects (diarrhea and yeast infections) if you ate cultured yogurt at lunch.   See calendar for specific medication instructions and bring it back for each and every office visit for every healthcare provider you see.  Without it,  you may not receive the best quality medical care that we feel you deserve.  You will note that the calendar groups together  your maintenance  medications that are timed at particular times of the day.  Think of this as your checklist for what your doctor has instructed you to do until your next evaluation to see what benefit  there is  to staying on a consistent group of medications intended to keep you well.  The other group at the bottom is entirely up to you to use as you see fit  for specific symptoms that may arise between visits that require you to treat them on an as needed basis.  Think of this as your action plan or "what if" list.   Separating the top medications from the bottom group is fundamental to providing you adequate care going forward.    Please schedule a follow up visit in 3 months but call sooner if needed

## 2014-05-28 ENCOUNTER — Telehealth: Payer: Self-pay | Admitting: Internal Medicine

## 2014-05-28 MED ORDER — CEFDINIR 300 MG PO CAPS: 300.0000 mg | ORAL_CAPSULE | Freq: Two times a day (BID) | ORAL | Status: DC

## 2014-05-28 NOTE — Assessment & Plan Note (Addendum)
-   PFTs 03/24/2012  FEV1  0.91 (43%) ratio 48 and no better p B2,  DLCO 38% corrects to 53%  - HFA 75% 06/01/2013   - changed to perforomist/bud bid 04/2013   Adherence is always the initial "prime suspect" and is a multilayered concern that requires a "trust but verify" approach in every patient - starting with knowing how to use medications, especially inhalers, correctly, keeping up with refills and understanding the fundamental difference between maintenance and prns vs those medications only taken for a very short course and then stopped and not refilled.  -   Each maintenance medication was reviewed in detail including most importantly the difference between maintenance and as needed and under what circumstances the prns are to be used. This was done in the context of a medication calendar review which provided the patient with a user-friendly unambiguous mechanism for medication administration and reconciliation and provides an action plan for all active problems. It is critical that this be shown to every doctor  for modification during the office visit if necessary so the patient can use it as a working document.      ? Active sinus dz > augmentin x 10 da ? Allergy > Prednisone 10 mg take  4 each am x 2 days,   2 each am x 2 days,  1 each am x 2 days and stop   ? Acid (or non-acid) GERD > always difficult to exclude as up to 75% of pts in some series report no assoc GI/ Heartburn symptoms> rec  No change pepcid at hs but low threshold to add ppi q am if not improved back to baseline

## 2014-05-28 NOTE — Telephone Encounter (Signed)
Spoke with the pt  She states augmentin making her vomit  She has never taken this med before  I have added this to her allergy list  I called and spoke with Dr Melvyn Novas and he rec that pt take omnicef 300 mg bid x 7 days  I explained this to the pt and she verbalized understanding  Rx was sent to pharm and MAR updated

## 2014-05-28 NOTE — Assessment & Plan Note (Signed)
-   ono RA 05/14/12   4 h 33 min < 89%   So ordered 02 2lpm    - HCO3  32 05/10/13    - 01/05/2013   Walked RA x one lap @ 185 stopped due to  88%   - 01/05/2013  Walked 2lpm 2 laps @ 185 ft each stopped due to  90% sob but improved vs baseline   - RA 05/01/2013 sats = 87%    Rx= 02 2lpm 24/7  As of 07/07/13  Adequate control on present rx, reviewed > no change in rx needed

## 2014-05-31 NOTE — Psychosocial Assessment (Signed)
Staff MD, Rehab Director Note/Attestation  Reviewed pshyosocial assessment. Agree with goal  Laid out by the staff of rehab  Dr. Brand Males, M.D., [facility].C.P Pulmonary and Critical Care Medicine Staff Physician and Director of [place] [facility] Pager: [phone], If no answer or between  15:00h - 7:00h: call [phone]  05/31/2014 1:35 AM

## 2014-06-01 ENCOUNTER — Encounter (HOSPITAL_COMMUNITY)
Admission: RE | Admit: 2014-06-01 | Discharge: 2014-06-01 | Disposition: A | Discharge status: Home / Self Care | Payer: Medicare Other | Source: Ambulatory Visit | Attending: Internal Medicine | Admitting: Internal Medicine

## 2014-06-01 NOTE — Progress Notes (Signed)
Today, [name] exercised at [facility] [facility]. Service time was from 1330 to 1500.  The patient exercised for more than 31 minutes performing aerobic, strengthening, and stretching exercises. Oxygen saturation, heart rate, blood pressure, rate of perceived exertion, and shortness of breath were all monitored before, during, and after exercise. [name] presented with no problems at today's exercise session.   There was no workload change during today's exercise session.  Pre-exercise vitals:   Weight kg: 60.6   Liters of O2: 2   SpO2: 92   HR: 93   BP: 144/70   CBG: NA  Exercise vitals:   Highest heartrate:  118   Lowest oxygen saturation: 90   Highest blood pressure: 128/60   Liters of 02: 2  Post-exercise vitals:   SpO2: 94   HR: 99   BP: 104/60   Liters of O2: 2   CBG: NA  Dr. Brand Booker, Medical Director [name] is immediately available during today's Pulmonary Rehab session for [name] on 06/01/2014 at 1330 class time.

## 2014-06-03 ENCOUNTER — Encounter (HOSPITAL_COMMUNITY)
Admission: RE | Admit: 2014-06-03 | Discharge: 2014-06-03 | Disposition: A | Discharge status: Home / Self Care | Payer: Medicare Other | Source: Ambulatory Visit | Attending: Internal Medicine | Admitting: Internal Medicine

## 2014-06-03 NOTE — Progress Notes (Addendum)
Today, [name] exercised at [facility]. Service time was from 1330 to 1530.  The patient exercised for more than 31 minutes performing aerobic, strengthening, and stretching exercises. Oxygen saturation, heart rate, blood pressure, rate of perceived exertion, and shortness of breath were all monitored before, during, and after exercise. [name] presented with no problems at today's exercise session. [name] also attended an education session on advanced directives.  There was no workload change during today's exercise session.  Pre-exercise vitals:   Weight kg: 60.8   Liters of O2: 2L   SpO2: 96   HR: 91   BP: 120/64   CBG: na  Exercise vitals:   Highest heartrate:  114   Lowest oxygen saturation: 94   Highest blood pressure: 134/60   Liters of 02: 2L  Post-exercise vitals:   SpO2: 93   HR: 108 down to 98 with elevation of lower ext   BP: 112/60   Liters of O2: 2L   CBG: na  Dr. Brand Males, Medical Director Dr. Aileen Fass is immediately available during today's Pulmonary Rehab session for [name] on 06/03/2014 at 1330 class time.

## 2014-06-03 NOTE — Progress Notes (Signed)
I have reviewed a Home Exercise Prescription with [name] . [name] is not currently exercising at home.  The patient was advised to walk 3 days a week for 20 minutes.  [name] and I discussed how to progress their exercise prescription.  The patient stated that their goals were to be able to drive herself places and increase endurance.  The patient stated that they understand the exercise prescription.  We reviewed exercise guidelines, target heart rate during exercise, oxygen use, weather, home pulse oximeter, endpoints for exercise, and goals.  Patient is encouraged to come to me with any questions. I will continue to follow up with the patient to assist them with progression and safety.

## 2014-06-08 ENCOUNTER — Encounter (HOSPITAL_COMMUNITY)
Admission: RE | Admit: 2014-06-08 | Discharge: 2014-06-08 | Disposition: A | Discharge status: Home / Self Care | Payer: Medicare Other | Source: Ambulatory Visit | Attending: Internal Medicine | Admitting: Internal Medicine

## 2014-06-08 NOTE — Progress Notes (Signed)
Today, Dustee exercised at [facility]. Service time was from 1330 to 1515.  The patient exercised for more than 31 minutes performing aerobic, strengthening, and stretching exercises. Oxygen saturation, heart rate, blood pressure, rate of perceived exertion, and shortness of breath were all monitored before, during, and after exercise. Hazelynn presented with no problems at today's exercise session.   There was no workload change during today's exercise session.  Pre-exercise vitals:   Weight kg: 60.7   Liters of O2: 2   SpO2: 96   HR: 86   BP: 124/58   CBG: NA  Exercise vitals:   Highest heartrate:  110   Lowest oxygen saturation: 95   Highest blood pressure: 142/74   Liters of 02: 2  Post-exercise vitals:   SpO2: 96   HR: 95   BP: 124/70   Liters of O2: 2   CBG: NA Dr. Brand Males, Medical Director Dr. Aileen Fass is immediately available during today's Pulmonary Rehab session for [name] on 06/08/2014 at 1330 class time.

## 2014-06-10 ENCOUNTER — Encounter (HOSPITAL_COMMUNITY)
Admission: RE | Admit: 2014-06-10 | Discharge: 2014-06-10 | Disposition: A | Discharge status: Home / Self Care | Payer: Medicare Other | Source: Ambulatory Visit | Attending: Internal Medicine | Admitting: Internal Medicine

## 2014-06-10 NOTE — Progress Notes (Signed)
Today, [name] exercised at [facility]. Service time was from 1330 to 1515.  The patient exercised for more than 31 minutes performing aerobic, strengthening, and stretching exercises. Oxygen saturation, heart rate, blood pressure, rate of perceived exertion, and shortness of breath were all monitored before, during, and after exercise. [name] presented with no problems at today's exercise session. [name] also attended an education session on pulmonary medications.  There was a workload change during today's exercise session.  Pre-exercise vitals:   Weight kg: 60.6   Liters of O2: 2L   SpO2: 96   HR: 79   BP: 120/60   CBG: na  Exercise vitals:   Highest heartrate:  92   Lowest oxygen saturation: 94   Highest blood pressure: 122/62   Liters of 02: 2L  Post-exercise vitals:   SpO2: 98   HR: 79   BP: 120/70   Liters of O2: 2L   CBG: na  Dr. Brand Males, Medical Director Dr. Dyann Kief is immediately available during today's Pulmonary Rehab session for [name] on 06/10/2014 at 1330 class time.

## 2014-06-15 ENCOUNTER — Encounter (HOSPITAL_COMMUNITY)
Admission: RE | Admit: 2014-06-15 | Discharge: 2014-06-15 | Disposition: A | Discharge status: Home / Self Care | Payer: Medicare Other | Source: Ambulatory Visit | Attending: Internal Medicine | Admitting: Internal Medicine

## 2014-06-15 NOTE — Progress Notes (Signed)
Today, [name] exercised at [facility]. Service time was from 1330 to 1500.  The patient exercised for more than 31 minutes performing aerobic, strengthening, and stretching exercises. Oxygen saturation, heart rate, blood pressure, rate of perceived exertion, and shortness of breath were all monitored before, during, and after exercise. [name] presented with no problems at today's exercise session.   There was no workload change during today's exercise session.  Pre-exercise vitals:   Weight kg: 61.0   Liters of O2: 2L   SpO2: 96   HR: 92   BP: 112/56   CBG: na  Exercise vitals:   Highest heartrate:  106   Lowest oxygen saturation: 93   Highest blood pressure: 124/78   Liters of 02: 2L  Post-exercise vitals:   SpO2: 95   HR: 88   BP: 112/62   Liters of O2: 2L   CBG: na  Dr. Brand Males, Medical Director Dr. Dyann Kief is immediately available during today's Pulmonary Rehab session for [name] on 06/15/2014 at 1330 class time.

## 2014-06-17 ENCOUNTER — Encounter (HOSPITAL_COMMUNITY)
Admission: RE | Admit: 2014-06-17 | Discharge: 2014-06-17 | Disposition: A | Discharge status: Home / Self Care | Payer: Medicare Other | Source: Ambulatory Visit | Attending: Internal Medicine | Admitting: Internal Medicine

## 2014-06-17 NOTE — Progress Notes (Signed)
Today, [name] exercised at [facility]. Service time was from 1:30pm to 3:30pm.  The patient exercised for more than 31 minutes performing aerobic, strengthening, and stretching exercises. Oxygen saturation, heart rate, blood pressure, rate of perceived exertion, and shortness of breath were all monitored before, during, and after exercise. [name] presented with no problems at today's exercise session. The patient attended Stress and [employer] education class.  There was no workload change during today's exercise session.  Pre-exercise vitals:   Weight kg: 61.0   Liters of O2: 2   SpO2: 92   HR: 90   BP: 110/64   CBG: na  Exercise vitals:   Highest heartrate:  96   Lowest oxygen saturation: 92   Highest blood pressure: 136/70   Liters of 02: 2  Post-exercise vitals:   SpO2: 97   HR: 79   BP: 116/64   Liters of O2: 2   CBG: na  Dr. Brand Males, Medical Director Dr. Aileen Fass is immediately available during today's Pulmonary Rehab session for [name] on 06/17/14 at 1:30pm class time.

## 2014-06-21 DIAGNOSIS — H409 Unspecified glaucoma: Secondary | ICD-10-CM | POA: Diagnosis not present

## 2014-06-21 DIAGNOSIS — H4011X0 Primary open-angle glaucoma, stage unspecified: Secondary | ICD-10-CM | POA: Diagnosis not present

## 2014-06-22 ENCOUNTER — Encounter (HOSPITAL_COMMUNITY)
Admission: RE | Admit: 2014-06-22 | Discharge: 2014-06-22 | Disposition: A | Discharge status: Home / Self Care | Payer: Medicare Other | Source: Ambulatory Visit | Attending: Internal Medicine | Admitting: Internal Medicine

## 2014-06-22 NOTE — Progress Notes (Signed)
Today, Clancy exercised at [facility]. Service time was from 1330 to 1515.  The patient exercised for more than 31 minutes performing aerobic, strengthening, and stretching exercises. Oxygen saturation, heart rate, blood pressure, rate of perceived exertion, and shortness of breath were all monitored before, during, and after exercise. Palma presented with no problems at today's exercise session.   There was no workload change during today's exercise session.  Pre-exercise vitals:   Weight kg: 61.3   Liters of O2: 2L   SpO2: 94   HR: 76   BP: 138/68   CBG: na  Exercise vitals:   Highest heartrate:  108   Lowest oxygen saturation: 89 up to 92 with PLB   Highest blood pressure: 132/76   Liters of 02: 2L  Post-exercise vitals:   SpO2: 98   HR: 85   BP: 118/82   Liters of O2: 2L   CBG: na  Dr. Brand Males, Medical Director Dr. Aileen Fass is immediately available during today's Pulmonary Rehab session for [name] on 06/22/2014 at 1330 class time.

## 2014-06-22 NOTE — Psychosocial Assessment (Signed)
[name] 80 y.o. female60 day  Psychosocial Note  Patient psychosocial assessment reveals no barriers to participation in Pulmonary Rehab. Patient does feel she is making progress toward Pulmonary Rehab goals. Patient reports her health and activity level has improved in the past 30 days as evidenced by patient's report of increased ability to be more active, able to do more cooking at home for her and her husband, and has actually driven her car to some nearby locations. Patient states family/friends have noticed changes in her activity or mood. Her husband has noticed she has a more positive outlook and has more energy.  Patient reports feeling positive about current and projected progression in Pulmonary Rehab. After reviewing the patient's treatment plan, the patient is making progress toward Pulmonary Rehab goals. Patient's rate of progress toward rehab goals is excellent. Plan of action to help patient continue to work towards rehab goals include continue increasing workloads as appropriate.  Will continue to monitor and evaluate progress toward psychosocial goal(s).  Goal(s) in progress: Patient has actually accomplished one of her goals which is being able to drive her car to simple destinations and be able to navigate taking her oxygen with her. Patient's other goal in progress is cooking on a regular basis for her and her husband. Help patient work toward returning to meaningful activities that improve patient's QOL and are attainable with patient's lung disease

## 2014-06-24 ENCOUNTER — Encounter (HOSPITAL_COMMUNITY)
Admission: RE | Admit: 2014-06-24 | Discharge: 2014-06-24 | Disposition: A | Discharge status: Home / Self Care | Payer: Medicare Other | Source: Ambulatory Visit | Attending: Internal Medicine | Admitting: Internal Medicine

## 2014-06-24 DIAGNOSIS — J449 Chronic obstructive pulmonary disease, unspecified: Secondary | ICD-10-CM | POA: Diagnosis not present

## 2014-06-24 DIAGNOSIS — Z87891 Personal history of nicotine dependence: Secondary | ICD-10-CM | POA: Diagnosis not present

## 2014-06-24 DIAGNOSIS — J4489 Other specified chronic obstructive pulmonary disease: Secondary | ICD-10-CM | POA: Insufficient documentation

## 2014-06-24 DIAGNOSIS — Z5189 Encounter for other specified aftercare: Secondary | ICD-10-CM | POA: Diagnosis not present

## 2014-06-24 DIAGNOSIS — J9610 Chronic respiratory failure, unspecified whether with hypoxia or hypercapnia: Secondary | ICD-10-CM | POA: Diagnosis not present

## 2014-06-24 NOTE — Progress Notes (Signed)
Today, Chyla exercised at [facility]. Service time was from 1330 to 1530.  The patient exercised for more than 31 minutes performing aerobic, strengthening, and stretching exercises. Oxygen saturation, heart rate, blood pressure, rate of perceived exertion, and shortness of breath were all monitored before, during, and after exercise. Celinda presented with no problems at today's exercise session. Shariya also attended an education session on oxygen use and safety  There was no workload change during today's exercise session.  Pre-exercise vitals:   Weight kg: 61.1   Liters of O2: 2L   SpO2: 96   HR: 87   BP: 148/80   CBG: na  Exercise vitals:   Highest heartrate:  108   Lowest oxygen saturation: 87 up to 96 with PLB   Highest blood pressure: 132/74   Liters of 02: 2L  Post-exercise vitals:   SpO2: 96   HR: 88   BP: 120/70   Liters of O2: 2L   CBG: na  Dr. Brand Males, Medical Director Dr. Dyann Kief is immediately available during today's Pulmonary Rehab session for [name] on 06/24/2014 at 1330 class time.

## 2014-06-28 ENCOUNTER — Telehealth: Payer: Self-pay | Admitting: Internal Medicine

## 2014-06-28 ENCOUNTER — Encounter: Payer: Self-pay | Admitting: Internal Medicine

## 2014-06-28 ENCOUNTER — Ambulatory Visit (INDEPENDENT_AMBULATORY_CARE_PROVIDER_SITE_OTHER)
Admission: RE | Admit: 2014-06-28 | Discharge: 2014-06-28 | Disposition: A | Discharge status: Home / Self Care | Payer: Medicare Other | Source: Ambulatory Visit | Attending: Internal Medicine | Admitting: Internal Medicine

## 2014-06-28 ENCOUNTER — Ambulatory Visit (INDEPENDENT_AMBULATORY_CARE_PROVIDER_SITE_OTHER): Payer: Medicare Other | Admitting: Internal Medicine

## 2014-06-28 VITALS — BP 142/68 | HR 107 | Temp 97.7°F | Ht 67.0 in | Wt 133.0 lb

## 2014-06-28 DIAGNOSIS — J9610 Chronic respiratory failure, unspecified whether with hypoxia or hypercapnia: Secondary | ICD-10-CM | POA: Diagnosis not present

## 2014-06-28 DIAGNOSIS — J449 Chronic obstructive pulmonary disease, unspecified: Secondary | ICD-10-CM | POA: Diagnosis not present

## 2014-06-28 DIAGNOSIS — J9612 Chronic respiratory failure with hypercapnia: Secondary | ICD-10-CM

## 2014-06-28 DIAGNOSIS — J441 Chronic obstructive pulmonary disease with (acute) exacerbation: Secondary | ICD-10-CM | POA: Diagnosis not present

## 2014-06-28 MED ORDER — PREDNISONE 10 MG PO TABS: ORAL_TABLET | ORAL | Status: DC

## 2014-06-28 NOTE — Assessment & Plan Note (Signed)
-   ono RA 05/14/12   4 h 33 min < 89%   So ordered 02 2lpm    - HCO3  32 05/10/13    - 01/05/2013   Walked RA x one lap @ 185 stopped due to  88%   - 01/05/2013  Walked 2lpm 2 laps @ 185 ft each stopped due to  90% sob but improved vs baseline   - RA 05/01/2013 sats = 87%    Rx= 02 2lpm 24/7  As of 07/07/13

## 2014-06-28 NOTE — Assessment & Plan Note (Signed)
No evidence of pna or need for abx at this point

## 2014-06-28 NOTE — Telephone Encounter (Signed)
Ok to add on with all meds in hand

## 2014-06-28 NOTE — Patient Instructions (Signed)
When bad enough to need your nebulizer > Prednisone 10 mg take  4 each am x 2 days,   2 each am x 2 days,  1 each am x 2 days and stop   Please remember to go to the   x-ray department downstairs for your tests - we will call you with the results when they are available.  Please schedule a follow up office visit in 4 weeks, sooner if needed to see Tammy NP with all meds and med  Calendar in hand

## 2014-06-28 NOTE — Telephone Encounter (Signed)
Called spoke with pt. appt scheduled to come in and see MW this afternoon at 1:45 w/ meds in hand. Nothing further needed

## 2014-06-28 NOTE — Telephone Encounter (Signed)
Called spoke with pt. She is asking to be worked in w/ MW this afternoon since no available opening with any provider. She c/o dry cough, wheezing, chest tx, increase SOB. Please advise MW thanks  Allergies  Allergen Reactions  . Augmentin [Amoxicillin-Pot Clavulanate] Nausea And Vomiting  . Levofloxacin Other (See Comments)    Lowered blood pressre

## 2014-06-28 NOTE — Progress Notes (Signed)
Subjective:    Patient ID: [name]    DOB: [birth year]   MRN: [id number]  Brief patient profile:  50 yowf quit smoking 02/2013  referred 02/08/2012 by Glade Lloyd for intermittent sob was on 02 but stopped in early 2013 and with GOLD III COPD documented 03/2012    History of Present Illness  02/08/2012 1st pulmonary ov/ cc "intermittently" bad breathing x 2-3 y  but on  best day can do  [employer] full [place]isles  but not mall with episodes much worse last starting 3 weeks prior to OV  rx already by abx and prednisone and some better.  Assoc with congested cough slt discolored better p abx but really has year round am cough x years, worse in winter rec Moderate copd/ emphysema (not severe) I reviewed the Flethcher curve re stopping smoking asap Work on inhaler technique:      Admit date: 05/05/2013  Discharge date: 05/11/2013  Admission Diagnoses:  Dyspnea and cough  Discharge Diagnoses:  Active Problems:  COPD GOLD III  Abnormal CXR  Acute-on-chronic respiratory failure    Admitted for refractory cough and dyspnea. On CXR she had what looked like a new cavitary lesion in the LUL. She had sputum for AFBs which were negative, her Quantiferon gold was negative. She had a CT scan to better define the CXR findings which showed mucous plugging rather than new cavitary changes. Treatment was then aimed at continuing antibiotics for AECOPD, pulmonary hygiene and supportive care. She has improved but is not yet at baseline at time of discharge. From a pulmonary standpoint her plan of care will be as follows:  Plan  Oxygen-->2 liters at rest; increase as needed to sat > 90%  Slow pred taper  Changed symbicort to brovana and budesonide.  Resume spiriva  F/u with [place] in our office       05/15/2013 post hosp f/u / Tracie Lindbloom on 2lpm/ tapering prednisone off Chief Complaint  Patient presents with  . HFU    Pt states that her breathing is much improved, almost back at baseline.    maint on  performist/pulmocort/spiriva still very sedentary.  rec For now stay 02 2lpm 24 h per day but if you want to try off at rest it's ok as long as over 90% Plan A  Automatic=    Spriva one capsule daily each am  Plus nebulizer budesonide/formoterol twice daily  Only use your albuterol (plan B ventolin, plan C is the nebulizer)  as a rescue medication to be used if you can't catch your breath by resting or doing a relaxed purse lip breathing pattern. The less you use it, the better it will work when you need it. Ok to use up to every 4 hours if needed with goal of not needing it at all. Plan D =  Doctor, call me if needing the nebulized albuterol more as you come off the prednisone For cough/ congestion >  mucinex 600 mg 1-2 every 12 hours as needed plus use flutter valve     03/04/2014 Follow up re :  GOLD III/ 02 2 lpm 24/7  Returns for  3 month follow up  Has some cough at night---keeps her up at times--clear sputum but the cough is aggravating. Feels it is drainage in throat.  Remains on Performist/ Pulmicort /Spiriva  Remains on O2 at 2l/m , has large tank . Needs portable tank.  Eating well, wt stable now. (last year at 88lbs - now 135 ) Has not smoked  in 1 yr (03/05/13) "anniversary"  Patient denies any hemoptysis, orthopnea, PND, or leg swelling. rec No change rx   03/12/2014 f/u ov/Tobin Cadiente re: on laba/lama/ics and usually not needing saba at all  Chief Complaint  Patient presents with  . Acute Visit    Pt c/o increased cough and fatigue x 1 wk. Cough is prod with minimal clear to yellow sputum.     rx zpak x 2 already, did not push action plan for saba anywhere near max on calendar  rec Work on inhaler technique:  relax and gently blow all the way out then take a nice smooth deep breath back in, triggering the inhaler at same time you start breathing in.  Hold for up to 5 seconds if you can.  Rinse and gargle with water when done  Prednisone 10 mg take  4 each am x 2 days,   2 each am x  2 days,  1 each am x 2 days and stop  See calendar   05/27/2014 acute ov/Demica Zook re: ? aecopd  Chief Complaint  Patient presents with  . Acute Visit    Pt c/o increased cough for the past 6 days. Cough is occ prod with minimal clear to yellow sputum.  Her breathing has also been worse-esp after has "coughing spell".      rec prevar 13 is the last pneumonia shot you'll need   Prednisone 10 mg take  4 each am x 2 days,   2 each am x 2 days,  1 each am x 2 days and stop   Augmentin 875 mg take one pill twice daily  X 10 days - take at breakfast and supper with large glass of water.  It would help reduce the usual side effects (diarrhea and yeast infections) if you ate cultured yogurt at lunch.  See calendar   06/28/2014 f/u ov/Devonne Kitchen re: no calendar / maint on bud/formoterol neb  and spriva dpi Chief Complaint  Patient presents with  . Acute Visit    Pt c/o increased SOB x 5 days.  She gets SOB walking from room to room and from lobby to exam room today. She also c/o heaviness and tighness in her chest.  She has minimal cough- prod with minimal yellow sputum.   at baseline not using proair at all, since got worse only used it twice total "I thought you didn't want me using it"  No obvious day to day or daytime variabilty or assoc     subjective wheeze overt sinus or hb symptoms. No unusual exp hx or h/o childhood pna/ asthma or knowledge of premature birth.  Sleeping ok without nocturnal  or early am exacerbation  of respiratory  c/o's or need for noct saba. Also denies any obvious fluctuation of symptoms with weather or environmental changes or other aggravating or alleviating factors except as outlined above   Current Medications, Allergies, Complete Past Medical History, Past Surgical History, Family History, and Social History were reviewed in [employer] record.  ROS  The following are not active complaints unless bolded sore throat, dysphagia, dental problems,  itching, sneezing,  nasal congestion or excess/ purulent secretions, ear ache,   fever, chills, sweats, unintended wt loss, classically  pleuritic or exertional cp, hemoptysis,  orthopnea pnd or leg swelling, presyncope, palpitations, heartburn, abdominal pain, anorexia, nausea, vomiting, diarrhea  or change in bowel or urinary habits, change in stools or urine, dysuria,hematuria,  rash, arthralgias, visual complaints, headache, numbness weakness or ataxia or  problems with walking or coordination,  change in mood/affect or memory.                   Objective:   Physical Exam   Thin amb wf nad on 02   Wt 97 02/08/2012 > 03/24/2012  99 > 103 05/09/2012 > 06/12/2012  99 >   09/24/2012  96 > 98 01/05/2013 >  106 05/01/2013 > 05/05/2013  99 > 05/15/2013  104 > 06/01/2013 115 > 115 07/07/2013 > 114  07/17/13  > 127 09/18/2013 > 12/01/2013  131 >135 03/04/2014 > 03/12/2014  133 > 05/27/2014  133  > 06/28/2014  133     HEENT mild turbinate edema.  Oropharynx no thrush or excess pnd or cobblestoning.  No JVD or cervical adenopathy. Mild accessory muscle hypertrophy. Trachea midline, nl thryroid. Chest was hyperinflated by percussion with diminished breath sounds and moderate increased exp time without wheeze. Hoover sign positive at mid inspiration. Regular rate and rhythm without murmur gallop or rub or increase P2 edema resolved.  Abd: no hsm, nl excursion. Ext warm without cyanosis or clubbing.      CXR  06/28/2014 :  Findings consistent with chronic obstructive pulmonary disease. No acute cardiopulmonary abnormality seen.         Assessment & Plan:

## 2014-06-28 NOTE — Assessment & Plan Note (Addendum)
-   PFTs 03/24/2012  FEV1  0.91 (43%) ratio 48 and no better p B2,  DLCO 38% corrects to 53%  - changed to perforomist/bud bid 04/2013  - med calendar 07/17/13 > did not bring 06/28/2014   DDX of  difficult airways management all start with A and  include Adherence, Ace Inhibitors, Acid Reflux, Active Sinus Disease, Alpha 1 Antitripsin deficiency, Anxiety masquerading as Airways dz,  ABPA,  allergy(esp in young), Aspiration (esp in elderly), Adverse effects of DPI,  Active smokers, plus two Bs  = Bronchiectasis and Beta blocker use..and one C= CHF  Adherence is always the initial "prime suspect" and is a multilayered concern that requires a "trust but verify" approach in every patient - starting with knowing how to use medications, especially inhalers, correctly, keeping up with refills and understanding the fundamental difference between maintenance and prns vs those medications only taken for a very short course and then stopped and not refilled.    Each maintenance medication was reviewed in detail including most importantly the difference between maintenance and as needed and under what circumstances the prns are to be used. This was done in the context of a medication calendar review which provided the patient with a user-friendly unambiguous mechanism for medication administration and reconciliation and provides an action plan for all active problems. It is critical that this be shown to every doctor  for modification during the office visit if necessary so the patient can use it as a working document.     - The proper method of use, as well as anticipated side effects, of a metered-dose inhaler are discussed and demonstrated to the patient. Improved effectiveness after extensive coaching during this visit to a level of approximately  75% limited by short Ti, reviewed  ? Allergy >  Prednisone 10 mg take  4 each am x 2 days,   2 each am x 2 days,  1 each am x 2 days and stop > activate/ add to the action  plan whenever needs to go to neb as rescue  ? Acid (or non-acid) GERD > always difficult to exclude as up to 75% of pts in some series report no assoc GI/ Heartburn symptoms> rec cont  max (24h)  acid suppression and diet restrictions/ reviewed and instructions given in writing.   See instructions for specific recommendations which were reviewed directly with the patient who was given a copy with highlighter outlining the key components.

## 2014-06-29 ENCOUNTER — Encounter (HOSPITAL_COMMUNITY): Payer: Medicare Other

## 2014-06-29 NOTE — Progress Notes (Signed)
Quick Note:  LMTCB ______ 

## 2014-06-29 NOTE — Psychosocial Assessment (Signed)
Staff MD, Rehab Director Note/Attestation  Reviewed pshyosocial assessment. Agree with goal  Laid out by the staff of rehab  Dr. Brand Males, M.D., [facility].C.P Pulmonary and Critical Care Medicine Staff Physician and Director of [facility] Pager: [phone], If no answer or between  15:00h - 7:00h: call [phone]  06/29/2014 6:49 PM

## 2014-06-30 NOTE — Progress Notes (Signed)
Quick Note:  Pt returned [name]'s call. Advised of cxr results / recs as stated by MW. Pt voiced her understanding and denied any questions/concerns at this time. ______

## 2014-07-01 ENCOUNTER — Encounter (HOSPITAL_COMMUNITY)
Admission: RE | Admit: 2014-07-01 | Discharge: 2014-07-01 | Disposition: A | Discharge status: Home / Self Care | Payer: Medicare Other | Source: Ambulatory Visit | Attending: Internal Medicine | Admitting: Internal Medicine

## 2014-07-01 DIAGNOSIS — Z5189 Encounter for other specified aftercare: Secondary | ICD-10-CM | POA: Diagnosis not present

## 2014-07-01 NOTE — Progress Notes (Signed)
Today, Patriciann exercised at [employer] [facility]. Service time was from 1345 to 1515.  The patient exercised for more than 31 minutes performing aerobic, strengthening, and stretching exercises. Oxygen saturation, heart rate, blood pressure, rate of perceived exertion, and shortness of breath were all monitored before, during, and after exercise. Shoni presented with no problems at today's exercise session. Patient attended the M.D. Lecture today that covered pulmonary diseases.  There was one workload change during today's exercise session.  Pre-exercise vitals:   Weight kg: 61.2   Liters of O2: 2   SpO2: 91   HR: 92   BP: 164/62   CBG: NA  Exercise vitals:   Highest heartrate:  99   Lowest oxygen saturation: 93   Highest blood pressure: 140/70   Liters of 02: 2  Post-exercise vitals:   SpO2: 98   HR: 91   BP: 114/62   Liters of O2: 2   CBG: NA Dr. Brand Males, Medical Director Dr. Aileen Fass is immediately available during today's Pulmonary Rehab session for [name] on 07/01/2014 at 1330 class time.

## 2014-07-06 ENCOUNTER — Encounter (HOSPITAL_COMMUNITY)
Admission: RE | Admit: 2014-07-06 | Discharge: 2014-07-06 | Disposition: A | Discharge status: Home / Self Care | Payer: Medicare Other | Source: Ambulatory Visit | Attending: Internal Medicine | Admitting: Internal Medicine

## 2014-07-06 DIAGNOSIS — Z5189 Encounter for other specified aftercare: Secondary | ICD-10-CM | POA: Diagnosis not present

## 2014-07-06 NOTE — Progress Notes (Signed)
Today, [name] exercised at [facility]. Service time was from 1330 to 1500.  The patient exercised for more than 31 minutes performing aerobic, strengthening, and stretching exercises. Oxygen saturation, heart rate, blood pressure, rate of perceived exertion, and shortness of breath were all monitored before, during, and after exercise. [name] presented with no problems at today's exercise session.   There was no workload change during today's exercise session.  Pre-exercise vitals:   Weight kg: 62.1   Liters of O2: 2L   SpO2: 93   HR: 95   BP: 118/72   CBG: na  Exercise vitals:   Highest heartrate:  110   Lowest oxygen saturation: 92   Highest blood pressure: 140/70   Liters of 02: 2L  Post-exercise vitals:   SpO2: 96   HR: 99   BP: 116/82   Liters of O2: 2L   CBG: na  Dr. Brand Males, Medical Director Dr. Aileen Fass is immediately available during today's Pulmonary Rehab session for [name] on 07/06/2014 at 1330 class time.

## 2014-07-08 ENCOUNTER — Encounter (HOSPITAL_COMMUNITY)
Admission: RE | Admit: 2014-07-08 | Discharge: 2014-07-08 | Disposition: A | Discharge status: Home / Self Care | Payer: Medicare Other | Source: Ambulatory Visit | Attending: Internal Medicine | Admitting: Internal Medicine

## 2014-07-08 DIAGNOSIS — Z5189 Encounter for other specified aftercare: Secondary | ICD-10-CM | POA: Diagnosis not present

## 2014-07-08 NOTE — Progress Notes (Signed)
Today, Laterra exercised at [facility]. Service time was from 1330 to 1530.  The patient exercised for more than 31 minutes performing aerobic, strengthening, and stretching exercises. Oxygen saturation, heart rate, blood pressure, rate of perceived exertion, and shortness of breath were all monitored before, during, and after exercise. Arieona presented with no problems at today's exercise session. Mayline also attended an education session on nutrition for the pulmonary patient.  There was no workload change during today's exercise session.  Pre-exercise vitals:   Weight kg: 61.8   Liters of O2: 2L   SpO2: 96   HR: 120/70   BP: 150/70   CBG: na  Exercise vitals:   Highest heartrate:  115   Lowest oxygen saturation: 95   Highest blood pressure: 150/70   Liters of 02: 2L  Post-exercise vitals:   SpO2: 92   HR: 92   BP: 112/62   Liters of O2: 2L   CBG: na  Dr. Brand Males, Medical Director Dr. Aileen Fass is immediately available during today's Pulmonary Rehab session for [name] on 07/08/2014 at 1330 class time.

## 2014-07-12 ENCOUNTER — Telehealth: Payer: Self-pay | Admitting: Internal Medicine

## 2014-07-12 MED ORDER — BUDESONIDE 0.25 MG/2ML IN SUSP: 0.2500 mg | Freq: Two times a day (BID) | RESPIRATORY_TRACT | Status: DC

## 2014-07-12 NOTE — Telephone Encounter (Signed)
Called and spoke with pt and she stated that she needs the rx for the budesonide faxed to [facility] for refills.  This rx has been printed out and placed on MW desk to be signed.  Will fax once this is completed.

## 2014-07-12 NOTE — Psychosocial Assessment (Signed)
DENYS LABREE 80 y.o. female  43 day Psychosocial Note  Patient psychosocial assessment reveals no barriers to participation in Pulmonary Rehab. Patient does continue to exhibit positive coping skills/attitude to deal with any  psychosocial concerns that may arrise. Offered emotional support and reassurance as needed. Patient does feel she is making progress toward Pulmonary Rehab goals. Patient reports her health and activity level has improved in the past 30 days as evidenced by patient's report of increased ability to have the energy and confidence to drive and manage her portable oxygen. Patient states family/friends, especially her husband have noticed changes in her activity or mood. Patient reports feeling positive about current and projected progression in Pulmonary Rehab. After reviewing the patient's treatment plan, the patient is making progress toward Pulmonary Rehab goals. Patient's rate of progress toward rehab goals is excellent. Plan of action to help patient continue to work towards rehab goals include continuing to increase workload on equipment in order to increase stamina and strength. . Will continue to monitor and evaluate progress toward psychosocial goals.  Goal(s) in progress: Patient continues to feel comfortable with her achieved goal of driving to simple destinations while managing her portable oxygen.  She continues to work on her goal of preparing more meals for her and her husband.  Help patient work toward returning to meaningful activities that improve patient's QOL and are attainable with patient's lung disease

## 2014-07-13 ENCOUNTER — Telehealth (HOSPITAL_COMMUNITY): Payer: Self-pay | Admitting: *Deleted

## 2014-07-13 ENCOUNTER — Encounter (HOSPITAL_COMMUNITY): Admission: RE | Admit: 2014-07-13 | Payer: Medicare Other | Source: Ambulatory Visit

## 2014-07-15 ENCOUNTER — Encounter (HOSPITAL_COMMUNITY)
Admission: RE | Admit: 2014-07-15 | Discharge: 2014-07-15 | Disposition: A | Discharge status: Home / Self Care | Payer: Medicare Other | Source: Ambulatory Visit | Attending: Internal Medicine | Admitting: Internal Medicine

## 2014-07-15 DIAGNOSIS — Z5189 Encounter for other specified aftercare: Secondary | ICD-10-CM | POA: Diagnosis not present

## 2014-07-15 NOTE — Progress Notes (Signed)
Today, [name] exercised at [facility] [facility]. Service time was from 1:30 to 3:30.  The patient exercised for more than 31 minutes performing aerobic, strengthening, and stretching exercises. Oxygen saturation, heart rate, blood pressure, rate of perceived exertion, and shortness of breath were all monitored before, during, and after exercise. [name] presented with no problems at today's exercise session. The patient attended Exercise for the Pulmonary Patient today.  There was an increase in workload change during today's exercise session.  Pre-exercise vitals:   Weight kg: 61.6   Liters of O2: 2   SpO2: 97   HR: 79   BP: 102/60   CBG: na  Exercise vitals:   Highest heartrate:  89   Lowest oxygen saturation: 95   Highest blood pressure: 126/72   Liters of 02: 2  Post-exercise vitals:   SpO2: 97   HR: 76   BP: 112/64   Liters of O2: 2   CBG: na  Dr. Brand Males, Medical Director Dr. Carles Collet is immediately available during today's Pulmonary Rehab session for [name] on 07/15/14 at 1:30pm class time.

## 2014-07-20 ENCOUNTER — Encounter (HOSPITAL_COMMUNITY)
Admission: RE | Admit: 2014-07-20 | Discharge: 2014-07-20 | Disposition: A | Discharge status: Home / Self Care | Payer: Medicare Other | Source: Ambulatory Visit | Attending: Internal Medicine | Admitting: Internal Medicine

## 2014-07-20 DIAGNOSIS — Z5189 Encounter for other specified aftercare: Secondary | ICD-10-CM | POA: Diagnosis not present

## 2014-07-20 NOTE — Progress Notes (Signed)
Today, [name] exercised at [facility] [facility]. Service time was from 1:30 to 3:15pm.  The patient exercised for more than 31 minutes performing aerobic, strengthening, and stretching exercises. Oxygen saturation, heart rate, blood pressure, rate of perceived exertion, and shortness of breath were all monitored before, during, and after exercise. [name] presented with no problems at today's exercise session.   There was no workload change during today's exercise session.  Pre-exercise vitals:   Weight kg: 61.6   Liters of O2: 2   SpO2: 97   HR: 92   BP: 128/76   CBG: na  Exercise vitals:   Highest heartrate:  119   Lowest oxygen saturation: 94   Highest blood pressure: 142/72   Liters of 02: 2  Post-exercise vitals:   SpO2: 96   HR: 92   BP: 122/70   Liters of O2: 2   CBG: na  Dr. Brand Males, Medical Director Dr. Carles Collet is immediately available during today's Pulmonary Rehab session for [name] on 07/20/14 at 1:30pm class time.

## 2014-07-22 ENCOUNTER — Encounter (HOSPITAL_COMMUNITY): Payer: Medicare Other

## 2014-07-26 ENCOUNTER — Encounter: Payer: Self-pay | Admitting: Adult Health

## 2014-07-26 ENCOUNTER — Ambulatory Visit (INDEPENDENT_AMBULATORY_CARE_PROVIDER_SITE_OTHER): Payer: Medicare Other | Admitting: Adult Health

## 2014-07-26 VITALS — BP 142/80 | HR 106 | Temp 98.0°F | Ht 67.0 in | Wt 131.6 lb

## 2014-07-26 DIAGNOSIS — R0902 Hypoxemia: Secondary | ICD-10-CM

## 2014-07-26 DIAGNOSIS — J449 Chronic obstructive pulmonary disease, unspecified: Secondary | ICD-10-CM | POA: Diagnosis not present

## 2014-07-26 DIAGNOSIS — J9610 Chronic respiratory failure, unspecified whether with hypoxia or hypercapnia: Secondary | ICD-10-CM

## 2014-07-26 DIAGNOSIS — J9611 Chronic respiratory failure with hypoxia: Secondary | ICD-10-CM

## 2014-07-26 NOTE — Patient Instructions (Signed)
Keep up good work.  Follow med calendar closely and bring to each visit. Follow Dr. Melvyn Novas in 3 months and as needed

## 2014-07-26 NOTE — Assessment & Plan Note (Signed)
Compensated on present regimen  Patient's medications were reviewed today and patient education was given. Computerized medication calendar was adjusted/completed   Plan  Cont on current regimen

## 2014-07-26 NOTE — Assessment & Plan Note (Signed)
Continue on O2 at 2l/m

## 2014-07-26 NOTE — Progress Notes (Signed)
Subjective:    Patient ID: [name]    DOB: 1932-11-25   MRN: [id number]  Brief patient profile:  68 yowf quit smoking 02/2013  referred 02/08/2012 by Glade Lloyd for intermittent sob was on 02 but stopped in early 2013 and with GOLD III COPD documented 03/2012    History of Present Illness  02/08/2012 1st pulmonary ov/ cc "intermittently" bad breathing x 2-3 y  but on  best day can do  [employer] full [place]isles  but not mall with episodes much worse last starting 3 weeks prior to OV  rx already by abx and prednisone and some better.  Assoc with congested cough slt discolored better p abx but really has year round am cough x years, worse in winter rec Moderate copd/ emphysema (not severe) I reviewed the Flethcher curve re stopping smoking asap Work on inhaler technique:      Admit date: 05/05/2013  Discharge date: 05/11/2013  Admission Diagnoses:  Dyspnea and cough  Discharge Diagnoses:  Active Problems:  COPD GOLD III  Abnormal CXR  Acute-on-chronic respiratory failure    Admitted for refractory cough and dyspnea. On CXR she had what looked like a new cavitary lesion in the LUL. She had sputum for AFBs which were negative, her Quantiferon gold was negative. She had a CT scan to better define the CXR findings which showed mucous plugging rather than new cavitary changes. Treatment was then aimed at continuing antibiotics for AECOPD, pulmonary hygiene and supportive care. She has improved but is not yet at baseline at time of discharge. From a pulmonary standpoint her plan of care will be as follows:  Plan  Oxygen-->2 liters at rest; increase as needed to sat > 90%  Slow pred taper  Changed symbicort to brovana and budesonide.  Resume spiriva  F/u with [place] in our office       05/15/2013 post hosp f/u / Wert on 2lpm/ tapering prednisone off Chief Complaint  Patient presents with  . HFU    Pt states that her breathing is much improved, almost back at baseline.    maint on  performist/pulmocort/spiriva still very sedentary.  rec For now stay 02 2lpm 24 h per day but if you want to try off at rest it's ok as long as over 90% Plan A  Automatic=    Spriva one capsule daily each am  Plus nebulizer budesonide/formoterol twice daily  Only use your albuterol (plan B ventolin, plan C is the nebulizer)  as a rescue medication to be used if you can't catch your breath by resting or doing a relaxed purse lip breathing pattern. The less you use it, the better it will work when you need it. Ok to use up to every 4 hours if needed with goal of not needing it at all. Plan D =  Doctor, call me if needing the nebulized albuterol more as you come off the prednisone For cough/ congestion >  mucinex 600 mg 1-2 every 12 hours as needed plus use flutter valve     03/04/2014 Follow up re :  GOLD III/ 02 2 lpm 24/7  Returns for  3 month follow up  Has some cough at night---keeps her up at times--clear sputum but the cough is aggravating. Feels it is drainage in throat.  Remains on Performist/ Pulmicort /Spiriva  Remains on O2 at 2l/m , has large tank . Needs portable tank.  Eating well, wt stable now. (last year at 88lbs - now 135 ) Has not smoked  in 1 yr (03/05/13) "anniversary"  Patient denies any hemoptysis, orthopnea, PND, or leg swelling. rec No change rx   03/12/2014 f/u ov/Wert re: on laba/lama/ics and usually not needing saba at all  Chief Complaint  Patient presents with  . Acute Visit    Pt c/o increased cough and fatigue x 1 wk. Cough is prod with minimal clear to yellow sputum.     rx zpak x 2 already, did not push action plan for saba anywhere near max on calendar  rec Work on inhaler technique:  relax and gently blow all the way out then take a nice smooth deep breath back in, triggering the inhaler at same time you start breathing in.  Hold for up to 5 seconds if you can.  Rinse and gargle with water when done  Prednisone 10 mg take  4 each am x 2 days,   2 each am x  2 days,  1 each am x 2 days and stop  See calendar   05/27/2014 acute ov/Wert re: ? aecopd  Chief Complaint  Patient presents with  . Acute Visit    Pt c/o increased cough for the past 6 days. Cough is occ prod with minimal clear to yellow sputum.  Her breathing has also been worse-esp after has "coughing spell".      rec prevar 13 is the last pneumonia shot you'll need   Prednisone 10 mg take  4 each am x 2 days,   2 each am x 2 days,  1 each am x 2 days and stop   Augmentin 875 mg take one pill twice daily  X 10 days - take at breakfast and supper with large glass of water.  It would help reduce the usual side effects (diarrhea and yeast infections) if you ate cultured yogurt at lunch.  See calendar   06/28/2014 f/u ov/Wert re: no calendar / maint on bud/formoterol neb  and spriva dpi Chief Complaint  Patient presents with  . Acute Visit    Pt c/o increased SOB x 5 days.  She gets SOB walking from room to room and from lobby to exam room today. She also c/o heaviness and tighness in her chest.  She has minimal cough- prod with minimal yellow sputum.   at baseline not using proair at all, since got worse only used it twice total "I thought you didn't want me using it" >>CXR >copd changes   07/26/2014  Follow up severe COPD  Pt returns for 1 month follow up and med review  We reviewed all her meds and organized them into a medication calendar w/ pt education.  Appears to be taking meds correctly.  Going to pulmonary rehab. Really likes program, making progress.  We discussed IMPACT study , she is considering it.  She denies chest pain, orthopnea, edema or fever.  CXR last ov w/ chronic changes.  Immunizations are utd.    Current Medications, Allergies, Complete Past Medical History, Past Surgical History, Family History, and Social History were reviewed in [employer] record.  ROS  The following are not active complaints unless bolded sore throat, dysphagia,  dental problems, itching, sneezing,  nasal congestion or excess/ purulent secretions, ear ache,   fever, chills, sweats, unintended wt loss, classically  pleuritic or exertional cp, hemoptysis,  orthopnea pnd or leg swelling, presyncope, palpitations, heartburn, abdominal pain, anorexia, nausea, vomiting, diarrhea  or change in bowel or urinary habits, change in stools or urine, dysuria,hematuria,  rash, arthralgias,  visual complaints, headache, numbness weakness or ataxia or problems with walking or coordination,  change in mood/affect or memory.                   Objective:   Physical Exam   Thin amb wf nad on 02   Wt 97 02/08/2012 > 03/24/2012  99 > 103 05/09/2012 > 06/12/2012  99 >   09/24/2012  96 > 98 01/05/2013 >  106 05/01/2013 > 05/05/2013  99 > 05/15/2013  104 > 06/01/2013 115 > 115 07/07/2013 > 114  07/17/13  > 127 09/18/2013 > 12/01/2013  131 >135 03/04/2014 > 03/12/2014  133 > 05/27/2014  133  > 06/28/2014  133 >131 07/26/2014     HEENT mild turbinate edema.  Oropharynx no thrush or excess pnd or cobblestoning.  No JVD or cervical adenopathy. Mild accessory muscle hypertrophy. Trachea midline, nl thryroid. Chest was hyperinflated by percussion with diminished breath sounds and moderate increased exp time without wheeze. Hoover sign positive at mid inspiration. Regular rate and rhythm without murmur gallop or rub or increase P2 edema resolved.  Abd: no hsm, nl excursion. Ext warm without cyanosis or clubbing.      CXR  06/28/2014 :  Findings consistent with chronic obstructive pulmonary disease. No acute cardiopulmonary abnormality seen.         Assessment & Plan:

## 2014-07-27 ENCOUNTER — Encounter (HOSPITAL_COMMUNITY)
Admission: RE | Admit: 2014-07-27 | Discharge: 2014-07-27 | Disposition: A | Discharge status: Home / Self Care | Payer: Medicare Other | Source: Ambulatory Visit | Attending: Internal Medicine | Admitting: Internal Medicine

## 2014-07-27 DIAGNOSIS — Z87891 Personal history of nicotine dependence: Secondary | ICD-10-CM | POA: Insufficient documentation

## 2014-07-27 DIAGNOSIS — J4489 Other specified chronic obstructive pulmonary disease: Secondary | ICD-10-CM | POA: Insufficient documentation

## 2014-07-27 DIAGNOSIS — J449 Chronic obstructive pulmonary disease, unspecified: Secondary | ICD-10-CM | POA: Diagnosis not present

## 2014-07-27 DIAGNOSIS — J9610 Chronic respiratory failure, unspecified whether with hypoxia or hypercapnia: Secondary | ICD-10-CM | POA: Insufficient documentation

## 2014-07-27 DIAGNOSIS — Z5189 Encounter for other specified aftercare: Secondary | ICD-10-CM | POA: Diagnosis not present

## 2014-07-27 NOTE — Progress Notes (Signed)
Today, [name] exercised at [employer]. [facility]. Service time was from 1330 to 1515.  The patient exercised for more than 31 minutes performing aerobic, strengthening, and stretching exercises. Oxygen saturation, heart rate, blood pressure, rate of perceived exertion, and shortness of breath were all monitored before, during, and after exercise. [name] presented with no problems at today's exercise session.   There was no workload change during today's exercise session.  Pre-exercise vitals:   Weight kg: 61.0   Liters of O2: 2L   SpO2: 93   HR: 81   BP: 98/70   CBG: na  Exercise vitals:   Highest heartrate:  107   Lowest oxygen saturation: 94   Highest blood pressure: 142/60   Liters of 02: 2L  Post-exercise vitals:   SpO2: 95   HR: 82   BP: 126/70   Liters of O2: 2L   CBG: na  Dr. Brand Males, Medical Director Dr. Coralyn Pear is immediately available during today's Pulmonary Rehab session for [name] on 07/27/2014 at 1330 class time.

## 2014-07-27 NOTE — Psychosocial Assessment (Signed)
Staff MD, Rehab Director Note/Attestation  Reviewed pshyosocial assessment. Agree with goal  Laid out by the staff of rehab  Dr. Brand Males, M.D., [facility].C.P Pulmonary and Critical Care Medicine Staff Physician and Director of [place] [facility] Pager: [phone], If no answer or between  15:00h - 7:00h: call [phone]  07/27/2014 4:06 PM

## 2014-07-29 ENCOUNTER — Encounter (HOSPITAL_COMMUNITY)
Admission: RE | Admit: 2014-07-29 | Discharge: 2014-07-29 | Disposition: A | Discharge status: Home / Self Care | Payer: Medicare Other | Source: Ambulatory Visit | Attending: Internal Medicine | Admitting: Internal Medicine

## 2014-07-29 DIAGNOSIS — Z5189 Encounter for other specified aftercare: Secondary | ICD-10-CM | POA: Diagnosis not present

## 2014-07-29 NOTE — Progress Notes (Signed)
Today, Amarie exercised at [employer]. [facility]. Service time was from 1330 to 1515.  The patient exercised for more than 31 minutes performing aerobic, strengthening, and stretching exercises. Oxygen saturation, heart rate, blood pressure, rate of perceived exertion, and shortness of breath were all monitored before, during, and after exercise. Aleksa presented with no problems at today's exercise session. Chaela also attended education session on advanced directives.  There was no workload change during today's exercise session.  Pre-exercise vitals:   Weight kg: 60.7   Liters of O2: 2L   SpO2: 96   HR: 96   BP: 128/60   CBG: na  Exercise vitals:   Highest heartrate:  121 down to 104 with rest break   Lowest oxygen saturation: 93   Highest blood pressure: 132/74   Liters of 02: 2L  Post-exercise vitals:   SpO2: 96   HR: 84   BP: 114/60   Liters of O2: 2L   CBG: na  Dr. Brand Males, Medical Director Dr. Frederic Jericho is immediately available during today's Pulmonary Rehab session for [name] on 07/29/2014 at 1330 class time.

## 2014-08-03 ENCOUNTER — Encounter (HOSPITAL_COMMUNITY)
Admission: RE | Admit: 2014-08-03 | Discharge: 2014-08-03 | Disposition: A | Discharge status: Home / Self Care | Payer: Medicare Other | Source: Ambulatory Visit | Attending: Internal Medicine | Admitting: Internal Medicine

## 2014-08-03 DIAGNOSIS — Z5189 Encounter for other specified aftercare: Secondary | ICD-10-CM | POA: Diagnosis not present

## 2014-08-05 ENCOUNTER — Encounter (HOSPITAL_COMMUNITY)
Admission: RE | Admit: 2014-08-05 | Discharge: 2014-08-05 | Disposition: A | Discharge status: Home / Self Care | Payer: Medicare Other | Source: Ambulatory Visit | Attending: Internal Medicine | Admitting: Internal Medicine

## 2014-08-05 NOTE — Addendum Note (Signed)
Addended by: Parke Poisson E on: 08/05/2014 01:20 PM   Modules accepted: Orders, Medications

## 2014-08-06 ENCOUNTER — Encounter (HOSPITAL_COMMUNITY): Payer: Self-pay

## 2014-08-06 ENCOUNTER — Other Ambulatory Visit: Payer: Self-pay | Admitting: Internal Medicine

## 2014-08-06 DIAGNOSIS — R0602 Shortness of breath: Secondary | ICD-10-CM

## 2014-08-06 NOTE — Progress Notes (Signed)
[facility] Final/Discharge Outcome Results  Anthropometrics:   Height (inches): 66.5   Weight (kg): 60.3   This is a change of -1.6kg from entrance.   Grip strength was measured using a Dynamometer.  The patient's discharge score was a 31.     This is a change of -1 from entrance.  Functional Status/Exercise Capacity:   Arthelia had a resting heart rate of 108 BPM, a resting blood pressure of 120/60, and an oxygen saturation of 94 % on 3 liters of O2.  Luverta performed a discharge 6-minute walk test on 08/05/14.  The patient completed 937 feet in 6 minutes with 1 rest breaks.  This quantifies 2.30 METS. The patient's goal is to add 82 feet onto the baseline 6MWT.     The patient decreased their 6-minute walk test distance by 167 feet and their MET level by 0.31 METs.  Dyspnea Measures:   The [facility] is a simple and standardized method of classifying disability in patients with COPD.  The assessment correlates disability and dyspnea.  Upon discharge the patients resting score was 4. The scale is provided below.    This is a change of +2 from entrance.  0= I only get breathless with strenuous exercise. 1= I get short of breath when hurrying on level ground or walking up a slight incline. 2= On level ground, I walk slower than people of the same age because of breathlessness, or have to stop for breath when walking at my own pace. 3= I stop for breath after walking 100 yards or after a few minutes on level ground. 4=I am too breathless to leave the house or I am breathless when dressing.     The patient completed the [place] ([facility]).  This questionnaire relates activities of daily living and shortness of breath.  The score ranges from 0-120, a higher score relates to severe shortness of breath during activities of daily living. The patient's score at discharge was 60.   This is a change  of -21 from entrance.  Quality of Life:   Ferrans and Powers Quality of Life Index Pulmonary Version is used to assess the patients satisfaction in different domains of their life; health and functioning, socioeconomic, psychological/spiritual, and family. The overall score is recorded out of 30 points.  The patient's goal is to achieve an overall score of 21 or higher.  Chonda received a 25.0 upon discharge.    This is a change of -.38 from entrance.  Clinical Assessment Tools:   The COPD Assessment Test (CAT) is a measurement tool to quantify how much of an impact the disease has on the patient's life.  This assessment aids the Pulmonary Rehab Team in designing the patients individualized treatment plan.  A CAT score ranges from 0-40.  A score of 10 or below indicates that COPD has a low impact on the patient's life whereas a score of 30 or higher indicates a severe impact. The patient's goal is a decrease of 1 point from entrance to discharge.  Violetta had a CAT score of 34 upon discharge.     This is a change of +8 from entrance.  Nutrition:   The "Rate My Plate" is a dietary assessment that quantifies the balance of a patient's diet.  This tool allows the Pulmonary Rehab Team to key in on the areas of the patient's diet that needs improving.  The team can then  focus their nutritional education on those areas.  If the patient scores 24-40, this means there are many ways they can make their eating habits healthier, 41-57 states that there are some ways they can make their eating habits healthier and a score of 58-72 states that they are making many healthy choices.  The patient's goal is to achieve a score of 49 or higher on this assessment.  [place] scored a 29 upon discharge.     This is a change of -32 from entrance.  Oxygen Compliance:   Patient is currently on 2 liters at rest, 2 liters at night, and 3 liters for exercise.  [place] is not currently using cpap/bipap at night.  The patient is  currently compliant..  The patient states that they do not have barriers that keep them from using their oxygen.     This is a no change result from entrance.    Education:   Hoyle Sauer attended over 75% of education classes.  Luis completed a discharge educational assessment and achieved a score of 9/14.    This is a change of -3 from entrance.   Smoking Cessation:  The patient is not currently smoking.   Exercise:   Karena was provided with an individualized Home Exercise Prescription (HEP) at the entrance of the program.  The patient's goal is to be exercising 30-60 minutes, 3-5 days per week. Upon discharge the patient is exercising 0 days at home.  This is a change of 0 from entrance.  After graduation from Pulmonary Rehab, Lonna will continue exercising at home.

## 2014-08-06 NOTE — Psychosocial Assessment (Signed)
[name] 80 y.o. female  120 day Psychosocial Note  Patient psychosocial assessment reveals no barriers to participation in Pulmonary Rehab. Patient does continue to exhibit positive attitude about her rehabilitation. Offered emotional support and reassurance. Patient does feel she is making progress toward Pulmonary Rehab goals. She continues to drive to simple destinations occasionally, managing her portable. Patient reports her health and activity level has improved in the past 30 days as evidenced by patient's report of increased ability to run errands and cook meals. She is still unable to cook 5 meals per week.Patient states her husband andfriends have noticed changes in her activity or mood. Patient reports feeling positive about current and projected progression in Pulmonary Rehab. She is excited to be in an inhaler study thru her pulmonologist office. After reviewing the patient's treatment plan, the patient is making progress toward Pulmonary Rehab goals. Patient's rate of progress toward rehab goals is good. Plan of action to help patient continue to work towards rehab goals include cooking and driving. Patient encouraged to continue exercise regimen once discharged from program. Patient will have completed pulmonary after today's session.  Goal(s) in progress: Help patient work toward returning to meaningful activities that improve patient's QOL and are attainable with patient's lung disease

## 2014-08-06 NOTE — Psychosocial Assessment (Signed)
Staff MD, Rehab Director Note/Attestation  Reviewed pshyosocial assessment. Agree with goal  Laid out by the staff of rehab  Dr. Brand Males, M.D., [facility].C.P Pulmonary and Critical Care Medicine Staff Physician and Director of [place] [facility] Pager: [phone], If no answer or between  15:00h - 7:00h: call [phone]  08/06/2014 2:58 PM

## 2014-08-06 NOTE — Progress Notes (Signed)
Deneice completed a Six-Minute Walk Test on 08/05/14 . Hisae walked 937 feet with 1 break lasting 37 seconds.  The patient's lowest oxygen saturation was 94 , highest heart rate was 136 , and highest blood pressure was 158/68. The patient was on 3 liters of oxygen with a nasal cannula. [name] stated that shortness of breath hindered their walk test.

## 2014-08-06 NOTE — Progress Notes (Signed)
Discharge note: Patient has successfully completed all pulmonary rehab sessions. Patient verbalized she is now able to cook some meals for herself and husband. She is also driving to simple destinations and more comfortable handling her O2 in the car. She was very teary during graduation visit verbalizing that she did not want to graduate. Reinforced to patient that she was eligable to attend our self pay maintenance program. Patient stated she was going to talk to her husband, but she was enrolled in a study through her pulmonologist and needed to start maintenance afterwards. Patient regressed on her graduation walk test and was encouraged to continue to exercising at home. She verbalized understanding.

## 2014-08-09 ENCOUNTER — Encounter: Payer: Self-pay | Admitting: Adult Health

## 2014-08-09 ENCOUNTER — Ambulatory Visit (INDEPENDENT_AMBULATORY_CARE_PROVIDER_SITE_OTHER): Payer: Medicare Other | Admitting: Internal Medicine

## 2014-08-09 ENCOUNTER — Ambulatory Visit (INDEPENDENT_AMBULATORY_CARE_PROVIDER_SITE_OTHER): Payer: Medicare Other | Admitting: Adult Health

## 2014-08-09 VITALS — BP 140/70 | HR 80 | Temp 97.4°F | Ht 66.93 in | Wt 133.0 lb

## 2014-08-09 DIAGNOSIS — Z006 Encounter for examination for normal comparison and control in clinical research program: Secondary | ICD-10-CM

## 2014-08-09 DIAGNOSIS — R0602 Shortness of breath: Secondary | ICD-10-CM

## 2014-08-09 LAB — PULMONARY FUNCTION TEST
FEF 25-75 Post: 0.47 L/sec
FEF 25-75 Pre: 0.31 L/sec
FEF2575-%Change-Post: 52 %
FEF2575-%PRED-PRE: 19 %
FEF2575-%Pred-Post: 29 %
FEV1-%Change-Post: 16 %
FEV1-%Pred-Post: 44 %
FEV1-%Pred-Pre: 38 %
FEV1-POST: 1 L
FEV1-Pre: 0.86 L
FEV1FVC-%Change-Post: 5 %
FEV1FVC-%Pred-Pre: 58 %
FEV6-%CHANGE-POST: 14 %
FEV6-%PRED-PRE: 64 %
FEV6-%Pred-Post: 73 %
FEV6-POST: 2.09 L
FEV6-PRE: 1.83 L
FEV6FVC-%CHANGE-POST: 3 %
FEV6FVC-%PRED-POST: 100 %
FEV6FVC-%Pred-Pre: 97 %
FVC-%CHANGE-POST: 10 %
FVC-%PRED-POST: 72 %
FVC-%PRED-PRE: 66 %
FVC-POST: 2.18 L
FVC-Pre: 1.98 L
PRE FEV1/FVC RATIO: 44 %
PRE FEV6/FVC RATIO: 92 %
Post FEV1/FVC ratio: 46 %
Post FEV6/FVC ratio: 96 %

## 2014-08-09 NOTE — Progress Notes (Signed)
  Subjective:    Patient ID: [name]    DOB: June 19, 1932   MRN: [id number]  Brief patient profile:  17 yowf quit smoking 02/2013  referred 02/08/2012 by Glade Lloyd for intermittent sob was on 02 but stopped in early 2013 and with GOLD III COPD documented 03/2012  08/09/2014 IMPACT Screening visit.  This is a Phase III 52, week, randomize, double-blind, 3, arm parallel, group study, compare, the efficacy, safety and  tolerability of the fixed dose triple combination FF/UMEC/VI with the fixed dose dual combinations  FF/VI and UMEC /VI  All administered once daily in the morning via dry powder inhaler in subjects with COPD.   Pt returns for screening visit for IMPACT study.  Pt with known hx of COPD with frequent exacerbations. She is on chronic O2. At 2l/m  She is maintained on Spiriva and Pulmicort/Perforomist  Nebs Says overall she feels her breathing is stable without flare of cough or dyspnea.  PFT today shows FEV1 38% , ratio 44.  Last cxr 7/6 w/ COPD changes w/ no acute ov .      Current Medications, Allergies, Complete Past Medical History, Past Surgical History, Family History, and Social History were reviewed in [employer] record.  ROS  The following are not active complaints unless bolded sore throat, dysphagia, dental problems, itching, sneezing,  nasal congestion or excess/ purulent secretions, ear ache,   fever, chills, sweats, unintended wt loss, classically  pleuritic or exertional cp, hemoptysis,  orthopnea pnd or leg swelling, presyncope, palpitations, heartburn, abdominal pain, anorexia, nausea, vomiting, diarrhea  or change in bowel or urinary habits, change in stools or urine, dysuria,hematuria,  rash, arthralgias, visual complaints, headache, numbness weakness or ataxia or problems with walking or coordination,  change in mood/affect or memory.                   Objective:   Physical Exam   Thin amb wf nad on 02   Wt 97 02/08/2012 >  03/24/2012  99 > 103 05/09/2012 > 06/12/2012  99 >   09/24/2012  96 > 98 01/05/2013 >  106 05/01/2013 > 05/05/2013  99 > 05/15/2013  104 > 06/01/2013 115 > 115 07/07/2013 > 114  07/17/13  > 127 09/18/2013 > 12/01/2013  131 >135 03/04/2014 > 03/12/2014  133 > 05/27/2014  133  > 06/28/2014  133 >131 07/26/2014 >133. 08/09/2014     HEENT mild turbinate edema.  Oropharynx no thrush or excess pnd or cobblestoning.  No JVD or cervical adenopathy. Mild accessory muscle hypertrophy. Trachea midline, nl thryroid. Chest was hyperinflated by percussion with diminished breath sounds and moderate increased exp time without wheeze. Hoover sign positive at mid inspiration. Regular rate and rhythm without murmur gallop or rub or increase P2 edema resolved.  Abd: no hsm, nl excursion. Ext warm without cyanosis or clubbing.      CXR  06/28/2014 :  Findings consistent with chronic obstructive pulmonary disease. No acute cardiopulmonary abnormality seen.         Assessment & Plan:

## 2014-08-09 NOTE — Progress Notes (Signed)
PFT done today. 

## 2014-08-09 NOTE — Assessment & Plan Note (Signed)
IMPACT screening visit completed Physical exam done w/ paperwork completed. -see exam section  No oral candiadis noted.  Pt questions were answered  COPD appears compensated without flare at this visit.  Pt to return per study guidelines.

## 2014-08-09 NOTE — Progress Notes (Signed)
IMPACT COPD study:  Patient comes for Consent and visit 1 screening for the Impact study.  The consent was reviewed, which included, GCP procedures, voluntary status, compensation.  The patient was encouraged to ask questions which she did.  Following a 30 min discussion the patient voluntarily signed consent on this day at [place].  A copy was given to the patient of the original consent.  Study procedures were then started.

## 2014-08-10 ENCOUNTER — Encounter (HOSPITAL_COMMUNITY): Payer: Medicare Other

## 2014-08-12 ENCOUNTER — Encounter (HOSPITAL_COMMUNITY): Payer: Medicare Other

## 2014-08-16 ENCOUNTER — Telehealth: Payer: Self-pay | Admitting: Adult Health

## 2014-08-16 NOTE — Telephone Encounter (Signed)
Spoke with the pt  She states that received a bill from research study  She has since spoke with Hoyle Sauer with research and her questions were answered  Pt states nothing further needed

## 2014-08-17 ENCOUNTER — Encounter (HOSPITAL_COMMUNITY): Payer: Medicare Other

## 2014-08-23 ENCOUNTER — Other Ambulatory Visit: Payer: Self-pay | Admitting: Internal Medicine

## 2014-08-23 DIAGNOSIS — R0602 Shortness of breath: Secondary | ICD-10-CM

## 2014-08-24 ENCOUNTER — Ambulatory Visit (INDEPENDENT_AMBULATORY_CARE_PROVIDER_SITE_OTHER): Payer: Self-pay | Admitting: Adult Health

## 2014-08-24 ENCOUNTER — Ambulatory Visit (INDEPENDENT_AMBULATORY_CARE_PROVIDER_SITE_OTHER): Payer: Self-pay | Admitting: Internal Medicine

## 2014-08-24 ENCOUNTER — Encounter: Payer: Self-pay | Admitting: Adult Health

## 2014-08-24 VITALS — BP 148/90 | HR 110 | Temp 97.4°F | Ht 67.0 in | Wt 133.6 lb

## 2014-08-24 DIAGNOSIS — J441 Chronic obstructive pulmonary disease with (acute) exacerbation: Secondary | ICD-10-CM

## 2014-08-24 DIAGNOSIS — R0602 Shortness of breath: Secondary | ICD-10-CM

## 2014-08-24 DIAGNOSIS — Z006 Encounter for examination for normal comparison and control in clinical research program: Secondary | ICD-10-CM

## 2014-08-24 LAB — PULMONARY FUNCTION TEST
FEF 25-75 POST: 0.37 L/s
FEF 25-75 PRE: 0.31 L/s
FEF2575-%Change-Post: 18 %
FEF2575-%Pred-Post: 22 %
FEF2575-%Pred-Pre: 19 %
FEV1-%Change-Post: 5 %
FEV1-%Pred-Post: 33 %
FEV1-%Pred-Pre: 32 %
FEV1-PRE: 0.72 L
FEV1-Post: 0.75 L
FEV1FVC-%Change-Post: -3 %
FEV1FVC-%Pred-Pre: 55 %
FEV6-%Change-Post: 11 %
FEV6-%PRED-PRE: 58 %
FEV6-%Pred-Post: 65 %
FEV6-POST: 1.86 L
FEV6-Pre: 1.67 L
FEV6FVC-%CHANGE-POST: 1 %
FEV6FVC-%PRED-POST: 101 %
FEV6FVC-%Pred-Pre: 100 %
FVC-%CHANGE-POST: 9 %
FVC-%PRED-PRE: 58 %
FVC-%Pred-Post: 64 %
FVC-PRE: 1.75 L
FVC-Post: 1.92 L
PRE FEV1/FVC RATIO: 41 %
Post FEV1/FVC ratio: 39 %
Post FEV6/FVC ratio: 97 %
Pre FEV6/FVC Ratio: 96 %

## 2014-08-24 MED ORDER — AZITHROMYCIN 250 MG PO TABS: ORAL_TABLET | ORAL | Status: AC

## 2014-08-24 MED ORDER — PREDNISONE 10 MG PO TABS: ORAL_TABLET | ORAL | Status: DC

## 2014-08-24 NOTE — Patient Instructions (Signed)
Zpack take as directed  Prednisone taper over next week.  Mucinex DM Twice daily  As needed  Cough/congestion  Continue with IMPACT study protocol as directed.  Follow up as planned for IMPACT study  Follow up Dr. Melvyn Novas  As planned and As needed

## 2014-08-24 NOTE — Assessment & Plan Note (Signed)
Flare   Plan  Zpack take as directed  Prednisone taper over next week.  Mucinex DM Twice daily  As needed  Cough/congestion

## 2014-08-24 NOTE — Progress Notes (Signed)
  Subjective:    Patient ID: [name]    DOB: Nov 02, 1932   MRN: [id number]  Brief patient profile:  70 yowf quit smoking 02/2013  referred 02/08/2012 by Glade Lloyd for intermittent sob was on 02 but stopped in early 2013 and with GOLD III COPD documented 03/2012  08/24/2014 IMPACT Randomization visit.  This is a Phase III 52, week, randomize, double-blind, 3, arm parallel, group study, compare, the efficacy, safety and  tolerability of the fixed dose triple combination FF/UMEC/VI with the fixed dose dual combinations  FF/VI and UMEC /VI  All administered once daily in the morning via dry powder inhaler in subjects with COPD.   Pt returns for randomization visit for IMPACT study.  Pt with known hx of COPD with frequent exacerbations. She is on chronic O2. At 2l/m She is maintained on Spiriva and Pulmicort/Perforomist  Nebs  PFT today shows FEV1 32% , ratio 41.  Says has not felt good over last 2 days. More short of breath, cough and wheezing. Coughed up yellow green this am. Felt weak and run down.  No leg swelling, fever, chest pain , n/v/d.  Pt has not started study drug at this time, so therefore do not feel this exacerbation is related to the study drug.       Current Medications, Allergies, Complete Past Medical History, Past Surgical History, Family History, and Social History were reviewed in [facility] record.  ROS  The following are not active complaints unless bolded sore throat, dysphagia, dental problems, itching, sneezing,    ear ache,   fever, chills, sweats, unintended wt loss, classically  pleuritic or exertional cp, hemoptysis,  orthopnea pnd or leg swelling, presyncope, palpitations, heartburn, abdominal pain, anorexia, nausea, vomiting, diarrhea  or change in bowel or urinary habits, change in stools or urine, dysuria,hematuria,  rash, arthralgias, visual complaints, headache, numbness weakness or ataxia or problems with walking or coordination,   change in mood/affect or memory.                   Objective:   Physical Exam   Thin amb wf nad on 02   Wt 97 02/08/2012 > 03/24/2012  99 > 103 05/09/2012 > 06/12/2012  99 >   09/24/2012  96 > 98 01/05/2013 >  106 05/01/2013 > 05/05/2013  99 > 05/15/2013  104 > 06/01/2013 115 > 115 07/07/2013 > 114  07/17/13  > 127 09/18/2013 > 12/01/2013  131 >135 03/04/2014 > 03/12/2014  133 > 05/27/2014  133  > 06/28/2014  133 >131 07/26/2014 >133. 08/24/2014     HEENT mild turbinate edema.  Oropharynx no thrush or excess pnd or cobblestoning.  No JVD or cervical adenopathy. Mild accessory muscle hypertrophy. Trachea midline, nl thryroid. Chest was hyperinflated by percussion with diminished breath sounds, faint exp wheeze. Regular rate and rhythm without murmur gallop or rub or increase P2 edema resolved.  Abd: no hsm, nl excursion. Ext warm without cyanosis or clubbing.      CXR  06/28/2014 :  Findings consistent with chronic obstructive pulmonary disease. No acute cardiopulmonary abnormality seen.         Assessment & Plan:

## 2014-08-24 NOTE — Assessment & Plan Note (Signed)
IMPACT Study 08/09/2014  This is a Phase III 52, week, randomize, double-blind, 3, arm parallel, group study, compare, the efficacy, safety and  tolerability of the fixed dose triple combination FF/UMEC/VI with the fixed dose dual combinations  FF/VI and UMEC /VI  All administered once daily in the morning via dry powder inhaler in subjects with COPD.   Screening visit 08/09/2014  Randomization visit 08/24/2014 >w/ COPD flare  Pt has not started study drug at this time, so therefore do not feel this exacerbation is related to the study drug.

## 2014-08-24 NOTE — Progress Notes (Signed)
Chart and ov reviewed and agree with a/p

## 2014-08-24 NOTE — Progress Notes (Signed)
Spirometry pre and post done today. 

## 2014-09-01 ENCOUNTER — Encounter: Payer: Self-pay | Admitting: Family Medicine

## 2014-09-01 ENCOUNTER — Ambulatory Visit (INDEPENDENT_AMBULATORY_CARE_PROVIDER_SITE_OTHER): Payer: Medicare Other | Admitting: Family Medicine

## 2014-09-01 VITALS — BP 124/80 | HR 72 | Wt 135.0 lb

## 2014-09-01 DIAGNOSIS — H6120 Impacted cerumen, unspecified ear: Secondary | ICD-10-CM

## 2014-09-01 DIAGNOSIS — H6122 Impacted cerumen, left ear: Secondary | ICD-10-CM

## 2014-09-01 NOTE — Progress Notes (Signed)
   Subjective:    Patient ID: [name], female    DOB: Mar 22, 1932, 80 y.o.   MRN: [id number]  HPI She is here for evaluation of difficulty hearing mainly on the left side. She does use hearing aids.   Review of Systems     Objective:   Physical Exam Right TM and canal are normal. Left TM is clear. There was some cerumen in the ear which was removed easily.       Assessment & Plan:  Cerumen impaction, left  return as needed.

## 2014-09-20 ENCOUNTER — Other Ambulatory Visit: Payer: Self-pay | Admitting: Internal Medicine

## 2014-09-20 DIAGNOSIS — R0602 Shortness of breath: Secondary | ICD-10-CM

## 2014-09-20 DIAGNOSIS — H1130 Conjunctival hemorrhage, unspecified eye: Secondary | ICD-10-CM | POA: Diagnosis not present

## 2014-09-21 ENCOUNTER — Ambulatory Visit (INDEPENDENT_AMBULATORY_CARE_PROVIDER_SITE_OTHER): Payer: Medicare Other | Admitting: Internal Medicine

## 2014-09-21 ENCOUNTER — Ambulatory Visit (INDEPENDENT_AMBULATORY_CARE_PROVIDER_SITE_OTHER): Payer: Medicare Other | Admitting: Adult Health

## 2014-09-21 VITALS — BP 132/78 | HR 77 | Temp 97.7°F | Wt 139.6 lb

## 2014-09-21 DIAGNOSIS — R0602 Shortness of breath: Secondary | ICD-10-CM

## 2014-09-21 DIAGNOSIS — Z006 Encounter for examination for normal comparison and control in clinical research program: Secondary | ICD-10-CM

## 2014-09-21 LAB — PULMONARY FUNCTION TEST
FEF 25-75 Post: 0.39 L/sec
FEF 25-75 Pre: 0.33 L/sec
FEF2575-%CHANGE-POST: 17 %
FEF2575-%PRED-PRE: 20 %
FEF2575-%Pred-Post: 24 %
FEV1-%CHANGE-POST: 8 %
FEV1-%Pred-Post: 40 %
FEV1-%Pred-Pre: 37 %
FEV1-Post: 0.9 L
FEV1-Pre: 0.83 L
FEV1FVC-%Change-Post: 6 %
FEV1FVC-%Pred-Pre: 60 %
FEV6-%CHANGE-POST: 2 %
FEV6-%PRED-POST: 64 %
FEV6-%PRED-PRE: 62 %
FEV6-PRE: 1.77 L
FEV6-Post: 1.82 L
FEV6FVC-%Change-Post: 1 %
FEV6FVC-%PRED-POST: 101 %
FEV6FVC-%PRED-PRE: 99 %
FVC-%CHANGE-POST: 1 %
FVC-%Pred-Post: 63 %
FVC-%Pred-Pre: 62 %
FVC-Post: 1.89 L
FVC-Pre: 1.86 L
POST FEV6/FVC RATIO: 96 %
Post FEV1/FVC ratio: 47 %
Pre FEV1/FVC ratio: 45 %
Pre FEV6/FVC Ratio: 95 %

## 2014-09-21 NOTE — Patient Instructions (Signed)
Continue with IMPACT study protocol as directed.  Follow up as planned for IMPACT study  Follow up Dr. Melvyn Novas  In 6 weeks and As needed   Get Flu shot this week if possible

## 2014-09-21 NOTE — Progress Notes (Signed)
Spirometry pre and post done today. 

## 2014-09-21 NOTE — Assessment & Plan Note (Addendum)
IMPACT Study Visit #3  COPD -doing well on study drug without flare  Pt education done   Plan  Continue with IMPACT study protocol as directed.  Follow up as planned for IMPACT study  Follow up Dr. Melvyn Novas  In 6 weeks and As needed   Get Flu shot this week if possible

## 2014-09-21 NOTE — Progress Notes (Signed)
  Subjective:    Patient ID: [name]    DOB: June 01, 1932   MRN: [id number]  Brief patient profile:  40 yowf quit smoking 02/2013  referred 02/08/2012 by Glade Lloyd for intermittent sob was on 02 but stopped in early 2013 and with GOLD III COPD documented 03/2012  09/21/2014 IMPACT Visit 3  This is a Phase III 52, week, randomize, double-blind, 3, arm parallel, group study, compare, the efficacy, safety and  tolerability of the fixed dose triple combination FF/UMEC/VI with the fixed dose dual combinations  FF/VI and UMEC /VI  All administered once daily in the morning via dry powder inhaler in subjects with COPD.   Pt returns for Visit #3  for IMPACT study.  Pt with known hx of COPD with frequent exacerbations. She is on chronic O2. At 2l/m She was maintained on Spiriva and Pulmicort/Perforomist  Nebs Randomization visit was 9/1 . Now on study drug.   PFT today shows FEV1 37% , ratio 45 Says overall she is doing well with no new complaints.   Decreased SABA use with only 2 uses of Albuterol since last visit. This is decreased for her.  No leg swelling, fever, chest pain , n/v/d, thrush or fever.       Current Medications, Allergies, Complete Past Medical History, Past Surgical History, Family History, and Social History were reviewed in [facility] record.  ROS  The following are not active complaints unless bolded sore throat, dysphagia, dental problems, itching, sneezing,    ear ache,   fever, chills, sweats, unintended wt loss, classically  pleuritic or exertional cp, hemoptysis,  orthopnea pnd or leg swelling, presyncope, palpitations, heartburn, abdominal pain, anorexia, nausea, vomiting, diarrhea  or change in bowel or urinary habits, change in stools or urine, dysuria,hematuria,  rash, arthralgias, visual complaints, headache, numbness weakness or ataxia or problems with walking or coordination,  change in mood/affect or memory.                    Objective:   Physical Exam   Thin amb wf nad on 02   Wt 97 02/08/2012 > 03/24/2012  99 > 103 05/09/2012 > 06/12/2012  99 >   09/24/2012  96 > 98 01/05/2013 >  106 05/01/2013 > 05/05/2013  99 > 05/15/2013  104 > 06/01/2013 115 > 115 07/07/2013 > 114  07/17/13  > 127 09/18/2013 > 12/01/2013  131 >135 03/04/2014 > 03/12/2014  133 > 05/27/2014  133  > 06/28/2014  133 >131 07/26/2014 >133 09/21/2014 139     HEENT mild turbinate edema.  Oropharynx no thrush or excess pnd or cobblestoning.  No JVD or cervical adenopathy. Mild accessory muscle hypertrophy. Trachea midline, nl thryroid. Chest was hyperinflated by percussion with diminished breath sounds, faint exp wheeze. Regular rate and rhythm without murmur gallop or rub or increase P2 edema resolved.  Abd: no hsm, nl excursion. Ext warm without cyanosis or clubbing.      CXR  06/28/2014 :  Findings consistent with chronic obstructive pulmonary disease. No acute cardiopulmonary abnormality seen.         Assessment & Plan:

## 2014-09-22 ENCOUNTER — Other Ambulatory Visit (INDEPENDENT_AMBULATORY_CARE_PROVIDER_SITE_OTHER): Payer: Medicare Other

## 2014-09-22 DIAGNOSIS — Z23 Encounter for immunization: Secondary | ICD-10-CM

## 2014-10-26 ENCOUNTER — Other Ambulatory Visit: Payer: Self-pay | Admitting: Adult Health

## 2014-10-26 ENCOUNTER — Telehealth: Payer: Self-pay | Admitting: Internal Medicine

## 2014-10-26 MED ORDER — PREDNISONE 10 MG PO TABS: ORAL_TABLET | ORAL | Status: DC

## 2014-10-26 MED ORDER — AZITHROMYCIN 250 MG PO TABS: ORAL_TABLET | ORAL | Status: DC

## 2014-10-26 NOTE — Telephone Encounter (Signed)
Called and spoke with pt and she stated that she sent in refill requests for zpak and prednisone taper to keep on hand.  Pt stated that she is not having any problems at this time.  MW please advise if ok to send in these refills. Thanks  Allergies  Allergen Reactions  . Augmentin [Amoxicillin-Pot Clavulanate] Nausea And Vomiting  . Levofloxacin Other (See Comments)    Lowered blood pressre    Current Outpatient Prescriptions on File Prior to Visit  Medication Sig Dispense Refill  . albuterol (PROVENTIL HFA;VENTOLIN HFA) 108 (90 BASE) MCG/ACT inhaler Inhale 2 puffs into the lungs every 4 (four) hours as needed for wheezing or shortness of breath (((PLAN A))).    Marland Kitchen albuterol (PROVENTIL) (2.5 MG/3ML) 0.083% nebulizer solution Take 2.5 mg by nebulization every 4 (four) hours as needed for wheezing or shortness of breath (((PLAN B))).    . budesonide (PULMICORT) 0.25 MG/2ML nebulizer solution Take 2 mLs (0.25 mg total) by nebulization 2 (two) times daily. DX 496 120 mL 6  . Calcium Carbonate (CALCIUM 600 PO) Take 1 tablet by mouth daily.    . cetirizine (ZYRTEC) 10 MG tablet Take 10 mg by mouth at bedtime as needed for allergies.    . Cholecalciferol (VITAMIN D-3) 1000 UNITS CAPS Take 1 capsule by mouth every morning.    . clonazePAM (KLONOPIN) 0.5 MG tablet 1/2 tab by mouth at bedtime as needed for anxiety/sleep    . dextromethorphan-guaiFENesin (MUCINEX DM) 30-600 MG per 12 hr tablet Take 1-2 tablets by mouth every 12 (twelve) hours as needed (with flutter).     . famotidine (PEPCID) 20 MG tablet Take 20 mg by mouth 2 (two) times daily.     . formoterol (PERFOROMIST) 20 MCG/2ML nebulizer solution USE 1 VIAL VIA NEBULIZER TWICE DAILY 120 mL 5  . predniSONE (DELTASONE) 10 MG tablet 4 tabs for 2 days, then 3 tabs for 2 days, 2 tabs for 2 days, then 1 tab for 2 days, then stop 20 tablet 0  . Probiotic Product (PROBIOTIC DAILY PO) Per box as needed for diarrhea    . SPIRIVA HANDIHALER 18 MCG  inhalation capsule INHALE CONTENTS OF 1 CAPSULE BY MOUTH USING HANDIHALER DEVICE DAILY 30 capsule 2   Current Facility-Administered Medications on File Prior to Visit  Medication Dose Route Frequency Provider Last Rate Last Dose  . influenza  inactive virus vaccine (FLUZONE/FLUARIX) injection 0.5 mL  0.5 mL Intramuscular Once Denita Lung, MD

## 2014-10-26 NOTE — Telephone Encounter (Signed)
Called pt. Aware RX's sent in. Nothing further needed

## 2014-10-26 NOTE — Telephone Encounter (Signed)
Ok  Prednisone 10 mg take  4 each am x 2 days,   2 each am x 2 days,  1 each am x 2 days and stop  zpak No refills

## 2014-11-02 ENCOUNTER — Ambulatory Visit: Payer: Medicare Other | Admitting: Internal Medicine

## 2014-11-02 DIAGNOSIS — H4011X1 Primary open-angle glaucoma, mild stage: Secondary | ICD-10-CM | POA: Diagnosis not present

## 2014-11-04 ENCOUNTER — Ambulatory Visit: Payer: Medicare Other | Admitting: Internal Medicine

## 2014-11-04 ENCOUNTER — Encounter: Payer: Self-pay | Admitting: Internal Medicine

## 2014-11-04 ENCOUNTER — Ambulatory Visit (INDEPENDENT_AMBULATORY_CARE_PROVIDER_SITE_OTHER): Payer: Medicare Other | Admitting: Internal Medicine

## 2014-11-04 VITALS — BP 160/88 | HR 81 | Temp 97.8°F | Ht 67.0 in | Wt 140.4 lb

## 2014-11-04 DIAGNOSIS — Z006 Encounter for examination for normal comparison and control in clinical research program: Secondary | ICD-10-CM | POA: Diagnosis not present

## 2014-11-04 DIAGNOSIS — J432 Centrilobular emphysema: Secondary | ICD-10-CM

## 2014-11-04 DIAGNOSIS — J9612 Chronic respiratory failure with hypercapnia: Secondary | ICD-10-CM

## 2014-11-04 NOTE — Progress Notes (Signed)
d  Subjective:    Patient ID: [name]    DOB: [birth year]   MRN: [id number]  Brief patient profile:  64 yowf quit smoking 02/2013  referred 02/08/2012 by Glade Lloyd for intermittent sob was on 02 but stopped in early 2013 and with GOLD III COPD documented 03/2012  09/21/2014 IMPACT Visit 3  This is a Phase III 52, week, randomize, double-blind, 3, arm parallel, group study, compare, the efficacy, safety and  tolerability of the fixed dose triple combination FF/UMEC/VI with the fixed dose dual combinations  FF/VI and UMEC /VI  All administered once daily in the morning via dry powder inhaler in subjects with COPD.   Pt returns for Visit #3  for IMPACT study.  Pt with known hx of COPD with frequent exacerbations. She is on chronic O2. At 2l/m She was maintained on Spiriva and Pulmicort/Perforomist  Nebs Randomization visit was 9/1 . Now on study drug.   PFT today shows FEV1 37% , ratio 45 Says overall she is doing well with no new complaints.   Decreased SABA use with only 2 uses of Albuterol since last visit. This is decreased for her.  No leg swelling, fever, chest pain , n/v/d, thrush or fever.  rec Continue with IMPACT study protocol as directed.  Follow up as planned for IMPACT study  Follow up Dr. Melvyn Novas  In 6 weeks and As needed     11/04/2014 f/u ov/[name] re: COPD GOLDIII, not longer keepng up with med calendar  Chief Complaint  Patient presents with  . Follow-up    Pt states that her breathing is doing well. She is only using study drug- has stopped nebs and spiriva.      Not limited by breathing from desired activities  On 02, very slow pace, very sedentary No need for saba   No obvious day to day or daytime variabilty or assoc chronic cough or cp or chest tightness, subjective wheeze overt sinus or hb symptoms. No unusual exp hx or h/o childhood pna/ asthma or knowledge of premature birth.  Sleeping ok without nocturnal  or early am exacerbation  of respiratory  c/o's or  need for noct saba. Also denies any obvious fluctuation of symptoms with weather or environmental changes or other aggravating or alleviating factors except as outlined above   Current Medications, Allergies, Complete Past Medical History, Past Surgical History, Family History, and Social History were reviewed in [facility] record.  ROS  The following are not active complaints unless bolded sore throat, dysphagia, dental problems, itching, sneezing,  nasal congestion or excess/ purulent secretions, ear ache,   fever, chills, sweats, unintended wt loss, pleuritic or exertional cp, hemoptysis,  orthopnea pnd or leg swelling, presyncope, palpitations, heartburn, abdominal pain, anorexia, nausea, vomiting, diarrhea  or change in bowel or urinary habits, change in stools or urine, dysuria,hematuria,  rash, arthralgias, visual complaints, headache, numbness weakness or ataxia or problems with walking or coordination,  change in mood/affect or memory.          Objective:   Physical Exam   Thin amb wf nad on 02    Wt 97 02/08/2012 > 03/24/2012  99 > 103 05/09/2012 > 06/12/2012  99 >   09/24/2012  96 > 98 01/05/2013 >  106 05/01/2013 > 05/05/2013  99 > 05/15/2013  104 > 06/01/2013 115 > 115 07/07/2013 > 114  07/17/13  > 127 09/18/2013 > 12/01/2013  131 >135 03/04/2014 > 03/12/2014  133 > 05/27/2014  133  >  06/28/2014  133 >131 07/26/2014 >133 09/21/2014 139 > 11/04/2014      HEENT mild turbinate edema.  Oropharynx no thrush or excess pnd or cobblestoning.  No JVD or cervical adenopathy. Mild accessory muscle hypertrophy. Trachea midline, nl thryroid. Chest was hyperinflated by percussion with diminished breath sounds, faint exp wheeze. Regular rate and rhythm without murmur gallop or rub or increase P2 edema resolved.  Abd: no hsm, nl excursion. Ext warm without cyanosis or clubbing.      CXR  06/28/2014 :  Findings consistent with chronic obstructive pulmonary disease. No acute cardiopulmonary  abnormality seen.         Assessment & Plan:

## 2014-11-04 NOTE — Patient Instructions (Signed)

## 2014-11-05 ENCOUNTER — Ambulatory Visit: Payer: Medicare Other | Admitting: Internal Medicine

## 2014-11-06 NOTE — Assessment & Plan Note (Signed)
-   PFTs 03/24/2012  FEV1  0.91 (43%) ratio 48 and no better p B2,  DLCO 38% corrects to 53%  - changed to perforomist/bud bid 04/2013  -2015 pulmonary rehab  - med calendar 07/17/13 > did not bring 06/28/2014 , .redone 07/26/2014  - 06/28/2014 p extensive coaching HFA effectiveness =    75% (short Ti) - 08/24/14 started IMPACT trial   Clearly better on trial med than previously, using elipta device well    Each maintenance medication was reviewed in detail including most importantly the difference between maintenance and as needed and under what circumstances the prns are to be used. This was done in the context of a medication calendar review which provided the patient with a user-friendly unambiguous mechanism for medication administration and reconciliation and provides an action plan for all active problems. It is critical that this be shown to every doctor  for modification during the office visit if necessary so the patient can use it as a working document.

## 2014-11-06 NOTE — Assessment & Plan Note (Signed)
-   ono RA 05/14/12   4 h 33 min < 89%   So ordered 02 2lpm    - HCO3  32 05/10/13    - 01/05/2013   Walked RA x one lap @ 185 stopped due to  88%   - 01/05/2013  Walked 2lpm 2 laps @ 185 ft each stopped due to  90% sob but improved vs baseline   - RA 05/01/2013 sats = 87%    Rx= 02 2lpm 24/7  As of 07/07/13   Adequate control on present rx, reviewed > no change in rx needed

## 2014-11-15 ENCOUNTER — Ambulatory Visit: Payer: Medicare Other | Admitting: Adult Health

## 2014-11-17 DIAGNOSIS — H4011X2 Primary open-angle glaucoma, moderate stage: Secondary | ICD-10-CM | POA: Diagnosis not present

## 2014-11-23 ENCOUNTER — Other Ambulatory Visit: Payer: Self-pay | Admitting: Internal Medicine

## 2014-11-23 NOTE — Telephone Encounter (Signed)
Received refill request from pharm for pred taper and zpack  Spoke with the pt  She is c/o increased SOB and cough x 2 days  She is coughing up minimal clear sputum and states "I don't feel bad, but I don't feel good either"  I spoke with MW and he is okay with these refills  Rxs were sent  Pt aware and will call if not improving or worsens

## 2014-12-06 ENCOUNTER — Telehealth: Payer: Self-pay | Admitting: Internal Medicine

## 2014-12-06 NOTE — Telephone Encounter (Signed)
Form placed in MW's lookat to be signed    

## 2014-12-06 NOTE — Telephone Encounter (Signed)
Forms completed and faxed to [facility]  Pt aware  Copies are up front for the pt's sister to pick up

## 2014-12-10 ENCOUNTER — Other Ambulatory Visit: Payer: Self-pay | Admitting: Internal Medicine

## 2014-12-10 DIAGNOSIS — R0600 Dyspnea, unspecified: Secondary | ICD-10-CM

## 2014-12-13 ENCOUNTER — Ambulatory Visit (INDEPENDENT_AMBULATORY_CARE_PROVIDER_SITE_OTHER): Payer: Medicare Other | Admitting: Adult Health

## 2014-12-13 ENCOUNTER — Ambulatory Visit (INDEPENDENT_AMBULATORY_CARE_PROVIDER_SITE_OTHER): Payer: Medicare Other | Admitting: Internal Medicine

## 2014-12-13 ENCOUNTER — Encounter: Payer: Self-pay | Admitting: Adult Health

## 2014-12-13 VITALS — BP 132/82 | HR 68 | Temp 98.7°F | Ht 67.0 in | Wt 141.0 lb

## 2014-12-13 DIAGNOSIS — Z006 Encounter for examination for normal comparison and control in clinical research program: Secondary | ICD-10-CM

## 2014-12-13 DIAGNOSIS — R0600 Dyspnea, unspecified: Secondary | ICD-10-CM

## 2014-12-13 LAB — PULMONARY FUNCTION TEST
FEF 25-75 POST: 0.33 L/s
FEF 25-75 Pre: 0.32 L/sec
FEF2575-%Change-Post: 5 %
FEF2575-%Pred-Post: 21 %
FEF2575-%Pred-Pre: 20 %
FEV1-%CHANGE-POST: 5 %
FEV1-%PRED-POST: 38 %
FEV1-%Pred-Pre: 36 %
FEV1-POST: 0.85 L
FEV1-PRE: 0.81 L
FEV1FVC-%Change-Post: 4 %
FEV1FVC-%Pred-Pre: 58 %
FEV6-%CHANGE-POST: 0 %
FEV6-%PRED-PRE: 62 %
FEV6-%Pred-Post: 63 %
FEV6-POST: 1.78 L
FEV6-PRE: 1.77 L
FEV6FVC-%Change-Post: 0 %
FEV6FVC-%PRED-PRE: 98 %
FEV6FVC-%Pred-Post: 98 %
FVC-%Change-Post: 0 %
FVC-%Pred-Post: 63 %
FVC-%Pred-Pre: 63 %
FVC-Post: 1.9 L
FVC-Pre: 1.89 L
Post FEV1/FVC ratio: 45 %
Post FEV6/FVC ratio: 94 %
Pre FEV1/FVC ratio: 43 %
Pre FEV6/FVC Ratio: 94 %

## 2014-12-13 NOTE — Patient Instructions (Signed)
Follow-up per research protocol 

## 2014-12-13 NOTE — Assessment & Plan Note (Signed)
Visit #4 for IMPACT study.   Doing well without complaints. No sign of oral candidiasis

## 2014-12-13 NOTE — Progress Notes (Signed)
IMPACT VISIT 4:  Patient returns, no new complaints or AE's, is taking study medications as directed.  Will be assessed for oral thrush by Tammy P.  Given updated study drug and will return in March for Visit 5.  Overall patient is doing well and is compliant to all study procedures.  Encouraged to call with any new questions or concerns.

## 2014-12-13 NOTE — Progress Notes (Signed)
   Subjective:    Patient ID: [name], female    DOB: 01-20-1932, 80 y.o.   MRN: [id number]  HPI    Review of Systems     Objective:   Physical Exam        Assessment & Plan:    Subjective:    Patient ID: [name]    DOB: 1932-10-08   MRN: [id number]  Brief patient profile:  49 yowf quit smoking 02/2013  referred 02/08/2012 by Glade Lloyd for intermittent sob was on 02 but stopped in early 2013 and with GOLD III COPD documented 03/2012  12/13/2014 IMPACT Visit  This is a Phase III 52, week, randomize, double-blind, 3, arm parallel, group study, compare, the efficacy, safety and  tolerability of the fixed dose triple combination FF/UMEC/VI with the fixed dose dual combinations  FF/VI and UMEC /VI  All administered once daily in the morning via dry powder inhaler in subjects with COPD.   Pt returns for Visit #4  for IMPACT study.  Pt with known hx of COPD with frequent exacerbations. She is on chronic O2. At 2l/m Doing well without complaints.     Current Medications, Allergies, Complete Past Medical History, Past Surgical History, Family History, and Social History were reviewed in [facility] record.      Objective:   Physical Exam   Thin amb wf nad on 02    HEENT mild turbinate edema.  Oropharynx no thrush        Assessment & Plan:

## 2014-12-13 NOTE — Progress Notes (Signed)
Spirometry before and after done today. 

## 2014-12-27 ENCOUNTER — Telehealth: Payer: Self-pay | Admitting: Internal Medicine

## 2014-12-27 NOTE — Telephone Encounter (Signed)
Message given to [employer] in research to call pt.

## 2015-02-07 ENCOUNTER — Other Ambulatory Visit: Payer: Self-pay | Admitting: Internal Medicine

## 2015-02-08 NOTE — Telephone Encounter (Signed)
LMTCB

## 2015-02-09 NOTE — Telephone Encounter (Signed)
Spoke with the pt  She requested that we refill her zpack and pred taper  She states "I just don't feel good" She states "I don't know what it is" She was coughing up minimal clear sputum yesterday, and took some mucinex and this helped  She denies any increased SOB or wheezing  Dr Melvyn Novas, please advise thanks!

## 2015-03-09 ENCOUNTER — Other Ambulatory Visit: Payer: Self-pay | Admitting: Internal Medicine

## 2015-03-09 DIAGNOSIS — R0600 Dyspnea, unspecified: Secondary | ICD-10-CM

## 2015-03-10 ENCOUNTER — Ambulatory Visit (INDEPENDENT_AMBULATORY_CARE_PROVIDER_SITE_OTHER): Payer: Medicare Other | Admitting: Adult Health

## 2015-03-10 ENCOUNTER — Encounter: Payer: Self-pay | Admitting: Adult Health

## 2015-03-10 ENCOUNTER — Ambulatory Visit (INDEPENDENT_AMBULATORY_CARE_PROVIDER_SITE_OTHER): Payer: Medicare Other | Admitting: Internal Medicine

## 2015-03-10 VITALS — BP 136/78 | HR 97 | Temp 97.7°F | Resp 19 | Wt 138.0 lb

## 2015-03-10 DIAGNOSIS — R0600 Dyspnea, unspecified: Secondary | ICD-10-CM

## 2015-03-10 DIAGNOSIS — Z006 Encounter for examination for normal comparison and control in clinical research program: Secondary | ICD-10-CM

## 2015-03-10 LAB — PULMONARY FUNCTION TEST
FEF 25-75 Post: 0.41 L/sec
FEF 25-75 Pre: 0.36 L/sec
FEF2575-%CHANGE-POST: 12 %
FEF2575-%Pred-Post: 25 %
FEF2575-%Pred-Pre: 23 %
FEV1-%Change-Post: 1 %
FEV1-%PRED-PRE: 43 %
FEV1-%Pred-Post: 44 %
FEV1-Post: 0.99 L
FEV1-Pre: 0.97 L
FEV1FVC-%Change-Post: 0 %
FEV1FVC-%Pred-Pre: 62 %
FEV6-%Change-Post: 1 %
FEV6-%PRED-POST: 71 %
FEV6-%Pred-Pre: 70 %
FEV6-POST: 2 L
FEV6-PRE: 1.98 L
FEV6FVC-%Change-Post: 0 %
FEV6FVC-%PRED-POST: 99 %
FEV6FVC-%PRED-PRE: 100 %
FVC-%Change-Post: 0 %
FVC-%PRED-PRE: 70 %
FVC-%Pred-Post: 71 %
FVC-PRE: 2.1 L
FVC-Post: 2.11 L
PRE FEV1/FVC RATIO: 46 %
PRE FEV6/FVC RATIO: 95 %
Post FEV1/FVC ratio: 47 %
Post FEV6/FVC ratio: 95 %

## 2015-03-10 NOTE — Assessment & Plan Note (Signed)
[facility] IMPACT research study: blinded >/= 52 week randomized trial of ANORO equivalent v BREO equivalent v GSK triple MDI (ICS + LABA + LAMA). Sponsored by [place] protocol number: [id number]   This visit 03/10/2015 is a research Visit and is for purpose of follow up and is number 5 on PROTOCOL  Cont per research protcolol .

## 2015-03-10 NOTE — Progress Notes (Signed)
   Subjective:    Patient ID: [name], female    DOB: [birth year], 81 y.o.   MRN: [id number]  HPI [facility] IMPACT research study: blinded >/= 52 week randomized trial of ANORO equivalent v BREO equivalent v GSK triple MDI (ICS + LABA + LAMA). Sponsored by [place] protocol number: [id number]   This visit 03/10/2015 is a research Visit and is for purpose of follow up and is number 5 on PROTOCOL  Pt says she is doing well with no reported flare of cough or wheezing.  No hemoptysis , chest pain or edema   Review of Systems See HPI     Objective:   Physical Exam GEN: A/Ox3; pleasant , frail elderly   HEENT:  [place]/AT,  NOSE-clear, THROAT-clear, no lesions,no thrush   NECK:  Supple w/ fair ROM; no JVD; normal carotid impulses w/o bruits; no thyromegaly or nodules palpated; no lymphadenopathy.  RESP  Decreased BS in bases no accessory muscle use, no dullness to percussion  CARD:  RRR, no m/r/g  , no peripheral edema, pulses intact, no cyanosis or clubbing.  GI:   Soft & nt; nml bowel sounds; no organomegaly or masses detected.  Musco: Warm bil, no deformities or joint swelling noted.   Neuro: alert, no focal deficits noted.    Skin: Warm, no lesions or rashes         Assessment & Plan:

## 2015-03-10 NOTE — Progress Notes (Signed)
Spirometry pre and post done today. 

## 2015-03-10 NOTE — Patient Instructions (Signed)
Per research protocol

## 2015-03-10 NOTE — Progress Notes (Signed)
[facility] IMPACT research study: blinded >/= 52 week randomized trial of ANORO equivalent v BREO equivalent v GSK triple MDI (ICS + LABA + LAMA). Sponsored by [place] protocol number: [id number]   This visit 03/10/2015 is a research  Visit and is for purpose of follow up and is number 5 on PROTOCOL

## 2015-04-05 ENCOUNTER — Encounter: Payer: Self-pay | Admitting: Podiatry

## 2015-04-05 ENCOUNTER — Ambulatory Visit (INDEPENDENT_AMBULATORY_CARE_PROVIDER_SITE_OTHER): Payer: Medicare Other | Admitting: Podiatry

## 2015-04-05 VITALS — BP 141/85 | HR 83 | Resp 18 | Ht 67.0 in | Wt 138.0 lb

## 2015-04-05 DIAGNOSIS — B351 Tinea unguium: Secondary | ICD-10-CM | POA: Diagnosis not present

## 2015-04-05 DIAGNOSIS — M79606 Pain in leg, unspecified: Secondary | ICD-10-CM | POA: Diagnosis not present

## 2015-04-05 NOTE — Progress Notes (Signed)
   Subjective:    Patient ID: [name], female    DOB: [birth year], 81 y.o.   MRN: [id number]  HPI Comments: Pt complains of thickened, discolored B/L 1st toenails.     Review of Systems  Respiratory:       Pt states she is in a study for a inhaler - Pulmonix.  All other systems reviewed and are negative.      Objective:   Physical Exam        Assessment & Plan:

## 2015-04-06 NOTE — Progress Notes (Signed)
Subjective:     Patient ID: [name], female   DOB: 03/16/1932, 81 y.o.   MRN: [id number]  HPI patient presents concerned because her nails that become very thickened they become painful and she cannot cut them. She is in poor health cannot reach her toes and has a history of COPD and is on oxygen   Review of Systems  All other systems reviewed and are negative.      Objective:   Physical Exam  Constitutional: She is oriented to person, place, and time.  Cardiovascular: Intact distal pulses.   Musculoskeletal: Normal range of motion.  Neurological: She is oriented to person, place, and time.  Skin: Skin is warm and dry.  Nursing note and vitals reviewed.  patient presents with nail disease 1-5 both feet with thick dystrophic nails that she cannot cut. She is noted to have diminished pulses but intact with diminished hair growth thin skin and mild reduction of neurological sensation. The nailbeds are painful when palpated and becoming increasingly difficult for her to deal with     Assessment:     At risk patient with severe mycotic nail infection 1-5 both feet with pain    Plan:     Debride painful nailbeds 1-5 both feet and did initial H&P explaining condition to her

## 2015-04-08 ENCOUNTER — Other Ambulatory Visit: Payer: Self-pay | Admitting: Internal Medicine

## 2015-04-08 NOTE — Telephone Encounter (Signed)
This was okayed by Dr Melvyn Novas and sent

## 2015-04-14 ENCOUNTER — Ambulatory Visit (INDEPENDENT_AMBULATORY_CARE_PROVIDER_SITE_OTHER): Payer: Medicare Other | Admitting: Internal Medicine

## 2015-04-14 ENCOUNTER — Encounter: Payer: Self-pay | Admitting: Internal Medicine

## 2015-04-14 VITALS — BP 142/72 | HR 74 | Ht 67.0 in | Wt 138.2 lb

## 2015-04-14 DIAGNOSIS — J432 Centrilobular emphysema: Secondary | ICD-10-CM

## 2015-04-14 DIAGNOSIS — J9612 Chronic respiratory failure with hypercapnia: Secondary | ICD-10-CM

## 2015-04-14 NOTE — Patient Instructions (Addendum)
See calendar for specific medication instructions and bring it back for each and every office visit for every healthcare provider you see.  Without it,  you may not receive the best quality medical care that we feel you deserve.  You will note that the calendar groups together  your maintenance  medications that are timed at particular times of the day.  Think of this as your checklist for what your doctor has instructed you to do until your next evaluation to see what benefit  there is  to staying on a consistent group of medications intended to keep you well.  The other group at the bottom is entirely up to you to use as you see fit  for specific symptoms that may arise between visits that require you to treat them on an as needed basis.  Think of this as your action plan or "what if" list.   Separating the top medications from the bottom group is fundamental to providing you adequate care going forward  When you come off the study, please see Tammy 2 weeks later for a new med calendar and she will arrange follow with me

## 2015-04-14 NOTE — Progress Notes (Signed)
d  Subjective:    Patient ID: [name]    DOB: [birth year]   MRN: [id number]  Brief patient profile:  57 yowf quit smoking 02/2013  referred 02/08/2012 by Glade Lloyd for intermittent sob was on 02 but stopped in early 2013 and with GOLD III COPD documented 03/2012   History of Present Illness  11/04/2014 f/u ov/Guillermo Nehring re: COPD GOLDIII, not longer keepng up with med calendar  Chief Complaint  Patient presents with  . Follow-up    Pt states that her breathing is doing well. She is only using study drug- has stopped nebs and spiriva.     Not limited by breathing from desired activities  On 02, very slow pace, very sedentary No need for saba  rec Follow med calendar    04/14/2015 f/u ov/Rico Massar re: GOLD III copd/ 02 dep/  just finished pred on day of ov for "aecopd"  = nose running, some cough, fatigue  Chief Complaint  Patient presents with  . Follow-up    Pt states that her breathing has been doing well. No new co's today.    2lpm 24/7,  Not limited by breathing from desired activities  Though quite sedentary   No obvious day to day or daytime variabilty or assoc chronic cough or cp or chest tightness, subjective wheeze overt sinus or hb symptoms. No unusual exp hx or h/o childhood pna/ asthma or knowledge of premature birth.  Sleeping ok without nocturnal  or early am exacerbation  of respiratory  c/o's or need for noct saba. Also denies any obvious fluctuation of symptoms with weather or environmental changes or other aggravating or alleviating factors except as outlined above   Current Medications, Allergies, Complete Past Medical History, Past Surgical History, Family History, and Social History were reviewed in [employer] record.  ROS  The following are not active complaints unless bolded sore throat, dysphagia, dental problems, itching, sneezing,  nasal congestion or excess/ purulent secretions, ear ache,   fever, chills, sweats, unintended wt loss, pleuritic  or exertional cp, hemoptysis,  orthopnea pnd or leg swelling, presyncope, palpitations, heartburn, abdominal pain, anorexia, nausea, vomiting, diarrhea  or change in bowel or urinary habits, change in stools or urine, dysuria,hematuria,  rash, arthralgias, visual complaints, headache, numbness weakness or ataxia or problems with walking or coordination,  change in mood/affect or memory.          Objective:   Physical Exam   Thin amb wf nad on 02  2lpm    Wt 97 02/08/2012 > 03/24/2012  99 > 103 05/09/2012 > 06/12/2012  99 >   09/24/2012  96 > 98 01/05/2013 >  106 05/01/2013 > 05/05/2013  99 > 05/15/2013  104 > 06/01/2013 115 > 115 07/07/2013 > 114  07/17/13 > 127 09/18/2013 > 12/01/2013  131 >135 03/04/2014 > 03/12/2014  133 > 05/27/2014  133  > 06/28/2014  133 >131 07/26/2014 >133 09/21/2014 139 >  04/14/2015  138      HEENT mild turbinate edema.  Oropharynx no thrush or excess pnd or cobblestoning.  No JVD or cervical adenopathy. Mild accessory muscle hypertrophy. Trachea midline, nl thryroid. Chest was hyperinflated by percussion with diminished breath sounds, faint exp wheeze. Regular rate and rhythm without murmur gallop or rub or increase P2 edema resolved.  Abd: no hsm, nl excursion. Ext warm without cyanosis or clubbing.      CXR  06/28/2014 : Findings consistent with chronic obstructive pulmonary disease. No acute cardiopulmonary abnormality seen.  Assessment & Plan:

## 2015-04-15 ENCOUNTER — Encounter: Payer: Self-pay | Admitting: Internal Medicine

## 2015-04-15 NOTE — Assessment & Plan Note (Signed)
-   ono RA 05/14/12   4 h 33 min < 89%   So ordered 02 2lpm    - HCO3  32 05/10/13    - 01/05/2013   Walked RA x one lap @ 185 stopped due to  88%   - 01/05/2013  Walked 2lpm 2 laps @ 185 ft each stopped due to  90% sob but improved vs baseline   - RA 05/01/2013 sats = 87%    Rx= 02 2lpm 24/7  As of 04/14/15 adequate/  reviewed

## 2015-04-15 NOTE — Assessment & Plan Note (Signed)
-   PFTs 03/24/2012  FEV1  0.91 (43%) ratio 48 and no better p B2,  DLCO 38% corrects to 53%  - changed to perforomist/bud bid 04/2013  -2015 pulmonary rehab  - med calendar 07/17/13 > did not bring 06/28/2014 , .redone 07/26/2014  - 06/28/2014 p extensive coaching HFA effectiveness =    75% (short Ti) - 08/24/14 started IMPACT trial  - PFTs 12/13/14  FEV1 0.85 (38%) ratio 45  p 5 % improvement from saba   She is relatively stable on current rx but note she's using freq courses of systemic steroids for flares and the frequency has not changed on the study drugs  I had an extended discussion with the patient reviewing all relevant studies completed to date and  lasting 15 to 20 minutes of a 25 minute visit on the following ongoing concerns:     Each maintenance medication was reviewed in detail including most importantly the difference between maintenance and as needed and under what circumstances the prns are to be used. This was done in the context of a medication calendar review which provided the patient with a user-friendly unambiguous mechanism for medication administration and reconciliation and provides an action plan for all active problems. It is critical that this be shown to every doctor  for modification during the office visit if necessary so the patient can use it as a working document.

## 2015-05-10 DIAGNOSIS — L821 Other seborrheic keratosis: Secondary | ICD-10-CM | POA: Diagnosis not present

## 2015-05-10 DIAGNOSIS — L570 Actinic keratosis: Secondary | ICD-10-CM | POA: Diagnosis not present

## 2015-05-10 DIAGNOSIS — D2239 Melanocytic nevi of other parts of face: Secondary | ICD-10-CM | POA: Diagnosis not present

## 2015-05-10 DIAGNOSIS — L820 Inflamed seborrheic keratosis: Secondary | ICD-10-CM | POA: Diagnosis not present

## 2015-06-06 ENCOUNTER — Other Ambulatory Visit: Payer: Self-pay | Admitting: Internal Medicine

## 2015-06-06 DIAGNOSIS — R0600 Dyspnea, unspecified: Secondary | ICD-10-CM

## 2015-06-08 ENCOUNTER — Ambulatory Visit (INDEPENDENT_AMBULATORY_CARE_PROVIDER_SITE_OTHER): Payer: Medicare Other | Admitting: Adult Health

## 2015-06-08 ENCOUNTER — Encounter: Payer: Self-pay | Admitting: Adult Health

## 2015-06-08 ENCOUNTER — Ambulatory Visit (INDEPENDENT_AMBULATORY_CARE_PROVIDER_SITE_OTHER): Payer: Medicare Other | Admitting: Internal Medicine

## 2015-06-08 VITALS — BP 134/60 | HR 78 | Temp 97.7°F | Resp 16 | Wt 143.0 lb

## 2015-06-08 DIAGNOSIS — H4011X2 Primary open-angle glaucoma, moderate stage: Secondary | ICD-10-CM | POA: Diagnosis not present

## 2015-06-08 DIAGNOSIS — Z006 Encounter for examination for normal comparison and control in clinical research program: Secondary | ICD-10-CM

## 2015-06-08 DIAGNOSIS — R0600 Dyspnea, unspecified: Secondary | ICD-10-CM

## 2015-06-08 DIAGNOSIS — J449 Chronic obstructive pulmonary disease, unspecified: Secondary | ICD-10-CM

## 2015-06-08 LAB — PULMONARY FUNCTION TEST
FEF 25-75 Post: 0.36 L/sec
FEF 25-75 Pre: 0.3 L/sec
FEF2575-%Change-Post: 16 %
FEF2575-%PRED-POST: 22 %
FEF2575-%Pred-Pre: 19 %
FEV1-%Change-Post: 5 %
FEV1-%PRED-POST: 37 %
FEV1-%Pred-Pre: 35 %
FEV1-POST: 0.82 L
FEV1-PRE: 0.78 L
FEV1FVC-%CHANGE-POST: 1 %
FEV1FVC-%PRED-PRE: 57 %
FEV6-%Change-Post: 4 %
FEV6-%PRED-PRE: 61 %
FEV6-%Pred-Post: 63 %
FEV6-Post: 1.79 L
FEV6-Pre: 1.71 L
FEV6FVC-%Change-Post: 0 %
FEV6FVC-%Pred-Post: 100 %
FEV6FVC-%Pred-Pre: 99 %
FVC-%Change-Post: 3 %
FVC-%Pred-Post: 63 %
FVC-%Pred-Pre: 61 %
FVC-PRE: 1.82 L
FVC-Post: 1.88 L
PRE FEV1/FVC RATIO: 43 %
PRE FEV6/FVC RATIO: 94 %
Post FEV1/FVC ratio: 44 %
Post FEV6/FVC ratio: 95 %

## 2015-06-08 NOTE — Assessment & Plan Note (Signed)
Doing well in research study  Cont w/ protocol

## 2015-06-08 NOTE — Progress Notes (Signed)
[id number]: [facility] IMPACT research study: blinded >/= 52 week randomized trial of ANORO equivalent v BREO equivalent v GSK triple MDI (ICS + LABA + LAMA). Sponsored by [place]   This visit 06/08/2015 for [name] with 1932/04/15 who is subject number [id number] is a research Visit and is for purpose of follow up and is number 6  on PROTOCOL.  Subject is doing well with no complaints.  Will return in Sept for her final visit.

## 2015-06-08 NOTE — Progress Notes (Signed)
   Subjective:    Patient ID: [name], female    DOB: 09-09-1932, 81 y.o.   MRN: [id number]  HPI [id number]: [facility] IMPACT research study: blinded >/= 52 week randomized trial of ANORO equivalent v BREO equivalent v GSK triple MDI (ICS + LABA + LAMA). Sponsored by [place]  This visit 06/08/2015 for [name] with [birth year] who is subject number [id number] is a research Visit and is for purpose of follow up and is number 6 on PROTOCOL. Subject is doing well with no complaints. Will return in Sept for her final visit.   06/08/2015 IMPACT Study Visit 6  This visit 06/08/2015 for [name] with October 07, 1932 who is subject number [id number] is a research Visit and is for purpose of follow up and is number 6 on PROTOCOL. Subject is doing well with no complaints. Will return in Sept for her final visit. Says breathing is at baseline without flare of cough or wheezing  No complaints.   Review of Systems See HPI    Objective:   Physical Exam ENT : no oral candidida  Lung Decreased BS in bases         Assessment & Plan:

## 2015-06-08 NOTE — Assessment & Plan Note (Addendum)
Compensated on regimen  follow up with Dr. Melvyn Novas  In 6 weeks and As needed

## 2015-06-08 NOTE — Progress Notes (Signed)
Spirometry pre and post done today. 

## 2015-06-08 NOTE — Patient Instructions (Signed)
Continue on Study protocol

## 2015-06-13 ENCOUNTER — Encounter: Payer: Medicare Other | Admitting: Adult Health

## 2015-07-18 ENCOUNTER — Ambulatory Visit (INDEPENDENT_AMBULATORY_CARE_PROVIDER_SITE_OTHER): Payer: Medicare Other | Admitting: Podiatry

## 2015-07-18 DIAGNOSIS — B351 Tinea unguium: Secondary | ICD-10-CM | POA: Diagnosis not present

## 2015-07-18 DIAGNOSIS — M79606 Pain in leg, unspecified: Secondary | ICD-10-CM | POA: Diagnosis not present

## 2015-07-18 NOTE — Progress Notes (Signed)
Subjective: 81 y.o. returns the office today for painful, elongated, thickened toenails which she is unable to trim herself. Denies any redness or drainage around the nails. Denies any acute changes since last appointment and no new complaints today. Denies any systemic complaints such as fevers, chills, nausea, vomiting.   Objective: AAO 3, NAD DP/PT pulses palpable 1/4, CRT less than 3 seconds Protective sensation decreased with Simms Weinstein monofilament, Achilles tendon reflex intact.  Nails hypertrophic, dystrophic, elongated, brittle, discolored 10. There is tenderness overlying the nails 1-5 bilaterally. There is no surrounding erythema or drainage along the nail sites. No open lesions or pre-ulcerative lesions are identified. No other areas of tenderness bilateral lower extremities. No overlying edema, erythema, increased warmth. No pain with calf compression, swelling, warmth, erythema.  Assessment: Patient presents with symptomatic onychomycosis  Plan: -Treatment options including alternatives, risks, complications were discussed -Nails sharply debrided 10 without complication/bleeding. -Discussed daily foot inspection. If there are any changes, to call the office immediately.  -Follow-up in 3 months or sooner if any problems are to arise. In the meantime, encouraged to call the office with any questions, concerns, changes symptoms.   Celesta Gentile, DPM

## 2015-08-31 ENCOUNTER — Ambulatory Visit (INDEPENDENT_AMBULATORY_CARE_PROVIDER_SITE_OTHER): Payer: Medicare Other | Admitting: Internal Medicine

## 2015-08-31 ENCOUNTER — Other Ambulatory Visit: Payer: Self-pay | Admitting: Internal Medicine

## 2015-08-31 ENCOUNTER — Encounter: Payer: Self-pay | Admitting: Adult Health

## 2015-08-31 ENCOUNTER — Telehealth: Payer: Self-pay | Admitting: Adult Health

## 2015-08-31 ENCOUNTER — Ambulatory Visit (INDEPENDENT_AMBULATORY_CARE_PROVIDER_SITE_OTHER): Payer: Medicare Other | Admitting: Adult Health

## 2015-08-31 VITALS — BP 118/76 | HR 91 | Temp 97.9°F | Resp 17 | Wt 144.4 lb

## 2015-08-31 DIAGNOSIS — J449 Chronic obstructive pulmonary disease, unspecified: Secondary | ICD-10-CM

## 2015-08-31 DIAGNOSIS — Z006 Encounter for examination for normal comparison and control in clinical research program: Secondary | ICD-10-CM

## 2015-08-31 LAB — PULMONARY FUNCTION TEST
FEF 25-75 POST: 0.31 L/s
FEF 25-75 PRE: 0.33 L/s
FEF2575-%Change-Post: -4 %
FEF2575-%Pred-Post: 20 %
FEF2575-%Pred-Pre: 21 %
FEV1-%CHANGE-POST: -2 %
FEV1-%PRED-POST: 35 %
FEV1-%PRED-PRE: 36 %
FEV1-POST: 0.79 L
FEV1-Pre: 0.81 L
FEV1FVC-%Change-Post: 0 %
FEV1FVC-%PRED-PRE: 57 %
FEV6-%CHANGE-POST: -2 %
FEV6-%PRED-POST: 63 %
FEV6-%Pred-Pre: 64 %
FEV6-Post: 1.77 L
FEV6-Pre: 1.8 L
FEV6FVC-%CHANGE-POST: 1 %
FEV6FVC-%PRED-POST: 100 %
FEV6FVC-%Pred-Pre: 99 %
FVC-%Change-Post: -3 %
FVC-%Pred-Post: 63 %
FVC-%Pred-Pre: 65 %
FVC-Post: 1.86 L
FVC-Pre: 1.92 L
POST FEV1/FVC RATIO: 42 %
PRE FEV1/FVC RATIO: 42 %
Post FEV6/FVC ratio: 95 %
Pre FEV6/FVC Ratio: 94 %

## 2015-08-31 MED ORDER — TIOTROPIUM BROMIDE MONOHYDRATE 18 MCG IN CAPS: 18.0000 ug | ORAL_CAPSULE | Freq: Every day | RESPIRATORY_TRACT | Status: DC

## 2015-08-31 NOTE — Progress Notes (Signed)
[id number]: GSK IMPACT research study: blinded >/= 52 week randomized trial of ANORO equivalent v BREO equivalent v GSK triple MDI (ICS + LABA + LAMA). Sponsored by [place]   This visit 08/31/2015 for [name] with [birth year] who is subject number [id number] is a research Visit and is for purpose of end of treatment and is number 7 on PROTOCOL.  Subject is doing well but is concerned about coming of treatment is interested in any more studies.  Will call in 2 weeks to see how she is doing.

## 2015-08-31 NOTE — Telephone Encounter (Signed)
Called and spoke to pt. Pt states she was informing Estill Bamberg about the DME company she uses. Spoke with Estill Bamberg, needed to know what DME company to send the neb meds to. Pt now aware Estill Bamberg will send in the neb meds to [facility], no need to call pt back. Per Amanda's request will forward message to her.

## 2015-08-31 NOTE — Patient Instructions (Signed)
Restart Spiriva daily .  Restart Pulmicort and Perforomist Neb Twice daily   Follow up in 2 weeks for med calendar and As needed

## 2015-08-31 NOTE — Telephone Encounter (Signed)
Pulmicort .25mg /28ml and perforomist 45mcg/2ml neb solution ordered through [facility]. Nothing further needed

## 2015-08-31 NOTE — Assessment & Plan Note (Signed)
Compensated  Resume prior study tx regimen   Plan  Restart Spiriva daily .  Restart Pulmicort and Perforomist Neb Twice daily   Follow up in 2 weeks for med calendar and As needed

## 2015-08-31 NOTE — Progress Notes (Signed)
Spirometry pre and post done today. 

## 2015-08-31 NOTE — Assessment & Plan Note (Addendum)
Doing well on study drug without flare  Today is end of study .  follow up phone call in 2 weeks per protocol   Plan  Finish Study drug per protocol  Resume previous COPD regimen

## 2015-08-31 NOTE — Progress Notes (Signed)
   Subjective:    Patient ID: [name], female    DOB: May 21, 1932, 81 y.o.   MRN: [id number]  HPI  [id number]: GSK IMPACT research study: blinded >/= 52 week randomized trial of ANORO equivalent v BREO equivalent v GSK triple MDI (ICS + LABA + LAMA). Sponsored by [place]  This visit 06/08/2015 for [name] with 03/11/1932 who is subject number [id number] is a research Visit and is for purpose of follow up and is number 6 on PROTOCOL. Subject is doing well with no complaints.    08/31/2015 IMPACT Study Visit 7  Patient is doing well with no complaints.  Says breathing is at baseline without flare of cough or wheezing  No complaints. Denies chest pain, orthopnea or edeam  This is the end of study with last dose of study drug today .  She will resume previous regimen with Pulmicort and Performist and Spiriva .    Review of Systems  See HPI    Objective:   Physical Exam  GEN: A/Ox3; pleasant , NAD, chronically ill appearing   HEENT:  [place]/AT,   NOSE-clear, THROAT-clear, no lesions, no postnasal drip or exudate noted.   NECK:  Supple w/ fair ROM; no JVD; normal carotid impulses w/o bruits; no thyromegaly or nodules palpated; no lymphadenopathy.  RESP  Decreased BS in bases , no accessory muscle use, no dullness to percussion  CARD:  RRR, no m/r/g  , no peripheral edema, pulses intact, no cyanosis or clubbing.  GI:   Soft & nt; nml bowel sounds; no organomegaly or masses detected.  Musco: Warm bil, no deformities or joint swelling noted.   Neuro: alert, no focal deficits noted.    Skin: Warm, no lesions or rashes        Assessment & Plan:

## 2015-09-02 ENCOUNTER — Other Ambulatory Visit: Payer: Self-pay | Admitting: Emergency Medicine

## 2015-09-02 ENCOUNTER — Other Ambulatory Visit: Payer: Self-pay | Admitting: Adult Health

## 2015-09-02 MED ORDER — BUDESONIDE 0.25 MG/2ML IN SUSP: 0.2500 mg | Freq: Two times a day (BID) | RESPIRATORY_TRACT | Status: DC

## 2015-09-02 MED ORDER — FORMOTEROL FUMARATE 20 MCG/2ML IN NEBU: 20.0000 ug | INHALATION_SOLUTION | Freq: Two times a day (BID) | RESPIRATORY_TRACT | Status: DC

## 2015-09-02 NOTE — Telephone Encounter (Signed)
She shouldn't worry if she misses a few doses of budesonide as the effect lasts a week after last dose

## 2015-09-02 NOTE — Telephone Encounter (Signed)
Patient does not have enough medication to take for the weekend. Patient has Performist but does not have the Budesonide   She needs to know what to do. Please advise.

## 2015-09-02 NOTE — Telephone Encounter (Signed)
Spoke with pt and advised of Dr wert's recommendations.  Pt verbalized understanding.  Nothing further is needed. 

## 2015-09-20 ENCOUNTER — Encounter: Payer: Self-pay | Admitting: Adult Health

## 2015-09-20 ENCOUNTER — Ambulatory Visit (INDEPENDENT_AMBULATORY_CARE_PROVIDER_SITE_OTHER): Payer: Medicare Other | Admitting: Adult Health

## 2015-09-20 VITALS — BP 132/78 | HR 75 | Temp 97.6°F | Ht 67.0 in | Wt 146.0 lb

## 2015-09-20 DIAGNOSIS — J449 Chronic obstructive pulmonary disease, unspecified: Secondary | ICD-10-CM

## 2015-09-20 DIAGNOSIS — Z23 Encounter for immunization: Secondary | ICD-10-CM | POA: Diagnosis not present

## 2015-09-20 DIAGNOSIS — J9612 Chronic respiratory failure with hypercapnia: Secondary | ICD-10-CM | POA: Diagnosis not present

## 2015-09-20 NOTE — Patient Instructions (Signed)
Flu shot today  Follow med calendar closely and bring to each visit. Follow Dr. Melvyn Novas in 3 months and as needed

## 2015-09-20 NOTE — Progress Notes (Signed)
d  Subjective:    Patient ID: [name]    DOB: 03-Jan-1932   MRN: [id number]  Brief patient profile:  28 yowf quit smoking 02/2013  referred 02/08/2012 by Glade Lloyd for intermittent sob was on 02 but stopped in early 2013 and with GOLD III COPD documented 03/2012   History of Present Illness  11/04/2014 f/u ov/Wert re: COPD GOLDIII, not longer keepng up with med calendar  Chief Complaint  Patient presents with  . Follow-up    Pt states that her breathing is doing well. She is only using study drug- has stopped nebs and spiriva.     Not limited by breathing from desired activities  On 02, very slow pace, very sedentary No need for saba  rec Follow med calendar    04/14/2015 f/u ov/Wert re: GOLD III copd/ 02 dep/  just finished pred on day of ov for "aecopd"  = nose running, some cough, fatigue  Chief Complaint  Patient presents with  . Follow-up    Pt states that her breathing has been doing well. No new co's today.    2lpm 24/7,  Not limited by breathing from desired activities  Though quite sedentary  >no changes   09/20/2015 Follow up COPD /O2 dependent RF  Pt returns for follow up for COPD.  She has just finished the  IMPACT study .  Feels she did well during study  Has resumed her regimen of budesonide and perforomist neb along with spiriva .  Needs flu shot.  Breathing is at baseline with O2 at 2l/m  Denies chest pain, orthopnea , edema or n/v/d.  We reviewed all her meds and organized them into a med calendar with pt education  Appears to be taking correctly.   Current Medications, Allergies, Complete Past Medical History, Past Surgical History, Family History, and Social History were reviewed in [employer] record.  ROS  The following are not active complaints unless bolded sore throat, dysphagia, dental problems, itching, sneezing,  nasal congestion or excess/ purulent secretions, ear ache,   fever, chills, sweats, unintended wt loss,  pleuritic or exertional cp, hemoptysis,  orthopnea pnd or leg swelling, presyncope, palpitations, heartburn, abdominal pain, anorexia, nausea, vomiting, diarrhea  or change in bowel or urinary habits, change in stools or urine, dysuria,hematuria,  rash, arthralgias, visual complaints, headache, numbness weakness or ataxia or problems with walking or coordination,  change in mood/affect or memory.          Objective:   Physical Exam   Thin amb wf nad on 02  2lpm    Wt 97 02/08/2012 > 03/24/2012  99 > 103 05/09/2012 > 06/12/2012  99 >   09/24/2012  96 > 98 01/05/2013 >  106 05/01/2013 > 05/05/2013  99 > 05/15/2013  104 > 06/01/2013 115 > 115 07/07/2013 > 114  07/17/13 > 127 09/18/2013 > 12/01/2013  131 >135 03/04/2014 > 03/12/2014  133 > 05/27/2014  133  > 06/28/2014  133 >131 07/26/2014 >133 09/21/2014 139 >  04/14/2015  138  > 09/20/2015     HEENT mild turbinate edema.  Oropharynx no thrush or excess pnd or cobblestoning.  No JVD or cervical adenopathy. Mild accessory muscle hypertrophy. Trachea midline, nl thryroid. Chest was hyperinflated by percussion with diminished breath sounds, faint exp wheeze. Regular rate and rhythm without murmur gallop or rub or increase P2 edema resolved.  Abd: no hsm, nl excursion. Ext warm without cyanosis or clubbing.      CXR  06/28/2014 : Findings consistent with chronic obstructive pulmonary disease. No acute cardiopulmonary abnormality seen.         Assessment & Plan:

## 2015-09-20 NOTE — Assessment & Plan Note (Signed)
Doing well on O2

## 2015-09-20 NOTE — Progress Notes (Signed)
Chart and office note reviewed in detail  > agree with a/p as outlined    

## 2015-09-20 NOTE — Assessment & Plan Note (Signed)
Doing well on present regimen  Patient's medications were reviewed today and patient education was given. Computerized medication calendar was adjusted/completed   Plan  Flu shot today  Follow med calendar closely and bring to each visit. Follow Dr. Melvyn Novas in 3 months and as needed

## 2015-09-26 NOTE — Addendum Note (Signed)
Addended by: Osa Craver on: 09/26/2015 05:44 PM   Modules accepted: Orders, Medications

## 2015-10-25 ENCOUNTER — Ambulatory Visit (INDEPENDENT_AMBULATORY_CARE_PROVIDER_SITE_OTHER): Payer: Medicare Other | Admitting: Podiatry

## 2015-10-25 DIAGNOSIS — B351 Tinea unguium: Secondary | ICD-10-CM

## 2015-10-25 DIAGNOSIS — M79676 Pain in unspecified toe(s): Secondary | ICD-10-CM

## 2015-10-25 NOTE — Progress Notes (Signed)
Patient ID: [name], female   DOB: Jul 09, 1932, 81 y.o.   MRN: [id number] Complaint:  Visit Type: Patient returns to my office for continued preventative foot care services. Complaint: Patient states" my nails have grown long and thick and become painful to walk and wear shoes" . The patient presents for preventative foot care services. No changes to ROS  Podiatric Exam: Vascular: dorsalis pedis and posterior tibial pulses are palpable bilateral. Capillary return is immediate. Temperature gradient is WNL. Skin turgor WNL  Sensorium: Diminished  Semmes Weinstein monofilament test. Normal tactile sensation bilaterally. Nail Exam: Pt has thick disfigured discolored nails with subungual debris noted bilateral entire nail hallux through fifth toenails Ulcer Exam: There is no evidence of ulcer or pre-ulcerative changes or infection. Orthopedic Exam: Muscle tone and strength are WNL. No limitations in general ROM. No crepitus or effusions noted. Foot type and digits show no abnormalities. Bony prominences are unremarkable. Skin: No Porokeratosis. No infection or ulcers  Diagnosis:  Onychomycosis, , Pain in right toe, pain in left toes  Treatment & Plan Procedures and Treatment: Consent by patient was obtained for treatment procedures. The patient understood the discussion of treatment and procedures well. All questions were answered thoroughly reviewed. Debridement of mycotic and hypertrophic toenails, 1 through 5 bilateral and clearing of subungual debris. No ulceration, no infection noted.  Return Visit-Office Procedure: Patient instructed to return to the office for a follow up visit 3 months for continued evaluation and treatment.

## 2015-11-01 ENCOUNTER — Telehealth: Payer: Self-pay | Admitting: Internal Medicine

## 2015-11-01 MED ORDER — AZITHROMYCIN 250 MG PO TABS: ORAL_TABLET | ORAL | Status: DC

## 2015-11-01 MED ORDER — PREDNISONE 10 MG PO TABS: ORAL_TABLET | ORAL | Status: DC

## 2015-11-01 NOTE — Telephone Encounter (Signed)
Spoke with pt, aware of results/recs.  meds sent to preferred pharmacy.  Nothing further needed.

## 2015-11-01 NOTE — Telephone Encounter (Signed)
Spoke with pt, c/o prod cough with yellow/green mucus since Friday.  Denies sinus congestion, fever.   Pt has been taking mucinex since Friday which is not helping.   Pt uses [facility] on [address].    MW please advise on recs.  Thanks!

## 2015-11-01 NOTE — Telephone Encounter (Signed)
zpak Prednisone 10 mg take  4 each am x 2 days,   2 each am x 2 days,  1 each am x 2 days and stop  

## 2015-11-10 ENCOUNTER — Telehealth: Payer: Self-pay | Admitting: Medical

## 2015-11-10 NOTE — Telephone Encounter (Signed)
Called pt to try and set up a CPE since she has not been in a while, pt said she sees Dr Melvyn Novas and he fills all her medicine, and she has been apart of a research for a inhaler program. Tried to get her to come in to get a CPE but said she dont drive she gives out of breathe to easy,so someone else has to drive her, said she would let us know if she needed [place]  To come in for a CPE, but she she has been doing really good so she hasnt needed to come in here for anything, told pt to please call us if she needed anything told me thankl you  for calling to check on her,

## 2015-12-05 ENCOUNTER — Other Ambulatory Visit: Payer: Self-pay | Admitting: Internal Medicine

## 2015-12-09 ENCOUNTER — Telehealth: Payer: Self-pay | Admitting: Internal Medicine

## 2015-12-09 MED ORDER — AZITHROMYCIN 250 MG PO TABS: ORAL_TABLET | ORAL | Status: DC

## 2015-12-09 MED ORDER — PREDNISONE 10 MG PO TABS: ORAL_TABLET | ORAL | Status: DC

## 2015-12-09 NOTE — Telephone Encounter (Signed)
Called spoke with pt. She requesting prednisone and ZPAK. C/o prod cough (clear-yellow/green phlem) x Friday. no wheezing, no chest tx, no PND, no nasal cong. She is taking mucinex q 12 hrs x Saturday. Please advise MW thanks

## 2015-12-09 NOTE — Telephone Encounter (Signed)
ok 

## 2015-12-09 NOTE — Telephone Encounter (Signed)
Called spoke with pt and is aware refill sent in. Nothing further needed

## 2015-12-12 DIAGNOSIS — H401112 Primary open-angle glaucoma, right eye, moderate stage: Secondary | ICD-10-CM | POA: Diagnosis not present

## 2015-12-20 ENCOUNTER — Ambulatory Visit: Payer: Medicare Other | Admitting: Internal Medicine

## 2016-01-12 ENCOUNTER — Ambulatory Visit (INDEPENDENT_AMBULATORY_CARE_PROVIDER_SITE_OTHER): Payer: Medicare Other | Admitting: Medical

## 2016-01-12 ENCOUNTER — Ambulatory Visit
Admission: RE | Admit: 2016-01-12 | Discharge: 2016-01-12 | Disposition: A | Discharge status: Home / Self Care | Payer: Medicare Other | Source: Ambulatory Visit | Attending: Medical | Admitting: Medical

## 2016-01-12 ENCOUNTER — Encounter: Payer: Self-pay | Admitting: Medical

## 2016-01-12 VITALS — BP 120/80 | HR 102 | Temp 99.0°F | Resp 27 | Wt 145.0 lb

## 2016-01-12 DIAGNOSIS — R05 Cough: Secondary | ICD-10-CM | POA: Diagnosis not present

## 2016-01-12 DIAGNOSIS — J449 Chronic obstructive pulmonary disease, unspecified: Secondary | ICD-10-CM

## 2016-01-12 DIAGNOSIS — R0902 Hypoxemia: Secondary | ICD-10-CM | POA: Diagnosis not present

## 2016-01-12 DIAGNOSIS — R059 Cough, unspecified: Secondary | ICD-10-CM

## 2016-01-12 LAB — CBC WITH DIFFERENTIAL/PLATELET
Basophils Absolute: 0 10*3/uL (ref 0.0–0.1)
Basophils Relative: 0 % (ref 0–1)
EOS ABS: 0.2 10*3/uL (ref 0.0–0.7)
Eosinophils Relative: 2 % (ref 0–5)
HCT: 38.3 % (ref 36.0–46.0)
HEMOGLOBIN: 12.6 g/dL (ref 12.0–15.0)
LYMPHS ABS: 1.2 10*3/uL (ref 0.7–4.0)
Lymphocytes Relative: 13 % (ref 12–46)
MCH: 29.4 pg (ref 26.0–34.0)
MCHC: 32.9 g/dL (ref 30.0–36.0)
MCV: 89.5 fL (ref 78.0–100.0)
MONO ABS: 1 10*3/uL (ref 0.1–1.0)
MONOS PCT: 11 % (ref 3–12)
MPV: 10.8 fL (ref 8.6–12.4)
NEUTROS PCT: 74 % (ref 43–77)
Neutro Abs: 7 10*3/uL (ref 1.7–7.7)
Platelets: 261 10*3/uL (ref 150–400)
RBC: 4.28 MIL/uL (ref 3.87–5.11)
RDW: 15.2 % (ref 11.5–15.5)
WBC: 9.5 10*3/uL (ref 4.0–10.5)

## 2016-01-12 MED ORDER — DOXYCYCLINE HYCLATE 100 MG PO TABS: 100.0000 mg | ORAL_TABLET | Freq: Two times a day (BID) | ORAL | Status: DC

## 2016-01-12 MED ORDER — PREDNISONE 10 MG PO TABS: ORAL_TABLET | ORAL | Status: DC

## 2016-01-12 NOTE — Progress Notes (Signed)
Subjective: Chief Complaint  Patient presents with  . Cough    hurts in mid back when she coughs. took mucinex.    Has cough, worse than usual, bad cough spells that wears her out x 4 days.   Used mucinex Monday night.   Has pain in left posterior chest.    Denies fever.  Has no appetite.   Has no NVD, no rash, no sore throat or ear pain, no nasal congestion or sneezing.  Denies body aches or chills.   Husband was sick last week with respiratory infection no urinary symptoms.  No other aggravating or relieving factors. No other complaint.  Past Medical History  Diagnosis Date  . Osteoporosis   . Cor pulmonale ([place])   . Asthma   . H/O bone density study 2008    osteoporosis, improved density on Fosamax at that time  . COPD (chronic obstructive pulmonary disease) ([place]) 01/2010    Dr. Melvyn Novas;  . Hypoxia 01/2013    hospitalization, Bipap therapy, COPD exacerbation  . Former smoker     quit 02/2013   ROS as in subjective   Objective: BP 120/80 mmHg  Pulse 102  Temp(Src) 99 F (37.2 C) (Tympanic)  Resp 27  Wt 145 lb (65.772 kg)  SpO2 98%  General appearance: alert, no distress, WD/WN, seated with nasal cannula oxygen  HEENT: normocephalic, sclerae anicteric, TMs pearly, nares patent, no discharge or erythema, pharynx normal Oral cavity: MMM, no lesions Neck: supple, no lymphadenopathy, no thyromegaly, no masses Heart: RRR, normal S1, S2, no murmurs Lungs: decreased breath sounds, +crackles, +rhonchi, no wheezes Ext: no edema Pulses: 2+ symmetric, upper and lower extremities, normal cap refill    Assessment: Encounter Diagnoses  Name Primary?  . Cough Yes  . Chronic obstructive pulmonary disease, unspecified COPD type ([place])   . Hypoxia     Plan: reviewed recent chart notes.   Will send for STAT CBC and CXR.   C/t oxygen, c/t albuterol prn.    Karalyne was seen today for cough.  Diagnoses and all orders for this visit:  Cough -     DG Chest 2 View; Future -     CBC  with Differential/Platelet  Chronic obstructive pulmonary disease, unspecified COPD type ([place]) -     DG Chest 2 View; Future -     CBC with Differential/Platelet  Hypoxia

## 2016-01-12 NOTE — Addendum Note (Signed)
Addended by: Billie Lade on: 01/12/2016 02:49 PM   Modules accepted: Orders

## 2016-01-13 ENCOUNTER — Telehealth: Payer: Self-pay | Admitting: Medical

## 2016-01-13 ENCOUNTER — Telehealth: Payer: Self-pay

## 2016-01-13 NOTE — Telephone Encounter (Signed)
°  Called pt per shane and let her know her results and to take the medicine that he prescribed her and that he she got worse couldn't breathe over the weekend to go the emergency room. Told pt she had early stages of pneumonia and that is why she was taking the medicine.pt was going to get the medicine from the pharmacy after we spoke.  Pt understood and thanked me for calling and letting her know

## 2016-01-13 NOTE — Telephone Encounter (Signed)
Pt called because she only took 3 of her prednisone yesterday. And was just confused on how to taper. [name] stated that she can take the 3 from yesterday and the 5 for today all at once and then do the four tomorrow like it stated in the directions. Pt stated that she understood

## 2016-01-31 ENCOUNTER — Ambulatory Visit: Payer: Medicare Other | Admitting: Podiatry

## 2016-02-03 ENCOUNTER — Encounter: Payer: Self-pay | Admitting: Podiatry

## 2016-02-03 ENCOUNTER — Ambulatory Visit (INDEPENDENT_AMBULATORY_CARE_PROVIDER_SITE_OTHER): Payer: Medicare Other | Admitting: Podiatry

## 2016-02-03 DIAGNOSIS — M79676 Pain in unspecified toe(s): Secondary | ICD-10-CM | POA: Diagnosis not present

## 2016-02-03 DIAGNOSIS — B351 Tinea unguium: Secondary | ICD-10-CM

## 2016-02-03 NOTE — Progress Notes (Signed)
Patient ID: [name], female   DOB: [birth year], 82 y.o.   MRN: [id number] Complaint:  Visit Type: Patient returns to my office for continued preventative foot care services. Complaint: Patient states" my nails have grown long and thick and become painful to walk and wear shoes" . The patient presents for preventative foot care services. No changes to ROS  Podiatric Exam: Vascular: dorsalis pedis and posterior tibial pulses are palpable bilateral. Capillary return is immediate. Temperature gradient is WNL. Skin turgor WNL  Sensorium: Diminished  Semmes Weinstein monofilament test. Normal tactile sensation bilaterally. Nail Exam: Pt has thick disfigured discolored nails with subungual debris noted bilateral entire nail hallux through fifth toenails Ulcer Exam: There is no evidence of ulcer or pre-ulcerative changes or infection. Orthopedic Exam: Muscle tone and strength are WNL. No limitations in general ROM. No crepitus or effusions noted. Foot type and digits show no abnormalities. Bony prominences are unremarkable. Skin: No Porokeratosis. No infection or ulcers  Diagnosis:  Onychomycosis, , Pain in right toe, pain in left toes  Treatment & Plan Procedures and Treatment: Consent by patient was obtained for treatment procedures. The patient understood the discussion of treatment and procedures well. All questions were answered thoroughly reviewed. Debridement of mycotic and hypertrophic toenails, 1 through 5 bilateral and clearing of subungual debris. No ulceration, no infection noted.  Return Visit-Office Procedure: Patient instructed to return to the office for a follow up visit 3 months for continued evaluation and treatment.   Gardiner Barefoot DPM

## 2016-02-17 ENCOUNTER — Telehealth: Payer: Self-pay | Admitting: Internal Medicine

## 2016-02-17 MED ORDER — AZITHROMYCIN 250 MG PO TABS: ORAL_TABLET | ORAL | Status: DC

## 2016-02-17 MED ORDER — PREDNISONE 10 MG PO TABS: ORAL_TABLET | ORAL | Status: DC

## 2016-02-17 NOTE — Telephone Encounter (Signed)
Per 08/31/15 OV with TP: Patient Instructions       Restart Spiriva daily .   Restart Pulmicort and Perforomist Neb Twice daily    Follow up in 2 weeks for med calendar and As needed   --  Spoke with pt. She c/o prod cough (clear-green phlem), feels weak. Reports she has a constant cough. Denies any wheezing, no chest tightness, no nasal cong, no PND.   She is taking mucinex q12 hrs, using nebs. Please advise MW thanks

## 2016-02-17 NOTE — Telephone Encounter (Signed)
zpak Prednisone 10 mg take  4 each am x 2 days,   2 each am x 2 days,  1 each am x 2 days and stop  

## 2016-02-17 NOTE — Telephone Encounter (Signed)
Called spoke with pt. Aware of recs below. RX's sent in. Nothing further needed 

## 2016-02-21 ENCOUNTER — Other Ambulatory Visit: Payer: Self-pay | Admitting: Adult Health

## 2016-03-02 ENCOUNTER — Encounter: Payer: Self-pay | Admitting: *Deleted

## 2016-03-14 DIAGNOSIS — D1801 Hemangioma of skin and subcutaneous tissue: Secondary | ICD-10-CM | POA: Diagnosis not present

## 2016-03-14 DIAGNOSIS — L821 Other seborrheic keratosis: Secondary | ICD-10-CM | POA: Diagnosis not present

## 2016-04-18 ENCOUNTER — Telehealth: Payer: Self-pay | Admitting: Internal Medicine

## 2016-04-18 MED ORDER — ALBUTEROL SULFATE HFA 108 (90 BASE) MCG/ACT IN AERS: 2.0000 | INHALATION_SPRAY | RESPIRATORY_TRACT | Status: DC | PRN

## 2016-04-18 NOTE — Telephone Encounter (Signed)
Called spoke with pt. She states that she needs a refill on her albuterol inhaler. I verified that pharmacy as [facility] on [address]. She voiced understanding and had no further questions. i scheduled her for an ov on 05/07/16 with MW. Nothing further needed.

## 2016-04-22 ENCOUNTER — Other Ambulatory Visit: Payer: Self-pay | Admitting: Internal Medicine

## 2016-05-08 ENCOUNTER — Ambulatory Visit (INDEPENDENT_AMBULATORY_CARE_PROVIDER_SITE_OTHER): Payer: Medicare Other | Admitting: Internal Medicine

## 2016-05-08 ENCOUNTER — Encounter: Payer: Self-pay | Admitting: Internal Medicine

## 2016-05-08 ENCOUNTER — Ambulatory Visit (INDEPENDENT_AMBULATORY_CARE_PROVIDER_SITE_OTHER)
Admission: RE | Admit: 2016-05-08 | Discharge: 2016-05-08 | Disposition: A | Discharge status: Home / Self Care | Payer: Medicare Other | Source: Ambulatory Visit | Attending: Internal Medicine | Admitting: Internal Medicine

## 2016-05-08 ENCOUNTER — Ambulatory Visit: Payer: Medicare Other | Admitting: Podiatry

## 2016-05-08 VITALS — BP 124/74 | HR 82 | Ht 67.0 in | Wt 146.0 lb

## 2016-05-08 DIAGNOSIS — J9612 Chronic respiratory failure with hypercapnia: Secondary | ICD-10-CM

## 2016-05-08 DIAGNOSIS — J432 Centrilobular emphysema: Secondary | ICD-10-CM | POA: Diagnosis not present

## 2016-05-08 DIAGNOSIS — J441 Chronic obstructive pulmonary disease with (acute) exacerbation: Secondary | ICD-10-CM

## 2016-05-08 DIAGNOSIS — J449 Chronic obstructive pulmonary disease, unspecified: Secondary | ICD-10-CM | POA: Diagnosis not present

## 2016-05-08 MED ORDER — AZITHROMYCIN 250 MG PO TABS: ORAL_TABLET | ORAL | Status: DC

## 2016-05-08 MED ORDER — UMECLIDINIUM BROMIDE 62.5 MCG/INH IN AEPB: 1.0000 | INHALATION_SPRAY | Freq: Every morning | RESPIRATORY_TRACT | Status: DC

## 2016-05-08 NOTE — Patient Instructions (Addendum)
zpak   Stop spiriva and start Incruse one click each am  Remember the mucinex dm is 1200 mg every 12 hours and use the flutter  Please see patient coordinator before you leave today  to schedule eval for POC  See calendar for specific medication instructions and bring it back for each and every office visit for every healthcare provider you see.  Without it,  you may not receive the best quality medical care that we feel you deserve.  You will note that the calendar groups together  your maintenance  medications that are timed at particular times of the day.  Think of this as your checklist for what your doctor has instructed you to do until your next evaluation to see what benefit  there is  to staying on a consistent group of medications intended to keep you well.  The other group at the bottom is entirely up to you to use as you see fit  for specific symptoms that may arise between visits that require you to treat them on an as needed basis.  Think of this as your action plan or "what if" list.   Separating the top medications from the bottom group is fundamental to providing you adequate care going forward.    Please schedule a follow up visit in  6 months but call sooner if needed

## 2016-05-08 NOTE — Progress Notes (Signed)
d  Subjective:    Patient ID: [name]     DOB: Aug 21, 1932     MRN: [id number]  Brief patient profile:  44 yowf quit smoking 02/2013  referred 02/08/2012 by Glade Lloyd for intermittent sob was on 02 but stopped in early 2013 and with GOLD III COPD documented 03/2012     History of Present Illness  11/04/2014 f/u ov/Zenita Kister re: COPD GOLDIII, not longer keepng up with med calendar  Chief Complaint  Patient presents with  . Follow-up    Pt states that her breathing is doing well. She is only using study drug- has stopped nebs and spiriva.     Not limited by breathing from desired activities  On 02, very slow pace, very sedentary No need for saba  rec Follow med calendar       09/20/2015 Follow up COPD /O2 dependent RF  Pt returns for follow up for COPD.  She has just finished the  IMPACT study .  Feels she did well during study  Has resumed her regimen of budesonide and perforomist neb along with spiriva .  Needs flu shot.  Breathing is at baseline with O2 at 2l/m  Denies chest pain, orthopnea , edema or n/v/d.  We reviewed all her meds and organized them into a med calendar with pt education  Appears to be taking correctly.  rec No change rx/ follow med calender   05/08/2016  f/u ov/Suda Forbess re: COPD III/ 02 dep 2lpm 24/7 on bud/perf/spiriva dpi Chief Complaint  Patient presents with  . Follow-up    Pt c/o increased cough x 4 days- prod with min clear to yellow sputum. She has not needed ventolin and states she does not have albuterol nebs.   mild flare of cough x 4 days/ wants to try the Elipta device as used it during a drug study and liked it better than handihaler   No obvious day to day or daytime variability or assoc increase in baseline sob with min activity/ 02 requirements/mucus plugs or hemoptysis or cp or chest tightness, subjective wheeze or overt sinus or hb symptoms. No unusual exp hx or h/o childhood pna/ asthma or knowledge of premature birth.  Sleeping ok  without nocturnal  or early am exacerbation  of respiratory  c/o's or need for noct saba. Also denies any obvious fluctuation of symptoms with weather or environmental changes or other aggravating or alleviating factors except as outlined above   Current Medications, Allergies, Complete Past Medical History, Past Surgical History, Family History, and Social History were reviewed in [facility] record.  ROS  The following are not active complaints unless bolded sore throat, dysphagia, dental problems, itching, sneezing,  nasal congestion or excess/ purulent secretions, ear ache,   fever, chills, sweats, unintended wt loss, classically pleuritic or exertional cp,  orthopnea pnd or leg swelling, presyncope, palpitations, abdominal pain, anorexia, nausea, vomiting, diarrhea  or change in bowel or bladder habits, change in stools or urine, dysuria,hematuria,  rash, arthralgias, visual complaints, headache, numbness, weakness or ataxia or problems with walking or coordination,  change in mood/affect or memory.                     Objective:   Physical Exam   Thin hoarse w/c  Bound wf nad on 02  2lpm    Wt 97 02/08/2012 > 03/24/2012  99 > 103 05/09/2012 > 06/12/2012  99 >   09/24/2012  96 > 98 01/05/2013 >  106 05/01/2013 > 05/05/2013  99 > 05/15/2013  104 > 06/01/2013 115 > 115 07/07/2013 > 114  07/17/13 > 127 09/18/2013 > 12/01/2013  131 >135 03/04/2014 > 03/12/2014  133 > 05/27/2014  133  > 06/28/2014  133 >131 07/26/2014 >133 09/21/2014 139 >  04/14/2015  138  >  05/08/2016  146     HEENT mild turbinate edema.  Oropharynx no thrush or excess pnd or cobblestoning.  No JVD or cervical adenopathy. Mild accessory muscle hypertrophy. Trachea midline, nl thryroid. Chest was hyperinflated by percussion with diminished breath sounds, faint exp wheeze. Regular rate and rhythm without murmur gallop or rub or increase P2 edema resolved.  Abd: no hsm, nl excursion. Ext warm without cyanosis or clubbing.       CXR PA and Lateral:   05/08/2016 :    I personally reviewed images and agree with radiology impression as follows:     COPD. There is no pneumonia, CHF, nor other acute cardiopulmonary abnormality.        Assessment & Plan:

## 2016-05-08 NOTE — Progress Notes (Signed)
Quick Note:  Spoke with pt and notified of results per Dr. Wert. Pt verbalized understanding and denied any questions.  ______ 

## 2016-05-10 ENCOUNTER — Encounter: Payer: Self-pay | Admitting: Internal Medicine

## 2016-05-10 NOTE — Assessment & Plan Note (Addendum)
-   PFTs 03/24/2012  FEV1  0.91 (43%) ratio 48 and no better p B2,  DLCO 38% corrects to 53%  - changed to perforomist/bud bid 04/2013  -2015 pulmonary rehab  - med calendar 07/17/13 > did not bring 06/28/2014 , .redone 07/26/2014  - 06/28/2014 p extensive coaching HFA effectiveness =    75% (short Ti) - 08/24/14 started IMPACT trial >finished 08/2015  - PFTs 12/13/14  FEV1 0.85 (38%) ratio 45  p 5 % improvement from saba  09/20/2015 Med calendar    - 05/08/2016 changed lama to incruse / demonstrated device   Having mild flare ? Uri > zpak but no need to change rx  I had an extended discussion with the patient reviewing all relevant studies completed to date and  lasting 15 to 20 minutes of a 25 minute visit    Formulary restrictions will be an ongoing challenge for the forseable future and I would be happy to pick an alternative if the pt will first  provide me a list of them but pt  will need to return here for training for any new device that is required eg dpi vs hfa vs respimat.    In meantime we can always provide samples so the patient never runs out of any needed respiratory medications.   Each maintenance medication was reviewed in detail including most importantly the difference between maintenance and prns and under what circumstances the prns are to be triggered using an action plan format that is not reflected in the computer generated alphabetically organized AVS but trather by a customized med calendar that reflects the AVS meds with confirmed 100% correlation.   Please see instructions for details which were reviewed in writing and the patient given a copy highlighting the part that I personally wrote and discussed at today's ov.

## 2016-05-10 NOTE — Assessment & Plan Note (Deleted)
-   PFTs 03/24/2012  FEV1  0.91 (43%) ratio 48 and no better p B2,  DLCO 38% corrects to 53%  - changed to perforomist/bud bid 04/2013  -2015 pulmonary rehab  - med calendar 07/17/13 > did not bring 06/28/2014 , .redone 07/26/2014  - 06/28/2014 p extensive coaching HFA effectiveness =    75% (short Ti) - 08/24/14 started IMPACT trial >finished 08/2015  - PFTs 12/13/14  FEV1 0.85 (38%) ratio 45  p 5 % improvement from saba  09/20/2015 Med calendar    - 05/08/2016 changed lama to incruse / demonstrated device   Having mild flare ? Uri > zpak but no need to change rx   I had an extended discussion with the patient reviewing all relevant studies completed to date and  lasting 15 to 20 minutes of a 25 minute visit    Each maintenance medication was reviewed in detail including most importantly the difference between maintenance and prns and under what circumstances the prns are to be triggered using an action plan format that is not reflected in the computer generated alphabetically organized AVS but trather by a customized med calendar that reflects the AVS meds with confirmed 100% correlation.   Please see instructions for details which were reviewed in writing and the patient given a copy highlighting the part that I personally wrote and discussed at today's ov.

## 2016-05-10 NOTE — Assessment & Plan Note (Signed)
-   ono RA 05/14/12   4 h 33 min < 89%   So ordered 02 2lpm    - HCO3  32 05/10/13    - 01/05/2013   Walked RA x one lap @ 185 stopped due to  88%   - 01/05/2013  Walked 2lpm 2 laps @ 185 ft each stopped due to  90% sob but improved vs baseline   - RA 05/01/2013 sats = 87%    Rx= 02 2lpm 24/7  As of  05/08/2016 >  Needs poc if qualifies   Though somewhat paradoxic, when the lung fails to clear C02 properly and pC02 rises the lung then becomes a more efficient scavenger of C02 allowing lower work of breathing and  better C02 clearance albeit at a higher serum pC02 level - this is why pts can look a lot better than their ABG's would suggest and why it's so difficult to prognosticate endstage dz.  It's also why I strongly rec DNI status (ventilating pts down to a nl pC02 adversely affects this compensatory mechanism)

## 2016-05-15 ENCOUNTER — Ambulatory Visit (INDEPENDENT_AMBULATORY_CARE_PROVIDER_SITE_OTHER): Payer: Medicare Other | Admitting: Podiatry

## 2016-05-15 ENCOUNTER — Encounter: Payer: Self-pay | Admitting: Podiatry

## 2016-05-15 DIAGNOSIS — M79676 Pain in unspecified toe(s): Secondary | ICD-10-CM

## 2016-05-15 DIAGNOSIS — B351 Tinea unguium: Secondary | ICD-10-CM

## 2016-05-15 NOTE — Progress Notes (Signed)
Patient ID: [name], female   DOB: [birth year], 82 y.o.   MRN: [id number] Complaint:  Visit Type: Patient returns to my office for continued preventative foot care services. Complaint: Patient states" my nails have grown long and thick and become painful to walk and wear shoes" . The patient presents for preventative foot care services. No changes to ROS  Podiatric Exam: Vascular: dorsalis pedis and posterior tibial pulses are palpable bilateral. Capillary return is immediate. Temperature gradient is WNL. Skin turgor WNL  Sensorium: Diminished  Semmes Weinstein monofilament test. Normal tactile sensation bilaterally. Nail Exam: Pt has thick disfigured discolored nails with subungual debris noted bilateral entire nail hallux through fifth toenails Ulcer Exam: There is no evidence of ulcer or pre-ulcerative changes or infection. Orthopedic Exam: Muscle tone and strength are WNL. No limitations in general ROM. No crepitus or effusions noted. Foot type and digits show no abnormalities. Bony prominences are unremarkable. Skin: No Porokeratosis. No infection or ulcers  Diagnosis:  Onychomycosis, , Pain in right toe, pain in left toes  Treatment & Plan Procedures and Treatment: Consent by patient was obtained for treatment procedures. The patient understood the discussion of treatment and procedures well. All questions were answered thoroughly reviewed. Debridement of mycotic and hypertrophic toenails, 1 through 5 bilateral and clearing of subungual debris. No ulceration, no infection noted.  Return Visit-Office Procedure: Patient instructed to return to the office for a follow up visit 3 months for continued evaluation and treatment.   Gardiner Barefoot DPM

## 2016-06-11 DIAGNOSIS — H524 Presbyopia: Secondary | ICD-10-CM | POA: Diagnosis not present

## 2016-06-11 DIAGNOSIS — H401112 Primary open-angle glaucoma, right eye, moderate stage: Secondary | ICD-10-CM | POA: Diagnosis not present

## 2016-06-27 ENCOUNTER — Ambulatory Visit
Admission: RE | Admit: 2016-06-27 | Discharge: 2016-06-27 | Disposition: A | Discharge status: Home / Self Care | Payer: Medicare Other | Source: Ambulatory Visit | Attending: Medical | Admitting: Medical

## 2016-06-27 ENCOUNTER — Encounter: Payer: Self-pay | Admitting: Medical

## 2016-06-27 ENCOUNTER — Ambulatory Visit (INDEPENDENT_AMBULATORY_CARE_PROVIDER_SITE_OTHER): Payer: Medicare Other | Admitting: Medical

## 2016-06-27 VITALS — BP 132/84 | HR 90 | Wt 144.0 lb

## 2016-06-27 DIAGNOSIS — M79643 Pain in unspecified hand: Secondary | ICD-10-CM | POA: Diagnosis not present

## 2016-06-27 DIAGNOSIS — N289 Disorder of kidney and ureter, unspecified: Secondary | ICD-10-CM

## 2016-06-27 DIAGNOSIS — M25539 Pain in unspecified wrist: Secondary | ICD-10-CM | POA: Diagnosis not present

## 2016-06-27 DIAGNOSIS — M19041 Primary osteoarthritis, right hand: Secondary | ICD-10-CM | POA: Diagnosis not present

## 2016-06-27 DIAGNOSIS — M79644 Pain in right finger(s): Secondary | ICD-10-CM | POA: Diagnosis not present

## 2016-06-27 DIAGNOSIS — M19031 Primary osteoarthritis, right wrist: Secondary | ICD-10-CM | POA: Diagnosis not present

## 2016-06-27 LAB — BASIC METABOLIC PANEL
BUN: 15 mg/dL (ref 7–25)
CALCIUM: 9.2 mg/dL (ref 8.6–10.4)
CO2: 27 mmol/L (ref 20–31)
CREATININE: 0.81 mg/dL (ref 0.60–0.88)
Chloride: 101 mmol/L (ref 98–110)
GLUCOSE: 90 mg/dL (ref 65–99)
Potassium: 4.3 mmol/L (ref 3.5–5.3)
SODIUM: 137 mmol/L (ref 135–146)

## 2016-06-27 NOTE — Progress Notes (Signed)
Subjective: Chief Complaint  Patient presents with  . hand pain    rt hand hurts worse then left but both hurt. started a month ago. thinking it is arthritis.    Here for pains in both hands.  She notes 3 week or so history of pain in right hand thumb, index finger, medial wrist, left wrist, mildly pain in left hand fingers.  No fall, no injury, no trauma.  Feels like the joints may have been swollen.  Has right hand stiffness in the morning, can last an hour.  Not so much in the left hand.  Is right handed, but having to use the left hand a lot now due to the pain.  Sometimes can't pick up a cup of coffee with the right hands.  No other joint pains reported.  Using Aspercreme and ibuprofen OTC. No other aggravating or relieving factors. No other complaint.  Past Medical History  Diagnosis Date  . Osteoporosis   . Cor pulmonale ([place])   . Asthma   . H/O bone density study 2008    osteoporosis, improved density on Fosamax at that time  . COPD (chronic obstructive pulmonary disease) ([place]) 01/2010    Dr. Melvyn Novas;  . Hypoxia 01/2013    hospitalization, Bipap therapy, COPD exacerbation  . Former smoker     quit 02/2013   ROS as in subjective   Objective: BP 132/84 mmHg  Pulse 90  Wt 144 lb (65.318 kg)  SpO2 98%  Gen: wd, wn, nad Right medial wrist with mobile cystic mass, 2cm suggestive of ganglion cyst, tender in same area, tender right thumb MCP, tender over 3rd right finger MCP and PIP, some bony arthritis changes of PIP and MCPs, otherwise nontender, no specific deviation, no other deformity Skin: mild purplish coloration left dorsal hand, suggestive of senile purpura Radial pulses 1+ Hands grip strength 4-5/5, otherwise normal hand and finger strength and sensation    Assessment: Encounter Diagnoses  Name Primary?  . Hand joint pain, unspecified laterality Yes  . Thumb pain, right   . Wrist joint pain, unspecified laterality   . Renal insufficiency       Plan: Can use OTC  tylenol, topical Aspercreme, go for xray, labs today.  discussed possible differential, but may just be OA and ganglion cyst of right wrist.     [name] was seen today for hand pain.  Diagnoses and all orders for this visit:  Hand joint pain, unspecified laterality -     Basic metabolic panel -     Sedimentation rate -     DG Hand Complete Right; Future -     DG Wrist Complete Right; Future  Thumb pain, right -     Basic metabolic panel -     Sedimentation rate -     DG Hand Complete Right; Future -     DG Wrist Complete Right; Future  Wrist joint pain, unspecified laterality -     Basic metabolic panel -     Sedimentation rate -     DG Hand Complete Right; Future -     DG Wrist Complete Right; Future  Renal insufficiency -     Basic metabolic panel -     Sedimentation rate -     DG Hand Complete Right; Future -     DG Wrist Complete Right; Future

## 2016-06-28 LAB — SEDIMENTATION RATE: SED RATE: 55 mm/h — AB (ref 0–30)

## 2016-08-06 DIAGNOSIS — H10013 Acute follicular conjunctivitis, bilateral: Secondary | ICD-10-CM | POA: Diagnosis not present

## 2016-08-14 ENCOUNTER — Encounter: Payer: Self-pay | Admitting: Podiatry

## 2016-08-14 ENCOUNTER — Ambulatory Visit (INDEPENDENT_AMBULATORY_CARE_PROVIDER_SITE_OTHER): Payer: Medicare Other | Admitting: Podiatry

## 2016-08-14 DIAGNOSIS — M79676 Pain in unspecified toe(s): Secondary | ICD-10-CM

## 2016-08-14 DIAGNOSIS — L84 Corns and callosities: Secondary | ICD-10-CM

## 2016-08-14 DIAGNOSIS — B351 Tinea unguium: Secondary | ICD-10-CM

## 2016-08-14 NOTE — Progress Notes (Addendum)
Patient ID: [name], female   DOB: [birth year], 82 y.o.   MRN: [id number] Complaint:  Visit Type: Patient returns to my office for continued preventative foot care services. Complaint: Patient states" my nails have grown long and thick and become painful to walk and wear shoes" . The patient presents for preventative foot care services. No changes to ROS.  She has painful callus under big toe joint left big toe.  Podiatric Exam: Vascular: dorsalis pedis and posterior tibial pulses are palpable bilateral. Capillary return is immediate. Temperature gradient is WNL. Skin turgor WNL  Sensorium: Diminished  Semmes Weinstein monofilament test. Normal tactile sensation bilaterally. Nail Exam: Pt has thick disfigured discolored nails with subungual debris noted bilateral entire nail hallux through fifth toenails Ulcer Exam: There is no evidence of ulcer or pre-ulcerative changes or infection. Orthopedic Exam: Muscle tone and strength are WNL. No limitations in general ROM. No crepitus or effusions noted. Foot type and digits show no abnormalities. Bony prominences are unremarkable. Skin: No Porokeratosis. No infection or ulcers.  Callus IPJ  Left hallux.  Diagnosis:  Pain due to onychomycosis  Callus left hallux.    Treatment & Plan Procedures and Treatment: Consent by patient was obtained for treatment procedures. The patient understood the discussion of treatment and procedures well. All questions were answered thoroughly reviewed. Debridement of mycotic and hypertrophic toenails, 1 through 5 bilateral and clearing of subungual debris. No ulceration, no infection noted. Debride callus left hallux. Return Visit-Office Procedure: Patient instructed to return to the office for a follow up visit 3 months for continued evaluation and treatment.   Gardiner Barefoot DPM

## 2016-08-30 ENCOUNTER — Telehealth: Payer: Self-pay | Admitting: Internal Medicine

## 2016-08-30 MED ORDER — FORMOTEROL FUMARATE 20 MCG/2ML IN NEBU: 20.0000 ug | INHALATION_SOLUTION | Freq: Two times a day (BID) | RESPIRATORY_TRACT | 5 refills | Status: DC

## 2016-08-30 MED ORDER — BUDESONIDE 0.25 MG/2ML IN SUSP: 0.2500 mg | Freq: Two times a day (BID) | RESPIRATORY_TRACT | 5 refills | Status: DC

## 2016-08-30 NOTE — Telephone Encounter (Signed)
Called and spoke with pt and she advised the the nebulizer meds needed to be sent in to [facility] for refills so these could be mailed to her.  These have been faxed and pt is aware.

## 2016-09-15 DIAGNOSIS — Z23 Encounter for immunization: Secondary | ICD-10-CM | POA: Diagnosis not present

## 2016-09-20 ENCOUNTER — Telehealth: Payer: Self-pay | Admitting: Internal Medicine

## 2016-09-20 MED ORDER — AZITHROMYCIN 250 MG PO TABS
ORAL_TABLET | ORAL | 0 refills | Status: DC
Start: 2016-09-20 — End: 2016-11-05

## 2016-09-20 MED ORDER — PREDNISONE 10 MG PO TABS: ORAL_TABLET | ORAL | 0 refills | Status: DC

## 2016-09-20 NOTE — Telephone Encounter (Signed)
zpak Prednisone 10 mg take  4 each am x 2 days,   2 each am x 2 days,  1 each am x 2 days and stop Ov if not better  To er if worse

## 2016-09-20 NOTE — Telephone Encounter (Signed)
PT c/o prod cough (green), sinus drainage (clear), wheezing.  Denies fever.  Taking Mucinex but states she has not had to take albuterol neb yet. Please advise.  Allergies  Allergen Reactions  . Augmentin [Amoxicillin-Pot Clavulanate] Nausea And Vomiting  . Levofloxacin Other (See Comments)    Lowered blood pressre

## 2016-09-20 NOTE — Telephone Encounter (Signed)
Called spoke with pt. Reviewed MW's recs and verified pharamcy as [facility] on [address]. She voiced understanding and had no further questions.

## 2016-11-05 ENCOUNTER — Telehealth: Payer: Self-pay | Admitting: Internal Medicine

## 2016-11-05 MED ORDER — AZITHROMYCIN 250 MG PO TABS: ORAL_TABLET | ORAL | 0 refills | Status: AC

## 2016-11-05 MED ORDER — PREDNISONE 10 MG PO TABS: ORAL_TABLET | ORAL | 0 refills | Status: DC

## 2016-11-05 NOTE — Telephone Encounter (Signed)
Spoke with pt. States that she is not feeling well. Reports coughing x2 days. Cough is producing yellow mucus. Denies chest tightness, wheezing, SOB or fever. Has been taking Mucinex DM with minimal relief. Pt can't come in for an appointment due to transportation issues. Would like to have something called in.  MW - please advise. Thanks.

## 2016-11-05 NOTE — Telephone Encounter (Signed)
Spoke with pt. She is aware of MW's recommendations. Rxs have been sent in. Nothing further was needed. 

## 2016-11-05 NOTE — Telephone Encounter (Signed)
zpak Prednisone 10 mg take  4 each am x 2 days,   2 each am x 2 days,  1 each am x 2 days and then resume whatever previous dose was if not zero  If not better ov with all meds and med calendar by end of week

## 2016-11-09 ENCOUNTER — Encounter: Payer: Self-pay | Admitting: Internal Medicine

## 2016-11-09 ENCOUNTER — Ambulatory Visit (INDEPENDENT_AMBULATORY_CARE_PROVIDER_SITE_OTHER): Payer: Medicare Other | Admitting: Internal Medicine

## 2016-11-09 VITALS — BP 152/84 | HR 69 | Ht 67.0 in | Wt 143.0 lb

## 2016-11-09 DIAGNOSIS — J9612 Chronic respiratory failure with hypercapnia: Secondary | ICD-10-CM | POA: Diagnosis not present

## 2016-11-09 DIAGNOSIS — J441 Chronic obstructive pulmonary disease with (acute) exacerbation: Secondary | ICD-10-CM | POA: Diagnosis not present

## 2016-11-09 DIAGNOSIS — J449 Chronic obstructive pulmonary disease, unspecified: Secondary | ICD-10-CM

## 2016-11-09 MED ORDER — UMECLIDINIUM BROMIDE 62.5 MCG/INH IN AEPB: 1.0000 | INHALATION_SPRAY | Freq: Every morning | RESPIRATORY_TRACT | 0 refills | Status: DC

## 2016-11-09 MED ORDER — PREDNISONE 10 MG PO TABS: ORAL_TABLET | ORAL | 5 refills | Status: DC

## 2016-11-09 NOTE — Progress Notes (Signed)
d  Subjective:    Patient ID: [name]     DOB: [birth year]     MRN: [id number]  Brief patient profile:  84yowf quit smoking 02/2013  referred 02/08/2012 by Glade Lloyd for intermittent sob was on 02 but stopped in early 2013 and with GOLD III COPD documented 03/2012     History of Present Illness  11/09/2016  f/u ov/Bronislaus Verdell re:   GOLD III / maint perforomist/ bud/Incruse/ prn saba neb  Chief Complaint  Patient presents with  . Follow-up    Pt called on 11/05/16 with c/o cough with yellow sputum- zpack and pred taper given. She is much improved and barely coughing at all at this point. Breathing is at her normal baseline.    baseline doe = MMRC3 = can't walk 100 yards even at a slow pace at a flat grade s stopping due to sob  Even on 02     No obvious day to day or daytime variability or assoc increase in baseline sob with min activity/ 02 requirements/mucus plugs or hemoptysis or cp or chest tightness, subjective wheeze or overt sinus or hb symptoms. No unusual exp hx or h/o childhood pna/ asthma or knowledge of premature birth.  Sleeping ok without nocturnal  or early am exacerbation  of respiratory  c/o's or need for noct saba. Also denies any obvious fluctuation of symptoms with weather or environmental changes or other aggravating or alleviating factors except as outlined above   Current Medications, Allergies, Complete Past Medical History, Past Surgical History, Family History, and Social History were reviewed in [facility] record.  ROS  The following are not active complaints unless bolded sore throat, dysphagia, dental problems, itching, sneezing,  nasal congestion or excess/ purulent secretions, ear ache,   fever, chills, sweats, unintended wt loss, classically pleuritic or exertional cp,  orthopnea pnd or leg swelling, presyncope, palpitations, abdominal pain, anorexia, nausea, vomiting, diarrhea  or change in bowel or bladder habits, change in stools or  urine, dysuria,hematuria,  rash, arthralgias, visual complaints, headache, numbness, weakness or ataxia or problems with walking or coordination,  change in mood/affect or memory.                     Objective:   Physical Exam   Thin hoarse amb wf nad with sats at rest RA 96%    Wt 97 02/08/2012 > 03/24/2012  99 > 103 05/09/2012 > 06/12/2012  99 >   09/24/2012  96 > 98 01/05/2013 >  106 05/01/2013 > 05/05/2013  99 > 05/15/2013  104 > 06/01/2013 115 > 115 07/07/2013 > 114  07/17/13 > 127 09/18/2013 > 12/01/2013  131 >135 03/04/2014 > 03/12/2014  133 > 05/27/2014  133  > 06/28/2014  133 >131 07/26/2014 >133 09/21/2014 139 >  04/14/2015  138  >  05/08/2016  146     HEENT mild turbinate edema.  Oropharynx no thrush or excess pnd or cobblestoning.  No JVD or cervical adenopathy. Mild accessory muscle hypertrophy. Trachea midline, nl thryroid. Chest was hyperinflated by percussion with diminished breath sounds, faint exp wheeze. Regular rate and rhythm without murmur gallop or rub or increase P2 edema resolved.  Abd: no hsm, nl excursion. Ext warm without cyanosis or clubbing.      CXR PA and Lateral:   05/08/2016 :    I personally reviewed images and agree with radiology impression as follows:     COPD. There is no pneumonia, CHF, nor other acute  cardiopulmonary abnormality.        Assessment & Plan:

## 2016-11-09 NOTE — Patient Instructions (Addendum)
For flares of cough/ wheeze/ short of breath assoc with change in mucus as you have in the past  >>> zpak / Prednisone 10 mg take  4 each am x 2 days,   2 each am x 2 days,  1 each am x 2 days and stop    Please schedule a follow up visit in 6 months but call sooner if needed

## 2016-11-11 ENCOUNTER — Encounter: Payer: Self-pay | Admitting: Internal Medicine

## 2016-11-11 NOTE — Assessment & Plan Note (Signed)
-   ono RA 05/14/12   4 h 33 min < 89%   So ordered 02 2lpm    - HCO3  32 05/10/13    - 01/05/2013   Walked RA x one lap @ 185 stopped due to  88%   - 01/05/2013  Walked 2lpm 2 laps @ 185 ft each stopped due to  90% sob but improved vs baseline   - RA 05/01/2013 sats = 87%  - 05/08/2016 > referred for POC eval    Rx= 02 2lpm 24/7  As of  11/09/2016  But ok to leave off at rest if monitoring sats

## 2016-11-11 NOTE — Assessment & Plan Note (Signed)
-   See admit 05/05/2013  - mild flare onset 05/04/16 > rx zpak only  - mild flare  11/05/16 rx zpak/ pred   Improved back to baseline

## 2016-11-11 NOTE — Assessment & Plan Note (Addendum)
-   PFTs 03/24/2012  FEV1  0.91 (43%) ratio 48 and no better p B2,  DLCO 38% corrects to 53%  - changed to perforomist/bud bid 04/2013  -2015 pulmonary rehab  - med calendar 07/17/13 > did not bring 06/28/2014 , .redone 07/26/2014  - 06/28/2014 p extensive coaching HFA effectiveness =    75% (short Ti) - 08/24/14 started IMPACT trial >finished 08/2015  - PFTs 12/13/14  FEV1 0.85 (38%) ratio 45  p 5 % improvement from saba  09/20/2015 Med calendar  - 05/08/2016 changed lama to incruse   I had an extended discussion with the patient reviewing all relevant studies completed to date and  lasting 15 to 20 minutes of a 25 minute visit on the following ongoing concerns:    Group D in terms of symptom/risk and laba/lama/ICS  therefore appropriate rx at this point.  She has very severe copd complicated by hypercarbic/hypoxemic resp failure but presently well compensated p a mild flare but managing it well using the action plan on on med calendar and zpak/ pred called in     Each maintenance medication was reviewed in detail including most importantly the difference between maintenance and as needed and under what circumstances the prns are to be used. This was done in the context of a medication calendar review which provided the patient with a user-friendly unambiguous mechanism for medication administration and reconciliation and provides an action plan for all active problems. It is critical that this be shown to every doctor  for modification during the office visit if necessary so the patient can use it as a working document.      Pulmonary f/u can be q  6 months

## 2016-11-20 ENCOUNTER — Ambulatory Visit (INDEPENDENT_AMBULATORY_CARE_PROVIDER_SITE_OTHER): Payer: Medicare Other | Admitting: Podiatry

## 2016-11-20 ENCOUNTER — Encounter: Payer: Self-pay | Admitting: Podiatry

## 2016-11-20 VITALS — Ht 67.0 in | Wt 143.0 lb

## 2016-11-20 DIAGNOSIS — B351 Tinea unguium: Secondary | ICD-10-CM | POA: Diagnosis not present

## 2016-11-20 DIAGNOSIS — M79676 Pain in unspecified toe(s): Secondary | ICD-10-CM | POA: Diagnosis not present

## 2016-11-20 NOTE — Progress Notes (Signed)
Patient ID: [name], female   DOB: 1932/12/22, 82 y.o.   MRN: [id number] Complaint:  Visit Type: Patient returns to my office for continued preventative foot care services. Complaint: Patient states" my nails have grown long and thick and become painful to walk and wear shoes" . The patient presents for preventative foot care services. No changes to ROS.  She has painful callus under big toe joint left big toe.  Podiatric Exam: Vascular: dorsalis pedis and posterior tibial pulses are palpable bilateral. Capillary return is immediate. Temperature gradient is WNL. Skin turgor WNL  Sensorium: Diminished  Semmes Weinstein monofilament test. Normal tactile sensation bilaterally. Nail Exam: Pt has thick disfigured discolored nails with subungual debris noted bilateral entire nail hallux through fifth toenails Ulcer Exam: There is no evidence of ulcer or pre-ulcerative changes or infection. Orthopedic Exam: Muscle tone and strength are WNL. No limitations in general ROM. No crepitus or effusions noted. Foot type and digits show no abnormalities. Bony prominences are unremarkable. Skin: No Porokeratosis. No infection or ulcers.  Callus IPJ  Left hallux.  Diagnosis:  Pain due to onychomycosis  Callus left hallux.    Treatment & Plan Procedures and Treatment: Consent by patient was obtained for treatment procedures. The patient understood the discussion of treatment and procedures well. All questions were answered thoroughly reviewed. Debridement of mycotic and hypertrophic toenails, 1 through 5 bilateral and clearing of subungual debris. No ulceration, no infection noted. Debride callus left hallux. Return Visit-Office Procedure: Patient instructed to return to the office for a follow up visit 3 months for continued evaluation and treatment.   Gardiner Barefoot DPM

## 2016-11-30 ENCOUNTER — Other Ambulatory Visit: Payer: Self-pay

## 2016-12-03 ENCOUNTER — Telehealth: Payer: Self-pay | Admitting: Internal Medicine

## 2016-12-03 NOTE — Telephone Encounter (Signed)
Spoke with pt, aware that we do not have incruse samples in the office at this time.  Nothing further needed.

## 2016-12-12 DIAGNOSIS — H401112 Primary open-angle glaucoma, right eye, moderate stage: Secondary | ICD-10-CM | POA: Diagnosis not present

## 2017-01-30 DIAGNOSIS — T161XXA Foreign body in right ear, initial encounter: Secondary | ICD-10-CM | POA: Insufficient documentation

## 2017-01-30 DIAGNOSIS — H6123 Impacted cerumen, bilateral: Secondary | ICD-10-CM | POA: Insufficient documentation

## 2017-02-19 ENCOUNTER — Encounter: Payer: Self-pay | Admitting: Podiatry

## 2017-02-19 ENCOUNTER — Ambulatory Visit (INDEPENDENT_AMBULATORY_CARE_PROVIDER_SITE_OTHER): Payer: Medicare Other | Admitting: Podiatry

## 2017-02-19 DIAGNOSIS — M79676 Pain in unspecified toe(s): Secondary | ICD-10-CM

## 2017-02-19 DIAGNOSIS — M79606 Pain in leg, unspecified: Secondary | ICD-10-CM

## 2017-02-19 DIAGNOSIS — B351 Tinea unguium: Secondary | ICD-10-CM

## 2017-02-19 NOTE — Progress Notes (Signed)
Patient ID: [name], female   DOB: October 30, 1932, 83 y.o.   MRN: [id number] Complaint:  Visit Type: Patient returns to my office for continued preventative foot care services. Complaint: Patient states" my nails have grown long and thick and become painful to walk and wear shoes" . The patient presents for preventative foot care services. No changes to ROS.  She has painful callus under big toe joint left big toe.  Podiatric Exam: Vascular: dorsalis pedis and posterior tibial pulses are palpable bilateral. Capillary return is immediate. Temperature gradient is WNL. Skin turgor WNL  Sensorium: Diminished  Semmes Weinstein monofilament test. Normal tactile sensation bilaterally. Nail Exam: Pt has thick disfigured discolored nails with subungual debris noted bilateral entire nail hallux through fifth toenails Ulcer Exam: There is no evidence of ulcer or pre-ulcerative changes or infection. Orthopedic Exam: Muscle tone and strength are WNL. No limitations in general ROM. No crepitus or effusions noted. Foot type and digits show no abnormalities. Bony prominences are unremarkable. Skin: No Porokeratosis. No infection or ulcers.  Callus IPJ  Left hallux.  Diagnosis:  Pain due to onychomycosis  Callus left hallux.    Treatment & Plan Procedures and Treatment: Consent by patient was obtained for treatment procedures. The patient understood the discussion of treatment and procedures well. All questions were answered thoroughly reviewed. Debridement of mycotic and hypertrophic toenails, 1 through 5 bilateral and clearing of subungual debris. No ulceration, no infection noted. Debride callus left hallux. Return Visit-Office Procedure: Patient instructed to return to the office for a follow up visit 3 months for continued evaluation and treatment.   Gardiner Barefoot DPM

## 2017-04-08 ENCOUNTER — Telehealth: Payer: Self-pay | Admitting: Internal Medicine

## 2017-04-08 NOTE — Telephone Encounter (Signed)
Pt aware that these forms were faxed on 04/01/17. Pt states that she received a letter from [employer] on 04/06/17 stating that they have not received these forms and that if they are not faxed by 04/16/17 then she will be taken off the Medical Alert Program. Pt aware that these have been faxed again. Advised to check with [employer] later today and to let us know if they still have not received these. Originals placed back in MW scan folder and copies are in triage and will be held x 1 day. Nothing further needed.

## 2017-04-18 ENCOUNTER — Telehealth: Payer: Self-pay | Admitting: Internal Medicine

## 2017-04-18 NOTE — Telephone Encounter (Signed)
Late entry  [id number]: [employer] IMPACT research study: blinded >/= 52 week randomized trial of ANORO equivalent v BREO equivalent v GSK triple MDI (ICS + LABA + LAMA). Sponsored by [employer]    [name] [birth year] was study subject in above study. During this study she did not have  Visit 3 - 09/21/14, visit 5 - 03/10/15, visit 7 - 08/31/15 - she had EKG before IP, did not have EKG and had EKG before IP - respectively. These were protocol deviations but did not impact subject safety or data integrity. Subject was appreciative of disclosure.   Dr. Brand Males, M.D., [facility].C.P Pulmonary and Critical Care Medicine Staff Physician [facility] Pager: [phone], If no answer or between  15:00h - 7:00h: call [phone]  04/18/2017 1:26 PM

## 2017-05-10 ENCOUNTER — Encounter: Payer: Self-pay | Admitting: Internal Medicine

## 2017-05-10 ENCOUNTER — Ambulatory Visit (INDEPENDENT_AMBULATORY_CARE_PROVIDER_SITE_OTHER): Payer: Medicare Other | Admitting: Internal Medicine

## 2017-05-10 VITALS — BP 122/80 | HR 90 | Ht 67.0 in | Wt 145.0 lb

## 2017-05-10 DIAGNOSIS — J449 Chronic obstructive pulmonary disease, unspecified: Secondary | ICD-10-CM

## 2017-05-10 DIAGNOSIS — J9612 Chronic respiratory failure with hypercapnia: Secondary | ICD-10-CM | POA: Diagnosis not present

## 2017-05-10 NOTE — Patient Instructions (Signed)
Please see patient coordinator before you leave today  to schedule ambulatory 02 titration to see if eligible for POC    Please schedule a follow up visit in 6  months but call sooner if needed

## 2017-05-10 NOTE — Assessment & Plan Note (Addendum)
-   ono RA 05/14/12   4 h 33 min < 89%   So ordered 02 2lpm    - HCO3  32 05/10/13    - 01/05/2013   Walked RA x one lap @ 185 stopped due to  88%   - 01/05/2013  Walked 2lpm 2 laps @ 185 ft each stopped due to  90% sob but improved vs baseline   - RA 05/01/2013 sats = 87%  - 05/10/2017 > 02 sats 88% RA > referred for POC eval    Rx= 02 2lpm 24/7  As of  05/10/2017

## 2017-05-10 NOTE — Progress Notes (Signed)
d  Subjective:    Patient ID: [name]     DOB: Apr 09, 1932     MRN: [id number]  Brief patient profile:  84yowf quit smoking 02/2013  referred 02/08/2012 by [name] for intermittent sob was on 02 but stopped in early 2013 and with GOLD III COPD documented 03/2012     History of Present Illness  11/09/2016  f/u ov/[name] re:   GOLD III / maint perforomist/ bud/Incruse/ prn saba neb  Chief Complaint  Patient presents with  . Follow-up    Pt called on 11/05/16 with c/o cough with yellow sputum- zpack and pred taper given. She is much improved and barely coughing at all at this point. Breathing is at her normal baseline.    baseline doe = MMRC3 = can't walk 100 yards even at a slow pace at a flat grade s stopping due to sob  Even on 02  rec For flares of cough/ wheeze/ short of breath assoc with change in mucus as you have in the past  >>> zpak / Prednisone 10 mg take  4 each am x 2 days,   2 each am x 2 days,  1 each am x 2 days and stop    05/10/2017  f/u ov/[name] re: COPD III/ maint performist/bud/ incruse 2lpm 24/7/ no need for saba at all, did not use zpak/pred Chief Complaint  Patient presents with  . Follow-up   no change doe = MMRC3   - [facility] then has to sit down   No obvious day to day or daytime variability or assoc excess/ purulent sputum or mucus plugs or hemoptysis or cp or chest tightness, subjective wheeze or overt sinus or hb symptoms. No unusual exp hx or h/o childhood pna/ asthma or knowledge of premature birth.  Sleeping ok without nocturnal  or early am exacerbation  of respiratory  c/o's or need for noct saba. Also denies any obvious fluctuation of symptoms with weather or environmental changes or other aggravating or alleviating factors except as outlined above   Current Medications, Allergies, Complete Past Medical History, Past Surgical History, Family History, and Social History were reviewed in [employer]  record.  ROS  The following are not active complaints unless bolded sore throat, dysphagia, dental problems, itching, sneezing,  nasal congestion or excess/ purulent secretions, ear ache,   fever, chills, sweats, unintended wt loss, classically pleuritic or exertional cp,  orthopnea pnd or leg swelling, presyncope, palpitations, abdominal pain, anorexia, nausea, vomiting, diarrhea  or change in bowel or bladder habits, change in stools or urine, dysuria,hematuria,  rash, arthralgias, visual complaints, headache, numbness, weakness or ataxia or problems with walking or coordination,  change in mood/affect or memory.                Objective:   Physical Exam   Thin hoarse amb wf nad with sats at rest RA 98% RA  Wt 97 02/08/2012 > 03/24/2012  99 > 103 05/09/2012 > 06/12/2012  99 >   09/24/2012  96 > 98 01/05/2013 >  106 05/01/2013 > 05/05/2013  99 > 05/15/2013  104 > 06/01/2013 115 > 115 07/07/2013 > 114  07/17/13 > 127 09/18/2013 > 12/01/2013  131 >135 03/04/2014 > 03/12/2014  133 > 05/27/2014  133  > 06/28/2014  133 >131 07/26/2014 >133 09/21/2014 139 >  04/14/2015  138  >  05/08/2016  146 > 05/10/2017    145    HEENT: nl dentition,  turbinates bilaterally, and oropharynx ok s thrush. Nl external ear canals without cough reflex   NECK :  without JVD/Nodes/TM/ nl carotid upstrokes bilaterally   LUNGS: no acc muscle use,  Barrel contour chest with very distant bs bilaterally and hyperresonant to percussion    CV:  RRR  no s3 or murmur or increase in P2, and no edema   ABD:  soft and nontender with nl inspiratory excursion in the supine position. No bruits or organomegaly appreciated, bowel sounds nl  MS:  Nl gait/ ext warm without deformities, calf tenderness, cyanosis or clubbing No obvious joint restrictions   SKIN: warm and dry without lesions    NEURO:  alert, approp, nl sensorium with  no motor or cerebellar deficits apparent.                    Assessment & Plan:

## 2017-05-10 NOTE — Assessment & Plan Note (Signed)
-   PFTs 03/24/2012  FEV1  0.91 (43%) ratio 48 and no better p B2,  DLCO 38% corrects to 53%  - changed to perforomist/bud bid 04/2013  -2015 pulmonary rehab  - med calendar 07/17/13 > did not bring 06/28/2014 , .redone 07/26/2014  - 06/28/2014 p extensive coaching HFA effectiveness =    75% (short Ti) - 08/24/14 started IMPACT trial >finished 08/2015  - PFTs 12/13/14  FEV1 0.85 (38%) ratio 45  p 5 % improvement from saba  09/20/2015 Med calendar  - 05/08/2016 changed lama to incruse    Despite severe copd she has stabilized on present albeit complex rx.  Though somewhat paradoxic, when the lung fails to clear C02 properly and pC02 rises the lung then becomes a more efficient scavenger of C02 allowing lower work of breathing and  better C02 clearance albeit at a higher serum pC02 level - this is why pts can look a lot better than their ABG's would suggest and why it's so difficult to prognosticate endstage dz.  It's also why I strongly rec DNI status (ventilating pts down to a nl pC02 adversely affects this compensatory mechanism)   Therefore no change in rx needed   I had an extended discussion with the patient reviewing all relevant studies completed to date and  lasting 15 to 20 minutes of a 25 minute visit    Each maintenance medication was reviewed in detail including most importantly the difference between maintenance and prns and under what circumstances the prns are to be triggered using an action plan format that is not reflected in the computer generated alphabetically organized AVS but trather by a customized med calendar that reflects the AVS meds with confirmed 100% correlation.   In addition, Please see AVS for unique instructions that I personally wrote and verbalized to the the pt in detail and then reviewed with pt  by my nurse highlighting any  changes in therapy recommended at today's visit to their plan of care.

## 2017-05-21 ENCOUNTER — Ambulatory Visit (INDEPENDENT_AMBULATORY_CARE_PROVIDER_SITE_OTHER): Payer: Medicare Other | Admitting: Podiatry

## 2017-05-21 ENCOUNTER — Encounter: Payer: Self-pay | Admitting: Podiatry

## 2017-05-21 DIAGNOSIS — B351 Tinea unguium: Secondary | ICD-10-CM

## 2017-05-21 DIAGNOSIS — M79676 Pain in unspecified toe(s): Secondary | ICD-10-CM

## 2017-05-21 NOTE — Progress Notes (Signed)
Patient ID: [name], female   DOB: 10/14/1932, 84 y.o.   MRN: [id number] Complaint:  Visit Type: Patient returns to my office for continued preventative foot care services. Complaint: Patient states" my nails have grown long and thick and become painful to walk and wear shoes" . The patient presents for preventative foot care services. No changes to ROS.    Podiatric Exam: Vascular: dorsalis pedis and posterior tibial pulses are palpable bilateral. Capillary return is immediate. Temperature gradient is WNL. Skin turgor WNL  Sensorium: Diminished  Semmes Weinstein monofilament test. Normal tactile sensation bilaterally. Nail Exam: Pt has thick disfigured discolored nails with subungual debris noted bilateral entire nail hallux through fifth toenails Ulcer Exam: There is no evidence of ulcer or pre-ulcerative changes or infection. Orthopedic Exam: Muscle tone and strength are WNL. No limitations in general ROM. No crepitus or effusions noted. Foot type and digits show no abnormalities. Bony prominences are unremarkable. Skin: No Porokeratosis. No infection or ulcers.   Diagnosis:  Pain due to onychomycosis     Treatment & Plan Procedures and Treatment: Consent by patient was obtained for treatment procedures. The patient understood the discussion of treatment and procedures well. All questions were answered thoroughly reviewed. Debridement of mycotic and hypertrophic toenails, 1 through 5 bilateral and clearing of subungual debris. No ulceration, no infection noted.  Return Visit-Office Procedure: Patient instructed to return to the office for a follow up visit 3 months for continued evaluation and treatment.   Roxsana Riding DPM 

## 2017-05-22 ENCOUNTER — Encounter: Payer: Self-pay | Admitting: Medical

## 2017-05-22 ENCOUNTER — Ambulatory Visit (INDEPENDENT_AMBULATORY_CARE_PROVIDER_SITE_OTHER): Payer: Medicare Other | Admitting: Medical

## 2017-05-22 VITALS — BP 118/74 | HR 104 | Temp 98.1°F | Wt 142.2 lb

## 2017-05-22 DIAGNOSIS — M791 Myalgia: Secondary | ICD-10-CM | POA: Diagnosis not present

## 2017-05-22 DIAGNOSIS — M5432 Sciatica, left side: Secondary | ICD-10-CM

## 2017-05-22 DIAGNOSIS — M7918 Myalgia, other site: Secondary | ICD-10-CM | POA: Insufficient documentation

## 2017-05-22 NOTE — Progress Notes (Signed)
Subjective: Chief Complaint  Patient presents with  . Back Pain    hurting in lower back x 66month  comes and goes    Here for pain in left low back x 3 months or so.  Intermittent. Only hurts first thing in the morning, left buttock.   Walking makes it feel better.  No hip pain.  No fall, no injury , no heavy lifting.  No urinary symptoms, no burning, no dysuria,no frequency or urgency.  No GI issues, no NVD, no constipation, no blood in stool.   No change in breathing or cough.  Is on oxygen full time.   Does have some funny feeling in left leg at times .  No fever. Uses some tylenol.  Not really exercising.  Is home bound.  No recent weight loss, sweats. No other aggravating or relieving factors. No other complaint  Past Medical History:  Diagnosis Date  . Asthma   . COPD (chronic obstructive pulmonary disease) ([place]) 01/2010   Dr. Melvyn Novas;  . Cor pulmonale [facility])   . Former smoker    quit 02/2013  . H/O bone density study 2008   osteoporosis, improved density on Fosamax at that time  . Hypoxia 01/2013   hospitalization, Bipap therapy, COPD exacerbation  . Osteoporosis    Current Outpatient Prescriptions on File Prior to Visit  Medication Sig Dispense Refill  . acetaminophen (TYLENOL) 650 MG CR tablet Take 650 mg by mouth every 8 (eight) hours as needed for pain.    Marland Kitchen albuterol (PROVENTIL HFA;VENTOLIN HFA) 108 (90 Base) MCG/ACT inhaler Inhale 2 puffs into the lungs every 4 (four) hours as needed for wheezing or shortness of breath (((PLAN A))). Reported on 01/12/2016 1 Inhaler 2  . albuterol (PROVENTIL) (5 MG/ML) 0.5% nebulizer solution Take 2.5 mg by nebulization every 4 (four) hours as needed for wheezing or shortness of breath ((( PLAN B))). Reported on 01/12/2016    . brimonidine (ALPHAGAN) 0.15 % ophthalmic solution Place 1 drop into the right eye 3 (three) times daily.     . budesonide (PULMICORT) 0.25 MG/2ML nebulizer solution Take 2 mLs (0.25 mg total) by nebulization 2 (two) times daily.  60 mL 5  . famotidine (PEPCID) 20 MG tablet Take 20 mg by mouth 2 (two) times daily. Reported on 01/12/2016    . formoterol (PERFOROMIST) 20 MCG/2ML nebulizer solution Take 2 mLs (20 mcg total) by nebulization 2 (two) times daily. 60 mL 5  . OXYGEN Use 2L of O2 continuously    . Probiotic Product (PROBIOTIC DAILY PO) Take 1 tablet by mouth every morning. Reported on 06/27/2016    . umeclidinium bromide (INCRUSE ELLIPTA) 62.5 MCG/INH AEPB Inhale 1 puff into the lungs every morning. 7 each 0  . cetirizine (ZYRTEC) 10 MG tablet Take 10 mg by mouth at bedtime. Reported on 06/27/2016    . dextromethorphan-guaiFENesin (MUCINEX DM) 30-600 MG per 12 hr tablet Take 1-2 tablets by mouth every 12 (twelve) hours as needed (with flutter for cough, congestion, and thick mucus). Reported on 06/27/2016    . predniSONE (DELTASONE) 10 MG tablet Take 4 each am x 2 days, 2 each am x 2 days, 1 each am x 2 days and stop (Patient not taking: Reported on 05/22/2017) 14 tablet 5   Current Facility-Administered Medications on File Prior to Visit  Medication Dose Route Frequency Provider Last Rate Last Dose  . influenza  inactive virus vaccine (FLUZONE/FLUARIX) injection 0.5 mL  0.5 mL Intramuscular Once Denita Lung, MD  ROS as in subjective   Objective: BP 118/74   Pulse (!) 104   Temp 98.1 F (36.7 C)   Wt 142 lb 3.2 oz (64.5 kg)   SpO2 94%   BMI 22.27 kg/m   Wt Readings from Last 3 Encounters:  05/22/17 142 lb 3.2 oz (64.5 kg)  05/10/17 145 lb (65.8 kg)  11/20/16 143 lb (64.9 kg)   General appearance: alert, no distress, WD/WN, lean white female, on oxygen, nasal cannula Abdomen: +bs, soft, non tender, non distended, no masses, no hepatomegaly, no splenomegaly Back: non tender, no pain with ROM, but ROM is limited somewhat likely due to lack of flexibility Musculoskeletal: mild tenderness left sciatic notch, otherwise hips and legs, nontender, normal ROM, otherwise LE nontender, no swelling, no obvious  deformity Extremities: no edema, no cyanosis, no clubbing Pulses: 2+ symmetric, upper and lower extremities, normal cap refill Neurological:leg strength and sensation WNL No rash, bruising or erythema   Assessment: Encounter Diagnoses  Name Primary?  . Sciatica, left side Yes  . Buttock pain     Plan: Discussed differential, but likely mild sciatica vs musculoskeletal back pain.  She is not exercising or stretching.   Advised she do a daily stretching routine.  Will send out home health physical therapy to do a few visits with her to get her up and stretching.  If pain doesn't resolve, next step would be some baseline imaging of hip/pelvis/lumbar spine.   Can use tylenol prn.   F/u pending PT  Kristelle was seen today for back pain.  Diagnoses and all orders for this visit:  Sciatica, left side -     Ambulatory referral to Physical Therapy  Buttock pain -     Ambulatory referral to Physical Therapy

## 2017-05-23 ENCOUNTER — Telehealth: Payer: Self-pay

## 2017-05-23 NOTE — Telephone Encounter (Signed)
Gave order to well care to start home p/t

## 2017-05-28 DIAGNOSIS — J9612 Chronic respiratory failure with hypercapnia: Secondary | ICD-10-CM | POA: Diagnosis not present

## 2017-05-28 DIAGNOSIS — M791 Myalgia: Secondary | ICD-10-CM | POA: Diagnosis not present

## 2017-05-28 DIAGNOSIS — M810 Age-related osteoporosis without current pathological fracture: Secondary | ICD-10-CM | POA: Diagnosis not present

## 2017-05-28 DIAGNOSIS — M25561 Pain in right knee: Secondary | ICD-10-CM | POA: Diagnosis not present

## 2017-05-28 DIAGNOSIS — Z9181 History of falling: Secondary | ICD-10-CM | POA: Diagnosis not present

## 2017-05-28 DIAGNOSIS — M5432 Sciatica, left side: Secondary | ICD-10-CM | POA: Diagnosis not present

## 2017-05-28 DIAGNOSIS — J449 Chronic obstructive pulmonary disease, unspecified: Secondary | ICD-10-CM | POA: Diagnosis not present

## 2017-05-28 DIAGNOSIS — I2781 Cor pulmonale (chronic): Secondary | ICD-10-CM | POA: Diagnosis not present

## 2017-05-28 DIAGNOSIS — Z9981 Dependence on supplemental oxygen: Secondary | ICD-10-CM | POA: Diagnosis not present

## 2017-06-02 ENCOUNTER — Other Ambulatory Visit: Payer: Self-pay | Admitting: Internal Medicine

## 2017-06-02 DIAGNOSIS — J441 Chronic obstructive pulmonary disease with (acute) exacerbation: Secondary | ICD-10-CM

## 2017-06-04 DIAGNOSIS — M5432 Sciatica, left side: Secondary | ICD-10-CM | POA: Diagnosis not present

## 2017-06-04 DIAGNOSIS — J449 Chronic obstructive pulmonary disease, unspecified: Secondary | ICD-10-CM | POA: Diagnosis not present

## 2017-06-04 DIAGNOSIS — M791 Myalgia: Secondary | ICD-10-CM | POA: Diagnosis not present

## 2017-06-04 DIAGNOSIS — J9612 Chronic respiratory failure with hypercapnia: Secondary | ICD-10-CM | POA: Diagnosis not present

## 2017-06-04 DIAGNOSIS — I2781 Cor pulmonale (chronic): Secondary | ICD-10-CM | POA: Diagnosis not present

## 2017-06-04 DIAGNOSIS — M25561 Pain in right knee: Secondary | ICD-10-CM | POA: Diagnosis not present

## 2017-06-05 DIAGNOSIS — J9612 Chronic respiratory failure with hypercapnia: Secondary | ICD-10-CM | POA: Diagnosis not present

## 2017-06-05 DIAGNOSIS — I2781 Cor pulmonale (chronic): Secondary | ICD-10-CM | POA: Diagnosis not present

## 2017-06-05 DIAGNOSIS — M5432 Sciatica, left side: Secondary | ICD-10-CM | POA: Diagnosis not present

## 2017-06-05 DIAGNOSIS — M25561 Pain in right knee: Secondary | ICD-10-CM | POA: Diagnosis not present

## 2017-06-05 DIAGNOSIS — J449 Chronic obstructive pulmonary disease, unspecified: Secondary | ICD-10-CM | POA: Diagnosis not present

## 2017-06-05 DIAGNOSIS — M791 Myalgia: Secondary | ICD-10-CM | POA: Diagnosis not present

## 2017-06-10 DIAGNOSIS — J9612 Chronic respiratory failure with hypercapnia: Secondary | ICD-10-CM | POA: Diagnosis not present

## 2017-06-10 DIAGNOSIS — J449 Chronic obstructive pulmonary disease, unspecified: Secondary | ICD-10-CM | POA: Diagnosis not present

## 2017-06-10 DIAGNOSIS — M791 Myalgia: Secondary | ICD-10-CM | POA: Diagnosis not present

## 2017-06-10 DIAGNOSIS — I2781 Cor pulmonale (chronic): Secondary | ICD-10-CM | POA: Diagnosis not present

## 2017-06-10 DIAGNOSIS — M5432 Sciatica, left side: Secondary | ICD-10-CM | POA: Diagnosis not present

## 2017-06-10 DIAGNOSIS — M25561 Pain in right knee: Secondary | ICD-10-CM | POA: Diagnosis not present

## 2017-06-12 DIAGNOSIS — M791 Myalgia: Secondary | ICD-10-CM | POA: Diagnosis not present

## 2017-06-12 DIAGNOSIS — I2781 Cor pulmonale (chronic): Secondary | ICD-10-CM | POA: Diagnosis not present

## 2017-06-12 DIAGNOSIS — M25561 Pain in right knee: Secondary | ICD-10-CM | POA: Diagnosis not present

## 2017-06-12 DIAGNOSIS — J449 Chronic obstructive pulmonary disease, unspecified: Secondary | ICD-10-CM | POA: Diagnosis not present

## 2017-06-12 DIAGNOSIS — J9612 Chronic respiratory failure with hypercapnia: Secondary | ICD-10-CM | POA: Diagnosis not present

## 2017-06-12 DIAGNOSIS — M5432 Sciatica, left side: Secondary | ICD-10-CM | POA: Diagnosis not present

## 2017-06-18 DIAGNOSIS — M791 Myalgia: Secondary | ICD-10-CM | POA: Diagnosis not present

## 2017-06-18 DIAGNOSIS — M25561 Pain in right knee: Secondary | ICD-10-CM | POA: Diagnosis not present

## 2017-06-18 DIAGNOSIS — J9612 Chronic respiratory failure with hypercapnia: Secondary | ICD-10-CM | POA: Diagnosis not present

## 2017-06-18 DIAGNOSIS — I2781 Cor pulmonale (chronic): Secondary | ICD-10-CM | POA: Diagnosis not present

## 2017-06-18 DIAGNOSIS — J449 Chronic obstructive pulmonary disease, unspecified: Secondary | ICD-10-CM | POA: Diagnosis not present

## 2017-06-18 DIAGNOSIS — M5432 Sciatica, left side: Secondary | ICD-10-CM | POA: Diagnosis not present

## 2017-06-20 DIAGNOSIS — J449 Chronic obstructive pulmonary disease, unspecified: Secondary | ICD-10-CM | POA: Diagnosis not present

## 2017-06-20 DIAGNOSIS — M791 Myalgia: Secondary | ICD-10-CM | POA: Diagnosis not present

## 2017-06-20 DIAGNOSIS — M25561 Pain in right knee: Secondary | ICD-10-CM | POA: Diagnosis not present

## 2017-06-20 DIAGNOSIS — J9612 Chronic respiratory failure with hypercapnia: Secondary | ICD-10-CM | POA: Diagnosis not present

## 2017-06-20 DIAGNOSIS — I2781 Cor pulmonale (chronic): Secondary | ICD-10-CM | POA: Diagnosis not present

## 2017-06-20 DIAGNOSIS — M5432 Sciatica, left side: Secondary | ICD-10-CM | POA: Diagnosis not present

## 2017-06-25 DIAGNOSIS — I2781 Cor pulmonale (chronic): Secondary | ICD-10-CM | POA: Diagnosis not present

## 2017-06-25 DIAGNOSIS — M25561 Pain in right knee: Secondary | ICD-10-CM | POA: Diagnosis not present

## 2017-06-25 DIAGNOSIS — M5432 Sciatica, left side: Secondary | ICD-10-CM | POA: Diagnosis not present

## 2017-06-25 DIAGNOSIS — M791 Myalgia: Secondary | ICD-10-CM | POA: Diagnosis not present

## 2017-06-25 DIAGNOSIS — J9612 Chronic respiratory failure with hypercapnia: Secondary | ICD-10-CM | POA: Diagnosis not present

## 2017-06-25 DIAGNOSIS — J449 Chronic obstructive pulmonary disease, unspecified: Secondary | ICD-10-CM | POA: Diagnosis not present

## 2017-06-28 DIAGNOSIS — I2781 Cor pulmonale (chronic): Secondary | ICD-10-CM | POA: Diagnosis not present

## 2017-06-28 DIAGNOSIS — M5432 Sciatica, left side: Secondary | ICD-10-CM | POA: Diagnosis not present

## 2017-06-28 DIAGNOSIS — J449 Chronic obstructive pulmonary disease, unspecified: Secondary | ICD-10-CM | POA: Diagnosis not present

## 2017-06-28 DIAGNOSIS — M25561 Pain in right knee: Secondary | ICD-10-CM | POA: Diagnosis not present

## 2017-06-28 DIAGNOSIS — J9612 Chronic respiratory failure with hypercapnia: Secondary | ICD-10-CM | POA: Diagnosis not present

## 2017-06-28 DIAGNOSIS — M791 Myalgia: Secondary | ICD-10-CM | POA: Diagnosis not present

## 2017-07-02 DIAGNOSIS — J9612 Chronic respiratory failure with hypercapnia: Secondary | ICD-10-CM | POA: Diagnosis not present

## 2017-07-02 DIAGNOSIS — M791 Myalgia: Secondary | ICD-10-CM | POA: Diagnosis not present

## 2017-07-02 DIAGNOSIS — J449 Chronic obstructive pulmonary disease, unspecified: Secondary | ICD-10-CM | POA: Diagnosis not present

## 2017-07-02 DIAGNOSIS — M5432 Sciatica, left side: Secondary | ICD-10-CM | POA: Diagnosis not present

## 2017-07-02 DIAGNOSIS — M25561 Pain in right knee: Secondary | ICD-10-CM | POA: Diagnosis not present

## 2017-07-02 DIAGNOSIS — I2781 Cor pulmonale (chronic): Secondary | ICD-10-CM | POA: Diagnosis not present

## 2017-07-04 DIAGNOSIS — M25561 Pain in right knee: Secondary | ICD-10-CM | POA: Diagnosis not present

## 2017-07-04 DIAGNOSIS — J449 Chronic obstructive pulmonary disease, unspecified: Secondary | ICD-10-CM | POA: Diagnosis not present

## 2017-07-04 DIAGNOSIS — M5432 Sciatica, left side: Secondary | ICD-10-CM | POA: Diagnosis not present

## 2017-07-04 DIAGNOSIS — M791 Myalgia: Secondary | ICD-10-CM | POA: Diagnosis not present

## 2017-07-04 DIAGNOSIS — J9612 Chronic respiratory failure with hypercapnia: Secondary | ICD-10-CM | POA: Diagnosis not present

## 2017-07-04 DIAGNOSIS — I2781 Cor pulmonale (chronic): Secondary | ICD-10-CM | POA: Diagnosis not present

## 2017-07-09 DIAGNOSIS — M25561 Pain in right knee: Secondary | ICD-10-CM | POA: Diagnosis not present

## 2017-07-09 DIAGNOSIS — M791 Myalgia: Secondary | ICD-10-CM | POA: Diagnosis not present

## 2017-07-09 DIAGNOSIS — J449 Chronic obstructive pulmonary disease, unspecified: Secondary | ICD-10-CM | POA: Diagnosis not present

## 2017-07-09 DIAGNOSIS — M5432 Sciatica, left side: Secondary | ICD-10-CM | POA: Diagnosis not present

## 2017-07-09 DIAGNOSIS — I2781 Cor pulmonale (chronic): Secondary | ICD-10-CM | POA: Diagnosis not present

## 2017-07-09 DIAGNOSIS — J9612 Chronic respiratory failure with hypercapnia: Secondary | ICD-10-CM | POA: Diagnosis not present

## 2017-07-11 DIAGNOSIS — I2781 Cor pulmonale (chronic): Secondary | ICD-10-CM | POA: Diagnosis not present

## 2017-07-11 DIAGNOSIS — J9612 Chronic respiratory failure with hypercapnia: Secondary | ICD-10-CM | POA: Diagnosis not present

## 2017-07-11 DIAGNOSIS — M25561 Pain in right knee: Secondary | ICD-10-CM | POA: Diagnosis not present

## 2017-07-11 DIAGNOSIS — M791 Myalgia: Secondary | ICD-10-CM | POA: Diagnosis not present

## 2017-07-11 DIAGNOSIS — M5432 Sciatica, left side: Secondary | ICD-10-CM | POA: Diagnosis not present

## 2017-07-11 DIAGNOSIS — J449 Chronic obstructive pulmonary disease, unspecified: Secondary | ICD-10-CM | POA: Diagnosis not present

## 2017-07-16 DIAGNOSIS — M791 Myalgia: Secondary | ICD-10-CM | POA: Diagnosis not present

## 2017-07-16 DIAGNOSIS — I2781 Cor pulmonale (chronic): Secondary | ICD-10-CM | POA: Diagnosis not present

## 2017-07-16 DIAGNOSIS — M5432 Sciatica, left side: Secondary | ICD-10-CM | POA: Diagnosis not present

## 2017-07-16 DIAGNOSIS — J9612 Chronic respiratory failure with hypercapnia: Secondary | ICD-10-CM | POA: Diagnosis not present

## 2017-07-16 DIAGNOSIS — J449 Chronic obstructive pulmonary disease, unspecified: Secondary | ICD-10-CM | POA: Diagnosis not present

## 2017-07-16 DIAGNOSIS — M25561 Pain in right knee: Secondary | ICD-10-CM | POA: Diagnosis not present

## 2017-07-24 DIAGNOSIS — I2781 Cor pulmonale (chronic): Secondary | ICD-10-CM | POA: Diagnosis not present

## 2017-07-24 DIAGNOSIS — J449 Chronic obstructive pulmonary disease, unspecified: Secondary | ICD-10-CM | POA: Diagnosis not present

## 2017-07-24 DIAGNOSIS — M5432 Sciatica, left side: Secondary | ICD-10-CM | POA: Diagnosis not present

## 2017-07-24 DIAGNOSIS — M791 Myalgia: Secondary | ICD-10-CM | POA: Diagnosis not present

## 2017-07-24 DIAGNOSIS — J9612 Chronic respiratory failure with hypercapnia: Secondary | ICD-10-CM | POA: Diagnosis not present

## 2017-07-24 DIAGNOSIS — M25561 Pain in right knee: Secondary | ICD-10-CM | POA: Diagnosis not present

## 2017-08-21 ENCOUNTER — Ambulatory Visit (INDEPENDENT_AMBULATORY_CARE_PROVIDER_SITE_OTHER): Payer: Medicare Other | Admitting: Podiatry

## 2017-08-21 ENCOUNTER — Encounter: Payer: Self-pay | Admitting: Podiatry

## 2017-08-21 DIAGNOSIS — M79676 Pain in unspecified toe(s): Secondary | ICD-10-CM | POA: Diagnosis not present

## 2017-08-21 DIAGNOSIS — B351 Tinea unguium: Secondary | ICD-10-CM | POA: Diagnosis not present

## 2017-08-21 NOTE — Progress Notes (Signed)
Patient ID: [name], female   DOB: 1932/08/13, 83 y.o.   MRN: [id number] Complaint:  Visit Type: Patient returns to my office for continued preventative foot care services. Complaint: Patient states" my nails have grown long and thick and become painful to walk and wear shoes" . The patient presents for preventative foot care services. No changes to ROS.    Podiatric Exam: Vascular: dorsalis pedis and posterior tibial pulses are palpable bilateral. Capillary return is immediate. Temperature gradient is WNL. Skin turgor WNL  Sensorium: Diminished  Semmes Weinstein monofilament test. Normal tactile sensation bilaterally. Nail Exam: Pt has thick disfigured discolored nails with subungual debris noted bilateral entire nail hallux through fifth toenails Ulcer Exam: There is no evidence of ulcer or pre-ulcerative changes or infection. Orthopedic Exam: Muscle tone and strength are WNL. No limitations in general ROM. No crepitus or effusions noted. Foot type and digits show no abnormalities. Bony prominences are unremarkable. Skin: No Porokeratosis. No infection or ulcers.   Diagnosis:  Pain due to onychomycosis     Treatment & Plan Procedures and Treatment: Consent by patient was obtained for treatment procedures. The patient understood the discussion of treatment and procedures well. All questions were answered thoroughly reviewed. Debridement of mycotic and hypertrophic toenails, 1 through 5 bilateral and clearing of subungual debris. No ulceration, no infection noted.  Return Visit-Office Procedure: Patient instructed to return to the office for a follow up visit 3 months for continued evaluation and treatment.   Gardiner Barefoot DPM

## 2017-09-03 ENCOUNTER — Other Ambulatory Visit: Payer: Self-pay | Admitting: Internal Medicine

## 2017-09-04 ENCOUNTER — Other Ambulatory Visit: Payer: Self-pay | Admitting: Internal Medicine

## 2017-09-04 MED ORDER — PREDNISONE 10 MG PO TABS
ORAL_TABLET | ORAL | 0 refills | Status: DC
Start: 2017-09-04 — End: 2018-04-07

## 2017-09-30 ENCOUNTER — Other Ambulatory Visit (INDEPENDENT_AMBULATORY_CARE_PROVIDER_SITE_OTHER): Payer: Medicare Other

## 2017-09-30 DIAGNOSIS — Z23 Encounter for immunization: Secondary | ICD-10-CM | POA: Diagnosis not present

## 2017-10-02 ENCOUNTER — Other Ambulatory Visit: Payer: Self-pay | Admitting: Internal Medicine

## 2017-10-02 DIAGNOSIS — J441 Chronic obstructive pulmonary disease with (acute) exacerbation: Secondary | ICD-10-CM

## 2017-10-21 ENCOUNTER — Other Ambulatory Visit: Payer: Self-pay | Admitting: Internal Medicine

## 2017-11-05 DIAGNOSIS — Z961 Presence of intraocular lens: Secondary | ICD-10-CM | POA: Diagnosis not present

## 2017-11-05 DIAGNOSIS — H401112 Primary open-angle glaucoma, right eye, moderate stage: Secondary | ICD-10-CM | POA: Diagnosis not present

## 2017-11-12 ENCOUNTER — Ambulatory Visit (INDEPENDENT_AMBULATORY_CARE_PROVIDER_SITE_OTHER): Payer: Medicare Other | Admitting: Internal Medicine

## 2017-11-12 ENCOUNTER — Encounter: Payer: Self-pay | Admitting: Internal Medicine

## 2017-11-12 VITALS — BP 150/90 | HR 80 | Ht 67.0 in | Wt 134.0 lb

## 2017-11-12 DIAGNOSIS — J449 Chronic obstructive pulmonary disease, unspecified: Secondary | ICD-10-CM | POA: Diagnosis not present

## 2017-11-12 DIAGNOSIS — J9612 Chronic respiratory failure with hypercapnia: Secondary | ICD-10-CM

## 2017-11-12 MED ORDER — FLUTICASONE-UMECLIDIN-VILANT 100-62.5-25 MCG/INH IN AEPB: 1.0000 | INHALATION_SPRAY | Freq: Every day | RESPIRATORY_TRACT | 11 refills | Status: DC

## 2017-11-12 NOTE — Assessment & Plan Note (Signed)
-   ono RA 05/14/12   4 h 33 min < 89%   So ordered 02 2lpm    - HCO3  32 05/10/13    - 01/05/2013   Walked RA x one lap @ 185 stopped due to  88%   - 01/05/2013  Walked 2lpm 2 laps @ 185 ft each stopped due to  90% sob but improved vs baseline   - RA 05/01/2013 sats = 87%  - 05/10/2017 > 02 sats 88% RA > referred for POC eval > preferred continuous flow   Rx= 02 2lpm 24/7  As of  11/12/2017

## 2017-11-12 NOTE — Assessment & Plan Note (Signed)
-   PFTs 03/24/2012  FEV1  0.91 (43%) ratio 48 and no better p B2,  DLCO 38% corrects to 53%  - changed to perforomist/bud bid 04/2013  -2015 pulmonary rehab  - med calendar 07/17/13 > did not bring 06/28/2014 , .redone 07/26/2014  - 06/28/2014 p extensive coaching HFA effectiveness =    75% (short Ti) - 08/24/14 started IMPACT trial >finished 08/2015  - PFTs 12/13/14  FEV1 0.85 (38%) ratio 45  p 5 % improvement from saba  09/20/2015 Med calendar  - 05/08/2016 changed lama to incruse    - 11/12/2017  After extensive coaching HFA effectiveness =   75% (short Ti)    Severe but well compensated copd on a mix of nebs/dpi's and may do better on Trelegy if her insurance covers and if so change to albuterol dpi (proair respimat) for prn use   I had an extended discussion with the patient reviewing all relevant studies completed to date and  lasting 15 to 20 minutes of a 25 minute visit    Each maintenance medication was reviewed in detail including most importantly the difference between maintenance and prns and under what circumstances the prns are to be triggered using an action plan format that is not reflected in the computer generated alphabetically organized AVS but trather by a customized med calendar that reflects the AVS meds with confirmed 100% correlation.   In addition, Please see AVS for unique instructions that I personally wrote and verbalized to the the pt in detail and then reviewed with pt  by my nurse highlighting any  changes in therapy recommended at today's visit to their plan of care.

## 2017-11-12 NOTE — Patient Instructions (Addendum)
Plan A = Automatic = Trelegy one click eam am or  Incruse/performist/bud neb   Work on inhaler technique:  relax and gently blow all the way out then take a nice smooth deep breath back in, triggering the inhaler at same time you start breathing in.  Hold for up to 5 seconds if you can. Blow out thru nose. Rinse and gargle with water when done      Plan B = Backup Only use your albuterol as a rescue medication to be used if you can't catch your breath by resting or doing a relaxed purse lip breathing pattern.  - The less you use it, the better it will work when you need it. - Ok to use the inhaler up to 2 puffs  every 4 hours if you must but call for appointment if use goes up over your usual need - Don't leave home without it !!  (think of it like the spare tire for your car)   Plan C = Crisis - only use your albuterol nebulizer if you first try Plan B and it fails to help > ok to use the nebulizer up to every 4 hours but if start needing it regularly call for immediate appointment   See Tammy NP 3 months  with all your medications, even over the counter meds, separated in two separate bags, the ones you take no matter what vs the ones you stop once you feel better and take only as needed when you feel you need them.   Tammy  will generate for you a new user friendly medication calendar that will put [place] all on the same page re: your medication use.

## 2017-11-12 NOTE — Progress Notes (Signed)
Subjective:    Patient ID: [name]     DOB: [birth year]     MRN: [id number]  Brief patient profile:  61    yowf quit smoking 02/2013  referred 02/08/2012 by [name] for intermittent sob was on 02 but stopped in early 2013 and with GOLD III COPD documented 03/2012     History of Present Illness  11/09/2016  f/u ov/[name] re:   GOLD III / maint perforomist/ bud/Incruse/ prn saba neb  Chief Complaint  Patient presents with  . Follow-up    Pt called on 11/05/16 with c/o cough with yellow sputum- zpack and pred taper given. She is much improved and barely coughing at all at this point. Breathing is at her normal baseline.    baseline doe = MMRC3 = can't walk 100 yards even at a slow pace at a flat grade s stopping due to sob  Even on 02  rec For flares of cough/ wheeze/ short of breath assoc with change in mucus as you have in the past  >>> zpak / Prednisone 10 mg take  4 each am x 2 days,   2 each am x 2 days,  1 each am x 2 days and stop     11/12/2017  f/u ov/[name] re:  GOLD III/ maint  rx - took one cycle zpak /pred since last ov/ using med cal well / 2lpm 24/ 7 continuous, not pulsed Chief Complaint  Patient presents with  . Follow-up    2 liters O2, some sob with activity  MMRC3 = can't walk 100 yards even at a slow pace at a flat grade s stopping due to sob   No need for saba on perf/bud/incruse    No obvious day to day or daytime variability or assoc excess/ purulent sputum or mucus plugs or hemoptysis or cp or chest tightness, subjective wheeze or overt sinus or hb symptoms. No unusual exp hx or h/o childhood pna/ asthma or knowledge of premature birth.  Sleeping ok flat without nocturnal  or early am exacerbation  of respiratory  c/o's or need for noct saba. Also denies any obvious fluctuation of symptoms with weather or environmental changes or other aggravating or alleviating factors except as outlined above   Current Allergies, Complete Past Medical History, Past  Surgical History, Family History, and Social History were reviewed in [employer] record.  ROS  The following are not active complaints unless bolded Hoarseness, sore throat, dysphagia, dental problems, itching, sneezing,  nasal congestion or discharge of excess mucus or purulent secretions, ear ache,   fever, chills, sweats, unintended wt loss or wt gain, classically pleuritic or exertional cp,  orthopnea pnd or leg swelling, presyncope, palpitations, abdominal pain, anorexia, nausea, vomiting, diarrhea  or change in bowel habits or change in bladder habits, change in stools or change in urine, dysuria, hematuria,  rash, arthralgias, visual complaints, headache, numbness, weakness or ataxia or problems with walking or coordination,  change in mood/affect or memory.        Current Meds  Medication Sig  . acetaminophen (TYLENOL) 650 MG CR tablet Take 650 mg by mouth every 8 (eight) hours as needed for pain.  Marland Kitchen albuterol (PROVENTIL HFA;VENTOLIN HFA) 108 (90 Base) MCG/ACT inhaler Inhale 2 puffs into the lungs every 4 (four) hours as needed for wheezing or shortness of breath (((PLAN A))). Reported on 01/12/2016  . albuterol (PROVENTIL) (5 MG/ML) 0.5% nebulizer solution Take 2.5 mg by nebulization every 4 (four) hours as  needed for wheezing or shortness of breath ((( PLAN B))). Reported on 01/12/2016  . azithromycin (ZITHROMAX) 250 MG tablet TAKE 2 TABLETS BY MOUTH TODAY THEN TAKE 1 TABLET BY MOUTH DAILY UNTIL GONE  . brimonidine (ALPHAGAN) 0.15 % ophthalmic solution Place 1 drop into the right eye 3 (three) times daily.   . budesonide (PULMICORT) 0.25 MG/2ML nebulizer solution Take 2 mLs (0.25 mg total) by nebulization 2 (two) times daily.  . cetirizine (ZYRTEC) 10 MG tablet Take 10 mg by mouth at bedtime. Reported on 06/27/2016  . dextromethorphan-guaiFENesin (MUCINEX DM) 30-600 MG per 12 hr tablet Take 1-2 tablets by mouth every 12 (twelve) hours as needed (with flutter for cough,  congestion, and thick mucus). Reported on 06/27/2016  . famotidine (PEPCID) 20 MG tablet Take 20 mg by mouth 2 (two) times daily. Reported on 01/12/2016  . formoterol (PERFOROMIST) 20 MCG/2ML nebulizer solution Take 2 mLs (20 mcg total) by nebulization 2 (two) times daily.  . INCRUSE ELLIPTA 62.5 MCG/INH AEPB INHALE 1 PUFF BY MOUTH EVERY MORNING  . OXYGEN Use 2L of O2 continuously  . predniSONE (DELTASONE) 10 MG tablet Take 4 each am x 2 days, 2 each am x 2 days, 1 each am x 2 days and stop  . predniSONE (DELTASONE) 10 MG tablet 4 x 2 days, 2 x 2 days, 1 x 2 days  . Probiotic Product (PROBIOTIC DAILY PO) Take 1 tablet by mouth every morning. Reported on 06/27/2016                  Objective:   Physical Exam   Thin hoarse amb wf - - Note on arrival 02 sats  97% on 2lpm     Wt 97 02/08/2012 > 03/24/2012  99 > 103 05/09/2012 > 06/12/2012  99 >   09/24/2012  96 > 98 01/05/2013 >  106 05/01/2013 > 05/05/2013  99 > 05/15/2013  104 > 06/01/2013 115 > 115 07/07/2013 > 114  07/17/13 > 127 09/18/2013 > 12/01/2013  131 >135 03/04/2014 > 03/12/2014  133 > 05/27/2014  133  > 06/28/2014  133 >131 07/26/2014 >133 09/21/2014 139 >  04/14/2015  138  >  05/08/2016  146 > 05/10/2017    145 >  11/12/2017  134       LUNGS: no acc muscle use,  Barrel contour chest with very distant bs bilaterally and hyperresonant to percussion    HEENT: nl dentition, turbinates bilaterally, and oropharynx. Nl external ear canals without cough reflex   NECK :  without JVD/Nodes/TM/ nl carotid upstrokes bilaterally   LUNGS: no acc muscle use,  slt barrel chest with hyperresonant to percussion and distant bs w/o wheeze at all    CV:  RRR  no s3 or murmur or increase in P2, and no edema   ABD:  soft and nontender with  Pos mid insp hoover's in the supine position. No bruits or organomegaly appreciated, bowel sounds nl  MS:  Nl gait/ ext warm without deformities, calf tenderness, cyanosis or clubbing No obvious joint restrictions   SKIN: warm  and dry without lesions    NEURO:  alert, approp, nl sensorium with  no motor or cerebellar deficits apparent.       Assessment & Plan:

## 2017-11-20 ENCOUNTER — Encounter: Payer: Self-pay | Admitting: Podiatry

## 2017-11-20 ENCOUNTER — Ambulatory Visit (INDEPENDENT_AMBULATORY_CARE_PROVIDER_SITE_OTHER): Payer: Medicare Other | Admitting: Podiatry

## 2017-11-20 DIAGNOSIS — M79676 Pain in unspecified toe(s): Secondary | ICD-10-CM

## 2017-11-20 DIAGNOSIS — B351 Tinea unguium: Secondary | ICD-10-CM | POA: Diagnosis not present

## 2017-11-20 NOTE — Progress Notes (Signed)
Patient ID: [name], female   DOB: 1932/02/17, 83 y.o.   MRN: [id number] Complaint:  Visit Type: Patient returns to my office for continued preventative foot care services. Complaint: Patient states" my nails have grown long and thick and become painful to walk and wear shoes" . The patient presents for preventative foot care services. No changes to ROS.    Podiatric Exam: Vascular: dorsalis pedis and posterior tibial pulses are palpable bilateral. Capillary return is immediate. Temperature gradient is WNL. Skin turgor WNL  Sensorium: Diminished  Semmes Weinstein monofilament test. Normal tactile sensation bilaterally. Nail Exam: Pt has thick disfigured discolored nails with subungual debris noted bilateral entire nail hallux through fifth toenails Ulcer Exam: There is no evidence of ulcer or pre-ulcerative changes or infection. Orthopedic Exam: Muscle tone and strength are WNL. No limitations in general ROM. No crepitus or effusions noted. Foot type and digits show no abnormalities. Bony prominences are unremarkable. Skin: No Porokeratosis. No infection or ulcers.   Diagnosis:  Pain due to onychomycosis     Treatment & Plan Procedures and Treatment: Consent by patient was obtained for treatment procedures. The patient understood the discussion of treatment and procedures well. All questions were answered thoroughly reviewed. Debridement of mycotic and hypertrophic toenails, 1 through 5 bilateral and clearing of subungual debris. No ulceration, no infection noted.  Return Visit-Office Procedure: Patient instructed to return to the office for a follow up visit 3 months for continued evaluation and treatment.   Gardiner Barefoot DPM

## 2017-11-23 ENCOUNTER — Other Ambulatory Visit: Payer: Self-pay | Admitting: Internal Medicine

## 2017-11-25 ENCOUNTER — Telehealth: Payer: Self-pay | Admitting: Internal Medicine

## 2017-11-25 MED ORDER — AZITHROMYCIN 250 MG PO TABS: ORAL_TABLET | ORAL | 0 refills | Status: AC

## 2017-11-25 NOTE — Telephone Encounter (Signed)
Pt is aware of MW's recommendations and voiced her understanding.  Rx has been sent to preferred pharmacy. Nothing further needed.  

## 2017-11-25 NOTE — Telephone Encounter (Signed)
z-pak 

## 2017-11-25 NOTE — Telephone Encounter (Signed)
Spoke with pt.  She c/o prod cough for 4 days (yellow).  Denies sinus issues, wheezing or fever.  SOB the same.  PT requesting abx.  Please advise

## 2017-11-27 ENCOUNTER — Other Ambulatory Visit: Payer: Self-pay | Admitting: Internal Medicine

## 2018-02-12 ENCOUNTER — Encounter: Payer: Medicare Other | Admitting: Adult Health

## 2018-02-19 ENCOUNTER — Ambulatory Visit (INDEPENDENT_AMBULATORY_CARE_PROVIDER_SITE_OTHER): Payer: Medicare Other | Admitting: Podiatry

## 2018-02-19 ENCOUNTER — Encounter: Payer: Self-pay | Admitting: Podiatry

## 2018-02-19 DIAGNOSIS — M79676 Pain in unspecified toe(s): Secondary | ICD-10-CM

## 2018-02-19 DIAGNOSIS — B351 Tinea unguium: Secondary | ICD-10-CM | POA: Diagnosis not present

## 2018-02-19 NOTE — Progress Notes (Addendum)
Patient ID: [name], female   DOB: 11/01/1932, 85 y.o.   MRN: [id number] Complaint:  Visit Type: Patient returns to my office for continued preventative foot care services. Complaint: Patient states" my nails have grown long and thick and become painful to walk and wear shoes" . The patient presents for preventative foot care services. No changes to ROS.    Podiatric Exam: Vascular: dorsalis pedis and posterior tibial pulses are palpable bilateral. Capillary return is immediate. Temperature gradient is WNL. Skin turgor WNL  Sensorium: Diminished  Semmes Weinstein monofilament test. Normal tactile sensation bilaterally. Nail Exam: Pt has thick disfigured discolored nails with subungual debris noted bilateral entire nail hallux through fifth toenails Ulcer Exam: There is no evidence of ulcer or pre-ulcerative changes or infection. Orthopedic Exam: Muscle tone and strength are WNL. No limitations in general ROM. No crepitus or effusions noted. Foot type and digits show no abnormalities. Bony prominences are unremarkable. Skin: No Porokeratosis. No infection or ulcers.   Diagnosis:  Pain due to onychomycosis     Treatment & Plan Procedures and Treatment: Consent by patient was obtained for treatment procedures. The patient understood the discussion of treatment and procedures well. All questions were answered thoroughly reviewed. Debridement of mycotic and hypertrophic toenails, 1 through 5 bilateral and clearing of subungual debris. No ulceration, no infection noted. ABN signed for 2019.  Return Visit-Office Procedure: Patient instructed to return to the office for a follow up visit 3 months for continued evaluation and treatment.   Shalie Schremp DPM 

## 2018-04-07 ENCOUNTER — Ambulatory Visit (INDEPENDENT_AMBULATORY_CARE_PROVIDER_SITE_OTHER): Payer: Medicare Other | Admitting: Family Medicine

## 2018-04-07 ENCOUNTER — Encounter: Payer: Self-pay | Admitting: Family Medicine

## 2018-04-07 ENCOUNTER — Telehealth: Payer: Self-pay

## 2018-04-07 VITALS — BP 138/86 | HR 87 | Temp 97.5°F | Ht 66.0 in | Wt 144.6 lb

## 2018-04-07 DIAGNOSIS — H9113 Presbycusis, bilateral: Secondary | ICD-10-CM | POA: Diagnosis not present

## 2018-04-07 DIAGNOSIS — H6123 Impacted cerumen, bilateral: Secondary | ICD-10-CM | POA: Diagnosis not present

## 2018-04-07 NOTE — Progress Notes (Signed)
   Subjective:    Patient ID: [name], female    DOB: 26-Jul-1932, 84 y.o.   MRN: [id number]  HPI She is here for consult concerning possible earwax.  She is getting ready to get new hearing aids.   Review of Systems     Objective:   Physical Exam Alert and in no distress.  Small amount of cerumen is present in both canals.  Canals and TMs are normal       Assessment & Plan:  Presbycusis of both ears  Bilateral impacted cerumen Lavage was successful.

## 2018-04-09 NOTE — Telephone Encounter (Signed)
error 

## 2018-04-14 ENCOUNTER — Telehealth: Payer: Self-pay | Admitting: Internal Medicine

## 2018-04-14 NOTE — Telephone Encounter (Signed)
Spoke with pt. States that she forgot to take her Trelegy yesterday morning. She took this around 9pm last night. Pt wanted to know when she could use it again. Advised her that it should be 24 hours a part. Nothing further was needed.

## 2018-05-02 ENCOUNTER — Encounter: Payer: Self-pay | Admitting: Adult Health

## 2018-05-02 ENCOUNTER — Ambulatory Visit (INDEPENDENT_AMBULATORY_CARE_PROVIDER_SITE_OTHER): Payer: Medicare Other | Admitting: Adult Health

## 2018-05-02 DIAGNOSIS — J449 Chronic obstructive pulmonary disease, unspecified: Secondary | ICD-10-CM

## 2018-05-02 DIAGNOSIS — J9612 Chronic respiratory failure with hypercapnia: Secondary | ICD-10-CM | POA: Diagnosis not present

## 2018-05-02 DIAGNOSIS — Z7189 Other specified counseling: Secondary | ICD-10-CM | POA: Diagnosis not present

## 2018-05-02 NOTE — Assessment & Plan Note (Signed)
Continue on oxygen 

## 2018-05-02 NOTE — Progress Notes (Signed)
@Patient  ID: [name], female    DOB: 03/28/1932, 84 y.o.   MRN: [id number]  Chief Complaint  Patient presents with  . Follow-up    Referring provider: Denita Lung, MD  HPI: 84 yo female former smoker, quit 2014, followed for GOLD III COPD , O2 RF   05/02/2018 Follow up ; COPD , O2 RF  Returns for 6 month follow up .  Since his last visit her breathing has been doing well.  She denies any flare of cough or shortness of breath.  Patient was changed over to [facility] last visit.  She says she really likes this inhaler.  Seems to work well for her.  She denies chest pain orthopnea PND or increased leg swelling. Remains on oxygen 2 L. Prevnar and Pneumovax are up-to-date  We reviewed all her medications and organize them into a medication encounter with patient education appears to be taking her medications correctly  Allergies  Allergen Reactions  . Augmentin [Amoxicillin-Pot Clavulanate] Nausea And Vomiting  . Levofloxacin Other (See Comments)    Lowered blood pressre    Immunization History  Administered Date(s) Administered  . DTaP 12/06/1992, 04/27/2004  . Influenza Split 10/07/2012, 09/11/2013  . Influenza Whole 10/08/2001, 10/02/2002, 09/19/2011, 09/23/2016  . Influenza, High Dose Seasonal PF 09/22/2014, 09/30/2017  . Influenza,inj,Quad PF,6+ Mos 09/20/2015  . Pneumococcal Conjugate-13 05/27/2014  . Pneumococcal Polysaccharide-23 04/07/2013  . Zoster 12/03/2008    Past Medical History:  Diagnosis Date  . Asthma   . COPD (chronic obstructive pulmonary disease) ([place]) 01/2010   Dr. Melvyn Novas;  . Cor pulmonale [facility])   . Former smoker    quit 02/2013  . H/O bone density study 2008   osteoporosis, improved density on Fosamax at that time  . Hypoxia 01/2013   hospitalization, Bipap therapy, COPD exacerbation  . Osteoporosis     Tobacco History: Social History   Tobacco Use  Smoking Status Former Smoker  . Packs/day: 0.30  . Years: 50.00  . Pack years:  15.00  . Types: Cigarettes  . Last attempt to quit: 03/05/2013  . Years since quitting: 5.1  Smokeless Tobacco Never Used   Counseling given: Not Answered   Outpatient Encounter Medications as of 05/02/2018  Medication Sig  . acetaminophen (TYLENOL) 650 MG CR tablet Take 650 mg by mouth every 8 (eight) hours as needed for pain.  Marland Kitchen albuterol (PROVENTIL HFA;VENTOLIN HFA) 108 (90 Base) MCG/ACT inhaler Inhale 2 puffs into the lungs every 4 (four) hours as needed for wheezing or shortness of breath (((PLAN A))). Reported on 01/12/2016  . albuterol (PROVENTIL) (5 MG/ML) 0.5% nebulizer solution Take 2.5 mg by nebulization every 4 (four) hours as needed for wheezing or shortness of breath ((( PLAN B))). Reported on 01/12/2016  . azithromycin (ZITHROMAX) 250 MG tablet TAKE 2 TABLETS BY MOUTH TODAY THEN TAKE 1 TABLET BY MOUTH DAILY UNTIL GONE (Patient not taking: Reported on 04/07/2018)  . azithromycin (ZITHROMAX) 250 MG tablet TAKE 2 TABLETS BY MOUTH TODAY THEN TAKE 1 TABLET BY MOUTH DAILY UNTIL GONE (Patient not taking: Reported on 04/07/2018)  . brimonidine (ALPHAGAN) 0.15 % ophthalmic solution Place 1 drop into the right eye 3 (three) times daily.   . budesonide (PULMICORT) 0.25 MG/2ML nebulizer solution Take 2 mLs (0.25 mg total) by nebulization 2 (two) times daily. (Patient not taking: Reported on 04/07/2018)  . cetirizine (ZYRTEC) 10 MG tablet Take 10 mg by mouth at bedtime. Reported on 06/27/2016  . dextromethorphan-guaiFENesin ([place] DM) 30-600 MG per  12 hr tablet Take 1-2 tablets by mouth every 12 (twelve) hours as needed (with flutter for cough, congestion, and thick mucus). Reported on 06/27/2016  . famotidine (PEPCID) 20 MG tablet Take 20 mg by mouth 2 (two) times daily. Reported on 01/12/2016  . Fluticasone-Umeclidin-Vilant (TRELEGY ELLIPTA) 100-62.5-25 MCG/INH AEPB Inhale 1 Inhaler into the lungs daily.  . formoterol (PERFOROMIST) 20 MCG/2ML nebulizer solution Take 2 mLs (20 mcg total) by  nebulization 2 (two) times daily. (Patient not taking: Reported on 04/07/2018)  . INCRUSE ELLIPTA 62.5 MCG/INH AEPB INHALE 1 PUFF BY MOUTH EVERY MORNING (Patient not taking: Reported on 04/07/2018)  . OXYGEN Use 2L of O2 continuously  . predniSONE (DELTASONE) 10 MG tablet Take 4 each am x 2 days, 2 each am x 2 days, 1 each am x 2 days and stop  . Probiotic Product (PROBIOTIC DAILY PO) Take 1 tablet by mouth every morning. Reported on 06/27/2016   Facility-Administered Encounter Medications as of 05/02/2018  Medication  . influenza  inactive virus vaccine (FLUZONE/FLUARIX) injection 0.5 mL     Review of Systems  Constitutional:   No  weight loss, night sweats,  Fevers, chills, fatigue, or  lassitude.  HEENT:   No headaches,  Difficulty swallowing,  Tooth/dental problems, or  Sore throat,                No sneezing, itching, ear ache, nasal congestion, post nasal drip,   CV:  No chest pain,  Orthopnea, PND, swelling in lower extremities, anasarca, dizziness, palpitations, syncope.   GI  No heartburn, indigestion, abdominal pain, nausea, vomiting, diarrhea, change in bowel habits, loss of appetite, bloody stools.   Resp: No shortness of breath with exertion or at rest.  No excess mucus, no productive cough,  No non-productive cough,  No coughing up of blood.  No change in color of mucus.  No wheezing.  No chest wall deformity  Skin: no rash or lesions.  GU: no dysuria, change in color of urine, no urgency or frequency.  No flank pain, no hematuria   MS:  No joint pain or swelling.  No decreased range of motion.  No back pain.    Physical Exam  BP 116/74   Pulse 71   Ht 5\' 7"  (1.702 m)   Wt 141 lb 6.4 oz (64.1 kg)   SpO2 98%   BMI 22.15 kg/m   GEN: A/Ox3; pleasant , NAD, really on oxygen   HEENT:  [place]/AT,  EACs-clear, TMs-wnl, NOSE-clear, THROAT-clear, no lesions, no postnasal drip or exudate noted.   NECK:  Supple w/ fair ROM; no JVD; normal carotid impulses w/o bruits; no  thyromegaly or nodules palpated; no lymphadenopathy.    RESP decreased breath sounds in the bases no accessory muscle use, no dullness to percussion  CARD:  RRR, no m/r/g, no peripheral edema, pulses intact, no cyanosis or clubbing.  GI:   Soft & nt; nml bowel sounds; no organomegaly or masses detected.   Musco: Warm bil, no deformities or joint swelling noted.   Neuro: alert, no focal deficits noted.    Skin: Warm, no lesions or rashes     BMET    ProBNP  Imaging: No results found.   Assessment & Plan:   No problem-specific Assessment & Plan notes found for this encounter.     Rexene Edison, NP 05/02/2018

## 2018-05-02 NOTE — Patient Instructions (Signed)
Continue on current regimen .  Continue on Oxygen 2l/m .  Follow medication calendar closely and bring to each visit .  follow up Dr. Melvyn Novas  In 6 months and As needed

## 2018-05-02 NOTE — Assessment & Plan Note (Signed)
Patient's medications were reviewed today and patient education was given. Computerized medication calendar was adjusted/completed

## 2018-05-02 NOTE — Assessment & Plan Note (Signed)
Controlled on current regimen  Patient's medications were reviewed today and patient education was given. Computerized medication calendar was adjusted/completed   Plan  Patient Instructions  Continue on current regimen .  Continue on Oxygen 2l/m .  Follow medication calendar closely and bring to each visit .  follow up Dr. Melvyn Novas  In 6 months and As needed

## 2018-05-06 NOTE — Addendum Note (Signed)
Addended by: Della Goo C on: 05/06/2018 04:24 PM   Modules accepted: Orders

## 2018-05-07 DIAGNOSIS — H524 Presbyopia: Secondary | ICD-10-CM | POA: Diagnosis not present

## 2018-05-07 DIAGNOSIS — H401112 Primary open-angle glaucoma, right eye, moderate stage: Secondary | ICD-10-CM | POA: Diagnosis not present

## 2018-05-07 DIAGNOSIS — Z961 Presence of intraocular lens: Secondary | ICD-10-CM | POA: Diagnosis not present

## 2018-05-21 ENCOUNTER — Encounter: Payer: Self-pay | Admitting: Podiatry

## 2018-05-21 ENCOUNTER — Ambulatory Visit (INDEPENDENT_AMBULATORY_CARE_PROVIDER_SITE_OTHER): Payer: Medicare Other | Admitting: Podiatry

## 2018-05-21 DIAGNOSIS — B351 Tinea unguium: Secondary | ICD-10-CM | POA: Diagnosis not present

## 2018-05-21 DIAGNOSIS — M79676 Pain in unspecified toe(s): Secondary | ICD-10-CM | POA: Diagnosis not present

## 2018-05-21 NOTE — Progress Notes (Signed)
Patient ID: [name], female   DOB: 11/18/1932, 85 y.o.   MRN: [id number] Complaint:  Visit Type: Patient returns to my office for continued preventative foot care services. Complaint: Patient states" my nails have grown long and thick and become painful to walk and wear shoes" . The patient presents for preventative foot care services. No changes to ROS.    Podiatric Exam: Vascular: dorsalis pedis and posterior tibial pulses are palpable bilateral. Capillary return is immediate. Temperature gradient is WNL. Skin turgor WNL  Sensorium: Diminished  Semmes Weinstein monofilament test. Normal tactile sensation bilaterally. Nail Exam: Pt has thick disfigured discolored nails with subungual debris noted bilateral entire nail hallux through fifth toenails Ulcer Exam: There is no evidence of ulcer or pre-ulcerative changes or infection. Orthopedic Exam: Muscle tone and strength are WNL. No limitations in general ROM. No crepitus or effusions noted. Foot type and digits show no abnormalities. Bony prominences are unremarkable. Skin: No Porokeratosis. No infection or ulcers.   Diagnosis:  Pain due to onychomycosis     Treatment & Plan Procedures and Treatment: Consent by patient was obtained for treatment procedures. The patient understood the discussion of treatment and procedures well. All questions were answered thoroughly reviewed. Debridement of mycotic and hypertrophic toenails, 1 through 5 bilateral and clearing of subungual debris. No ulceration, no infection noted. ABN signed for 2019.  Return Visit-Office Procedure: Patient instructed to return to the office for a follow up visit 3 months for continued evaluation and treatment.   Kassadie Pancake DPM 

## 2018-06-01 ENCOUNTER — Other Ambulatory Visit: Payer: Self-pay | Admitting: Internal Medicine

## 2018-06-04 ENCOUNTER — Telehealth: Payer: Self-pay | Admitting: Internal Medicine

## 2018-06-04 MED ORDER — DOXYCYCLINE HYCLATE 100 MG PO TABS: 100.0000 mg | ORAL_TABLET | Freq: Two times a day (BID) | ORAL | 0 refills | Status: DC

## 2018-06-04 MED ORDER — PREDNISONE 10 MG PO TABS: ORAL_TABLET | ORAL | 0 refills | Status: DC

## 2018-06-04 NOTE — Telephone Encounter (Signed)
Spoke with pt's sister, Ulis Rias and notified of recs per MR  She verbalized understanding and will inform the pt  Rxs were sent to [facility]

## 2018-06-04 NOTE — Telephone Encounter (Signed)
Called and spoke with patient, she states that she has been sick since Monday. She has had a productive cough with thick mucus production, no appetite, SOB, small fever, and congestion. Patient denies chest pain and body aches. Patient has been taking mucinex and it is not working. Due to MW being out of the office MR please advise on this, thank you.

## 2018-06-04 NOTE — Telephone Encounter (Signed)
  AECOPD  Take doxycycline 100mg  po twice daily x 5 days; take after meals and avoid sunlight  Please take prednisone 40 mg x1 day, then 30 mg x1 day, then 20 mg x1 day, then 10 mg x1 day, and then 5 mg x1 day and stop   Allergies  Allergen Reactions  . Augmentin [Amoxicillin-Pot Clavulanate] Nausea And Vomiting  . Levofloxacin Other (See Comments)    Lowered blood pressre

## 2018-07-24 ENCOUNTER — Other Ambulatory Visit: Payer: Self-pay

## 2018-07-24 ENCOUNTER — Telehealth: Payer: Self-pay | Admitting: Internal Medicine

## 2018-07-24 NOTE — Telephone Encounter (Signed)
Attempted to call pt. I did not receive an answer. I have left a message for pt to return our call.  

## 2018-07-25 NOTE — Telephone Encounter (Signed)
Called and spoke with pt regarding POC Inogen concerns Pt is wanting to know if Medicare will cover Inogen 100% and if [facility] can provide the Inogen Explained to pt how Inogen works, and the process Pt advised she will call Inogen and [facility] today for further information Nothing further needed at this time.

## 2018-08-07 ENCOUNTER — Telehealth: Payer: Self-pay | Admitting: Internal Medicine

## 2018-08-07 NOTE — Telephone Encounter (Signed)
Spoke with the pt  She states she took her trelegy at 3 pm today, when she normally takes it at 11 am  I advised this should be taken when she first wakes up in the morning She verbalized understanding  Nothing further needed

## 2018-08-20 ENCOUNTER — Ambulatory Visit (INDEPENDENT_AMBULATORY_CARE_PROVIDER_SITE_OTHER): Payer: Medicare Other | Admitting: Podiatry

## 2018-08-20 ENCOUNTER — Encounter: Payer: Self-pay | Admitting: Podiatry

## 2018-08-20 DIAGNOSIS — B351 Tinea unguium: Secondary | ICD-10-CM | POA: Diagnosis not present

## 2018-08-20 DIAGNOSIS — M79676 Pain in unspecified toe(s): Secondary | ICD-10-CM | POA: Diagnosis not present

## 2018-08-20 NOTE — Progress Notes (Signed)
Patient ID: [name], female   DOB: 1932/04/29, 84 y.o.   MRN: [id number] Complaint:  Visit Type: Patient returns to my office for continued preventative foot care services. Complaint: Patient states" my nails have grown long and thick and become painful to walk and wear shoes" . The patient presents for preventative foot care services. No changes to ROS.    Podiatric Exam: Vascular: dorsalis pedis and posterior tibial pulses are palpable bilateral. Capillary return is immediate. Temperature gradient is WNL. Skin turgor WNL  Sensorium: Diminished  Semmes Weinstein monofilament test. Normal tactile sensation bilaterally. Nail Exam: Pt has thick disfigured discolored nails with subungual debris noted bilateral entire nail hallux through fifth toenails Ulcer Exam: There is no evidence of ulcer or pre-ulcerative changes or infection. Orthopedic Exam: Muscle tone and strength are WNL. No limitations in general ROM. No crepitus or effusions noted. Foot type and digits show no abnormalities. Bony prominences are unremarkable. Skin: No Porokeratosis. No infection or ulcers.   Diagnosis:  Pain due to onychomycosis     Treatment & Plan Procedures and Treatment: Consent by patient was obtained for treatment procedures. The patient understood the discussion of treatment and procedures well. All questions were answered thoroughly reviewed. Debridement of mycotic and hypertrophic toenails, 1 through 5 bilateral and clearing of subungual debris. No ulceration, no infection noted. ABN signed for 2019.  Return Visit-Office Procedure: Patient instructed to return to the office for a follow up visit 3 months for continued evaluation and treatment.   Gardiner Barefoot DPM

## 2018-09-17 ENCOUNTER — Other Ambulatory Visit (INDEPENDENT_AMBULATORY_CARE_PROVIDER_SITE_OTHER): Payer: Medicare Other

## 2018-09-17 DIAGNOSIS — Z23 Encounter for immunization: Secondary | ICD-10-CM | POA: Diagnosis not present

## 2018-10-22 ENCOUNTER — Telehealth: Payer: Self-pay

## 2018-10-22 NOTE — Telephone Encounter (Signed)
Called pt to close out fall and depression screening. LVm for pt to call office back. [place]

## 2018-11-03 ENCOUNTER — Ambulatory Visit (INDEPENDENT_AMBULATORY_CARE_PROVIDER_SITE_OTHER): Payer: Medicare Other | Admitting: Internal Medicine

## 2018-11-03 ENCOUNTER — Encounter: Payer: Self-pay | Admitting: Internal Medicine

## 2018-11-03 VITALS — BP 142/70 | HR 96 | Ht 67.0 in | Wt 144.0 lb

## 2018-11-03 DIAGNOSIS — J9612 Chronic respiratory failure with hypercapnia: Secondary | ICD-10-CM | POA: Diagnosis not present

## 2018-11-03 DIAGNOSIS — J449 Chronic obstructive pulmonary disease, unspecified: Secondary | ICD-10-CM | POA: Diagnosis not present

## 2018-11-03 NOTE — Patient Instructions (Signed)
No change in medications  See calendar for specific medication instructions and bring it back for each and every office visit for every healthcare provider you see.  Without it,  you may not receive the best quality medical care that we feel you deserve.  You will note that the calendar groups together  your maintenance  medications that are timed at particular times of the day.  Think of this as your checklist for what your doctor has instructed you to do until your next evaluation to see what benefit  there is  to staying on a consistent group of medications intended to keep you well.  The other group at the bottom is entirely up to you to use as you see fit  for specific symptoms that may arise between visits that require you to treat them on an as needed basis.  Think of this as your action plan or "what if" list.   Separating the top medications from the bottom group is fundamental to providing you adequate care going forward.     Please schedule a follow up visit in 6  months but call sooner if needed  

## 2018-11-03 NOTE — Progress Notes (Signed)
Subjective:    Patient ID: [name]     DOB: June 16, 1932     MRN: [id number]  Brief patient profile:  8  yowf quit smoking 02/2013  referred 02/08/2012 by Glade Lloyd for intermittent sob was on 02 but stopped in early 2013 and with GOLD III COPD documented 03/2012     History of Present Illness  11/09/2016  f/u ov/Ryzen Deady re:   GOLD III / maint perforomist/ bud/Incruse/ prn saba neb  Chief Complaint  Patient presents with  . Follow-up    Pt called on 11/05/16 with c/o cough with yellow sputum- zpack and pred taper given. She is much improved and barely coughing at all at this point. Breathing is at her normal baseline.    baseline doe = MMRC3 = can't walk 100 yards even at a slow pace at a flat grade s stopping due to sob  Even on 02  rec For flares of cough/ wheeze/ short of breath assoc with change in mucus as you have in the past  >>> zpak / Prednisone 10 mg take  4 each am x 2 days,   2 each am x 2 days,  1 each am x 2 days and stop     11/12/2017  f/u ov/Yeny Schmoll re:  GOLD III/ maint  rx - took one cycle zpak /pred since last ov/ using med cal well / 2lpm 24/ 7 continuous, not pulsed Chief Complaint  Patient presents with  . Follow-up    2 liters O2, some sob with activity  MMRC3 = can't walk 100 yards even at a slow pace at a flat grade s stopping due to sob   No need for saba on perf/bud/incruse  rec trelegy a    11/03/2018  f/u ov/Quaneshia Wareing re: GOLD III copd / 02 dep / using med calendar well  Chief Complaint  Patient presents with  . Follow-up    Breathing is overall doing well. She has occ cough- prod with yellow to clear sputum. She rarely uses her albuterol.    Dyspnea:  MMRC3 = can't walk 100 yards even at a slow pace at a flat grade s stopping due to sob  On 2lpm Cough: minimum, not exac in am's Sleeping: 2 pillows/ flat bed  SABA use: none 02: 2lpm 24/7   No obvious day to day or daytime variability or assoc  r mucus plugs or hemoptysis or cp or chest tightness,  subjective wheeze or overt sinus or hb symptoms.   Sleeping as above  without nocturnal  or early am exacerbation  of respiratory  c/o's or need for noct saba. Also denies any obvious fluctuation of symptoms with weather or environmental changes or other aggravating or alleviating factors except as outlined above   No unusual exposure hx or h/o childhood pna/ asthma or knowledge of premature birth.  Current Allergies, Complete Past Medical History, Past Surgical History, Family History, and Social History were reviewed in [employer] [facility] record.  ROS  The following are not active complaints unless bolded Hoarseness, sore throat, dysphagia, dental problems, itching, sneezing,  nasal congestion or discharge of excess mucus or purulent secretions, ear ache,   fever, chills, sweats, unintended wt loss or wt gain, classically pleuritic or exertional cp,  orthopnea pnd or arm/hand swelling  or leg swelling, presyncope, palpitations, abdominal pain, anorexia, nausea, vomiting, diarrhea  or change in bowel habits or change in bladder habits, change in stools or change in urine, dysuria, hematuria,  rash, arthralgias, visual  complaints, headache, numbness, weakness or ataxia or problems with walking or coordination,  change in mood or  memory.        Current Meds  Medication Sig  . acetaminophen (TYLENOL) 650 MG CR tablet Take 650 mg by mouth every 8 (eight) hours as needed for pain.  Marland Kitchen albuterol (PROVENTIL HFA;VENTOLIN HFA) 108 (90 Base) MCG/ACT inhaler Inhale 2 puffs into the lungs every 4 (four) hours as needed for wheezing or shortness of breath (((PLAN A))). Reported on 01/12/2016  . brimonidine (ALPHAGAN) 0.2 % ophthalmic solution   . Cholecalciferol (VITAMIN D) 50 MCG (2000 UT) CAPS Take 1 capsule by mouth daily.  Marland Kitchen dextromethorphan-guaiFENesin (MUCINEX DM) 30-600 MG per 12 hr tablet Take 1-2 tablets by mouth every 12 (twelve) hours as needed (with flutter for cough, congestion, and  thick mucus). Reported on 06/27/2016  . Fluticasone-Umeclidin-Vilant (TRELEGY ELLIPTA) 100-62.5-25 MCG/INH AEPB Inhale 1 Inhaler into the lungs daily.  . OXYGEN Use 2L of O2 continuously                     Objective:   Physical Exam   thin amb wf nad     Wt 97 02/08/2012 > 03/24/2012  99 > 103 05/09/2012 > 06/12/2012  99 >   09/24/2012  96 > 98 01/05/2013 >  106 05/01/2013 > 05/05/2013  99 > 05/15/2013  104 > 06/01/2013 115 > 115 07/07/2013 > 114  07/17/13 > 127 09/18/2013 > 12/01/2013  131 >135 03/04/2014 > 03/12/2014  133 > 05/27/2014  133  > 06/28/2014  133 >131 07/26/2014 >133 09/21/2014 139 >  04/14/2015  138  >  05/08/2016  146 > 05/10/2017    145 >  11/12/2017  134 >  11/03/2018   144    Vital signs reviewed - Note on arrival 02 sats  96% on 2lpm continuous       HEENT: nl dentition / oropharynx. Nl external ear canals without cough reflex -  Mild bilateral non-specific turbinate edema     NECK :  without JVD/Nodes/TM/ nl carotid upstrokes bilaterally   LUNGS: no acc muscle use,  Mod barrel  contour chest wall with bilateral  Distant bs s audible wheeze and  without cough on insp or exp maneuver and mod  Hyperresonant  to  percussion bilaterally     CV:  RRR  no s3 or murmur or increase in P2, and no edema   ABD:  soft and nontender with pos mid insp Hoover's  in the supine position. No bruits or organomegaly appreciated, bowel sounds nl  MS:   Nl gait/  ext warm without deformities, calf tenderness, cyanosis or clubbing No obvious joint restrictions   SKIN: warm and dry without lesions    NEURO:  alert, approp, nl sensorium with  no motor or cerebellar deficits apparent.           Assessment & Plan:

## 2018-11-05 ENCOUNTER — Encounter: Payer: Self-pay | Admitting: Internal Medicine

## 2018-11-05 NOTE — Assessment & Plan Note (Signed)
-   ono RA 05/14/12   4 h 33 min < 89%   So ordered 02 2lpm    - HCO3  32 05/10/13    - 01/05/2013   Walked RA x one lap @ 185 stopped due to  88%   - 01/05/2013  Walked 2lpm 2 laps @ 185 ft each stopped due to  90% sob but improved vs baseline   - RA 05/01/2013 sats = 87%  - 05/10/2017 > 02 sats 88% RA > referred for POC eval > preferred continuous flow   Rx= 02 2lpm 24/7  As of  11/03/2018      Adequate control on present rx, reviewed in detail with pt > no change in rx needed       Each maintenance medication was reviewed in detail including most importantly the difference between maintenance and as needed and under what circumstances the prns are to be used. This was done in the context of a medication calendar review which provided the patient with a user-friendly unambiguous mechanism for medication administration and reconciliation and provides an action plan for all active problems. It is critical that this be shown to every doctor  for modification during the office visit if necessary so the patient can use it as a working document.

## 2018-11-05 NOTE — Assessment & Plan Note (Addendum)
-   PFTs 03/24/2012  FEV1  0.91 (43%) ratio 48 and no better p B2,  DLCO 38% corrects to 53%  - changed to perforomist/bud bid 04/2013  -2015 pulmonary rehab  - med calendar 07/17/13 > did not bring 06/28/2014 , .redone 07/26/2014  - 06/28/2014 p extensive coaching HFA effectiveness =    75% (short Ti) - 08/24/14 started IMPACT trial >finished 08/2015  - PFTs 12/13/14  FEV1 0.85 (38%) ratio 45  p 5 % improvement from saba  09/20/2015 Med calendar  - 05/08/2016 changed lama to incruse  - 11/12/2017  After extensive coaching HFA effectiveness =   75% (short Ti) - 11/12/17 > changed to trelegy    Adequate control on present rx, reviewed in detail with pt > no change in rx needed

## 2018-11-13 ENCOUNTER — Other Ambulatory Visit: Payer: Self-pay

## 2018-11-19 ENCOUNTER — Encounter: Payer: Self-pay | Admitting: Podiatry

## 2018-11-19 ENCOUNTER — Ambulatory Visit (INDEPENDENT_AMBULATORY_CARE_PROVIDER_SITE_OTHER): Payer: Medicare Other | Admitting: Podiatry

## 2018-11-19 DIAGNOSIS — B351 Tinea unguium: Secondary | ICD-10-CM

## 2018-11-19 DIAGNOSIS — M79676 Pain in unspecified toe(s): Secondary | ICD-10-CM | POA: Diagnosis not present

## 2018-11-19 NOTE — Progress Notes (Signed)
Patient ID: [name], female   DOB: Jan 27, 1932, 84 y.o.   MRN: [id number] Complaint:  Visit Type: Patient returns to my office for continued preventative foot care services. Complaint: Patient states" my nails have grown long and thick and become painful to walk and wear shoes" . The patient presents for preventative foot care services. No changes to ROS.    Podiatric Exam: Vascular: dorsalis pedis and posterior tibial pulses are palpable bilateral. Capillary return is immediate. Temperature gradient is WNL. Skin turgor WNL  Sensorium: Diminished  Semmes Weinstein monofilament test. Normal tactile sensation bilaterally. Nail Exam: Pt has thick disfigured discolored nails with subungual debris noted bilateral entire nail hallux through fifth toenails Ulcer Exam: There is no evidence of ulcer or pre-ulcerative changes or infection. Orthopedic Exam: Muscle tone and strength are WNL. No limitations in general ROM. No crepitus or effusions noted. Foot type and digits show no abnormalities. Bony prominences are unremarkable. Skin: No Porokeratosis. No infection or ulcers.   Diagnosis:  Pain due to onychomycosis     Treatment & Plan Procedures and Treatment: Consent by patient was obtained for treatment procedures. The patient understood the discussion of treatment and procedures well. All questions were answered thoroughly reviewed. Debridement of mycotic and hypertrophic toenails, 1 through 5 bilateral and clearing of subungual debris. No ulceration, no infection noted. ABN signed for 2019.  Return Visit-Office Procedure: Patient instructed to return to the office for a follow up visit 3 months for continued evaluation and treatment.   Gardiner Barefoot DPM

## 2018-11-24 ENCOUNTER — Other Ambulatory Visit: Payer: Self-pay | Admitting: Internal Medicine

## 2018-12-03 DIAGNOSIS — H4031X2 Glaucoma secondary to eye trauma, right eye, moderate stage: Secondary | ICD-10-CM | POA: Diagnosis not present

## 2018-12-03 DIAGNOSIS — H524 Presbyopia: Secondary | ICD-10-CM | POA: Diagnosis not present

## 2018-12-03 DIAGNOSIS — Z961 Presence of intraocular lens: Secondary | ICD-10-CM | POA: Diagnosis not present

## 2018-12-08 DIAGNOSIS — H401111 Primary open-angle glaucoma, right eye, mild stage: Secondary | ICD-10-CM | POA: Diagnosis not present

## 2018-12-08 DIAGNOSIS — Z961 Presence of intraocular lens: Secondary | ICD-10-CM | POA: Diagnosis not present

## 2019-02-05 ENCOUNTER — Telehealth: Payer: Self-pay

## 2019-02-05 NOTE — Telephone Encounter (Signed)
Pt has declined to make awv appt due to her taking care of her husband. Will call back later . [place]

## 2019-02-18 ENCOUNTER — Ambulatory Visit (INDEPENDENT_AMBULATORY_CARE_PROVIDER_SITE_OTHER): Payer: Medicare Other | Admitting: Podiatry

## 2019-02-18 ENCOUNTER — Encounter: Payer: Self-pay | Admitting: Podiatry

## 2019-02-18 DIAGNOSIS — B351 Tinea unguium: Secondary | ICD-10-CM | POA: Diagnosis not present

## 2019-02-18 DIAGNOSIS — M79676 Pain in unspecified toe(s): Secondary | ICD-10-CM

## 2019-02-18 NOTE — Progress Notes (Signed)
Patient ID: [name], female   DOB: 09/30/1932, 86 y.o.   MRN: [id number] Complaint:  Visit Type: Patient returns to my office for continued preventative foot care services. Complaint: Patient states" my nails have grown long and thick and become painful to walk and wear shoes" . The patient presents for preventative foot care services. No changes to ROS.    Podiatric Exam: Vascular: dorsalis pedis and posterior tibial pulses are palpable bilateral. Capillary return is immediate. Temperature gradient is WNL. Skin turgor WNL  Sensorium: Diminished  Semmes Weinstein monofilament test. Normal tactile sensation bilaterally. Nail Exam: Pt has thick disfigured discolored nails with subungual debris noted bilateral entire nail hallux through fifth toenails Ulcer Exam: There is no evidence of ulcer or pre-ulcerative changes or infection. Orthopedic Exam: Muscle tone and strength are WNL. No limitations in general ROM. No crepitus or effusions noted. Foot type and digits show no abnormalities. Bony prominences are unremarkable. Skin: No Porokeratosis. No infection or ulcers.   Diagnosis:  Pain due to onychomycosis     Treatment & Plan Procedures and Treatment: Consent by patient was obtained for treatment procedures. The patient understood the discussion of treatment and procedures well. All questions were answered thoroughly reviewed. Debridement of mycotic and hypertrophic toenails, 1 through 5 bilateral and clearing of subungual debris. No ulceration, no infection noted.   Return Visit-Office Procedure: Patient instructed to return to the office for a follow up visit 3 months for continued evaluation and treatment.   Aniko Finnigan DPM 

## 2019-04-11 ENCOUNTER — Other Ambulatory Visit: Payer: Self-pay | Admitting: Family Medicine

## 2019-04-11 MED ORDER — AZITHROMYCIN 500 MG PO TABS: 500.0000 mg | ORAL_TABLET | Freq: Every day | ORAL | 0 refills | Status: DC

## 2019-04-11 MED ORDER — PREDNISONE 10 MG (21) PO TBPK: ORAL_TABLET | ORAL | 0 refills | Status: DC

## 2019-04-11 NOTE — Progress Notes (Signed)
She complains of a fever and increased cough and in the past would get a Z pack and prednisone. I will call this in

## 2019-04-15 ENCOUNTER — Encounter: Payer: Self-pay | Admitting: Podiatry

## 2019-04-15 ENCOUNTER — Ambulatory Visit (INDEPENDENT_AMBULATORY_CARE_PROVIDER_SITE_OTHER): Payer: Medicare Other | Admitting: Podiatry

## 2019-04-15 ENCOUNTER — Other Ambulatory Visit: Payer: Self-pay

## 2019-04-15 VITALS — Temp 97.5°F

## 2019-04-15 DIAGNOSIS — B351 Tinea unguium: Secondary | ICD-10-CM | POA: Diagnosis not present

## 2019-04-15 DIAGNOSIS — M79676 Pain in unspecified toe(s): Secondary | ICD-10-CM

## 2019-04-15 NOTE — Progress Notes (Signed)
Patient ID: [name], female   DOB: 1932-06-24, 85 y.o.   MRN: [id number] Complaint:  Visit Type: Patient returns to my office for continued preventative foot care services. Complaint: Patient states" my nails have grown long and thick and become painful to walk and wear shoes" . The patient presents for preventative foot care services. No changes to ROS.    Podiatric Exam: Vascular: dorsalis pedis and posterior tibial pulses are palpable bilateral. Capillary return is immediate. Temperature gradient is WNL. Skin turgor WNL  Sensorium: Diminished  Semmes Weinstein monofilament test. Normal tactile sensation bilaterally. Nail Exam: Pt has thick disfigured discolored nails with subungual debris noted bilateral entire nail hallux through fifth toenails Ulcer Exam: There is no evidence of ulcer or pre-ulcerative changes or infection. Orthopedic Exam: Muscle tone and strength are WNL. No limitations in general ROM. No crepitus or effusions noted. Foot type and digits show no abnormalities. Bony prominences are unremarkable. Skin: No Porokeratosis. No infection or ulcers.   Diagnosis:  Pain due to onychomycosis     Treatment & Plan Procedures and Treatment: Consent by patient was obtained for treatment procedures. The patient understood the discussion of treatment and procedures well. All questions were answered thoroughly reviewed. Debridement of mycotic and hypertrophic toenails, 1 through 5 bilateral and clearing of subungual debris. No ulceration, no infection noted.   Return Visit-Office Procedure: Patient instructed to return to the office for a follow up visit 3 months for continued evaluation and treatment.   Gardiner Barefoot DPM

## 2019-05-04 ENCOUNTER — Ambulatory Visit: Payer: Medicare Other | Admitting: Internal Medicine

## 2019-05-11 ENCOUNTER — Telehealth: Payer: Self-pay | Admitting: Internal Medicine

## 2019-05-11 NOTE — Telephone Encounter (Signed)
Called and spoke Santiago Glad at [facility] [place] regarding pt's upcoming appt with our office on 05/14/19 Pt will need to have a qualifying walk, qualifying O2 sats updated in her chart and new orders placed Routing message to [place] for review

## 2019-05-14 ENCOUNTER — Other Ambulatory Visit: Payer: Self-pay

## 2019-05-14 ENCOUNTER — Encounter: Payer: Self-pay | Admitting: Internal Medicine

## 2019-05-14 ENCOUNTER — Ambulatory Visit (INDEPENDENT_AMBULATORY_CARE_PROVIDER_SITE_OTHER): Payer: Medicare Other | Admitting: Internal Medicine

## 2019-05-14 DIAGNOSIS — J449 Chronic obstructive pulmonary disease, unspecified: Secondary | ICD-10-CM

## 2019-05-14 DIAGNOSIS — J9612 Chronic respiratory failure with hypercapnia: Secondary | ICD-10-CM | POA: Diagnosis not present

## 2019-05-14 MED ORDER — PREDNISONE 10 MG PO TABS: ORAL_TABLET | ORAL | 0 refills | Status: DC

## 2019-05-14 NOTE — Patient Instructions (Signed)
Prednisone 10 mg take  4 each am x 2 days,   2 each am x 2 days,  1 each am x 2 days and stop   For cough/congestion > mucinex dm up to 1200 mg every 12 hours and use the flutter valve as much as possbile  Only use your albuterol as a rescue medication to be used if you can't catch your breath by resting or doing a relaxed purse lip breathing pattern.  - The less you use it, the better it will work when you need it. - Ok to use up to 2 puffs  every 4 hours if you must but call for immediate appointment if use goes up over your usual need - Don't leave home without it !!  (think of it like the spare tire for your car)    Please schedule a follow up visit in 3 months but call sooner if needed

## 2019-05-14 NOTE — Progress Notes (Signed)
Subjective:    Patient ID: [name]     DOB: October 06, 1932     MRN: [id number]  Brief patient profile:  36  yowf quit smoking 02/2013  referred 02/08/2012 by Glade Lloyd for intermittent sob was on 02 but stopped in early 2013 and with GOLD III COPD documented 03/2012     History of Present Illness  11/09/2016  f/u ov/[name] re:   GOLD III / maint perforomist/ bud/Incruse/ prn saba neb  Chief Complaint  Patient presents with  . Follow-up    Pt called on 11/05/16 with c/o cough with yellow sputum- zpack and pred taper given. She is much improved and barely coughing at all at this point. Breathing is at her normal baseline.    baseline doe = MMRC3 = can't walk 100 yards even at a slow pace at a flat grade s stopping due to sob  Even on 02  rec For flares of cough/ wheeze/ short of breath assoc with change in mucus as you have in the past  >>> zpak / Prednisone 10 mg take  4 each am x 2 days,   2 each am x 2 days,  1 each am x 2 days and stop     11/12/2017  f/u ov/[name] re:  GOLD III/ maint  rx - took one cycle zpak /pred since last ov/ using med cal well / 2lpm 24/ 7 continuous, not pulsed Chief Complaint  Patient presents with  . Follow-up    2 liters O2, some sob with activity  MMRC3 = can't walk 100 yards even at a slow pace at a flat grade s stopping due to sob   No need for saba on perf/bud/incruse  rec trelegy a    11/03/2018  f/u ov/[name] re: GOLD III copd / 02 dep / using med calendar well  Chief Complaint  Patient presents with  . Follow-up    Breathing is overall doing well. She has occ cough- prod with yellow to clear sputum. She rarely uses her albuterol.   Dyspnea:  MMRC3 = can't walk 100 yards even at a slow pace at a flat grade s stopping due to sob  On 2lpm Cough: minimum, not exac in am's Sleeping: 2 pillows/ flat bed  SABA use: none 02: 2lpm 24/7  rec No change rx    05/14/2019  f/u ov/[name] re: GOLD III spirometry/ 02 dep hs and with activity  Chief  Complaint  Patient presents with  . Follow-up    States her breathing has been mostly good. She has good days and bad days. Reports a congested cough. Wants to qualify for innogen.   Dyspnea:  MMRC3 = can't walk 100 yards even at a slow pace at a flat grade s stopping due to sob  - can do mailbox and back flat 50 ft on 02 2lpm  Cough: more rattling x sev days / no color/ not using flutter  Sleeping:  Props up x pillows / bed is flat  SABA use:   02: 2lpm    No obvious day to day or daytime variability or assoc excess/ purulent sputum or mucus plugs or hemoptysis or cp or chest tightness, subjective wheeze or overt sinus or hb symptoms.   Sleeping  without nocturnal  or early am exacerbation  of respiratory  c/o's or need for noct saba. Also denies any obvious fluctuation of symptoms with weather or environmental changes or other aggravating or alleviating factors except as outlined above   No  unusual exposure hx or h/o childhood pna/ asthma or knowledge of premature birth.  Current Allergies, Complete Past Medical History, Past Surgical History, Family History, and Social History were reviewed in [employer] record.  ROS  The following are not active complaints unless bolded Hoarseness, sore throat, dysphagia, dental problems, itching, sneezing,  nasal congestion or discharge of excess mucus or purulent secretions, ear ache,   fever, chills, sweats, unintended wt loss or wt gain, classically pleuritic or exertional cp,  orthopnea pnd or arm/hand swelling  or leg swelling, presyncope, palpitations, abdominal pain, anorexia, nausea, vomiting, diarrhea  or change in bowel habits or change in bladder habits, change in stools or change in urine, dysuria, hematuria,  rash, arthralgias, visual complaints, headache, numbness, weakness or ataxia or problems with walking or coordination,  change in mood or  memory.        Current Meds  Medication Sig  . acetaminophen (TYLENOL)  650 MG CR tablet Take 650 mg by mouth every 8 (eight) hours as needed for pain.  Marland Kitchen albuterol (PROVENTIL HFA;VENTOLIN HFA) 108 (90 Base) MCG/ACT inhaler Inhale 2 puffs into the lungs every 4 (four) hours as needed for wheezing or shortness of breath (((PLAN A))). Reported on 01/12/2016  . azithromycin (ZITHROMAX) 500 MG tablet Take 1 tablet (500 mg total) by mouth daily.  . brimonidine (ALPHAGAN) 0.2 % ophthalmic solution   . Cholecalciferol (VITAMIN D) 50 MCG (2000 UT) CAPS Take 1 capsule by mouth daily.  Marland Kitchen dextromethorphan-guaiFENesin (MUCINEX DM) 30-600 MG per 12 hr tablet Take 1-2 tablets by mouth every 12 (twelve) hours as needed (with flutter for cough, congestion, and thick mucus). Reported on 06/27/2016  . OXYGEN Use 2L of O2 continuously  . predniSONE (STERAPRED UNI-PAK 21 TAB) 10 MG (21) TBPK tablet Take as per manufacturers recommendations  . TRELEGY ELLIPTA 100-62.5-25 MCG/INH AEPB INHALE 1 PUFF INTO THE LUNGS DAILY                         Objective:   Physical Exam  Thin chronically ill wf no longer in w/c    Wt 97 02/08/2012 > 03/24/2012  99 > 103 05/09/2012 > 06/12/2012  99 >   09/24/2012  96 > 98 01/05/2013 >  106 05/01/2013 > 05/05/2013  99 > 05/15/2013  104 > 06/01/2013 115 > 115 07/07/2013 > 114  07/17/13 > 127 09/18/2013 > 12/01/2013  131 >135 03/04/2014 > 03/12/2014  133 > 05/27/2014  133  > 06/28/2014  133 >131 07/26/2014 >133 09/21/2014 139 >  04/14/2015  138  >  05/08/2016  146 > 05/10/2017    145 >  11/12/2017  134 >  11/03/2018   144 > 05/14/2019  140     Vital signs reviewed - Note on arrival 02 sats  93% on 2lpm cont     HEENT: nl dentition / oropharynx. Nl external ear canals without cough reflex -  Mild bilateral non-specific turbinate edema     NECK :  without JVD/Nodes/TM/ nl carotid upstrokes bilaterally   LUNGS: no acc muscle use,  Mod barrel  contour chest wall with bilateral  Distant bs s audible wheeze and  without cough on insp or exp maneuver and mod  Hyperresonant  to   percussion bilaterally     CV:  RRR  no s3 or murmur or increase in P2, and no edema   ABD:  soft and nontender with pos mid insp Hoover's  in the  supine position. No bruits or organomegaly appreciated, bowel sounds nl  MS:   Nl gait/  ext warm without deformities, calf tenderness, cyanosis or clubbing No obvious joint restrictions   SKIN: warm and dry without lesions    NEURO:  alert, approp, nl sensorium with  no motor or cerebellar deficits apparent.              Assessment & Plan:

## 2019-05-18 ENCOUNTER — Encounter: Payer: Self-pay | Admitting: Internal Medicine

## 2019-05-18 NOTE — Assessment & Plan Note (Addendum)
Onset 02 rx 2013 initially just at hs   - ono RA 05/14/12   4 h 33 min < 89%   So ordered 02 2lpm    - HCO3  32 05/10/13    - 01/05/2013   Walked RA x one lap @ 185 stopped due to  88%   - 01/05/2013  Walked 2lpm 2 laps @ 185 ft each stopped due to  90% sob but improved vs baseline   - RA 05/01/2013 sats = 87%  - 05/10/2017 > 02 sats 88% RA > referred for POC eval > preferred continuous flow - 05/14/2019   Walked RA  1 laps @  approx 175ft each @ slow pace  stopped due to sob and desats to 84% corrected on 2lpm and completed lap s desats  rec as of 05/14/2019  2lpm sleep and walking, not needed at rest.  Pt using 02 and clearly benefitting from it > not change rx    I had an extended discussion with the patient reviewing all relevant studies completed to date and  lasting 15 to 20 minutes of a 25 minute visit    directly observed portions of ambulatory 02 saturation study  which extended face to face time for this visit.  Each maintenance medication was reviewed in detail including emphasizing most importantly the difference between maintenance and prns and under what circumstances the prns are to be triggered using an action plan format that is not reflected in the computer generated alphabetically organized AVS which I have not found useful in most complex patients, especially with respiratory illnesses  Please see AVS for specific instructions unique to this visit that I personally wrote and verbalized to the the pt in detail and then reviewed with pt  by my nurse highlighting any  changes in therapy recommended at today's visit to their plan of care.

## 2019-05-18 NOTE — Assessment & Plan Note (Addendum)
Quit smoking 02/2013   - PFTs 03/24/2012  FEV1  0.91 (43%) ratio 48 and no better p B2,  DLCO 38% corrects to 53%  - changed to perforomist/bud bid 04/2013  -2015 pulmonary rehab  - med calendar 07/17/13 > did not bring 06/28/2014 , .redone 07/26/2014  - 06/28/2014 p extensive coaching HFA effectiveness =    75% (short Ti) - 08/24/14 started IMPACT trial >finished 08/2015  - PFTs 12/13/14  FEV1 0.85 (38%) ratio 45  p 5 % improvement from saba  09/20/2015 Med calendar  - 05/08/2016 changed lama to incruse  - 11/12/2017  After extensive coaching HFA effectiveness =   75% (short Ti) - 11/12/17 > changed to trelegy      Group D in terms of symptom/risk and laba/lama/ICS  therefore appropriate rx at this point >>>  Continue trelegy  Having mild flare with cough/ congestion rec 1)  Prednisone 10 mg take  4 each am x 2 days,   2 each am x 2 days,  1 each am x 2 days and stop  2) reminded on proper use of flutter valve/ mucinex  3) f/u q 32m

## 2019-05-22 ENCOUNTER — Telehealth: Payer: Self-pay | Admitting: Internal Medicine

## 2019-05-22 NOTE — Telephone Encounter (Signed)
Ambulatory Pulse Oximetry from pt's last OV 5/21:  [place] Name  05/14/19 1254          Resting  Supplemental oxygen during test?  Yes          O2 Flow Rate (L/min)  2 L/min          Type  Continuous          Resting Heart Rate  101          Resting Sp02  100          Lap 1 (250 feet)  HR  136          02 Sat  94          Lap 3 (250 feet)  Tech Comments:  started walking on Room air, patient dropped sats mid lap 1 to 84% on room air, her heart rate got up to 140 at this time. patient was put 2L o@ on and sat in a chair. then patient was able to finish lap and mainta- in o2 sats. no complaints of dizziness just shortness of breath            Above is info from pt's last OV with MW where ambulatory walk was performed. You can see in the comment section that pt was started on room air and dropped to 84% on room air so was then placed on 2L O2. Documentation also shows that pt's final sat on the 2L was 94%.   Attempted to call [facility] to state this to info but the office was closed. Will hold in triage and we will call [facility] Monday when they reopen.

## 2019-05-25 ENCOUNTER — Telehealth: Payer: Self-pay | Admitting: Internal Medicine

## 2019-05-25 NOTE — Telephone Encounter (Signed)
[place] and spoke with Jeneen Rinks in regards to the call from [facility] Friday 5/29. I provided missing info to [facility] for pt's sats at rest on room air, walking on room air, and then when pt had to be placed on O2 due to desat walking and stated to him the number of liters pt was placed on along with the final O2 sat. Jeneen Rinks verbalized understanding with all stated info and stated he would send this over to [place]. Nothing further needed.

## 2019-05-25 NOTE — Telephone Encounter (Signed)
Called and spoke with [name] from [place] asking her if she wanted the OV notes to be sent to her or if she had a form for [place] to fill out with the needed info from her and [name] stated that she needed the OV notes but needed to make sure the info was in there from the walk with pt's sats on room air and then when pt was placed on O2.  [name] stated that the info from pt's walk could be handwritten on the OV and just sign at the info documented so she would have that so I have done just that. Pt's OV has been faxed to [name]. Nothing further needed.

## 2019-06-04 ENCOUNTER — Telehealth: Payer: Self-pay | Admitting: Family Medicine

## 2019-06-04 NOTE — Telephone Encounter (Signed)
Looks like she needs a Tdap.  Have her get it at the drugstore and it will be cheaper for her

## 2019-06-04 NOTE — Telephone Encounter (Signed)
Pt was advised [facility] 

## 2019-06-04 NOTE — Telephone Encounter (Signed)
Pt called and wanted to know if her tetanus is up to date. She cut her arm on the storm door. If she needs one she would like to come in and get one. Please advise pt at [phone].

## 2019-06-11 DIAGNOSIS — H401111 Primary open-angle glaucoma, right eye, mild stage: Secondary | ICD-10-CM | POA: Diagnosis not present

## 2019-07-02 ENCOUNTER — Encounter: Payer: Self-pay | Admitting: Family Medicine

## 2019-07-02 ENCOUNTER — Other Ambulatory Visit: Payer: Self-pay

## 2019-07-02 ENCOUNTER — Ambulatory Visit (INDEPENDENT_AMBULATORY_CARE_PROVIDER_SITE_OTHER): Payer: Medicare Other | Admitting: Family Medicine

## 2019-07-02 ENCOUNTER — Ambulatory Visit: Payer: Medicare Other | Admitting: Internal Medicine

## 2019-07-02 VITALS — BP 132/82 | HR 95 | Temp 98.1°F | Wt 139.0 lb

## 2019-07-02 DIAGNOSIS — H6121 Impacted cerumen, right ear: Secondary | ICD-10-CM

## 2019-07-02 NOTE — Progress Notes (Signed)
   Subjective:    Patient ID: [name], female    DOB: August 31, 1932, 85 y.o.   MRN: [id number]  HPI She is having difficulty hearing mainly from the right ear.  No fever, chills, pain.   Review of Systems     Objective:   Physical Exam Cerumen noted in the right ear canal.  Left canal was normal.       Assessment & Plan:   Encounter Diagnosis  Name Primary?  . Impacted cerumen of right ear Yes  The ear was lavaged without difficulty.

## 2019-07-15 ENCOUNTER — Ambulatory Visit (INDEPENDENT_AMBULATORY_CARE_PROVIDER_SITE_OTHER): Payer: Medicare Other | Admitting: Podiatry

## 2019-07-15 ENCOUNTER — Other Ambulatory Visit: Payer: Self-pay

## 2019-07-15 ENCOUNTER — Encounter: Payer: Self-pay | Admitting: Podiatry

## 2019-07-15 VITALS — Temp 98.1°F

## 2019-07-15 DIAGNOSIS — B351 Tinea unguium: Secondary | ICD-10-CM

## 2019-07-15 DIAGNOSIS — M79676 Pain in unspecified toe(s): Secondary | ICD-10-CM

## 2019-07-15 NOTE — Progress Notes (Signed)
Patient ID: [name], female   DOB: 1932/03/03, 85 y.o.   MRN: [id number] Complaint:  Visit Type: Patient returns to my office for continued preventative foot care services. Complaint: Patient states" my nails have grown long and thick and become painful to walk and wear shoes" . The patient presents for preventative foot care services. No changes to ROS.    Podiatric Exam: Vascular: dorsalis pedis and posterior tibial pulses are palpable bilateral. Capillary return is immediate. Temperature gradient is WNL. Skin turgor WNL  Sensorium: Diminished  Semmes Weinstein monofilament test. Normal tactile sensation bilaterally. Nail Exam: Pt has thick disfigured discolored nails with subungual debris noted bilateral entire nail hallux through fifth toenails Ulcer Exam: There is no evidence of ulcer or pre-ulcerative changes or infection. Orthopedic Exam: Muscle tone and strength are WNL. No limitations in general ROM. No crepitus or effusions noted. Foot type and digits show no abnormalities. Bony prominences are unremarkable. Skin: No Porokeratosis. No infection or ulcers.   Diagnosis:  Pain due to onychomycosis     Treatment & Plan Procedures and Treatment: Consent by patient was obtained for treatment procedures. The patient understood the discussion of treatment and procedures well. All questions were answered thoroughly reviewed. Debridement of mycotic and hypertrophic toenails, 1 through 5 bilateral and clearing of subungual debris. No ulceration, no infection noted.   Return Visit-Office Procedure: Patient instructed to return to the office for a follow up visit 3 months for continued evaluation and treatment.   Gardiner Barefoot DPM

## 2019-08-09 ENCOUNTER — Other Ambulatory Visit: Payer: Self-pay | Admitting: Family Medicine

## 2019-08-09 MED ORDER — AZITHROMYCIN 500 MG PO TABS: 500.0000 mg | ORAL_TABLET | Freq: Every day | ORAL | 0 refills | Status: DC

## 2019-08-09 NOTE — Progress Notes (Signed)
She had COPD and has a fever.Wants an antibiotic. I will call in a Z pack

## 2019-08-10 ENCOUNTER — Other Ambulatory Visit: Payer: Self-pay

## 2019-08-10 ENCOUNTER — Ambulatory Visit (INDEPENDENT_AMBULATORY_CARE_PROVIDER_SITE_OTHER): Payer: Medicare Other | Admitting: Internal Medicine

## 2019-08-10 ENCOUNTER — Encounter: Payer: Self-pay | Admitting: Internal Medicine

## 2019-08-10 DIAGNOSIS — J9612 Chronic respiratory failure with hypercapnia: Secondary | ICD-10-CM | POA: Diagnosis not present

## 2019-08-10 DIAGNOSIS — J449 Chronic obstructive pulmonary disease, unspecified: Secondary | ICD-10-CM

## 2019-08-10 NOTE — Progress Notes (Signed)
Subjective:    Patient ID: [name]     DOB: January 04, 1932     MRN: [id number]  Brief patient profile:  67  yowf quit smoking 02/2013  referred 02/08/2012 by Glade Lloyd for intermittent sob was on 02 but stopped in early 2013 and with GOLD III COPD documented 03/2012     History of Present Illness  11/09/2016  f/u ov/Wert re:   GOLD III / maint perforomist/ bud/Incruse/ prn saba neb  Chief Complaint  Patient presents with  . Follow-up    Pt called on 11/05/16 with c/o cough with yellow sputum- zpack and pred taper given. She is much improved and barely coughing at all at this point. Breathing is at her normal baseline.    baseline doe = MMRC3 = can't walk 100 yards even at a slow pace at a flat grade s stopping due to sob  Even on 02  rec For flares of cough/ wheeze/ short of breath assoc with change in mucus as you have in the past  >>> zpak / Prednisone 10 mg take  4 each am x 2 days,   2 each am x 2 days,  1 each am x 2 days and stop     11/12/2017  f/u ov/Wert re:  GOLD III/ maint  rx - took one cycle zpak /pred since last ov/ using med cal well / 2lpm 24/ 7 continuous, not pulsed Chief Complaint  Patient presents with  . Follow-up    2 liters O2, some sob with activity  MMRC3 = can't walk 100 yards even at a slow pace at a flat grade s stopping due to sob   No need for saba on perf/bud/incruse  rec trelegy a    11/03/2018  f/u ov/Wert re: GOLD III copd / 02 dep / using med calendar well  Chief Complaint  Patient presents with  . Follow-up    Breathing is overall doing well. She has occ cough- prod with yellow to clear sputum. She rarely uses her albuterol.   Dyspnea:  MMRC3 = can't walk 100 yards even at a slow pace at a flat grade s stopping due to sob  On 2lpm Cough: minimum, not exac in am's Sleeping: 2 pillows/ flat bed  SABA use: none 02: 2lpm 24/7  rec No change rx    05/14/2019  f/u ov/Wert re: GOLD III spirometry/ 02 dep hs and with activity  Chief  Complaint  Patient presents with  . Follow-up    States her breathing has been mostly good. She has good days and bad days. Reports a congested cough. Wants to qualify for innogen.   Dyspnea:  MMRC3 = can't walk 100 yards even at a slow pace at a flat grade s stopping due to sob  - can do mailbox and back flat 50 ft on 02 2lpm  Cough: more rattling x sev days / no color/ not using flutter  Sleeping:  Props up x pillows / bed is flat  SABA use:   02: 2lpm  rec Prednisone 10 mg take  4 each am x 2 days,   2 each am x 2 days,  1 each am x 2 days and stop  For cough/congestion > mucinex dm up to 1200 mg every 12 hours and use the flutter valve as much as possbile Only use your albuterol as a rescue medication      08/10/2019  f/u ov/Wert re:  GOLD III   spirometry  But  02  dep 2lpm 24/7  maint on trelegy  Chief Complaint  Patient presents with  . Follow-up    Patient reports that she is doing better. She still has sob and cough. She reports not having to use her rescue inhaler.  Dyspnea:  MMRC3 = can't walk 100 yards even at a slow pace at a flat grade s stopping due to sob   Cough: none   Sleeping: props up on 2 pillows  SABA use: none  02: 2lpm 24/7 does not titrate  Acutely ill 08/08/19 with fatigue, chills and fever rx by PCP with zpak and better on day of ov but did not disclose this to the COVID19 screeners as passed the screening questions 08/07/2019  No obvious day to day or daytime variability or assoc excess/ purulent sputum or mucus plugs or hemoptysis or cp or chest tightness, subjective wheeze or overt sinus or hb symptoms.   Sleeping as above  without nocturnal  or early am exacerbation  of respiratory  c/o's or need for noct saba. Also denies any obvious fluctuation of symptoms with weather or environmental changes or other aggravating or alleviating factors except as outlined above   No unusual exposure hx or h/o childhood pna/ asthma or knowledge of premature  birth.  Current Allergies, Complete Past Medical History, Past Surgical History, Family History, and Social History were reviewed in [employer] record.  ROS  The following are not active complaints unless bolded Hoarseness, sore throat, dysphagia, dental problems, itching, sneezing,  nasal congestion or discharge of excess mucus or purulent secretions, ear ache,   fever, chills, sweats, unintended wt loss or wt gain, classically pleuritic or exertional cp,  orthopnea pnd or arm/hand swelling  or leg swelling, presyncope, palpitations, abdominal pain, anorexia, nausea, vomiting, diarrhea  or change in bowel habits or change in bladder habits, change in stools or change in urine, dysuria, hematuria,  rash, arthralgias, visual complaints, headache, numbness, weakness or ataxia or problems with walking or coordination,  change in mood or  memory.        Current Meds  Medication Sig  . acetaminophen (TYLENOL) 650 MG CR tablet Take 650 mg by mouth every 8 (eight) hours as needed for pain.  Marland Kitchen albuterol (PROVENTIL HFA;VENTOLIN HFA) 108 (90 Base) MCG/ACT inhaler Inhale 2 puffs into the lungs every 4 (four) hours as needed for wheezing or shortness of breath (((PLAN A))). Reported on 01/12/2016  . azithromycin (ZITHROMAX) 500 MG tablet Take 1 tablet (500 mg total) by mouth daily.  . brimonidine (ALPHAGAN) 0.2 % ophthalmic solution   . Cholecalciferol (VITAMIN D) 50 MCG (2000 UT) CAPS Take 1 capsule by mouth daily.  Marland Kitchen dextromethorphan-guaiFENesin (MUCINEX DM) 30-600 MG per 12 hr tablet Take 1-2 tablets by mouth every 12 (twelve) hours as needed (with flutter for cough, congestion, and thick mucus). Reported on 06/27/2016  . OXYGEN Use 2L of O2 continuously  . TRELEGY ELLIPTA 100-62.5-25 MCG/INH AEPB INHALE 1 PUFF INTO THE LUNGS DAILY            Objective:   Physical Exam  Chronically ill wf w/c bound nad     Wt 97 02/08/2012 > 03/24/2012  99 > 103 05/09/2012 > 06/12/2012  99 >    09/24/2012  96 > 98 01/05/2013 >  106 05/01/2013 > 05/05/2013  99 > 05/15/2013  104 > 06/01/2013 115 > 115 07/07/2013 > 114  07/17/13 > 127 09/18/2013 > 12/01/2013  131 >135 03/04/2014 > 03/12/2014  133 > 05/27/2014  133  > 06/28/2014  133 >131 07/26/2014 >133 09/21/2014 139 >  04/14/2015  138  >  05/08/2016  146 > 05/10/2017    145 >  11/12/2017  134 >  11/03/2018   144 > 05/14/2019  140 > 08/10/2019   137    Vital signs reviewed - Note on arrival 02 sats  95% on 2lpm pulsed    HEENT: nl dentition / oropharynx. Nl external ear canals without cough reflex -  Mild bilateral non-specific turbinate edema     NECK :  without JVD/Nodes/TM/ nl carotid upstrokes bilaterally   LUNGS: no acc muscle use,  Mod barrel  contour chest wall with bilateral  Distant bs s audible wheeze and  without cough on insp or exp maneuver and mod  Hyperresonant  to  percussion bilaterally     CV:  RRR  no s3 or murmur or increase in P2, and no edema   ABD:  soft and nontender with pos mid insp Hoover's  in the supine position. No bruits or organomegaly appreciated, bowel sounds nl  MS:     ext warm without deformities, calf tenderness, cyanosis or clubbing No obvious joint restrictions   SKIN: warm and dry without lesions    NEURO:  alert, approp, nl sensorium with  no motor or cerebellar deficits apparent.                  Assessment & Plan:

## 2019-08-10 NOTE — Patient Instructions (Signed)
No change medications    Please schedule a follow up visit in 6 months but call sooner if needed  

## 2019-08-11 ENCOUNTER — Encounter: Payer: Self-pay | Admitting: Internal Medicine

## 2019-08-11 NOTE — Assessment & Plan Note (Signed)
Quit smoking 02/2013   - PFTs 03/24/2012  FEV1  0.91 (43%) ratio 48 and no better p B2,  DLCO 38% corrects to 53%  - changed to perforomist/bud bid 04/2013  -2015 pulmonary rehab  - med calendar 07/17/13 > did not bring 06/28/2014 , .redone 07/26/2014  - 06/28/2014 p extensive coaching HFA effectiveness =    75% (short Ti) - 08/24/14 started IMPACT trial >finished 08/2015  - PFTs 12/13/14  FEV1 0.85 (38%) ratio 45  p 5 % improvement from saba  09/20/2015 Med calendar  - 05/08/2016 changed lama to incruse  - 11/12/2017  After extensive coaching HFA effectiveness =   75% (short Ti) - 11/12/17 > changed to trelegy     Group D in terms of symptom/risk and laba/lama/ICS  therefore appropriate rx at this point >>>  Continue trelegy  Advised in future we can't assume that all flares of copd, esp assoc with fever, are "just a virus" and she should let us know prior to arrival when this is the case.   For now no change in rx needed.

## 2019-08-11 NOTE — Assessment & Plan Note (Signed)
Onset 02 rx 2013 initially just at hs   - ono RA 05/14/12   4 h 33 min < 89%   So ordered 02 2lpm    - HCO3  32 05/10/13    - 01/05/2013   Walked RA x one lap @ 185 stopped due to  88%   - 01/05/2013  Walked 2lpm 2 laps @ 185 ft each stopped due to  90% sob but improved vs baseline   - RA 05/01/2013 sats = 87%  - 05/10/2017 > 02 sats 88% RA > referred for POC eval > preferred continuous flow - 05/14/2019   Walked RA  1 laps @  approx 166ft each @ slow pace  stopped due to sob and desats to 84% corrected on 2lpm and completed lap s desats  rec as of 08/10/2019  2lpm 24/7

## 2019-08-13 ENCOUNTER — Telehealth: Payer: Self-pay | Admitting: Internal Medicine

## 2019-08-13 NOTE — Telephone Encounter (Signed)
Returned call to patient with Dr. Gustavus Bryant instructions to report to ED if not improving or if condition is worsening.  No new recommendations for antibiotics due to history of allergies.Patient acknowledged understanding and had no further questions.  Nothing further needed.

## 2019-08-13 NOTE — Telephone Encounter (Signed)
Unfortunately allergic to too many of the substitutes but if doing worse should go to er and be tested for covid and may need admit

## 2019-08-13 NOTE — Telephone Encounter (Signed)
Call returned to patient, she reports she saw MW Monday but she still is not feeling good. She was recently given a Zpack on Sunday by her PCP. She does not feel it helped. She reports coughing up thick yellow and green mucuos. Denies chest pain. She is using albuterol prn, trelegy daily and she took a mucinex this morning that she thinks helped. She reports she went to her husbands grave and got out with a friend and she thinks that may not have been a good idea. She is concerned the Zpack may not have been long enough. Audibly wheezing on the phone. Requesting recommendations from [facility].   Pharmacy: [facility] [address] in [place].   MW please advise. Thanks.

## 2019-09-11 DIAGNOSIS — H401111 Primary open-angle glaucoma, right eye, mild stage: Secondary | ICD-10-CM | POA: Diagnosis not present

## 2019-09-17 DIAGNOSIS — Z23 Encounter for immunization: Secondary | ICD-10-CM | POA: Diagnosis not present

## 2019-10-20 ENCOUNTER — Other Ambulatory Visit: Payer: Self-pay

## 2019-10-20 ENCOUNTER — Encounter: Payer: Self-pay | Admitting: Podiatry

## 2019-10-20 ENCOUNTER — Ambulatory Visit (INDEPENDENT_AMBULATORY_CARE_PROVIDER_SITE_OTHER): Payer: Medicare Other | Admitting: Podiatry

## 2019-10-20 DIAGNOSIS — M79676 Pain in unspecified toe(s): Secondary | ICD-10-CM | POA: Diagnosis not present

## 2019-10-20 DIAGNOSIS — B351 Tinea unguium: Secondary | ICD-10-CM

## 2019-10-20 NOTE — Progress Notes (Signed)
Patient ID: [name], female   DOB: June 25, 1932, 85 y.o.   MRN: [id number] Complaint:  Visit Type: Patient returns to my office for continued preventative foot care services. Complaint: Patient states" my nails have grown long and thick and become painful to walk and wear shoes" . The patient presents for preventative foot care services. No changes to ROS.    Podiatric Exam: Vascular: dorsalis pedis and posterior tibial pulses are palpable bilateral. Capillary return is immediate. Temperature gradient is WNL. Skin turgor WNL  Sensorium: Diminished  Semmes Weinstein monofilament test. Normal tactile sensation bilaterally. Nail Exam: Pt has thick disfigured discolored nails with subungual debris noted bilateral entire nail hallux through fifth toenails Ulcer Exam: There is no evidence of ulcer or pre-ulcerative changes or infection. Orthopedic Exam: Muscle tone and strength are WNL. No limitations in general ROM. No crepitus or effusions noted. Foot type and digits show no abnormalities. Bony prominences are unremarkable. Skin: No Porokeratosis. No infection or ulcers.   Diagnosis:  Pain due to onychomycosis     Treatment & Plan Procedures and Treatment: Consent by patient was obtained for treatment procedures. The patient understood the discussion of treatment and procedures well. All questions were answered thoroughly reviewed. Debridement of mycotic and hypertrophic toenails, 1 through 5 bilateral and clearing of subungual debris. No ulceration, no infection noted.   Return Visit-Office Procedure: Patient instructed to return to the office for a follow up visit 3 months for continued evaluation and treatment.   Gardiner Barefoot DPM

## 2019-11-16 ENCOUNTER — Other Ambulatory Visit: Payer: Self-pay

## 2019-11-20 ENCOUNTER — Other Ambulatory Visit: Payer: Self-pay | Admitting: Primary Care

## 2019-11-20 MED ORDER — TRELEGY ELLIPTA 100-62.5-25 MCG/INH IN AEPB: 1.0000 | INHALATION_SPRAY | Freq: Every morning | RESPIRATORY_TRACT | 11 refills | Status: DC

## 2019-11-20 NOTE — Addendum Note (Signed)
Addended by: Rodman Pickle on: 11/20/2019 06:03 PM   Modules accepted: Orders

## 2019-11-23 ENCOUNTER — Other Ambulatory Visit: Payer: Self-pay | Admitting: Internal Medicine

## 2019-11-23 ENCOUNTER — Telehealth: Payer: Self-pay | Admitting: Internal Medicine

## 2019-11-26 NOTE — Telephone Encounter (Signed)
Nothing noted in encounter. Will sign off.  

## 2019-12-07 ENCOUNTER — Other Ambulatory Visit: Payer: Self-pay

## 2019-12-07 ENCOUNTER — Ambulatory Visit (INDEPENDENT_AMBULATORY_CARE_PROVIDER_SITE_OTHER): Payer: Medicare Other | Admitting: Family Medicine

## 2019-12-07 ENCOUNTER — Encounter: Payer: Self-pay | Admitting: Family Medicine

## 2019-12-07 VITALS — Temp 96.0°F | Wt 137.0 lb

## 2019-12-07 DIAGNOSIS — J441 Chronic obstructive pulmonary disease with (acute) exacerbation: Secondary | ICD-10-CM

## 2019-12-07 DIAGNOSIS — Z87891 Personal history of nicotine dependence: Secondary | ICD-10-CM | POA: Diagnosis not present

## 2019-12-07 DIAGNOSIS — J449 Chronic obstructive pulmonary disease, unspecified: Secondary | ICD-10-CM | POA: Diagnosis not present

## 2019-12-07 MED ORDER — AZITHROMYCIN 500 MG PO TABS: 500.0000 mg | ORAL_TABLET | Freq: Every day | ORAL | 0 refills | Status: DC

## 2019-12-07 NOTE — Progress Notes (Signed)
   Subjective:    Patient ID: [name], female    DOB: Mar 09, 1932, 85 y.o.   MRN: [id number]  HPI She has underlying history of COPD and presently on inhalers as well as oxygen.  She quit smoking several years ago.  Normally she cost 3 times per day and most recently she did notice purulent productive cough.  In the past she had been given a Z-Pak which helped.   Review of Systems     Objective:   Physical Exam  Alert and in no apparent distress with her speaking in no apparent shortness of breath.      Assessment & Plan:  COPD exacerbation ([place]) - Plan: azithromycin (ZITHROMAX) 500 MG tablet  COPD GOLD III  Former smoker She will call if further problems.

## 2019-12-08 ENCOUNTER — Other Ambulatory Visit: Payer: Self-pay

## 2019-12-08 DIAGNOSIS — J441 Chronic obstructive pulmonary disease with (acute) exacerbation: Secondary | ICD-10-CM

## 2019-12-08 MED ORDER — AZITHROMYCIN 500 MG PO TABS: 500.0000 mg | ORAL_TABLET | Freq: Every day | ORAL | 0 refills | Status: DC

## 2019-12-08 NOTE — Telephone Encounter (Signed)
Pt was advised [facility] 

## 2019-12-22 ENCOUNTER — Telehealth: Payer: Self-pay | Admitting: Internal Medicine

## 2019-12-22 NOTE — Telephone Encounter (Signed)
Called the patient back and she stated the Trelegy she "used" did not have a count or number of remaining doses left on it. She said it was "black" and did not have the normal feeling she gets when she uses the medication.   I was able to confirm she does have another Trelegy at the home that was full and ready to be used. The patient accidently used one that was empty and could not dispense.  Advised the patient to follow the regular process of taking her medication in the morning as she always does and that if when she used the "empty" Trelegy she did not get that feeling in her mouth she normally gets, it had nothing to dispense. Patient voiced understanding, nothing further needed at this time.

## 2020-01-19 ENCOUNTER — Ambulatory Visit: Payer: Medicare Other | Admitting: Podiatry

## 2020-01-19 ENCOUNTER — Other Ambulatory Visit: Payer: Self-pay

## 2020-01-26 ENCOUNTER — Encounter: Payer: Self-pay | Admitting: Podiatry

## 2020-01-26 ENCOUNTER — Other Ambulatory Visit: Payer: Self-pay

## 2020-01-26 ENCOUNTER — Ambulatory Visit (INDEPENDENT_AMBULATORY_CARE_PROVIDER_SITE_OTHER): Payer: Medicare Other | Admitting: Podiatry

## 2020-01-26 DIAGNOSIS — B351 Tinea unguium: Secondary | ICD-10-CM | POA: Diagnosis not present

## 2020-01-26 DIAGNOSIS — M79676 Pain in unspecified toe(s): Secondary | ICD-10-CM

## 2020-01-26 NOTE — Progress Notes (Signed)
Patient ID: [name], female   DOB: June 25, 1932, 86 y.o.   MRN: [id number] Complaint:  Visit Type: Patient returns to my office for continued preventative foot care services. Complaint: Patient states" my nails have grown long and thick and become painful to walk and wear shoes" . The patient presents for preventative foot care services. No changes to ROS.    Podiatric Exam: Vascular: dorsalis pedis and posterior tibial pulses are palpable bilateral. Capillary return is immediate. Temperature gradient is WNL. Skin turgor WNL  Sensorium: Diminished  Semmes Weinstein monofilament test. Normal tactile sensation bilaterally. Nail Exam: Pt has thick disfigured discolored nails with subungual debris noted bilateral entire nail hallux through fifth toenails Ulcer Exam: There is no evidence of ulcer or pre-ulcerative changes or infection. Orthopedic Exam: Muscle tone and strength are WNL. No limitations in general ROM. No crepitus or effusions noted. Foot type and digits show no abnormalities. Bony prominences are unremarkable. Skin: No Porokeratosis. No infection or ulcers.   Diagnosis:  Pain due to onychomycosis     Treatment & Plan Procedures and Treatment: Consent by patient was obtained for treatment procedures. The patient understood the discussion of treatment and procedures well. All questions were answered thoroughly reviewed. Debridement of mycotic and hypertrophic toenails, 1 through 5 bilateral and clearing of subungual debris. No ulceration, no infection noted.   Return Visit-Office Procedure: Patient instructed to return to the office for a follow up visit 3 months for continued evaluation and treatment.   Gardiner Barefoot DPM

## 2020-02-04 ENCOUNTER — Telehealth: Payer: Self-pay | Admitting: Family Medicine

## 2020-02-04 NOTE — Telephone Encounter (Signed)
Document this 

## 2020-02-04 NOTE — Telephone Encounter (Signed)
Chart was updated. [place]

## 2020-02-04 NOTE — Telephone Encounter (Signed)
Pt called and wanted to let you know that she got both of her COVID shots one on Jan the 9th and second one on Feb the 9th

## 2020-02-09 ENCOUNTER — Other Ambulatory Visit: Payer: Self-pay

## 2020-02-09 ENCOUNTER — Ambulatory Visit (INDEPENDENT_AMBULATORY_CARE_PROVIDER_SITE_OTHER): Payer: Medicare Other | Admitting: Medical

## 2020-02-09 ENCOUNTER — Encounter: Payer: Self-pay | Admitting: Medical

## 2020-02-09 VITALS — Ht 66.0 in | Wt 142.0 lb

## 2020-02-09 DIAGNOSIS — J441 Chronic obstructive pulmonary disease with (acute) exacerbation: Secondary | ICD-10-CM

## 2020-02-09 DIAGNOSIS — R05 Cough: Secondary | ICD-10-CM | POA: Diagnosis not present

## 2020-02-09 DIAGNOSIS — R059 Cough, unspecified: Secondary | ICD-10-CM

## 2020-02-09 MED ORDER — PREDNISONE 10 MG PO TABS: ORAL_TABLET | ORAL | 0 refills | Status: DC

## 2020-02-09 MED ORDER — DOXYCYCLINE HYCLATE 100 MG PO TABS: 100.0000 mg | ORAL_TABLET | Freq: Two times a day (BID) | ORAL | 0 refills | Status: DC

## 2020-02-09 NOTE — Progress Notes (Signed)
This visit type was conducted due to national recommendations for restrictions regarding the COVID-19 Pandemic (e.g. social distancing) in an effort to limit this patient's exposure and mitigate transmission in our community.  Due to their co-morbid illnesses, this patient is at least at moderate risk for complications without adequate follow up.  This format is felt to be most appropriate for this patient at this time.    Documentation for virtual audio and video telecommunications through Zoom encounter:  The patient was located at home. The provider was located in the office. The patient did consent to this visit and is aware of possible charges through their insurance for this visit.  The other persons participating in this telemedicine service were none. Time spent on call was 20 minutes and in review of previous records 20 minutes total.  This virtual service is not related to other E/M service within previous 7 days.  Subjective: HPI Chief Complaint  Patient presents with  . Cough   Virtual consult for cough.  Having cough for 3-4 days.   No sore throat, no body aches, no chills.  No nausea, no diarrhea, no vomiting.  No fever.  Has some sneezing.  No runny nose.   No headaches.  No change in taste or smell.  No sick contacts.   Lives alone.  Hasn't really been out of the house much.   Has a friend a getting her groceries for her.   Been taking mucinex.  Cough can be productive with clear to yellow phlegm.   No fever.  On oxygen, 2l/min full time.   Has felt a little worse SOB the last few days.   Water intake is good.  Still using Trelegy inhaler.   Has albuterol but hasn't felt the need to use this.   No other aggravating or relieving factors. No other complaint.  Past Medical History:  Diagnosis Date  . Asthma   . COPD (chronic obstructive pulmonary disease) ([place]) 01/2010   Dr. Melvyn Novas;  . Cor pulmonale [facility])   . Former smoker    quit 02/2013  . H/O bone density study 2008   osteoporosis, improved density on Fosamax at that time  . Hypoxia 01/2013   hospitalization, Bipap therapy, COPD exacerbation  . Osteoporosis    Current Outpatient Medications on File Prior to Visit  Medication Sig Dispense Refill  . albuterol (PROVENTIL HFA;VENTOLIN HFA) 108 (90 Base) MCG/ACT inhaler Inhale 2 puffs into the lungs every 4 (four) hours as needed for wheezing or shortness of breath (((PLAN A))). Reported on 01/12/2016 1 Inhaler 2  . Cholecalciferol (VITAMIN D) 50 MCG (2000 UT) CAPS Take 1 capsule by mouth daily.    . Fluticasone-Umeclidin-Vilant (TRELEGY ELLIPTA) 100-62.5-25 MCG/INH AEPB Inhale 1 puff into the lungs every morning. 60 each 11  . OXYGEN Use 2L of O2 continuously    . acetaminophen (TYLENOL) 650 MG CR tablet Take 650 mg by mouth every 8 (eight) hours as needed for pain.    . brimonidine (ALPHAGAN) 0.2 % ophthalmic solution     . dextromethorphan-guaiFENesin (MUCINEX DM) 30-600 MG per 12 hr tablet Take 1-2 tablets by mouth every 12 (twelve) hours as needed (with flutter for cough, congestion, and thick mucus). Reported on 06/27/2016     Current Facility-Administered Medications on File Prior to Visit  Medication Dose Route Frequency Provider Last Rate Last Admin  . influenza  inactive virus vaccine (FLUZONE/FLUARIX) injection 0.5 mL  0.5 mL Intramuscular Once Denita Lung, MD  Review of Systems As in subjective    Objective:   Physical Exam Due to coronavirus pandemic stay at home measures, patient visit was virtual and they were not examined in person.    Talking in complete sentences.  No acute distress.  Not particularly winded.  Ht 5\' 6"  (1.676 m)   Wt 142 lb (64.4 kg)   BMI 22.92 kg/m        Assessment:     Encounter Diagnoses  Name Primary?  Marland Kitchen COPD exacerbation ([place]) Yes  . Cough         Plan:     Discussed symptoms, concerns.  Discussed limitations of virtual consult.  Begin medications above, rest, hydrate well, c/t trelegy,  c/t oxygen therapy as usual. Can increase albuterol or start this since she hasn't been using this the last few days.  If not improving or worse in the next 48 hours, call , recheck or get re-evaluated in person or emergency dept.  She hasn't really been out of the house, but did recommend covid screen through pharmacy drive thru.  She seems low risk for covid currently  She will consider.    [name] was seen today for cough.  Diagnoses and all orders for this visit:  COPD exacerbation ([place])  Cough  Other orders -     doxycycline (VIBRA-TABS) 100 MG tablet; Take 1 tablet (100 mg total) by mouth 2 (two) times daily. -     predniSONE (DELTASONE) 10 MG tablet; Take  4 each am x 2 days,   2 each am x 2 days,  1 each am x 2 days and stop

## 2020-02-11 ENCOUNTER — Ambulatory Visit (INDEPENDENT_AMBULATORY_CARE_PROVIDER_SITE_OTHER): Payer: Medicare Other | Admitting: Internal Medicine

## 2020-02-11 ENCOUNTER — Encounter: Payer: Self-pay | Admitting: Internal Medicine

## 2020-02-11 DIAGNOSIS — J9612 Chronic respiratory failure with hypercapnia: Secondary | ICD-10-CM

## 2020-02-11 DIAGNOSIS — J449 Chronic obstructive pulmonary disease, unspecified: Secondary | ICD-10-CM

## 2020-02-11 NOTE — Assessment & Plan Note (Signed)
Onset 02 rx 2013 initially just at hs   - ono RA 05/14/12   4 h 33 min < 89%   So ordered 02 2lpm    - HCO3  32 05/10/13    - 01/05/2013   Walked RA x one lap @ 185 stopped due to  88%   - 01/05/2013  Walked 2lpm 2 laps @ 185 ft each stopped due to  90% sob but improved vs baseline   - RA 05/01/2013 sats = 87%  - 05/10/2017 > 02 sats 88% RA > referred for POC eval > preferred continuous flow - 05/14/2019   Walked RA  1 laps @  approx 159ft each @ slow pace  stopped due to sob and desats to 84% corrected on 2lpm and completed lap s desats  rec as of 02/11/2020  2lpm 24/7     Adequate control on present rx, reviewed in detail with pt > no change in rx needed    Each maintenance medication was reviewed in detail including most importantly the difference between maintenance and as needed and under what circumstances the prns are to be used.  Please see AVS for specific  Instructions which are unique to this visit and I personally typed out  which were reviewed in detail over the phone with the patient and a copy provided via regular mail  >>> f/u q 6 m, sooner prn

## 2020-02-11 NOTE — Assessment & Plan Note (Signed)
Quit smoking 02/2013   - PFTs 03/24/2012  FEV1  0.91 (43%) ratio 48 and no better p B2,  DLCO 38% corrects to 53%  - changed to perforomist/bud bid 04/2013  -2015 pulmonary rehab  - med calendar 07/17/13 > did not bring 06/28/2014 , .redone 07/26/2014  - 06/28/2014 p extensive coaching HFA effectiveness =    75% (short Ti) - 08/24/14 started IMPACT trial >finished 08/2015  - PFTs 12/13/14  FEV1 0.85 (38%) ratio 45  p 5 % improvement from saba  09/20/2015 Med calendar  - 05/08/2016 changed lama to incruse  - 11/12/2017  After extensive coaching HFA effectiveness =   75% (short Ti) - 11/12/17 > changed to trelegy      Group D in terms of symptom/risk and laba/lama/ICS  therefore appropriate rx at this point >>>  trelegy appropriate, working well

## 2020-02-11 NOTE — Patient Instructions (Signed)
No change in medications  We will call you for appt in 6 - call us sooner if needed

## 2020-02-11 NOTE — Progress Notes (Signed)
Subjective:    Patient ID: [name]     DOB: 1932/11/07     MRN: [id number]  Brief patient profile:  31  yowf quit smoking 02/2013  referred 02/08/2012 by Glade Lloyd for intermittent sob was on 02 but stopped in early 2013 and with GOLD III COPD documented 03/2012     History of Present Illness  11/09/2016  f/u ov/Kaysen Deal re:   GOLD III / maint perforomist/ bud/Incruse/ prn saba neb  Chief Complaint  Patient presents with  . Follow-up    Pt called on 11/05/16 with c/o cough with yellow sputum- zpack and pred taper given. She is much improved and barely coughing at all at this point. Breathing is at her normal baseline.    baseline doe = MMRC3 = can't walk 100 yards even at a slow pace at a flat grade s stopping due to sob  Even on 02  rec For flares of cough/ wheeze/ short of breath assoc with change in mucus as you have in the past  >>> zpak / Prednisone 10 mg take  4 each am x 2 days,   2 each am x 2 days,  1 each am x 2 days and stop     11/12/2017  f/u ov/Rockford Leinen re:  GOLD III/ maint  rx - took one cycle zpak /pred since last ov/ using med cal well / 2lpm 24/ 7 continuous, not pulsed Chief Complaint  Patient presents with  . Follow-up    2 liters O2, some sob with activity  MMRC3 = can't walk 100 yards even at a slow pace at a flat grade s stopping due to sob   No need for saba on perf/bud/incruse  rec trelegy     08/10/2019  f/u ov/Rayley Gao re:  GOLD III   spirometry  But  02 dep 2lpm 24/7  maint on trelegy  Chief Complaint  Patient presents with  . Follow-up    Patient reports that she is doing better. She still has sob and cough. She reports not having to use her rescue inhaler.  Dyspnea:  MMRC3 = can't walk 100 yards even at a slow pace at a flat grade s stopping due to sob   Cough: none   Sleeping: props up on 2 pillows  SABA use: none  02: 2lpm 24/7 does not titrate  Acutely ill 08/08/19 with fatigue, chills and fever rx by PCP with zpak and better on day of ov but did  not disclose this to the COVID19 screeners as passed the screening questions 08/07/2019 rec No change rx    Virtual Visit via Telephone Note 02/11/2020  S/p 2nd shot covid 19 on 02/02/20  I connected with [name] on 02/11/20 at 11:20 AM EST by telephone and verified that I am speaking with the correct person using two identifiers.   I discussed the limitations, risks, security and privacy concerns of performing an evaluation and management service by telephone and the availability of in person appointments. I also discussed with the patient that there may be a patient responsible charge related to this service. The patient expressed understanding and agreed to proceed.   History of Present Illness: GOLD III/ maint on trelegy  Dyspnea:  Same = MMRC3 = can't walk 100 yards even at a slow pace at a flat grade s stopping due to sob   Cough: no Sleeping: 2 pillows SABA use: none  02: 2 lpm 24/7    No obvious day to day or  daytime variability or assoc excess/ purulent sputum or mucus plugs or hemoptysis or cp or chest tightness, subjective wheeze or overt sinus or hb symptoms.    Also denies any obvious fluctuation of symptoms with weather or environmental changes or other aggravating or alleviating factors except as outlined above.   Meds reviewed/ med reconciliation completed         Observations/Objective: Speaking full sentences / min hoarseness/ min rattling on volutary cough    Assessment and Plan: See problem list for active a/p's   Follow Up Instructions: See avs for instructions unique to this ov which includes revised/ updated med list     I discussed the assessment and treatment plan with the patient. The patient was provided an opportunity to ask questions and all were answered. The patient agreed with the plan and demonstrated an understanding of the instructions.   The patient was advised to call back or seek an in-person evaluation if the symptoms worsen or  if the condition fails to improve as anticipated.  I provided 15 minutes of non-face-to-face time during this encounter.   Christinia Gully, MD

## 2020-02-26 ENCOUNTER — Telehealth: Payer: Self-pay | Admitting: Family Medicine

## 2020-02-26 NOTE — Telephone Encounter (Signed)
It might be a good idea for her to spend some time elsewhere until the pain is done.

## 2020-02-26 NOTE — Telephone Encounter (Signed)
Pt called and states that she is having the inside of her house painted and she wants to know if you think it is ok for there to stay inside since she has COPD pt can be reached at [phone]

## 2020-02-26 NOTE — Telephone Encounter (Signed)
Pt was advised [facility] 

## 2020-03-14 DIAGNOSIS — H401111 Primary open-angle glaucoma, right eye, mild stage: Secondary | ICD-10-CM | POA: Diagnosis not present

## 2020-03-16 ENCOUNTER — Telehealth: Payer: Self-pay

## 2020-03-16 NOTE — Telephone Encounter (Signed)
Pt. Called stating that she needs a letter to be sent to the address of [place] [id number] stating that she needs a mailbox on her front porch and that they need to deliver her mail up to her front porch instead of the mail box at the road due to her health issues and age. Needs to have the word hardship in the attention line. Pt. Stated that you were her physician.

## 2020-03-16 NOTE — Telephone Encounter (Signed)
That is fine.  Please write letter as she requested.  She has severe COPD and it is probably very difficult for her to walk to the mailbox

## 2020-03-17 ENCOUNTER — Encounter: Payer: Self-pay | Admitting: Medical

## 2020-03-17 NOTE — Telephone Encounter (Signed)
I typed the letter for the pt. And it is being mailed to the address she provided.

## 2020-03-24 ENCOUNTER — Telehealth: Payer: Self-pay | Admitting: Family Medicine

## 2020-03-24 MED ORDER — DOXYCYCLINE HYCLATE 100 MG PO TABS: 100.0000 mg | ORAL_TABLET | Freq: Two times a day (BID) | ORAL | 0 refills | Status: DC

## 2020-03-24 NOTE — Telephone Encounter (Signed)
Pt called and said she was prescribed a Zpack and prednisone by Audelia Acton in Jan. But her symptoms haven't cleared up. She is still having coughing and Yellowish mucus and wants to see if she could get something called in to the [facility] at [facility]

## 2020-04-26 ENCOUNTER — Ambulatory Visit (INDEPENDENT_AMBULATORY_CARE_PROVIDER_SITE_OTHER): Payer: Medicare Other | Admitting: Podiatry

## 2020-04-26 ENCOUNTER — Encounter: Payer: Self-pay | Admitting: Podiatry

## 2020-04-26 ENCOUNTER — Other Ambulatory Visit: Payer: Self-pay

## 2020-04-26 VITALS — Temp 97.3°F

## 2020-04-26 DIAGNOSIS — M79676 Pain in unspecified toe(s): Secondary | ICD-10-CM

## 2020-04-26 DIAGNOSIS — B351 Tinea unguium: Secondary | ICD-10-CM | POA: Diagnosis not present

## 2020-04-26 DIAGNOSIS — N289 Disorder of kidney and ureter, unspecified: Secondary | ICD-10-CM | POA: Diagnosis not present

## 2020-04-26 NOTE — Progress Notes (Signed)
This patient returns to my office for at risk foot care.  This patient requires this care by a professional since this patient will be at risk due to having  renal insufficiency.  This patient is unable to cut nails herself since the patient cannot reach her nails.These nails are painful walking and wearing shoes.  This patient presents for at risk foot care today.  General Appearance  Alert, conversant and in no acute stress.  Vascular  Dorsalis pedis and posterior tibial  pulses are weakly  palpable  bilaterally.  Capillary return is within normal limits  bilaterally. Temperature is within normal limits  bilaterally.  Neurologic  Senn-Weinstein monofilament wire test diminished   bilaterally. Muscle power within normal limits bilaterally.  Nails Thick disfigured discolored nails with subungual debris  from hallux to fifth toes bilaterally. No evidence of bacterial infection or drainage bilaterally.  Orthopedic  No limitations of motion  feet .  No crepitus or effusions noted.  No bony pathology or digital deformities noted.  Skin  normotropic skin with no porokeratosis noted bilaterally.  No signs of infections or ulcers noted.     Onychomycosis  Pain in right toes  Pain in left toes  Consent was obtained for treatment procedures.   Mechanical debridement of nails 1-5  bilaterally performed with a nail nipper.  Filed with dremel without incident.    Return office visit   3 months                   Told patient to return for periodic foot care and evaluation due to potential at risk complications.   Ettie Krontz DPM  

## 2020-05-18 ENCOUNTER — Encounter: Payer: Self-pay | Admitting: Family Medicine

## 2020-05-18 ENCOUNTER — Other Ambulatory Visit: Payer: Self-pay

## 2020-05-18 ENCOUNTER — Telehealth (INDEPENDENT_AMBULATORY_CARE_PROVIDER_SITE_OTHER): Payer: Medicare Other | Admitting: Family Medicine

## 2020-05-18 VITALS — Temp 97.0°F | Wt 142.0 lb

## 2020-05-18 DIAGNOSIS — J441 Chronic obstructive pulmonary disease with (acute) exacerbation: Secondary | ICD-10-CM

## 2020-05-18 MED ORDER — DOXYCYCLINE HYCLATE 100 MG PO TABS: 100.0000 mg | ORAL_TABLET | Freq: Two times a day (BID) | ORAL | 0 refills | Status: DC

## 2020-05-18 NOTE — Progress Notes (Signed)
   Subjective:    Patient ID: [name], female    DOB: April 17, 1932, 86 y.o.   MRN: [id number]  HPI  Interactive audio and video telecommunications were attempted between this provider and patient, however she  did not have access to video capability.  We continued and completed visit with audio only. The patient was located at home. The provider was located in the office. The patient did consent to this visit and is aware of possible charges through their insurance for this visit. The other persons participating in this telemedicine service were none. Time spent on call was 2 minutes and in review of previous records >8 minutes total. This virtual service is not related to other E/M service within previous 7 days. She complains of a 4-day history of difficulty with a cough that started out as dry and nonproductive but now has become productive.  She does have underlying COPD.  She continues on Arnuity and is not having to use the albuterol.  She has had difficulties with this in the past and responded well to an antibiotic.   Review of Systems     Objective:   Physical Exam Alert and in no distress.  No tachypnea is noted.       Assessment & Plan:  COPD exacerbation ([place]) - Plan: doxycycline (VIBRA-TABS) 100 MG tablet With her underlying history, an antibiotic at this point would be appropriate. She does have an appointment with pulmonary in the near future.  She will call if she has further difficulty.

## 2020-05-24 ENCOUNTER — Telehealth: Payer: Self-pay | Admitting: Family Medicine

## 2020-05-24 DIAGNOSIS — J441 Chronic obstructive pulmonary disease with (acute) exacerbation: Secondary | ICD-10-CM

## 2020-05-24 MED ORDER — DOXYCYCLINE HYCLATE 100 MG PO TABS: 100.0000 mg | ORAL_TABLET | Freq: Two times a day (BID) | ORAL | 0 refills | Status: DC

## 2020-05-24 NOTE — Telephone Encounter (Signed)
Let her know that I called the medication in. 

## 2020-05-24 NOTE — Telephone Encounter (Signed)
Pt called and said she finished her antibiotic on yesterday but is coughing up mucus still. She feels better but not 100% and wants to see if she can get another round of antibiotics called in

## 2020-06-16 ENCOUNTER — Telehealth: Payer: Self-pay | Admitting: Internal Medicine

## 2020-06-16 NOTE — Telephone Encounter (Signed)
Patient called, left message for her to call our office regarding her inhaler medications.

## 2020-06-16 NOTE — Telephone Encounter (Signed)
Spoke with pt, advised her to take her dose for today and then start back in the morning tomorrow. Pt understood and nothing further is needed.

## 2020-07-15 DIAGNOSIS — Z961 Presence of intraocular lens: Secondary | ICD-10-CM | POA: Diagnosis not present

## 2020-07-15 DIAGNOSIS — H401111 Primary open-angle glaucoma, right eye, mild stage: Secondary | ICD-10-CM | POA: Diagnosis not present

## 2020-07-27 ENCOUNTER — Other Ambulatory Visit: Payer: Self-pay

## 2020-07-27 ENCOUNTER — Ambulatory Visit (INDEPENDENT_AMBULATORY_CARE_PROVIDER_SITE_OTHER): Payer: Medicare Other | Admitting: Podiatry

## 2020-07-27 ENCOUNTER — Encounter: Payer: Self-pay | Admitting: Podiatry

## 2020-07-27 DIAGNOSIS — B351 Tinea unguium: Secondary | ICD-10-CM

## 2020-07-27 DIAGNOSIS — N289 Disorder of kidney and ureter, unspecified: Secondary | ICD-10-CM

## 2020-07-27 DIAGNOSIS — M79676 Pain in unspecified toe(s): Secondary | ICD-10-CM

## 2020-07-27 NOTE — Progress Notes (Signed)
This patient returns to my office for at risk foot care.  This patient requires this care by a professional since this patient will be at risk due to having  renal insufficiency.  This patient is unable to cut nails herself since the patient cannot reach her nails.These nails are painful walking and wearing shoes.  This patient presents for at risk foot care today.  General Appearance  Alert, conversant and in no acute stress.  Vascular  Dorsalis pedis and posterior tibial  pulses are weakly  palpable  bilaterally.  Capillary return is within normal limits  bilaterally. Temperature is within normal limits  bilaterally.  Neurologic  Senn-Weinstein monofilament wire test diminished   bilaterally. Muscle power within normal limits bilaterally.  Nails Thick disfigured discolored nails with subungual debris  from hallux to fifth toes bilaterally. No evidence of bacterial infection or drainage bilaterally.  Orthopedic  No limitations of motion  feet .  No crepitus or effusions noted.  No bony pathology or digital deformities noted.  Skin  normotropic skin with no porokeratosis noted bilaterally.  No signs of infections or ulcers noted.     Onychomycosis  Pain in right toes  Pain in left toes  Consent was obtained for treatment procedures.   Mechanical debridement of nails 1-5  bilaterally performed with a nail nipper.  Filed with dremel without incident.    Return office visit   3 months                   Told patient to return for periodic foot care and evaluation due to potential at risk complications.   Jahnae Mcadoo DPM  

## 2020-08-09 ENCOUNTER — Ambulatory Visit (INDEPENDENT_AMBULATORY_CARE_PROVIDER_SITE_OTHER): Payer: Medicare Other | Admitting: Internal Medicine

## 2020-08-09 ENCOUNTER — Encounter: Payer: Self-pay | Admitting: Internal Medicine

## 2020-08-09 ENCOUNTER — Other Ambulatory Visit: Payer: Self-pay

## 2020-08-09 DIAGNOSIS — J9612 Chronic respiratory failure with hypercapnia: Secondary | ICD-10-CM

## 2020-08-09 DIAGNOSIS — J449 Chronic obstructive pulmonary disease, unspecified: Secondary | ICD-10-CM | POA: Diagnosis not present

## 2020-08-09 NOTE — Progress Notes (Signed)
Subjective:    Patient ID: [name]     DOB: [birth year]     MRN: [id number]  Brief patient profile:  58 yowf quit smoking 02/2013  referred 02/08/2012 by Glade Lloyd for intermittent sob was on 02 but stopped in early 2013 and with GOLD III COPD documented 03/2012     History of Present Illness  11/09/2016  f/u ov/[name] re:   GOLD III / maint perforomist/ bud/Incruse/ prn saba neb  Chief Complaint  Patient presents with  . Follow-up    Pt called on 11/05/16 with c/o cough with yellow sputum- zpack and pred taper given. She is much improved and barely coughing at all at this point. Breathing is at her normal baseline.    baseline doe = MMRC3 = can't walk 100 yards even at a slow pace at a flat grade s stopping due to sob  Even on 02  rec For flares of cough/ wheeze/ short of breath assoc with change in mucus as you have in the past  >>> zpak / Prednisone 10 mg take  4 each am x 2 days,   2 each am x 2 days,  1 each am x 2 days and stop     11/12/2017  f/u ov/[name] re:  GOLD III/ maint  rx - took one cycle zpak /pred since last ov/ using med cal well / 2lpm 24/ 7 continuous, not pulsed Chief Complaint  Patient presents with  . Follow-up    2 liters O2, some sob with activity  MMRC3 = can't walk 100 yards even at a slow pace at a flat grade s stopping due to sob   No need for saba on perf/bud/incruse  rec trelegy     05/14/2019  f/u ov/[name] re: GOLD III spirometry/ 02 dep hs and with activity  Chief Complaint  Patient presents with  . Follow-up    States her breathing has been mostly good. She has good days and bad days. Reports a congested cough. Wants to qualify for innogen.   Dyspnea:  MMRC3 = can't walk 100 yards even at a slow pace at a flat grade s stopping due to sob  - can do mailbox and back flat 50 ft on 02 2lpm  Cough: more rattling x sev days / no color/ not using flutter  Sleeping:  Props up x pillows / bed is flat  SABA use:   02: 2lpm  rec Prednisone 10 mg  take  4 each am x 2 days,   2 each am x 2 days,  1 each am x 2 days and stop  For cough/congestion > mucinex dm up to 1200 mg every 12 hours and use the flutter valve as much as possbile Only use your albuterol as a rescue medication      08/10/2019  f/u ov/[name] re:  GOLD III   spirometry  But  02 dep 2lpm 24/7  maint on trelegy  Chief Complaint  Patient presents with  . Follow-up    Patient reports that she is doing better. She still has sob and cough. She reports not having to use her rescue inhaler.  Dyspnea:  MMRC3 = can't walk 100 yards even at a slow pace at a flat grade s stopping due to sob   Cough: none   Sleeping: props up on 2 pillows  SABA use: none  02: 2lpm 24/7 does not titrate     08/09/2020  f/u ov/[name] re: GOLD III spirometry / 02 dep /  maint trelegy  Chief Complaint  Patient presents with  . Follow-up    pt wants to talk to about booster samples. pt wants samples for inhaler   Dyspnea:  MMRC3 = can't walk 100 yards even at a slow pace at a flat grade s stopping due to sob  = walking room to room  Cough: not much/ slt rattle  Sleeping: bed is flat/ 2 pillows SABA use: none 02: 2lpm 24/ 7 does not titrate    No obvious day to day or daytime variability or assoc excess/ purulent sputum or mucus plugs or hemoptysis or cp or chest tightness, subjective wheeze or overt sinus or hb symptoms.   Sleeping  without nocturnal  or early am exacerbation  of respiratory  c/o's or need for noct saba. Also denies any obvious fluctuation of symptoms with weather or environmental changes or other aggravating or alleviating factors except as outlined above   No unusual exposure hx or h/o childhood pna/ asthma or knowledge of premature birth.  Current Allergies, Complete Past Medical History, Past Surgical History, Family History, and Social History were reviewed in [employer] record.  ROS  The following are not active complaints unless bolded Hoarseness,  sore throat, dysphagia, dental problems, itching, sneezing,  nasal congestion or discharge of excess mucus or purulent secretions, ear ache,   fever, chills, sweats, unintended wt loss or wt gain, classically pleuritic or exertional cp,  orthopnea pnd or arm/hand swelling  or leg swelling, presyncope, palpitations, abdominal pain, anorexia, nausea, vomiting, diarrhea  or change in bowel habits or change in bladder habits, change in stools or change in urine, dysuria, hematuria,  rash, arthralgias, visual complaints, headache, numbness, weakness or ataxia or problems with walking or coordination,  change in mood or  memory.        Current Meds  Medication Sig  . acetaminophen (TYLENOL) 650 MG CR tablet Take 650 mg by mouth every 8 (eight) hours as needed for pain.  Marland Kitchen albuterol (PROVENTIL HFA;VENTOLIN HFA) 108 (90 Base) MCG/ACT inhaler Inhale 2 puffs into the lungs every 4 (four) hours as needed for wheezing or shortness of breath (((PLAN A))). Reported on 01/12/2016  . brimonidine (ALPHAGAN) 0.2 % ophthalmic solution   . Cholecalciferol (VITAMIN D) 50 MCG (2000 UT) CAPS Take 1 capsule by mouth daily.  Marland Kitchen dextromethorphan-guaiFENesin (MUCINEX DM) 30-600 MG per 12 hr tablet Take 1-2 tablets by mouth every 12 (twelve) hours as needed (with flutter for cough, congestion, and thick mucus). Reported on 06/27/2016  . doxycycline (VIBRA-TABS) 100 MG tablet Take 1 tablet (100 mg total) by mouth 2 (two) times daily.  . Fluticasone-Umeclidin-Vilant (TRELEGY ELLIPTA) 100-62.5-25 MCG/INH AEPB Inhale 1 puff into the lungs every morning.  . OXYGEN Use 2L of O2 continuously                  Objective:   Physical Exam  Chronically appear but very pleasant w/c bound elderly wf nad  08/09/2020   135  08/10/2019   137  Wt 97 02/08/2012 > 03/24/2012  99 > 103 05/09/2012 > 06/12/2012  99 >   09/24/2012  96 > 98 01/05/2013 >  106 05/01/2013 > 05/05/2013  99 > 05/15/2013  104 > 06/01/2013 115 > 115 07/07/2013 > 114  07/17/13 > 127  09/18/2013 > 12/01/2013  131 >135 03/04/2014 > 03/12/2014  133 > 05/27/2014  133  > 06/28/2014  133 >131 07/26/2014 >133 09/21/2014 139 >  04/14/2015  138  >  05/08/2016  146 > 05/10/2017    145 >  11/12/2017  134 >  11/03/2018   144 > 05/14/2019  140 >    Vital signs reviewed  08/09/2020  - Note at rest 02 sats  90% on 2 lpm poc     HEENT : pt wearing mask not removed for exam due to covid -19 concerns.    NECK :  without JVD/Nodes/TM/ nl carotid upstrokes bilaterally   LUNGS: no acc muscle use,  Mod barrel  contour chest wall with bilateral  Distant bs s audible wheeze and  without cough on insp or exp maneuvers and mod  Hyperresonant  to  percussion bilaterally     CV:  RRR  no s3 or murmur or increase in P2, and no edema   ABD:  soft and nontender with pos mid insp Hoover's  in the supine position. No bruits or organomegaly appreciated, bowel sounds nl  MS:     ext warm without deformities, calf tenderness, cyanosis or clubbing No obvious joint restrictions   SKIN: warm and dry without lesions    NEURO:  alert, approp, nl sensorium with  no motor or cerebellar deficits apparent.               Assessment & Plan:

## 2020-08-09 NOTE — Patient Instructions (Signed)
You will likely need a booster at 8 months from you prior shot   Please schedule a follow up visit in 6  months but call sooner if needed

## 2020-08-10 ENCOUNTER — Encounter: Payer: Self-pay | Admitting: Internal Medicine

## 2020-08-10 NOTE — Assessment & Plan Note (Signed)
Quit smoking 02/2013   - PFTs 03/24/2012  FEV1  0.91 (43%) ratio 48 and no better p B2,  DLCO 38% corrects to 53%  - changed to perforomist/bud bid 04/2013  -2015 pulmonary rehab  - med calendar 07/17/13 > did not bring 06/28/2014 , .redone 07/26/2014  - 06/28/2014 p extensive coaching HFA effectiveness =    75% (short Ti) - 08/24/14 started IMPACT trial >finished 08/2015  - PFTs 12/13/14  FEV1 0.85 (38%) ratio 45  p 5 % improvement from saba  09/20/2015 Med calendar  - 05/08/2016 changed lama to incruse  - 11/12/2017  After extensive coaching HFA effectiveness =   75% (short Ti) - 11/12/17 > changed to trelegy      Group D in terms of symptom/risk and laba/lama/ICS  therefore appropriate rx at this point >>>  Continue trelegy and prn saba/reviewed   Advise will likely need covid booster @ 8 m p last vaccination when approved/ rec by cdc/fda

## 2020-08-10 NOTE — Assessment & Plan Note (Addendum)
Onset 02 rx 2013 initially just at hs   - ono RA 05/14/12   4 h 33 min < 89%   So ordered 02 2lpm    - HCO3  32 05/10/13    - 01/05/2013   Walked RA x one lap @ 185 stopped due to  88%   - 01/05/2013  Walked 2lpm 2 laps @ 185 ft each stopped due to  90% sob but improved vs baseline   - RA 05/01/2013 sats = 87%  - 05/10/2017 > 02 sats 88% RA > referred for POC eval > preferred continuous flow - 05/14/2019   Walked RA  1 laps @  approx 122ft each @ slow pace  stopped due to sob and desats to 84% corrected on 2lpm and completed lap s desats  rec as of 02/11/2020  2lpm 24/7     Adequate control on present rx, reviewed in detail with pt > no change in rx needed  / remains well compensated though quite inactive / advised walk as much as possible and check sats with highest activity to assure > 90% sats         Each maintenance medication was reviewed in detail including emphasizing most importantly the difference between maintenance and prns and under what circumstances the prns are to be triggered using an action plan format where appropriate.  Total time for H and P, chart review, counseling, teaching device and generating customized AVS unique to this office visit / charting = 12 min

## 2020-08-17 ENCOUNTER — Encounter: Payer: Self-pay | Admitting: Medical

## 2020-08-17 ENCOUNTER — Telehealth (INDEPENDENT_AMBULATORY_CARE_PROVIDER_SITE_OTHER): Payer: Medicare Other | Admitting: Medical

## 2020-08-17 ENCOUNTER — Other Ambulatory Visit: Payer: Self-pay

## 2020-08-17 VITALS — Ht 67.0 in | Wt 135.0 lb

## 2020-08-17 DIAGNOSIS — J9612 Chronic respiratory failure with hypercapnia: Secondary | ICD-10-CM | POA: Diagnosis not present

## 2020-08-17 DIAGNOSIS — J9611 Chronic respiratory failure with hypoxia: Secondary | ICD-10-CM | POA: Insufficient documentation

## 2020-08-17 DIAGNOSIS — J441 Chronic obstructive pulmonary disease with (acute) exacerbation: Secondary | ICD-10-CM | POA: Diagnosis not present

## 2020-08-17 DIAGNOSIS — J449 Chronic obstructive pulmonary disease, unspecified: Secondary | ICD-10-CM

## 2020-08-17 MED ORDER — PREDNISONE 10 MG PO TABS: ORAL_TABLET | ORAL | 0 refills | Status: DC

## 2020-08-17 MED ORDER — DOXYCYCLINE HYCLATE 100 MG PO TABS: 100.0000 mg | ORAL_TABLET | Freq: Two times a day (BID) | ORAL | 0 refills | Status: DC

## 2020-08-17 NOTE — Progress Notes (Signed)
This visit type was conducted due to national recommendations for restrictions regarding the COVID-19 Pandemic (e.g. social distancing) in an effort to limit this patient's exposure and mitigate transmission in our community.  Due to their co-morbid illnesses, this patient is at least at moderate risk for complications without adequate follow up.  This format is felt to be most appropriate for this patient at this time.    Documentation for virtual audio and video telecommunications through [place] encounter:  The patient was located at home. The provider was located in the office. The patient did consent to this visit and is aware of possible charges through their insurance for this visit.  The other persons participating in this telemedicine service were none. Time spent on call was 15 minutes and in review of previous records >20  minutes total.  This virtual service is not related to other E/M service within previous 7 days.  Subjective:  [name] is a 86 y.o. female who presents for cough, COPD  Virtual consult today for COPD flare.  She has a chronic history of respiratory failure, on 2 L of oxygen daily.  She gets flareups of COPD from time to time.  Recently her clear mucus changed to colored mucus.  She is coughing more, feels little bit more short of breath.  She does not have a pulse oximeter.  She denies fever, nausea, vomiting, body aches or chills.  No Covid contacts, has not been out of the house much around people.  No change in taste or smell.  She is using Tylenol and Mucinex for symptoms and her regular medications in general  Otherwise normal state of health  She has been vaccinated against Covid.    Past Medical History:  Diagnosis Date  . Asthma   . COPD (chronic obstructive pulmonary disease) ([place]) 01/2010   Dr. Melvyn Novas;  . Cor pulmonale [facility])   . Former smoker    quit 02/2013  . H/O bone density study 2008   osteoporosis, improved density on  Fosamax at that time  . Hypoxia 01/2013   hospitalization, Bipap therapy, COPD exacerbation  . Osteoporosis     The following portions of the patient's history were reviewed and updated as appropriate: allergies, current medications, past family history, past medical history, past social history, past surgical history and problem list.  ROS as in subjective  Past Medical History:  Diagnosis Date  . Asthma   . COPD (chronic obstructive pulmonary disease) ([place]) 01/2010   Dr. Melvyn Novas;  . Cor pulmonale [facility])   . Former smoker    quit 02/2013  . H/O bone density study 2008   osteoporosis, improved density on Fosamax at that time  . Hypoxia 01/2013   hospitalization, Bipap therapy, COPD exacerbation  . Osteoporosis      Objective: Patient was not examined in the office as this is a virtual consult  Ht 5\' 7"  (1.702 m)   Wt 135 lb (61.2 kg)   BMI 21.14 kg/m   General appearance: Alert, WD/WN, no distress       Assessment: Encounter Diagnoses  Name Primary?  Marland Kitchen COPD exacerbation ([place]) Yes  . COPD GOLD III   . Chronic respiratory failure with hypercapnia ([place])   . Chronic respiratory failure with hypoxia ([facility])      Plan:  She has known COPD and chronic respiratory failure, on 2 L of oxygen around-the-clock.  She has had no recent sick contacts and is staying quarantined for the most part so  likely just a COPD flare.  She can go get a Covid test just in case.  Begin medication as below.  Rest, hydrate well, and if any worsening of symptoms in the next few days call or get evaluated in the emergency department.  We discussed getting a pulse oximeter.  It does not sound that she has 1 of these at home.  It would be valuable for her to keep on hand to monitor her baseline compared to flareups.  Based on pulmonology note she runs about 88% baseline  [name] was seen today for copd and cough.  Diagnoses and all orders for this visit:  COPD exacerbation ([place])  COPD GOLD  III  Chronic respiratory failure with hypercapnia ([facility])  Chronic respiratory failure with hypoxia ([place])  Other orders -     doxycycline (VIBRA-TABS) 100 MG tablet; Take 1 tablet (100 mg total) by mouth 2 (two) times daily. -     predniSONE (DELTASONE) 10 MG tablet; 6/5/4/3/2/1 taper   F/u prn.

## 2020-08-20 ENCOUNTER — Emergency Department (HOSPITAL_COMMUNITY): Payer: Medicare Other

## 2020-08-20 ENCOUNTER — Other Ambulatory Visit: Payer: Self-pay

## 2020-08-20 ENCOUNTER — Inpatient Hospital Stay (HOSPITAL_COMMUNITY)
Admission: EM | Admit: 2020-08-20 | Discharge: 2020-08-25 | DRG: 871 | Disposition: A | Discharge status: Home Health | Payer: Medicare Other | Attending: Internal Medicine | Admitting: Internal Medicine

## 2020-08-20 ENCOUNTER — Encounter (HOSPITAL_COMMUNITY): Payer: Self-pay | Admitting: Student

## 2020-08-20 DIAGNOSIS — Z20822 Contact with and (suspected) exposure to covid-19: Secondary | ICD-10-CM | POA: Diagnosis present

## 2020-08-20 DIAGNOSIS — A419 Sepsis, unspecified organism: Principal | ICD-10-CM | POA: Diagnosis present

## 2020-08-20 DIAGNOSIS — D638 Anemia in other chronic diseases classified elsewhere: Secondary | ICD-10-CM | POA: Diagnosis present

## 2020-08-20 DIAGNOSIS — J440 Chronic obstructive pulmonary disease with acute lower respiratory infection: Secondary | ICD-10-CM | POA: Diagnosis present

## 2020-08-20 DIAGNOSIS — Z823 Family history of stroke: Secondary | ICD-10-CM | POA: Diagnosis not present

## 2020-08-20 DIAGNOSIS — R0602 Shortness of breath: Secondary | ICD-10-CM

## 2020-08-20 DIAGNOSIS — J449 Chronic obstructive pulmonary disease, unspecified: Secondary | ICD-10-CM | POA: Diagnosis not present

## 2020-08-20 DIAGNOSIS — Z833 Family history of diabetes mellitus: Secondary | ICD-10-CM | POA: Diagnosis not present

## 2020-08-20 DIAGNOSIS — Z7951 Long term (current) use of inhaled steroids: Secondary | ICD-10-CM | POA: Diagnosis not present

## 2020-08-20 DIAGNOSIS — J439 Emphysema, unspecified: Secondary | ICD-10-CM | POA: Diagnosis not present

## 2020-08-20 DIAGNOSIS — Z9981 Dependence on supplemental oxygen: Secondary | ICD-10-CM

## 2020-08-20 DIAGNOSIS — Z79899 Other long term (current) drug therapy: Secondary | ICD-10-CM

## 2020-08-20 DIAGNOSIS — J189 Pneumonia, unspecified organism: Secondary | ICD-10-CM

## 2020-08-20 DIAGNOSIS — R54 Age-related physical debility: Secondary | ICD-10-CM | POA: Diagnosis present

## 2020-08-20 DIAGNOSIS — Z87891 Personal history of nicotine dependence: Secondary | ICD-10-CM | POA: Diagnosis not present

## 2020-08-20 DIAGNOSIS — Z881 Allergy status to other antibiotic agents status: Secondary | ICD-10-CM

## 2020-08-20 DIAGNOSIS — J441 Chronic obstructive pulmonary disease with (acute) exacerbation: Secondary | ICD-10-CM | POA: Diagnosis present

## 2020-08-20 DIAGNOSIS — J90 Pleural effusion, not elsewhere classified: Secondary | ICD-10-CM | POA: Diagnosis not present

## 2020-08-20 DIAGNOSIS — Z8261 Family history of arthritis: Secondary | ICD-10-CM | POA: Diagnosis not present

## 2020-08-20 DIAGNOSIS — M810 Age-related osteoporosis without current pathological fracture: Secondary | ICD-10-CM | POA: Diagnosis present

## 2020-08-20 DIAGNOSIS — R000 Tachycardia, unspecified: Secondary | ICD-10-CM | POA: Diagnosis not present

## 2020-08-20 DIAGNOSIS — F419 Anxiety disorder, unspecified: Secondary | ICD-10-CM | POA: Diagnosis present

## 2020-08-20 DIAGNOSIS — Z800 Family history of malignant neoplasm of digestive organs: Secondary | ICD-10-CM

## 2020-08-20 DIAGNOSIS — I2781 Cor pulmonale (chronic): Secondary | ICD-10-CM | POA: Diagnosis present

## 2020-08-20 DIAGNOSIS — J9621 Acute and chronic respiratory failure with hypoxia: Secondary | ICD-10-CM | POA: Diagnosis present

## 2020-08-20 DIAGNOSIS — I517 Cardiomegaly: Secondary | ICD-10-CM | POA: Diagnosis not present

## 2020-08-20 DIAGNOSIS — R05 Cough: Secondary | ICD-10-CM | POA: Diagnosis not present

## 2020-08-20 LAB — APTT: aPTT: 39 seconds — ABNORMAL HIGH (ref 24–36)

## 2020-08-20 LAB — COMPREHENSIVE METABOLIC PANEL
ALT: 47 U/L — ABNORMAL HIGH (ref 0–44)
ALT: 39 U/L (ref 0–44)
AST: 58 U/L — ABNORMAL HIGH (ref 15–41)
AST: 45 U/L — ABNORMAL HIGH (ref 15–41)
Albumin: 3.3 g/dL — ABNORMAL LOW (ref 3.5–5.0)
Albumin: 3 g/dL — ABNORMAL LOW (ref 3.5–5.0)
Alkaline Phosphatase: 85 U/L (ref 38–126)
Alkaline Phosphatase: 82 U/L (ref 38–126)
Anion gap: 11 (ref 5–15)
Anion gap: 11 (ref 5–15)
BUN: 18 mg/dL (ref 8–23)
BUN: 17 mg/dL (ref 8–23)
CO2: 27 mmol/L (ref 22–32)
CO2: 27 mmol/L (ref 22–32)
Calcium: 8.9 mg/dL (ref 8.9–10.3)
Calcium: 8.6 mg/dL — ABNORMAL LOW (ref 8.9–10.3)
Chloride: 101 mmol/L (ref 98–111)
Chloride: 101 mmol/L (ref 98–111)
Creatinine, Ser: 0.69 mg/dL (ref 0.44–1.00)
Creatinine, Ser: 0.73 mg/dL (ref 0.44–1.00)
GFR calc Af Amer: >60 mL/min (ref >60)
GFR calc Af Amer: >60 mL/min (ref >60)
GFR calc non Af Amer: >60 mL/min (ref >60)
GFR calc non Af Amer: >60 mL/min (ref >60)
Glucose, Bld: 129 mg/dL — ABNORMAL HIGH (ref 70–99)
Glucose, Bld: 109 mg/dL — ABNORMAL HIGH (ref 70–99)
Potassium: 4.2 mmol/L (ref 3.5–5.1)
Potassium: 3.8 mmol/L (ref 3.5–5.1)
Sodium: 139 mmol/L (ref 135–145)
Sodium: 139 mmol/L (ref 135–145)
Total Bilirubin: 0.8 mg/dL (ref 0.3–1.2)
Total Bilirubin: 0.7 mg/dL (ref 0.3–1.2)
Total Protein: 7.6 g/dL (ref 6.5–8.1)
Total Protein: 7 g/dL (ref 6.5–8.1)

## 2020-08-20 LAB — CBC WITH DIFFERENTIAL/PLATELET
Abs Immature Granulocytes: 0.18 10*3/uL — ABNORMAL HIGH (ref 0.00–0.07)
Abs Immature Granulocytes: 0.11 10*3/uL — ABNORMAL HIGH (ref 0.00–0.07)
Basophils Absolute: 0 10*3/uL (ref 0.0–0.1)
Basophils Absolute: 0 10*3/uL (ref 0.0–0.1)
Basophils Relative: 0 %
Basophils Relative: 0 %
Eosinophils Absolute: 0 10*3/uL (ref 0.0–0.5)
Eosinophils Absolute: 0 10*3/uL (ref 0.0–0.5)
Eosinophils Relative: 0 %
Eosinophils Relative: 0 %
HCT: 37.1 % (ref 36.0–46.0)
HCT: 35.6 % — ABNORMAL LOW (ref 36.0–46.0)
Hemoglobin: 11.8 g/dL — ABNORMAL LOW (ref 12.0–15.0)
Hemoglobin: 11.3 g/dL — ABNORMAL LOW (ref 12.0–15.0)
Immature Granulocytes: 1 %
Immature Granulocytes: 1 %
Lymphocytes Relative: 5 %
Lymphocytes Relative: 7 %
Lymphs Abs: 0.9 10*3/uL (ref 0.7–4.0)
Lymphs Abs: 1.1 10*3/uL (ref 0.7–4.0)
MCH: 29 pg (ref 26.0–34.0)
MCH: 29.4 pg (ref 26.0–34.0)
MCHC: 31.8 g/dL (ref 30.0–36.0)
MCHC: 31.7 g/dL (ref 30.0–36.0)
MCV: 91.2 fL (ref 80.0–100.0)
MCV: 92.5 fL (ref 80.0–100.0)
Monocytes Absolute: 2.2 10*3/uL — ABNORMAL HIGH (ref 0.1–1.0)
Monocytes Absolute: 1.9 10*3/uL — ABNORMAL HIGH (ref 0.1–1.0)
Monocytes Relative: 11 %
Monocytes Relative: 12 %
Neutro Abs: 16.5 10*3/uL — ABNORMAL HIGH (ref 1.7–7.7)
Neutro Abs: 12.3 10*3/uL — ABNORMAL HIGH (ref 1.7–7.7)
Neutrophils Relative %: 83 %
Neutrophils Relative %: 80 %
Platelets: 316 10*3/uL (ref 150–400)
Platelets: 320 10*3/uL (ref 150–400)
RBC: 4.07 MIL/uL (ref 3.87–5.11)
RBC: 3.85 MIL/uL — ABNORMAL LOW (ref 3.87–5.11)
RDW: 15.8 % — ABNORMAL HIGH (ref 11.5–15.5)
RDW: 15.9 % — ABNORMAL HIGH (ref 11.5–15.5)
WBC: 19.8 10*3/uL — ABNORMAL HIGH (ref 4.0–10.5)
WBC: 15.4 10*3/uL — ABNORMAL HIGH (ref 4.0–10.5)
nRBC: 0 % (ref 0.0–0.2)
nRBC: 0 % (ref 0.0–0.2)

## 2020-08-20 LAB — PROTIME-INR
INR: 1.2 (ref 0.8–1.2)
Prothrombin Time: 14.4 seconds (ref 11.4–15.2)

## 2020-08-20 LAB — SARS CORONAVIRUS 2 BY RT PCR (HOSPITAL ORDER, PERFORMED IN ~~LOC~~ HOSPITAL LAB): SARS Coronavirus 2: NEGATIVE

## 2020-08-20 LAB — D-DIMER, QUANTITATIVE: D-Dimer, Quant: 1.44 ug/mL-FEU — ABNORMAL HIGH (ref 0.00–0.50)

## 2020-08-20 LAB — LACTIC ACID, PLASMA
Lactic Acid, Venous: 1.1 mmol/L (ref 0.5–1.9)
Lactic Acid, Venous: 2.3 mmol/L (ref 0.5–1.9)

## 2020-08-20 MED ORDER — SODIUM CHLORIDE (PF) 0.9 % IJ SOLN
INTRAMUSCULAR | Status: AC
  Filled 2020-08-20: qty 50

## 2020-08-20 MED ORDER — ENOXAPARIN SODIUM 40 MG/0.4ML ~~LOC~~ SOLN
40.0000 mg | SUBCUTANEOUS | Status: DC
  Administered 2020-08-20: 40 mg via SUBCUTANEOUS
  Filled 2020-08-20: qty 0.4

## 2020-08-20 MED ORDER — LACTATED RINGERS IV BOLUS (SEPSIS)
1000.0000 mL | Freq: Once | INTRAVENOUS | Status: AC
  Administered 2020-08-20: 1000 mL via INTRAVENOUS

## 2020-08-20 MED ORDER — ACETAMINOPHEN 650 MG RE SUPP: 650.0000 mg | Freq: Four times a day (QID) | RECTAL | Status: DC | PRN

## 2020-08-20 MED ORDER — SODIUM CHLORIDE 0.9 % IV SOLN
500.0000 mg | INTRAVENOUS | Status: DC
  Administered 2020-08-20 – 2020-08-22 (×3): 500 mg via INTRAVENOUS
  Filled 2020-08-20 (×4): qty 500

## 2020-08-20 MED ORDER — SODIUM CHLORIDE 0.9 % IV SOLN
2.0000 g | INTRAVENOUS | Status: DC
  Administered 2020-08-20 – 2020-08-22 (×3): 2 g via INTRAVENOUS
  Filled 2020-08-20 (×3): qty 2
  Filled 2020-08-20: qty 20

## 2020-08-20 MED ORDER — SODIUM CHLORIDE 0.9 % IV SOLN
500.0000 mg | Freq: Once | INTRAVENOUS | Status: DC
  Filled 2020-08-20: qty 500

## 2020-08-20 MED ORDER — IPRATROPIUM-ALBUTEROL 0.5-2.5 (3) MG/3ML IN SOLN: 3.0000 mL | Freq: Once | RESPIRATORY_TRACT | Status: DC

## 2020-08-20 MED ORDER — IOHEXOL 350 MG/ML SOLN
80.0000 mL | Freq: Once | INTRAVENOUS | Status: AC | PRN
  Administered 2020-08-20: 80 mL via INTRAVENOUS

## 2020-08-20 MED ORDER — BRIMONIDINE TARTRATE 0.2 % OP SOLN
1.0000 [drp] | Freq: Three times a day (TID) | OPHTHALMIC | Status: DC
  Administered 2020-08-20 – 2020-08-25 (×14): 1 [drp] via OPHTHALMIC
  Filled 2020-08-20: qty 5

## 2020-08-20 MED ORDER — SODIUM CHLORIDE 0.9 % IV SOLN
1.0000 g | Freq: Once | INTRAVENOUS | Status: DC
  Filled 2020-08-20: qty 10

## 2020-08-20 MED ORDER — ACETAMINOPHEN 325 MG PO TABS
650.0000 mg | ORAL_TABLET | Freq: Four times a day (QID) | ORAL | Status: DC | PRN
  Administered 2020-08-21 – 2020-08-25 (×4): 650 mg via ORAL
  Filled 2020-08-20 (×4): qty 2

## 2020-08-20 NOTE — ED Provider Notes (Signed)
[facility] DEPT Provider Note   CSN: [id number] Arrival date & time: 08/20/20  1034     History Chief Complaint  Patient presents with  . Shortness of Breath    [name] is a 86 y.o. female with a history of COPD, chronic respiratory failure on 2L at baseline, & cor pulmonale who presents to the ED with complaints of dyspnea x 5 days. Patient reports she has felt constantly short of breath, worse with exertion, no alleviating factors. Has had associated cough that is mostly dry, had 1 episode of yellow frothy sputum production, as well as fatigue & wheezing. She has R back pain with coughing episodes otherwise no pain. She is desaturating into the 80s on her baseline 2L oxygen requirement per her daughter. She called her PCP day of onset and was started on doxycycline & a prednisone taper, taking without relief, starting to feel worse. Denies fever, chills, chest pain, syncope, leg pain/swelling, hemoptysis, recent surgery/trauma, recent long travel, hormone use, personal hx of cancer, or hx of DVT/PE. She is fully vaccinated.   HPI     Past Medical History:  Diagnosis Date  . Asthma   . COPD (chronic obstructive pulmonary disease) ([place]) 01/2010   Dr. Melvyn Novas;  . Cor pulmonale [facility])   . Former smoker    quit 02/2013  . H/O bone density study 2008   osteoporosis, improved density on Fosamax at that time  . Hypoxia 01/2013   hospitalization, Bipap therapy, COPD exacerbation  . Osteoporosis     Patient Active Problem List   Diagnosis Date Noted  . Chronic respiratory failure with hypoxia ([place]) 08/17/2020  . Encounter for medication review and counseling 05/02/2018  . Sciatica, left side 05/22/2017  . Buttock pain 05/22/2017  . Foreign body in ear, right, initial encounter 01/30/2017  . Impacted cerumen of both ears 01/30/2017  . Research study patient 08/09/2014  . COPD exacerbation ([place]) 02/28/2013  . Renal insufficiency 02/28/2013  .  Abnormal CXR 07/07/2012  . Chronic respiratory failure with hypercapnia ([place]) 01/21/2012  . Former smoker 01/16/2010  . COPD GOLD III 01/16/2010    Past Surgical History:  Procedure Laterality Date  . CHOLECYSTECTOMY  1970's  . COLONOSCOPY  01/22/07   severe diverticulosis, external hemorrhoids, Dr. Collene Mares; advised repeat 2013     OB History   No obstetric history on file.     Family History  Problem Relation Age of Onset  . Diabetes Mother   . Colon cancer Mother   . Arthritis Father   . Stroke Father     Social History   Tobacco Use  . Smoking status: Former Smoker    Packs/day: 0.30    Years: 50.00    Pack years: 15.00    Types: Cigarettes    Quit date: 03/05/2013    Years since quitting: 7.4  . Smokeless tobacco: Never Used  Substance Use Topics  . Alcohol use: No  . Drug use: No    Home Medications Prior to Admission medications   Medication Sig Start Date End Date Taking? Authorizing Provider  acetaminophen (TYLENOL) 650 MG CR tablet Take 650 mg by mouth every 8 (eight) hours as needed for pain.    [provider]  albuterol (PROVENTIL HFA;VENTOLIN HFA) 108 (90 Base) MCG/ACT inhaler Inhale 2 puffs into the lungs every 4 (four) hours as needed for wheezing or shortness of breath (((PLAN A))). Reported on 01/12/2016 04/18/16   Tanda Rockers, MD  brimonidine [facility])  0.2 % ophthalmic solution  07/29/18   [provider]  Cholecalciferol (VITAMIN D) 50 MCG (2000 UT) CAPS Take 1 capsule by mouth daily.    [provider]  dextromethorphan-guaiFENesin (MUCINEX DM) 30-600 MG per 12 hr tablet Take 1-2 tablets by mouth every 12 (twelve) hours as needed (with flutter for cough, congestion, and thick mucus). Reported on 06/27/2016    [provider]  doxycycline (VIBRA-TABS) 100 MG tablet Take 1 tablet (100 mg total) by mouth 2 (two) times daily. Patient not taking: Reported on 08/17/2020 05/24/20   Denita Lung, MD  doxycycline  (VIBRA-TABS) 100 MG tablet Take 1 tablet (100 mg total) by mouth 2 (two) times daily. 08/17/20   Tysinger, Camelia Eng, PA-C  Fluticasone-Umeclidin-Vilant (TRELEGY ELLIPTA) 100-62.5-25 MCG/INH AEPB Inhale 1 puff into the lungs every morning. 11/20/19   Margaretha Seeds, MD  OXYGEN Use 2L of O2 continuously    [provider]  predniSONE (DELTASONE) 10 MG tablet 6/5/4/3/2/1 taper 08/17/20   Tysinger, Camelia Eng, PA-C    Allergies    Augmentin [amoxicillin-pot clavulanate] and Levofloxacin  Review of Systems   Review of Systems  Constitutional: Negative for chills and fever.  Respiratory: Positive for cough, shortness of breath and wheezing.   Cardiovascular: Negative for chest pain and leg swelling.  Gastrointestinal: Negative for abdominal pain and vomiting.  Musculoskeletal: Positive for back pain.  Neurological: Negative for syncope.  All other systems reviewed and are negative.   Physical Exam Updated Vital Signs BP (!) 172/75   Pulse (!) 111   Temp 98.5 F (36.9 C) (Oral)   Resp 20   Ht 5\' 7"  (1.702 m)   Wt 61.2 kg   SpO2 98%   BMI 21.14 kg/m   Physical Exam Vitals and nursing note reviewed.  Constitutional:      General: She is not in acute distress.    Appearance: She is well-developed. She is not toxic-appearing.  HENT:     Head: Normocephalic and atraumatic.  Eyes:     General:        Right eye: No discharge.        Left eye: No discharge.     Conjunctiva/sclera: Conjunctivae normal.  Cardiovascular:     Rate and Rhythm: Normal rate and regular rhythm.  Pulmonary:     Effort: No respiratory distress.     Breath sounds: No wheezing, rhonchi or rales.     Comments: Somewhat poor air movement.  R>L Chest:     Chest wall: Tenderness (right posterior mid chest wall) present.  Abdominal:     General: There is no distension.     Palpations: Abdomen is soft.     Tenderness: There is no abdominal tenderness.  Musculoskeletal:     Cervical back: Neck supple.       Right lower leg: No tenderness. No edema.     Left lower leg: No tenderness. No edema.  Skin:    General: Skin is warm and dry.     Findings: No rash.  Neurological:     Mental Status: She is alert.     Comments: Clear speech.   Psychiatric:        Behavior: Behavior normal.     ED Results / Procedures / Treatments   Labs (all labs ordered are listed, but only abnormal results are displayed) Labs Reviewed  COMPREHENSIVE METABOLIC PANEL - Abnormal; Notable for the following components:      Result Value   Glucose, Bld  129 (*)    Albumin 3.3 (*)    AST 58 (*)    ALT 47 (*)    All other components within normal limits  CBC WITH DIFFERENTIAL/PLATELET - Abnormal; Notable for the following components:   WBC 19.8 (*)    Hemoglobin 11.8 (*)    RDW 15.8 (*)    Neutro Abs 16.5 (*)    Monocytes Absolute 2.2 (*)    Abs Immature Granulocytes 0.18 (*)    All other components within normal limits  D-DIMER, QUANTITATIVE (NOT AT [facility]) - Abnormal; Notable for the following components:   D-Dimer, Quant 1.44 (*)    All other components within normal limits  SARS CORONAVIRUS 2 BY RT PCR [facility] ORDER, [place] [facility])    EKG EKG Interpretation  Date/Time:  Saturday August 20 2020 10:56:44 EDT Ventricular Rate:  123 PR Interval:    QRS Duration: 95 QT Interval:  333 QTC Calculation: 479 R Axis:   78 Text Interpretation: Sinus tachycardia Artifact Baseline wander Abnormal ECG Confirmed by Carmin Muskrat [phone]) on 08/20/2020 1:14:01 PM   Radiology DG Chest 2 View  Result Date: 08/20/2020 CLINICAL DATA:  COPD.  Cough and lung pain. EXAM: CHEST - 2 VIEW COMPARISON:  05/08/2016 FINDINGS: Normal cardiomediastinal contours. Aortic atherosclerotic calcifications noted. The lungs are hyperinflated and there are coarsened interstitial changes compatible with emphysema. Dense airspace consolidation is identified within the posterior right upper lobe. A second  opacity is identified within the posterior right lung base. Left lung clear. IMPRESSION: 1. Right upper and lower lobe pneumonia. Followup PA and lateral chest X-ray is recommended in 3-4 weeks following trial of antibiotic therapy to ensure resolution and exclude underlying malignancy. 2. Emphysema. Electronically Signed   By: Kerby Moors M.D.   On: 08/20/2020 11:47    Procedures Procedures (including critical care time)  Medications Ordered in ED Medications - No data to display  ED Course  I have reviewed the triage vital signs and the nursing notes.  Pertinent labs & imaging results that were available during my care of the patient were reviewed by me and considered in my medical decision making (see chart for details).    [name] was evaluated in Emergency Department on 08/20/2020 for the symptoms described in the history of present illness. He/she was evaluated in the context of the global COVID-19 pandemic, which necessitated consideration that the patient might be at risk for infection with the SARS-CoV-2 virus that causes COVID-19. Institutional protocols and algorithms that pertain to the evaluation of patients at risk for COVID-19 are in a state of rapid change based on information released by regulatory bodies including the CDC and federal and state organizations. These policies and algorithms were followed during the patient's care in the ED.  MDM Rules/Calculators/A&P                         Patient presents to the ED with complaints of dyspnea.  On arrival she is mildly tachypneic and hypoxic on her baseline oxygen requirement of 2 L, currently on 3 L with saturations improved.  DDx: Community-acquired pneumonia, pulmonary embolism, COVID-19, CHF, COPD exacerbation.  Additional history obtained:  Additional history obtained from her daughter. Previous records obtained and reviewed.  EKG: No STEMI.  Lab Tests:  I Ordered, reviewed, and interpreted labs, which  included:  CBC: Leukocytosis at 19.8 with left shift.  Anemia noted slightly decreased from prior.  CMP: Elevated LFTs (  mild). No significant electrolyte derangement.  D-dimer: Elevated COVID: Negative  Imaging Studies ordered:  Chest x-ray was ordered per triage and shows a right upper and lower lobe pneumonia, I independently visualized and interpreted imaging & am in agreement with radiologist read.  --> CTA with findings of pneumonia, no PE.   Start IV abx for pneumonia.  Patient without significant wheezing on initial assessment, will trial DuoNeb to help with her shortness of breath.  Will discuss w/ hospitalist service for admission.   15:38: CONSULT: Discussed with hospitalist Dr. Wyline Copas- accepts admission.   Findings and plan of care discussed with supervising physician Dr. Vanita Panda who has evaluated the patient & is in agreement.   Portions of this note were generated with Dragon died antrictation software. Dictation errors may occur despite best attempts at proofreading.  Final Clinical Impression(s) / ED Diagnoses Final diagnoses:  Multifocal pneumonia    Rx / DC Orders ED Discharge Orders    None       Amaryllis Dyke, PA-C 08/20/20 1607    Carmin Muskrat, MD 08/21/20 1649

## 2020-08-20 NOTE — H&P (Signed)
History and Physical    [name] DSK:[id number] DOB: [birth year] DOA: 08/20/2020  PCP: Denita Lung, MD  Patient coming from: Home  Chief Complaint: SOB  HPI: [name] is a 86 y.o. female with medical history significant of COPD, prior tobacco abuse who presents to the hospital with worsening shortness of breath and cough.  Patient was recently diagnosed with pneumonia and treated with oral antibiotics and given a course of steroid taper.  Symptoms did not improve and patient subsequently presented to the emergency department for further work-up.  ED Course: Emergency department, patient was noted to be tachycardic with tachypnea with respiratory rate as high as 20 to 30 breaths/min.  Chest x-ray was obtained and reviewed with findings notable for right upper and lower lobe pneumonia.  White blood count was noted to be just under 20,000.  Covid test was negative.  Patient was started on empiric broad-spectrum antibiotics in the emergency department.  Concerns for sepsis with pneumonia and failure of outpatient antibiotics, hospital service was consulted for consideration for medical admission.  Review of Systems:  Review of Systems  Constitutional: Negative for malaise/fatigue and weight loss.  HENT: Negative for congestion, ear discharge and ear pain.   Eyes: Negative for photophobia, pain and discharge.  Respiratory: Positive for cough and shortness of breath.   Cardiovascular: Negative for palpitations, orthopnea and claudication.  Gastrointestinal: Negative for abdominal pain and diarrhea.  Genitourinary: Negative for frequency and hematuria.  Musculoskeletal: Negative for joint pain and neck pain.  Neurological: Negative for speech change, focal weakness and loss of consciousness.  Psychiatric/Behavioral: Negative for hallucinations, memory loss and substance abuse.    Past Medical History:  Diagnosis Date  . Asthma   . COPD (chronic obstructive pulmonary  disease) ([place]) 01/2010   Dr. Melvyn Novas;  . Cor pulmonale [facility])   . Former smoker    quit 02/2013  . H/O bone density study 2008   osteoporosis, improved density on Fosamax at that time  . Hypoxia 01/2013   hospitalization, Bipap therapy, COPD exacerbation  . Osteoporosis     Past Surgical History:  Procedure Laterality Date  . CHOLECYSTECTOMY  1970's  . COLONOSCOPY  01/22/07   severe diverticulosis, external hemorrhoids, Dr. Collene Mares; advised repeat 2013     reports that she quit smoking about 7 years ago. Her smoking use included cigarettes. She has a 15.00 pack-year smoking history. She has never used smokeless tobacco. She reports that she does not drink alcohol and does not use drugs.  Allergies  Allergen Reactions  . Augmentin [Amoxicillin-Pot Clavulanate] Nausea And Vomiting  . Levofloxacin Other (See Comments)    Lowered blood pressre    Family History  Problem Relation Age of Onset  . Diabetes Mother   . Colon cancer Mother   . Arthritis Father   . Stroke Father     Prior to Admission medications   Medication Sig Start Date End Date Taking? Authorizing Provider  acetaminophen (TYLENOL) 650 MG CR tablet Take 650 mg by mouth every 8 (eight) hours as needed for pain.   Yes [provider]  brimonidine (ALPHAGAN) 0.2 % ophthalmic solution Place 1 drop into the right eye 3 (three) times daily.  07/29/18  Yes [provider]  Cholecalciferol (VITAMIN D) 50 MCG (2000 UT) CAPS Take 1 capsule by mouth daily.   Yes [provider]  dextromethorphan-guaiFENesin (MUCINEX DM) 30-600 MG per 12 hr tablet Take 1 tablet by mouth at bedtime as needed (with flutter for  cough, congestion, and thick mucus).    Yes [provider]  doxycycline (VIBRA-TABS) 100 MG tablet Take 1 tablet (100 mg total) by mouth 2 (two) times daily. 08/17/20  Yes Tysinger, Camelia Eng, PA-C  Fluticasone-Umeclidin-Vilant (TRELEGY ELLIPTA) 100-62.5-25 MCG/INH AEPB Inhale 1 puff into the lungs  every morning. 11/20/19  Yes Margaretha Seeds, MD  predniSONE (DELTASONE) 10 MG tablet 6/5/4/3/2/1 taper Patient taking differently: Take 10 mg by mouth See admin instructions. 6/5/4/3/2/1 taper 6 tablets day 1, 5 tablets on day 2, 4 tablets on day 3, 3 tablets on day 4, 2 tablets on day 5, 1 tablet on day 6 08/17/20  Yes Tysinger, Camelia Eng, PA-C  albuterol (PROVENTIL HFA;VENTOLIN HFA) 108 (90 Base) MCG/ACT inhaler Inhale 2 puffs into the lungs every 4 (four) hours as needed for wheezing or shortness of breath (((PLAN A))). Reported on 01/12/2016 Patient not taking: Reported on 08/20/2020 04/18/16   Tanda Rockers, MD  OXYGEN Use 2L of O2 continuously    [provider]    Physical Exam: Vitals:   08/20/20 1359 08/20/20 1443 08/20/20 1559 08/20/20 1607  BP: 138/74 (!) 143/80 (!) 142/78 (!) 151/86  Pulse: 99 (!) 103 (!) 103 (!) 103  Resp: (!) 32 (!) 28 (!) 23   Temp:    98.6 F (37 C)  TempSrc:    Oral  SpO2: 92% 94% 95% 94%  Weight:      Height:        Constitutional: NAD, calm, comfortable Vitals:   08/20/20 1359 08/20/20 1443 08/20/20 1559 08/20/20 1607  BP: 138/74 (!) 143/80 (!) 142/78 (!) 151/86  Pulse: 99 (!) 103 (!) 103 (!) 103  Resp: (!) 32 (!) 28 (!) 23   Temp:    98.6 F (37 C)  TempSrc:    Oral  SpO2: 92% 94% 95% 94%  Weight:      Height:       Eyes: PERRL, lids and conjunctivae normal ENMT: External ears appear normal, head atraumatic Neck: normal, supple, no masses, no thyromegaly Respiratory: normal resp effort, no audible wheezing Cardiovascular: Regular rate and rhythm, s1, s2 Abdomen: no tenderness, nondistended  Musculoskeletal: no clubbing / cyanosis. No joint deformity upper and lower extremities. Good ROM, no contractures. Normal muscle tone.  Skin: no rashes, lesions Neurologic: CN 2-12 grossly intact. Sensation intact, no tremors Psychiatric: Normal judgment and insight. Alert and oriented x 3. Normal mood.    Labs on Admission: I have  personally reviewed following labs and imaging studies  CBC: Recent Labs  Lab 08/20/20 1300  WBC 19.8*  NEUTROABS 16.5*  HGB 11.8*  HCT 37.1  MCV 91.2  PLT 174   Basic Metabolic Panel: Recent Labs  Lab 08/20/20 1300  NA 139  K 4.2  CL 101  CO2 27  GLUCOSE 129*  BUN 18  CREATININE 0.69  CALCIUM 8.9   GFR: Estimated Creatinine Clearance: 49.7 mL/min (by C-G formula based on SCr of 0.69 mg/dL). Liver Function Tests: Recent Labs  Lab 08/20/20 1300  AST 58*  ALT 47*  ALKPHOS 85  BILITOT 0.8  PROT 7.6  ALBUMIN 3.3*   No results for input(s): LIPASE, AMYLASE in the last 168 hours. No results for input(s): AMMONIA in the last 168 hours. Coagulation Profile: No results for input(s): INR, PROTIME in the last 168 hours. Cardiac Enzymes: No results for input(s): CKTOTAL, CKMB, CKMBINDEX, TROPONINI in the last 168 hours. BNP (last 3 results) No results for input(s): PROBNP in the last  8760 hours. HbA1C: No results for input(s): HGBA1C in the last 72 hours. CBG: No results for input(s): GLUCAP in the last 168 hours. Lipid Profile: No results for input(s): CHOL, HDL, LDLCALC, TRIG, CHOLHDL, LDLDIRECT in the last 72 hours. Thyroid Function Tests: No results for input(s): TSH, T4TOTAL, FREET4, T3FREE, THYROIDAB in the last 72 hours. Anemia Panel: No results for input(s): VITAMINB12, FOLATE, FERRITIN, TIBC, IRON, RETICCTPCT in the last 72 hours. Urine analysis:    Component Value Date/Time   BILIRUBINUR neg 12/22/2012 1409   PROTEINUR small 12/22/2012 1409   UROBILINOGEN negative 12/22/2012 1409   NITRITE neg 12/22/2012 1409   LEUKOCYTESUR Negative 12/22/2012 1409   Sepsis Labs: !!!!!!!!!!!!!!!!!!!!!!!!!!!!!!!!!!!!!!!!!!!! @LABRCNTIP (procalcitonin:4,lacticidven:4) ) Recent Results (from the past 240 hour(s))  SARS Coronavirus 2 by RT PCR (hospital order, performed in [place] hospital lab) Nasopharyngeal Nasopharyngeal Swab     Status: None   Collection Time:  08/20/20  1:00 PM   Specimen: Nasopharyngeal Swab  Result Value Ref Range Status   SARS Coronavirus 2 NEGATIVE NEGATIVE Final    Comment: (NOTE) SARS-CoV-2 target nucleic acids are NOT DETECTED.  The SARS-CoV-2 RNA is generally detectable in upper and lower respiratory specimens during the acute phase of infection. The lowest concentration of SARS-CoV-2 viral copies this assay can detect is 250 copies / mL. A negative result does not preclude SARS-CoV-2 infection and should not be used as the sole basis for treatment or other patient management decisions.  A negative result may occur with improper specimen collection / handling, submission of specimen other than nasopharyngeal swab, presence of viral mutation(s) within the areas targeted by this assay, and inadequate number of viral copies (<250 copies / mL). A negative result must be combined with clinical observations, patient history, and epidemiological information.  Fact Sheet for Patients:   StrictlyIdeas.no  Fact Sheet for Healthcare Providers: BankingDealers.co.za  This test is not yet approved or  cleared by the [place] FDA and has been authorized for detection and/or diagnosis of SARS-CoV-2 by FDA under an Emergency Use Authorization (EUA).  This EUA will remain in effect (meaning this test can be used) for the duration of the COVID-19 declaration under Section 564(b)(1) of the Act, 21 U.S.C. section 360bbb-3(b)(1), unless the authorization is terminated or revoked sooner.  Performed at [facility], [place] [address] [place], [place] [address]      Radiological Exams on Admission: DG Chest 2 View  Result Date: 08/20/2020 CLINICAL DATA:  COPD.  Cough and lung pain. EXAM: CHEST - 2 VIEW COMPARISON:  05/08/2016 FINDINGS: Normal cardiomediastinal contours. Aortic atherosclerotic calcifications noted. The lungs are hyperinflated and there are coarsened  interstitial changes compatible with emphysema. Dense airspace consolidation is identified within the posterior right upper lobe. A second opacity is identified within the posterior right lung base. Left lung clear. IMPRESSION: 1. Right upper and lower lobe pneumonia. Followup PA and lateral chest X-ray is recommended in 3-4 weeks following trial of antibiotic therapy to ensure resolution and exclude underlying malignancy. 2. Emphysema. Electronically Signed   By: Kerby Moors M.D.   On: 08/20/2020 11:47   CT Angio Chest PE W/Cm &/Or Wo Cm  Result Date: 08/20/2020 CLINICAL DATA:  Right-sided chest pain. Shortness of breath. History of COPD. EXAM: CT ANGIOGRAPHY CHEST WITH CONTRAST TECHNIQUE: Multidetector CT imaging of the chest was performed using the standard protocol during bolus administration of intravenous contrast. Multiplanar CT image reconstructions and MIPs were obtained to evaluate the vascular anatomy. CONTRAST:  66mL OMNIPAQUE  IOHEXOL 350 MG/ML SOLN COMPARISON:  Chest x-ray from same day. CTA chest dated May 06, 2013. FINDINGS: Cardiovascular: Satisfactory opacification of the pulmonary arteries to the segmental level. No evidence of pulmonary embolism. Unchanged dilated main and right pulmonary arteries measuring up to 3.9 cm diameter. Normal heart size. No pericardial effusion. No thoracic aortic aneurysm. Coronary, aortic arch, and branch vessel atherosclerotic vascular disease. Mediastinum/Nodes: Mildly enlarged right hilar lymph node measuring 1.3 cm in short axis (series 5, image 76), likely reactive. No additional enlarged mediastinal, hilar, or axillary lymph nodes. The thyroid gland, trachea, and esophagus demonstrate no significant findings. Lungs/Pleura: Severe centrilobular emphysema. Patchy consolidation in the right upper lobe and to a lesser extent the right lower lobe. Small right pleural effusion. Chronic mucous plugging in the right lower lobe posterior basal segment. Unchanged 2  mm nodule in the left upper lobe. Linear scarring in the left lower lobe. Upper Abdomen: No acute abnormality.  Small hiatal hernia. Musculoskeletal: No chest wall abnormality. No acute or significant osseous findings. Review of the MIP images confirms the above findings. IMPRESSION: 1. No evidence of pulmonary embolism. 2. Patchy consolidation in the right upper lobe and to a lesser extent the right lower lobe, consistent with multifocal pneumonia. 3. Small right pleural effusion. 4. Unchanged dilated main and right pulmonary arteries, consistent with pulmonary arterial hypertension. 5. Aortic Atherosclerosis (ICD10-I70.0) and Emphysema (ICD10-J43.9). Electronically Signed   By: Titus Dubin M.D.   On: 08/20/2020 15:24    EKG: Independently reviewed. Sinus tach  Assessment/Plan Principal Problem:   Sepsis due to pneumonia [facility]) Active Problems:   Former smoker   COPD GOLD III   Acute on chronic respiratory failure with hypoxia (HCC)   COPD exacerbation ([place])   1. PNA with sepsis present on admit 1. Despite 48-72hrs of doxy and prednisone, pt noted increasing shortness of breath and cough 2. Chest x-ray reviewed, findings notable for right upper lobe and lower lobe pneumonia with emphysematous changes 3. Patient is Covid negative 4. Patient does have a leukocytosis of just under 20,000, although pt is currently receiving prednisone taper.  5. Pt with tachypnea and tachycardia, thus meeting sepsis criteria 6. Agree with continuing patient on Rocephin and azithromycin empirically 7. Continue O2 as tolerated, wean O2 to goal of 2 L nasal cannula, her baseline 2. COPD 1. We will continue patient with as needed neb treatments 2. Wean O2 as tolerated 3. Acute on chronic hypoxemic respiratory failure 1. Per above, patient is normally on 2 L nasal cannula 2. 2 antibiotics and as needed neb treatments 3. Wean O2 as tolerated to patient's baseline home O2 requirement   DVT prophylaxis:  Lovenox subq  Code Status: Full Family Communication: Pt in room, family at bedside Disposition Plan:   Consults called:  Admission status: Inpatient as it would likely require greater than 2 midnight stay to treat pneumonia with sepsis with IV antibiotics given high mortality for dosed  Marylu Lund MD [facility] Pager On Amion  If 7PM-7AM, please contact night-coverage  08/20/2020, 4:51 PM

## 2020-08-20 NOTE — ED Notes (Signed)
This RN called for report, receiving RN needed more time, she was in a room.

## 2020-08-20 NOTE — ED Triage Notes (Signed)
Patient presents to ED with copd, had URI last week, finished antibiotics and steroids. Has not gotten better. o2 sat in triage 89% on 2L (patient normally around 95% on 2 L), increased o2 to 3L [place], sat increased to 95. Patient heart rate 120. Patient c/o right sided rib pain radiating toward back.

## 2020-08-20 NOTE — Progress Notes (Signed)
Notified bedside nurse of need to draw and administer antibiotics, lactic acid, blood cultures and fluid bolus.  Spoke with bedside RN to clarify any questions for code sepsis and to help understand the urgency of labs being drawn and abx started. Bedside RN acknowledges what needs to be done and elink direct number was left for bedside RN if she has any question to call us back for assistance.

## 2020-08-20 NOTE — Progress Notes (Signed)
This note also relates to the following rows which could not be included: ECG Heart Rate - Cannot attach notes to unvalidated device data Resp - Cannot attach notes to unvalidated device data    08/20/20 2129  Provider Notification  Provider Name/Title B Randol Kern  Date Provider Notified 08/20/20  Time Provider Notified 2129  Notification Type Page  Notification Reason Other (Comment) (Critical lab Lactic Acid 2.3)

## 2020-08-20 NOTE — Plan of Care (Signed)
  Problem: Clinical Measurements: Goal: Respiratory complications will improve Outcome: Progressing   

## 2020-08-21 LAB — CBC
HCT: 30.7 % — ABNORMAL LOW (ref 36.0–46.0)
Hemoglobin: 9.6 g/dL — ABNORMAL LOW (ref 12.0–15.0)
MCH: 28.9 pg (ref 26.0–34.0)
MCHC: 31.3 g/dL (ref 30.0–36.0)
MCV: 92.5 fL (ref 80.0–100.0)
Platelets: 278 10*3/uL (ref 150–400)
RBC: 3.32 MIL/uL — ABNORMAL LOW (ref 3.87–5.11)
RDW: 15.8 % — ABNORMAL HIGH (ref 11.5–15.5)
WBC: 13.4 10*3/uL — ABNORMAL HIGH (ref 4.0–10.5)
nRBC: 0 % (ref 0.0–0.2)

## 2020-08-21 LAB — COMPREHENSIVE METABOLIC PANEL
ALT: 33 U/L (ref 0–44)
AST: 35 U/L (ref 15–41)
Albumin: 2.7 g/dL — ABNORMAL LOW (ref 3.5–5.0)
Alkaline Phosphatase: 72 U/L (ref 38–126)
Anion gap: 12 (ref 5–15)
BUN: 13 mg/dL (ref 8–23)
CO2: 27 mmol/L (ref 22–32)
Calcium: 8.6 mg/dL — ABNORMAL LOW (ref 8.9–10.3)
Chloride: 102 mmol/L (ref 98–111)
Creatinine, Ser: 0.72 mg/dL (ref 0.44–1.00)
GFR calc Af Amer: >60 mL/min (ref >60)
GFR calc non Af Amer: >60 mL/min (ref >60)
Glucose, Bld: 101 mg/dL — ABNORMAL HIGH (ref 70–99)
Potassium: 3.6 mmol/L (ref 3.5–5.1)
Sodium: 141 mmol/L (ref 135–145)
Total Bilirubin: 0.9 mg/dL (ref 0.3–1.2)
Total Protein: 6 g/dL — ABNORMAL LOW (ref 6.5–8.1)

## 2020-08-21 LAB — LACTIC ACID, PLASMA: Lactic Acid, Venous: 0.8 mmol/L (ref 0.5–1.9)

## 2020-08-21 LAB — HIV ANTIBODY (ROUTINE TESTING W REFLEX): HIV Screen 4th Generation wRfx: NONREACTIVE

## 2020-08-21 MED ORDER — LACTATED RINGERS IV SOLN: INTRAVENOUS | Status: DC

## 2020-08-21 MED ORDER — FLUTICASONE FUROATE-VILANTEROL 100-25 MCG/INH IN AEPB
1.0000 | INHALATION_SPRAY | Freq: Every day | RESPIRATORY_TRACT | Status: DC
  Administered 2020-08-22 – 2020-08-25 (×4): 1 via RESPIRATORY_TRACT
  Filled 2020-08-21: qty 28

## 2020-08-21 MED ORDER — FLUTICASONE-UMECLIDIN-VILANT 100-62.5-25 MCG/INH IN AEPB: 1.0000 | INHALATION_SPRAY | Freq: Every morning | RESPIRATORY_TRACT | Status: DC

## 2020-08-21 MED ORDER — UMECLIDINIUM BROMIDE 62.5 MCG/INH IN AEPB
1.0000 | INHALATION_SPRAY | Freq: Every day | RESPIRATORY_TRACT | Status: DC
  Administered 2020-08-22 – 2020-08-25 (×4): 1 via RESPIRATORY_TRACT
  Filled 2020-08-21: qty 7

## 2020-08-21 MED ORDER — IPRATROPIUM-ALBUTEROL 0.5-2.5 (3) MG/3ML IN SOLN
3.0000 mL | Freq: Four times a day (QID) | RESPIRATORY_TRACT | Status: DC | PRN
  Administered 2020-08-21: 3 mL via RESPIRATORY_TRACT
  Filled 2020-08-21: qty 3

## 2020-08-21 MED ORDER — GUAIFENESIN ER 600 MG PO TB12
600.0000 mg | ORAL_TABLET | Freq: Two times a day (BID) | ORAL | Status: DC
  Administered 2020-08-21 – 2020-08-22 (×4): 600 mg via ORAL
  Filled 2020-08-21 (×5): qty 1

## 2020-08-21 NOTE — Progress Notes (Signed)
PROGRESS NOTE    [name]  JKD:[id number] DOB: May 07, 1932 DOA: 08/20/2020 PCP: Denita Lung, MD   Brief Narrative:  Patient is 86 year old female with history of COPD, tobacco abuse who presents to the emergency department complaints of worsening shortness of breath, cough.  She was recently diagnosed with community-acquired pneumonia and was treated with oral antibiotics and was also on a steroid taper but her symptoms did not improve and she presented to the emergency department.  On presentation, she was found to tachycardic with tachapnea.  Chest x-ray showed right upper and lower lobe pneumonia.  She was found to have leukocytosis.  Covid test negative.  Patient was started on IV antibiotics and admitted.   Assessment & Plan:   Principal Problem:   Sepsis due to pneumonia [facility]) Active Problems:   Former smoker   COPD GOLD III   Acute on chronic respiratory failure with hypoxia (HCC)   COPD exacerbation (HCC)   Acute respiratory failure with hypoxia: Secondary to pneumonia.  Requiring oxygen supplementation for maintenance of saturation.  Currently on 4 L of oxygen per minute.  On 2L  oxygen at home.  Community-acquired pneumonia: He failed outpatient antibiotic therapy.  Chest x-ray showed right upper/lower lobe pneumonia.  Covid test negative.  Has leukocytosis but was receiving steroid taper at home.  Started on ceftriaxone, azithromycin.  Will check urine streptococcal and Legionella antigen.  COPD: Continue bronchodilators, supplemental oxygen.  Debility/deconditioning: Patient lives alone.  As no residual functional impairment but will request for PT/OT evaluation due to debility from pneumonia.           DVT prophylaxis: Lovenox Code Status: Full Family Communication: Daughter present at the bedside Status is: Inpatient  Remains inpatient appropriate because:IV treatments appropriate due to intensity of illness or inability to take PO   Dispo: The  patient is from: Home              Anticipated d/c is to: Home              Anticipated d/c date is: 2 days              Patient currently is not medically stable to d/c.     Consultants: None  Procedures:None  Antimicrobials:  Anti-infectives (From admission, onward)   Start     Dose/Rate Route Frequency Ordered Stop   08/20/20 2000  azithromycin (ZITHROMAX) 500 mg in sodium chloride 0.9 % 250 mL IVPB        500 mg 250 mL/hr over 60 Minutes Intravenous Every 24 hours 08/20/20 1802     08/20/20 1830  cefTRIAXone (ROCEPHIN) 2 g in sodium chloride 0.9 % 100 mL IVPB        2 g 200 mL/hr over 30 Minutes Intravenous Every 24 hours 08/20/20 1802     08/20/20 1530  cefTRIAXone (ROCEPHIN) 1 g in sodium chloride 0.9 % 100 mL IVPB  Status:  Discontinued        1 g 200 mL/hr over 30 Minutes Intravenous  Once 08/20/20 1529 08/20/20 1802   08/20/20 1530  azithromycin (ZITHROMAX) 500 mg in sodium chloride 0.9 % 250 mL IVPB  Status:  Discontinued        500 mg 250 mL/hr over 60 Minutes Intravenous  Once 08/20/20 1529 08/20/20 1802      Subjective: Patient seen and examined the bedside this morning.  Hemodynamically stable during my evaluation.  Daughter was present at the bedside.  She feels better today.  She is  still on 4 L of oxygen per minute.  Has some cough.  Objective: Vitals:   08/20/20 1659 08/20/20 2121 08/21/20 0014 08/21/20 0442  BP: (!) 150/85 (!) 153/80 122/64 126/66  Pulse: (!) 104 97 99 95  Resp: 18 (!) 21 19 (!) 24  Temp: 99.6 F (37.6 C) 98.3 F (36.8 C) 98.1 F (36.7 C) 98.6 F (37 C)  TempSrc: Oral Oral Oral Oral  SpO2: 96% 97% 98% 94%  Weight: 60.5 kg     Height: 5\' 7"  (1.702 m)       Intake/Output Summary (Last 24 hours) at 08/21/2020 0835 Last data filed at 08/21/2020 0600 Gross per 24 hour  Intake 2987.78 ml  Output --  Net 2987.78 ml   Filed Weights   08/20/20 1108 08/20/20 1659  Weight: 61.2 kg 60.5 kg    Examination:  General exam: Very  pleasant elderly female HEENT:PERRL,Oral mucosa moist, Ear/Nose normal on gross exam Respiratory system: Coarse crackles on the right side  cardiovascular system: S1 & S2 heard, RRR. No JVD, murmurs, rubs, gallops or clicks. No pedal edema. Gastrointestinal system: Abdomen is nondistended, soft and nontender. No organomegaly or masses felt. Normal bowel sounds heard. Central nervous system: Alert and oriented. No focal neurological deficits. Extremities: No edema, no clubbing ,no cyanosis Skin: No rashes, lesions or ulcers,no icterus ,no pallor   Data Reviewed: I have personally reviewed following labs and imaging studies  CBC: Recent Labs  Lab 08/20/20 1300 08/20/20 1843 08/21/20 0503  WBC 19.8* 15.4* 13.4*  NEUTROABS 16.5* 12.3*  --   HGB 11.8* 11.3* 9.6*  HCT 37.1 35.6* 30.7*  MCV 91.2 92.5 92.5  PLT 316 320 902   Basic Metabolic Panel: Recent Labs  Lab 08/20/20 1300 08/20/20 1843 08/21/20 0503  NA 139 139 141  K 4.2 3.8 3.6  CL 101 101 102  CO2 27 27 27   GLUCOSE 129* 109* 101*  BUN 18 17 13   CREATININE 0.69 0.73 0.72  CALCIUM 8.9 8.6* 8.6*   GFR: Estimated Creatinine Clearance: 49.1 mL/min (by C-G formula based on SCr of 0.72 mg/dL). Liver Function Tests: Recent Labs  Lab 08/20/20 1300 08/20/20 1843 08/21/20 0503  AST 58* 45* 35  ALT 47* 39 33  ALKPHOS 85 82 72  BILITOT 0.8 0.7 0.9  PROT 7.6 7.0 6.0*  ALBUMIN 3.3* 3.0* 2.7*   No results for input(s): LIPASE, AMYLASE in the last 168 hours. No results for input(s): AMMONIA in the last 168 hours. Coagulation Profile: Recent Labs  Lab 08/20/20 1843  INR 1.2   Cardiac Enzymes: No results for input(s): CKTOTAL, CKMB, CKMBINDEX, TROPONINI in the last 168 hours. BNP (last 3 results) No results for input(s): PROBNP in the last 8760 hours. HbA1C: No results for input(s): HGBA1C in the last 72 hours. CBG: No results for input(s): GLUCAP in the last 168 hours. Lipid Profile: No results for input(s):  CHOL, HDL, LDLCALC, TRIG, CHOLHDL, LDLDIRECT in the last 72 hours. Thyroid Function Tests: No results for input(s): TSH, T4TOTAL, FREET4, T3FREE, THYROIDAB in the last 72 hours. Anemia Panel: No results for input(s): VITAMINB12, FOLATE, FERRITIN, TIBC, IRON, RETICCTPCT in the last 72 hours. Sepsis Labs: Recent Labs  Lab 08/20/20 1843 08/20/20 1918 08/21/20 0503  LATICACIDVEN 1.1 2.3* 0.8    Recent Results (from the past 240 hour(s))  SARS Coronavirus 2 by RT PCR (hospital order, performed in [facility] hospital lab) Nasopharyngeal Nasopharyngeal Swab     Status: None   Collection Time:  08/20/20  1:00 PM   Specimen: Nasopharyngeal Swab  Result Value Ref Range Status   SARS Coronavirus 2 NEGATIVE NEGATIVE Final    Comment: (NOTE) SARS-CoV-2 target nucleic acids are NOT DETECTED.  The SARS-CoV-2 RNA is generally detectable in upper and lower respiratory specimens during the acute phase of infection. The lowest concentration of SARS-CoV-2 viral copies this assay can detect is 250 copies / mL. A negative result does not preclude SARS-CoV-2 infection and should not be used as the sole basis for treatment or other patient management decisions.  A negative result may occur with improper specimen collection / handling, submission of specimen other than nasopharyngeal swab, presence of viral mutation(s) within the areas targeted by this assay, and inadequate number of viral copies (<250 copies / mL). A negative result must be combined with clinical observations, patient history, and epidemiological information.  Fact Sheet for Patients:   StrictlyIdeas.no  Fact Sheet for Healthcare Providers: BankingDealers.co.za  This test is not yet approved or  cleared by the [place] FDA and has been authorized for detection and/or diagnosis of SARS-CoV-2 by FDA under an Emergency Use Authorization (EUA).  This EUA will remain in effect  (meaning this test can be used) for the duration of the COVID-19 declaration under Section 564(b)(1) of the Act, 21 U.S.C. section 360bbb-3(b)(1), unless the authorization is terminated or revoked sooner.  Performed at [facility] [place] [address] [place], [place] [address]          Radiology Studies: DG Chest 2 View  Result Date: 08/20/2020 CLINICAL DATA:  COPD.  Cough and lung pain. EXAM: CHEST - 2 VIEW COMPARISON:  05/08/2016 FINDINGS: Normal cardiomediastinal contours. Aortic atherosclerotic calcifications noted. The lungs are hyperinflated and there are coarsened interstitial changes compatible with emphysema. Dense airspace consolidation is identified within the posterior right upper lobe. A second opacity is identified within the posterior right lung base. Left lung clear. IMPRESSION: 1. Right upper and lower lobe pneumonia. Followup PA and lateral chest X-ray is recommended in 3-4 weeks following trial of antibiotic therapy to ensure resolution and exclude underlying malignancy. 2. Emphysema. Electronically Signed   By: Kerby Moors M.D.   On: 08/20/2020 11:47   CT Angio Chest PE W/Cm &/Or Wo Cm  Result Date: 08/20/2020 CLINICAL DATA:  Right-sided chest pain. Shortness of breath. History of COPD. EXAM: CT ANGIOGRAPHY CHEST WITH CONTRAST TECHNIQUE: Multidetector CT imaging of the chest was performed using the standard protocol during bolus administration of intravenous contrast. Multiplanar CT image reconstructions and MIPs were obtained to evaluate the vascular anatomy. CONTRAST:  56mL OMNIPAQUE IOHEXOL 350 MG/ML SOLN COMPARISON:  Chest x-ray from same day. CTA chest dated May 06, 2013. FINDINGS: Cardiovascular: Satisfactory opacification of the pulmonary arteries to the segmental level. No evidence of pulmonary embolism. Unchanged dilated main and right pulmonary arteries measuring up to 3.9 cm diameter. Normal heart size. No pericardial effusion. No thoracic  aortic aneurysm. Coronary, aortic arch, and branch vessel atherosclerotic vascular disease. Mediastinum/Nodes: Mildly enlarged right hilar lymph node measuring 1.3 cm in short axis (series 5, image 76), likely reactive. No additional enlarged mediastinal, hilar, or axillary lymph nodes. The thyroid gland, trachea, and esophagus demonstrate no significant findings. Lungs/Pleura: Severe centrilobular emphysema. Patchy consolidation in the right upper lobe and to a lesser extent the right lower lobe. Small right pleural effusion. Chronic mucous plugging in the right lower lobe posterior basal segment. Unchanged 2 mm nodule in the left upper lobe. Linear scarring in the left  lower lobe. Upper Abdomen: No acute abnormality.  Small hiatal hernia. Musculoskeletal: No chest wall abnormality. No acute or significant osseous findings. Review of the MIP images confirms the above findings. IMPRESSION: 1. No evidence of pulmonary embolism. 2. Patchy consolidation in the right upper lobe and to a lesser extent the right lower lobe, consistent with multifocal pneumonia. 3. Small right pleural effusion. 4. Unchanged dilated main and right pulmonary arteries, consistent with pulmonary arterial hypertension. 5. Aortic Atherosclerosis (ICD10-I70.0) and Emphysema (ICD10-J43.9). Electronically Signed   By: Titus Dubin M.D.   On: 08/20/2020 15:24        Scheduled Meds: . brimonidine  1 drop Right Eye TID  . enoxaparin (LOVENOX) injection  40 mg Subcutaneous Q24H  . ipratropium-albuterol  3 mL Nebulization Once   Continuous Infusions: . azithromycin 500 mg (08/20/20 2157)  . cefTRIAXone (ROCEPHIN)  IV 2 g (08/20/20 1928)  . lactated ringers 100 mL/hr at 08/21/20 0048     LOS: 1 day    Time spent: More than 50% of that time was spent in counseling and/or coordination of care.      Shelly Coss, MD [facility] P8/29/2021, 8:35 AM

## 2020-08-21 NOTE — Plan of Care (Signed)

## 2020-08-21 NOTE — Evaluation (Signed)
Occupational Therapy Evaluation Patient Details Name: [name] MRN: [id number] DOB: August 21, 1932 Today's Date: 08/21/2020    History of Present Illness [name] is a 86 y.o. female with medical history significant of COPD, prior tobacco abuse who presents to the hospital with worsening shortness of breath and cough and found to have acute on chronic hypoxemic respiratory failure and COPD exacerbation. Patient uses 2 liter o2 at home and currently on 4 liters.   Clinical Impression   [name] is an 86 year old woman with above medical history who presents with decreased activity tolerance and requiring increased oxygenation compared to baseline. Patient able to perform bed mobility with increased time, standing and ADLs with min guard for safety, and limited mobility by bed due to decreased activity tolerance. O2 sats between 92-95% on 4 liters. Patient will benefit from skilled OT services while in hospital to improve functional abilities in order to return home at discharge. Do note expect any Ot needs at discharge.    Follow Up Recommendations  No OT follow up    Equipment Recommendations  None recommended by OT    Recommendations for Other Services       Precautions / Restrictions Precautions Precautions: None Restrictions Weight Bearing Restrictions: No      Mobility Bed Mobility Overal bed mobility: Modified Independent Bed Mobility: Supine to Sit;Sit to Supine     Supine to sit: Modified independent (Device/Increase time) Sit to supine: Modified independent (Device/Increase time)      Transfers Overall transfer level: Needs assistance Equipment used: 1 person hand held assist Transfers: Sit to/from [facility] Sit to Stand: Min guard Stand pivot transfers: Min guard       General transfer comment: one hand hold to ambulate up and down the bed.    Balance Overall balance assessment: Needs assistance Sitting-balance  support: No upper extremity supported;Feet supported Sitting balance-Leahy Scale: Good     Standing balance support: No upper extremity supported Standing balance-Leahy Scale: Fair Standing balance comment: Able to wish stand three gentle anterior nudges without hand hold support but uses single hand support with ambulation/transfer                           ADL either performed or assessed with clinical judgement   ADL Overall ADL's : Needs assistance/impaired Eating/Feeding: Independent   Grooming: Sitting;Set up   Upper Body Bathing: Sitting;Set up   Lower Body Bathing: Set up;Sit to/from stand   Upper Body Dressing : Set up;Sitting   Lower Body Dressing: Set up;Sit to/from stand Lower Body Dressing Details (indicate cue type and reason): Patient able to donn socks at side of bed. Toilet Transfer: Runner, broadcasting/film/video and Hygiene: Min guard;Sit to/from stand;Set up               Vision Patient Visual Report: No change from baseline       Perception     Praxis      Pertinent Vitals/Pain Pain Assessment: No/denies pain     Hand Dominance Right   Extremity/Trunk Assessment Upper Extremity Assessment Upper Extremity Assessment: Overall WFL for tasks assessed   Lower Extremity Assessment Lower Extremity Assessment: Defer to PT evaluation   Cervical / Trunk Assessment Cervical / Trunk Assessment: Normal   Communication Communication Communication: No difficulties   Cognition Arousal/Alertness: Awake/alert Behavior During Therapy: WFL for tasks assessed/performed Overall Cognitive Status: Within Functional Limits for tasks assessed  General Comments       Exercises     Shoulder Instructions      Home Living Family/patient expects to be discharged to:: Private residence Living Arrangements: Alone Available Help at Discharge: Available  PRN/intermittently;Family Type of Home: House                       Home Equipment: Walker - 4 wheels;Shower seat   Additional Comments: Uses a rollator in the community and typicaly furniture walks at home.      Prior Functioning/Environment Level of Independence: Independent with assistive device(s)                 OT Problem List: Decreased activity tolerance;Cardiopulmonary status limiting activity      OT Treatment/Interventions: Self-care/ADL training;Patient/family education;Therapeutic activities;DME and/or AE instruction    OT Goals(Current goals can be found in the care plan section) Acute Rehab OT Goals Patient Stated Goal: to increase activity OT Goal Formulation: With patient/family Time For Goal Achievement: 09/04/20 Potential to Achieve Goals: Good  OT Frequency: Min 2X/week   Barriers to D/C:            Co-evaluation              AM-PAC OT "6 Clicks" Daily Activity     Outcome Measure Help from another person eating meals?: None Help from another person taking care of personal grooming?: A Little Help from another person toileting, which includes using toliet, bedpan, or urinal?: A Little Help from another person bathing (including washing, rinsing, drying)?: A Little Help from another person to put on and taking off regular upper body clothing?: A Little Help from another person to put on and taking off regular lower body clothing?: A Little 6 Click Score: 19   End of Session Equipment Utilized During Treatment: Oxygen Nurse Communication: Mobility status  Activity Tolerance: Patient tolerated treatment well Patient left: in bed;with bed alarm set;with family/visitor present  OT Visit Diagnosis: Muscle weakness (generalized) (M62.81)                Time: 9741-6384 OT Time Calculation (min): 15 min Charges:  OT General Charges $OT Visit: 1 Visit OT Evaluation $OT Eval Low Complexity: 1 Low  [name], OTR/L [place]  Office [phone] Pager: [phone]   [name] 08/21/2020, 1:43 PM

## 2020-08-21 NOTE — Evaluation (Signed)
Physical Therapy Evaluation Patient Details Name: [name] MRN: [id number] DOB: [birth year] Today's Date: 08/21/2020   History of Present Illness  86 y.o. female with medical history significant of COPD, prior tobacco abuse who presents to the hospital with worsening shortness of breath and cough and found to have acute on chronic hypoxemic respiratory failure and COPD exacerbation. Patient uses 2 liter o2 at home and currently on 4 liters.  Clinical Impression  On eval, tp required Min assist for mobility. She walked ~60 feet with use of a RW for safety. O2 92% on 4L [place], dyspnea 2/4 during ambulation. Pt is motivated to get well and progress activity. She is agreeable to HHPT f/u. Will continue to follow pt during hospital stay    Follow Up Recommendations Home health PT;Supervision - Intermittent    Equipment Recommendations  None recommended by PT    Recommendations for Other Services       Precautions / Restrictions Precautions Precautions: Fall Precaution Comments: O2 dep Restrictions Weight Bearing Restrictions: No      Mobility  Bed Mobility Overal bed mobility: Modified Independent Bed Mobility: Supine to Sit;Sit to Supine     Supine to sit: Modified independent (Device/Increase time) Sit to supine: Modified independent (Device/Increase time)      Transfers Overall transfer level: Needs assistance Equipment used: 1 person hand held assist Transfers: Sit to/from Stand Sit to Stand: Min assist        General transfer comment: Assist to power up (especially from low toilet surface), steady, control descent. Cues for safety, hand placement. Unsteady.  Ambulation/Gait Ambulation/Gait assistance: Min guard Gait Distance (Feet): 60 Feet Assistive device: IV Pole;Rolling walker (2 wheeled) Gait Pattern/deviations: Step-through pattern;Decreased stride length     General Gait Details: Walked x 1 with IV pole to bathroom. Then x 1 with RW in hallway. O2  92% on 4L [place], dyspnea 2/4 with ambulation  Stairs            Wheelchair Mobility    Modified Rankin (Stroke Patients Only)       Balance Overall balance assessment: Needs assistance Sitting-balance support: No upper extremity supported;Feet supported Sitting balance-Leahy Scale: Good     Standing balance support: No upper extremity supported Standing balance-Leahy Scale: Poor Standing balance comment: Able to wish stand three gentle anterior nudges without hand hold support but uses single hand support with ambulation/transfer                             Pertinent Vitals/Pain Pain Assessment: No/denies pain    Home Living Family/patient expects to be discharged to:: Private residence Living Arrangements: Alone Available Help at Discharge: Available PRN/intermittently;Family Type of Home: House         Home Equipment: Walker - 4 wheels;Shower seat Additional Comments: Uses a rollator in the community and typicaly furniture walks at home.    Prior Function Level of Independence: Independent with assistive device(s)               Hand Dominance   Dominant Hand: Right    Extremity/Trunk Assessment   Upper Extremity Assessment Upper Extremity Assessment: Defer to OT evaluation    Lower Extremity Assessment Lower Extremity Assessment: Generalized weakness    Cervical / Trunk Assessment Cervical / Trunk Assessment: Normal  Communication   Communication: HOH  Cognition Arousal/Alertness: Awake/alert Behavior During Therapy: WFL for tasks assessed/performed Overall Cognitive Status: Within Functional Limits for tasks assessed  General Comments      Exercises     Assessment/Plan    PT Assessment Patient needs continued PT services  PT Problem List Decreased strength;Decreased mobility;Decreased activity tolerance;Decreased balance;Decreased knowledge of use of DME       PT  Treatment Interventions DME instruction;Gait training;Therapeutic activities;Therapeutic exercise;Patient/family education;Balance training;Functional mobility training    PT Goals (Current goals can be found in the Care Plan section)  Acute Rehab PT Goals Patient Stated Goal: to increase activity PT Goal Formulation: With patient/family Time For Goal Achievement: 09/04/20 Potential to Achieve Goals: Good    Frequency Min 3X/week   Barriers to discharge        Co-evaluation               AM-PAC PT "6 Clicks" Mobility  Outcome Measure Help needed turning from your back to your side while in a flat bed without using bedrails?: None Help needed moving from lying on your back to sitting on the side of a flat bed without using bedrails?: None Help needed moving to and from a bed to a chair (including a wheelchair)?: A Little Help needed standing up from a chair using your arms (e.g., wheelchair or bedside chair)?: A Little Help needed to walk in hospital room?: A Little Help needed climbing 3-5 steps with a railing? : A Little 6 Click Score: 20    End of Session Equipment Utilized During Treatment: Gait belt;Oxygen Activity Tolerance: Patient tolerated treatment well Patient left: in bed;with call bell/phone within reach;with bed alarm set;with family/visitor present   PT Visit Diagnosis: Muscle weakness (generalized) (M62.81);Unsteadiness on feet (R26.81);Difficulty in walking, not elsewhere classified (R26.2)    Time: 5300-5110 PT Time Calculation (min) (ACUTE ONLY): 14 min   Charges:   PT Evaluation $PT Eval Low Complexity: [place], PT Acute Rehabilitation  Office: [phone] Pager: [phone]

## 2020-08-22 LAB — CBC WITH DIFFERENTIAL/PLATELET
Abs Immature Granulocytes: 0.2 10*3/uL — ABNORMAL HIGH (ref 0.00–0.07)
Basophils Absolute: 0 10*3/uL (ref 0.0–0.1)
Basophils Relative: 0 %
Eosinophils Absolute: 0.1 10*3/uL (ref 0.0–0.5)
Eosinophils Relative: 1 %
HCT: 30.6 % — ABNORMAL LOW (ref 36.0–46.0)
Hemoglobin: 9.3 g/dL — ABNORMAL LOW (ref 12.0–15.0)
Immature Granulocytes: 2 %
Lymphocytes Relative: 8 %
Lymphs Abs: 0.9 10*3/uL (ref 0.7–4.0)
MCH: 28.7 pg (ref 26.0–34.0)
MCHC: 30.4 g/dL (ref 30.0–36.0)
MCV: 94.4 fL (ref 80.0–100.0)
Monocytes Absolute: 1.2 10*3/uL — ABNORMAL HIGH (ref 0.1–1.0)
Monocytes Relative: 9 %
Neutro Abs: 10.1 10*3/uL — ABNORMAL HIGH (ref 1.7–7.7)
Neutrophils Relative %: 80 %
Platelets: 253 10*3/uL (ref 150–400)
RBC: 3.24 MIL/uL — ABNORMAL LOW (ref 3.87–5.11)
RDW: 15.6 % — ABNORMAL HIGH (ref 11.5–15.5)
WBC: 12.6 10*3/uL — ABNORMAL HIGH (ref 4.0–10.5)
nRBC: 0 % (ref 0.0–0.2)

## 2020-08-22 LAB — STREP PNEUMONIAE URINARY ANTIGEN: Strep Pneumo Urinary Antigen: NEGATIVE

## 2020-08-22 MED ORDER — METHYLPREDNISOLONE SODIUM SUCC 125 MG IJ SOLR
40.0000 mg | Freq: Two times a day (BID) | INTRAMUSCULAR | Status: DC
  Administered 2020-08-22 – 2020-08-23 (×2): 40 mg via INTRAVENOUS
  Filled 2020-08-22 (×2): qty 2

## 2020-08-22 MED ORDER — RISAQUAD PO CAPS
2.0000 | ORAL_CAPSULE | Freq: Three times a day (TID) | ORAL | Status: DC
  Administered 2020-08-22 – 2020-08-25 (×9): 2 via ORAL
  Filled 2020-08-22 (×9): qty 2

## 2020-08-22 NOTE — TOC Initial Note (Signed)
Transition of Care [facility]) - Initial/Assessment Note    Patient Details  Name: [name] MRN: [id number] Date of Birth: 04-29-1932  Transition of Care (TOC) CM/SW Contact:    Joaquin Courts, RN Phone Number: 08/22/2020, 11:48 AM  Clinical Narrative:  CM spoke with patient regarding recommendation for HHPT.  Patient is in agreement and set up with [facility].  MD please place [facility] order prior to dc.                   Expected Discharge Plan: [place] Barriers to Discharge: No Barriers Identified   Patient Goals and CMS Choice Patient states their goals for this hospitalization and ongoing recovery are:: to go home with therapy CMS Medicare.gov Compare Post Acute Care list provided to:: Patient Choice offered to / list presented to : Patient  Expected Discharge Plan and Services Expected Discharge Plan: [place]   Discharge Planning Services: CM Consult Post Acute Care Choice: [place] arrangements for the past 2 months: Single Family Home                 DME Arranged: N/A DME Agency: NA       HH Arranged: PT [place] Agency: [place]) Date [place]: 08/22/20 Time [place]: 1147 Representative spoke with at [place]: Santiago Glad  Prior Living Arrangements/Services Living arrangements for the past 2 months: [place]   Patient language and need for interpreter reviewed:: Yes Do you feel safe going back to the place where you live?: Yes      Need for Family Participation in Patient Care: Yes (Comment) Care giver support system in place?: Yes (comment)   Criminal Activity/Legal Involvement Pertinent to Current Situation/Hospitalization: No - Comment as needed  Activities of Daily Living Home Assistive Devices/Equipment: Oxygen, Shower chair with back, Environmental consultant (specify type), Wheelchair, Hearing aid ADL Screening (condition at time of admission) Patient's cognitive  ability adequate to safely complete daily activities?: Yes Is the patient deaf or have difficulty hearing?: Yes Does the patient have difficulty seeing, even when wearing glasses/contacts?: No Does the patient have difficulty concentrating, remembering, or making decisions?: No Patient able to express need for assistance with ADLs?: Yes Does the patient have difficulty dressing or bathing?: Yes Independently performs ADLs?: No Communication: Independent Dressing (OT): Needs assistance Is this a change from baseline?: Pre-admission baseline Grooming: Needs assistance Is this a change from baseline?: Pre-admission baseline Feeding: Independent Bathing: Needs assistance Is this a change from baseline?: Pre-admission baseline Toileting: Needs assistance Is this a change from baseline?: Pre-admission baseline In/Out Bed: Needs assistance Is this a change from baseline?: Pre-admission baseline Walks in Home: Independent with device (comment) Does the patient have difficulty walking or climbing stairs?: Yes Weakness of Legs: Both Weakness of Arms/Hands: None  Permission Sought/Granted                  Emotional Assessment Appearance:: Appears stated age Attitude/Demeanor/Rapport: Engaged Affect (typically observed): Accepting Orientation: : Oriented to Self, Oriented to Place, Oriented to  Time, Oriented to Situation   Psych Involvement: No (comment)  Admission diagnosis:  Sepsis due to pneumonia ([place]) [J18.9, A41.9] Multifocal pneumonia [J18.9] Patient Active Problem List   Diagnosis Date Noted  . Sepsis due to pneumonia ([place]) 08/20/2020  . Chronic respiratory failure with hypoxia ([place]) 08/17/2020  . Encounter for medication review and counseling 05/02/2018  . Sciatica, left side 05/22/2017  . Buttock pain  05/22/2017  . Foreign body in ear, right, initial encounter 01/30/2017  . Impacted cerumen of both ears 01/30/2017  . Research study patient 08/09/2014  . Acute on  chronic respiratory failure with hypoxia ([place]) 02/28/2013  . COPD exacerbation ([place]) 02/28/2013  . Renal insufficiency 02/28/2013  . Abnormal CXR 07/07/2012  . Chronic respiratory failure with hypercapnia ([place]) 01/21/2012  . Former smoker 01/16/2010  . COPD GOLD III 01/16/2010   PCP:  Denita Lung, MD Pharmacy:   [facility] [place], [address] [place] [address] Phone: [phone] Fax: [phone]  [place], [address] [place] [address] Phone: [phone] Fax: [phone]     Social Determinants of Health (SDOH) Interventions    Readmission Risk Interventions No flowsheet data found.

## 2020-08-22 NOTE — Progress Notes (Addendum)
Pt YELLOW MEWS, due to increase HR and respiration, rechecked pt to verify VS, O2 sat decreased on 2 l to 88%, pt O2 increased to 3l, O2 sat increased to 91-93%, Solumedrol given as ordered. Will cont to monitor. MD made aware and responded at the initial page at 1600.  SRP, RN

## 2020-08-22 NOTE — Progress Notes (Signed)
PROGRESS NOTE    [name]  XTK:[id number] DOB: 27-Apr-1932 DOA: 08/20/2020 PCP: Denita Lung, MD   Brief Narrative:  Patient is 86 year old female with history of COPD, tobacco abuse who presents to the emergency department complaints of worsening shortness of breath, cough.  She was recently diagnosed with community-acquired pneumonia and was treated with oral antibiotics and was also on a steroid taper but her symptoms did not improve and she presented to the emergency department.  On presentation, she was found to tachycardic with tachapnea.  Chest x-ray showed right upper and lower lobe pneumonia.  She was found to have leukocytosis.  Covid test negative.  Patient was started on IV antibiotics and admitted.   Assessment & Plan:   Principal Problem:   Sepsis due to pneumonia [facility]) Active Problems:   Former smoker   COPD GOLD III   Acute on chronic respiratory failure with hypoxia (HCC)   COPD exacerbation (HCC)   Acute respiratory failure with hypoxia: Secondary to pneumonia.  Requiring oxygen supplementation for maintenance of saturation.  Currently on 4 L of oxygen per minute.  On 2L  oxygen at home.  We will try to wean the oxygen back to her baseline.  Community-acquired pneumonia: He failed outpatient antibiotic therapy.  Chest x-ray showed right upper/lower lobe pneumonia.  Covid test negative.  Has leukocytosis but was receiving steroid taper at home.  Started on ceftriaxone, azithromycin.  Negative urine streptococcal and Legionella antigen.  Continue mucolytic's  COPD: Continue bronchodilators, supplemental oxygen.  Debility/deconditioning: Patient lives alone.has  no residual functional impairment.  PT/OT recommended home meds on discharge.          DVT prophylaxis: Lovenox Code Status: Full Family Communication: Daughter present at the bedside Status is: Inpatient  Remains inpatient appropriate because:IV treatments appropriate due to intensity of  illness or inability to take PO   Dispo: The patient is from: Home              Anticipated d/c is to: Home              Anticipated d/c date is tomorrow              Patient currently is not medically stable to d/c. Continue IV antibiotics for today, plan to change to oral antibiotics tomorrow before discharge.    Consultants: None  Procedures:None  Antimicrobials:  Anti-infectives (From admission, onward)   Start     Dose/Rate Route Frequency Ordered Stop   08/20/20 2000  azithromycin (ZITHROMAX) 500 mg in sodium chloride 0.9 % 250 mL IVPB        500 mg 250 mL/hr over 60 Minutes Intravenous Every 24 hours 08/20/20 1802     08/20/20 1830  cefTRIAXone (ROCEPHIN) 2 g in sodium chloride 0.9 % 100 mL IVPB        2 g 200 mL/hr over 30 Minutes Intravenous Every 24 hours 08/20/20 1802     08/20/20 1530  cefTRIAXone (ROCEPHIN) 1 g in sodium chloride 0.9 % 100 mL IVPB  Status:  Discontinued        1 g 200 mL/hr over 30 Minutes Intravenous  Once 08/20/20 1529 08/20/20 1802   08/20/20 1530  azithromycin (ZITHROMAX) 500 mg in sodium chloride 0.9 % 250 mL IVPB  Status:  Discontinued        500 mg 250 mL/hr over 60 Minutes Intravenous  Once 08/20/20 1529 08/20/20 1802      Subjective:  Patient seen and examined the bedside  this morning.  Hemodynamically stable.  Still coughing, looks weak.  Oxygen tapered to 2 L/min.  Daughter was at the bedside.  Challenging situation for discharge because she lives alone with around 6 hours of supervision a day.  Objective: Vitals:   08/21/20 0442 08/21/20 1216 08/21/20 2111 08/22/20 0518  BP: 126/66 (!) 116/57 (!) 106/56 (!) 146/67  Pulse: 95 99 89 90  Resp: (!) 24 20 (!) 24 18  Temp: 98.6 F (37 C) 98.2 F (36.8 C) 98.6 F (37 C) 98.3 F (36.8 C)  TempSrc: Oral Oral Oral Oral  SpO2: 94% 93% 95% 95%  Weight:      Height:        Intake/Output Summary (Last 24 hours) at 08/22/2020 0820 Last data filed at 08/22/2020 2595 Gross per 24 hour    Intake 3160.7 ml  Output 650 ml  Net 2510.7 ml   Filed Weights   08/20/20 1108 08/20/20 1659  Weight: 61.2 kg 60.5 kg    Examination:  General exam: Pleasant elderly female, deconditioned, dilatated HEENT:PERRL,Oral mucosa moist, Ear/Nose normal on gross exam Respiratory system: Bilateral diminished air sounds, rhonchi  cardiovascular system: S1 & S2 heard, RRR. No JVD, murmurs, rubs, gallops or clicks. Gastrointestinal system: Abdomen is nondistended, soft and nontender. No organomegaly or masses felt. Normal bowel sounds heard. Central nervous system: Alert and oriented. No focal neurological deficits. Extremities: No edema, no clubbing ,no cyanosis Skin: No rashes, lesions or ulcers,no icterus ,no pallor    Data Reviewed: I have personally reviewed following labs and imaging studies  CBC: Recent Labs  Lab 08/20/20 1300 08/20/20 1843 08/21/20 0503 08/22/20 0548  WBC 19.8* 15.4* 13.4* 12.6*  NEUTROABS 16.5* 12.3*  --  10.1*  HGB 11.8* 11.3* 9.6* 9.3*  HCT 37.1 35.6* 30.7* 30.6*  MCV 91.2 92.5 92.5 94.4  PLT 316 320 278 638   Basic Metabolic Panel: Recent Labs  Lab 08/20/20 1300 08/20/20 1843 08/21/20 0503  NA 139 139 141  K 4.2 3.8 3.6  CL 101 101 102  CO2 27 27 27   GLUCOSE 129* 109* 101*  BUN 18 17 13   CREATININE 0.69 0.73 0.72  CALCIUM 8.9 8.6* 8.6*   GFR: Estimated Creatinine Clearance: 49.1 mL/min (by C-G formula based on SCr of 0.72 mg/dL). Liver Function Tests: Recent Labs  Lab 08/20/20 1300 08/20/20 1843 08/21/20 0503  AST 58* 45* 35  ALT 47* 39 33  ALKPHOS 85 82 72  BILITOT 0.8 0.7 0.9  PROT 7.6 7.0 6.0*  ALBUMIN 3.3* 3.0* 2.7*   No results for input(s): LIPASE, AMYLASE in the last 168 hours. No results for input(s): AMMONIA in the last 168 hours. Coagulation Profile: Recent Labs  Lab 08/20/20 1843  INR 1.2   Cardiac Enzymes: No results for input(s): CKTOTAL, CKMB, CKMBINDEX, TROPONINI in the last 168 hours. BNP (last 3  results) No results for input(s): PROBNP in the last 8760 hours. HbA1C: No results for input(s): HGBA1C in the last 72 hours. CBG: No results for input(s): GLUCAP in the last 168 hours. Lipid Profile: No results for input(s): CHOL, HDL, LDLCALC, TRIG, CHOLHDL, LDLDIRECT in the last 72 hours. Thyroid Function Tests: No results for input(s): TSH, T4TOTAL, FREET4, T3FREE, THYROIDAB in the last 72 hours. Anemia Panel: No results for input(s): VITAMINB12, FOLATE, FERRITIN, TIBC, IRON, RETICCTPCT in the last 72 hours. Sepsis Labs: Recent Labs  Lab 08/20/20 1843 08/20/20 1918 08/21/20 0503  LATICACIDVEN 1.1 2.3* 0.8    Recent Results (from the past 240 hour(s))  SARS Coronavirus 2 by RT PCR (hospital order, performed in [facility] hospital lab) Nasopharyngeal Nasopharyngeal Swab     Status: None   Collection Time: 08/20/20  1:00 PM   Specimen: Nasopharyngeal Swab  Result Value Ref Range Status   SARS Coronavirus 2 NEGATIVE NEGATIVE Final    Comment: (NOTE) SARS-CoV-2 target nucleic acids are NOT DETECTED.  The SARS-CoV-2 RNA is generally detectable in upper and lower respiratory specimens during the acute phase of infection. The lowest concentration of SARS-CoV-2 viral copies this assay can detect is 250 copies / mL. A negative result does not preclude SARS-CoV-2 infection and should not be used as the sole basis for treatment or other patient management decisions.  A negative result may occur with improper specimen collection / handling, submission of specimen other than nasopharyngeal swab, presence of viral mutation(s) within the areas targeted by this assay, and inadequate number of viral copies (<250 copies / mL). A negative result must be combined with clinical observations, patient history, and epidemiological information.  Fact Sheet for Patients:   StrictlyIdeas.no  Fact Sheet for Healthcare  Providers: BankingDealers.co.za  This test is not yet approved or  cleared by the [place] FDA and has been authorized for detection and/or diagnosis of SARS-CoV-2 by FDA under an Emergency Use Authorization (EUA).  This EUA will remain in effect (meaning this test can be used) for the duration of the COVID-19 declaration under Section 564(b)(1) of the Act, 21 U.S.C. section 360bbb-3(b)(1), unless the authorization is terminated or revoked sooner.  Performed at [facility], [place] [address], [place], [place] [address]   Culture, blood (x 2)     Status: None (Preliminary result)   Collection Time: 08/20/20  6:40 PM   Specimen: BLOOD  Result Value Ref Range Status   Specimen Description   Final    BLOOD RIGHT ANTECUBITAL Performed at [facility] [address] [place], [place] [address]    Special Requests   Final    BOTTLES DRAWN AEROBIC AND ANAEROBIC Blood Culture adequate volume Performed at [facility] [address], [place], [place] [address]    Culture   Final    NO GROWTH < 24 HOURS Performed at [facility] [address], [place], [place] [address]    Report Status PENDING  Incomplete  Culture, blood (x 2)     Status: None (Preliminary result)   Collection Time: 08/20/20  7:18 PM   Specimen: BLOOD LEFT HAND  Result Value Ref Range Status   Specimen Description   Final    BLOOD LEFT HAND Performed at [facility] [address] [place], [place] [address]    Special Requests   Final    BOTTLES DRAWN AEROBIC ONLY Blood Culture adequate volume Performed at [facility] [address], [place], [place] [address]    Culture   Final    NO GROWTH < 24 HOURS Performed at [facility] [address], [place], [place] [address]    Report Status PENDING  Incomplete         Radiology Studies: DG Chest 2 View  Result Date:  08/20/2020 CLINICAL DATA:  COPD.  Cough and lung pain. EXAM: CHEST - 2 VIEW COMPARISON:  05/08/2016 FINDINGS: Normal cardiomediastinal contours. Aortic atherosclerotic calcifications noted. The lungs are hyperinflated and there are coarsened interstitial changes compatible with emphysema. Dense airspace consolidation is identified within the posterior right upper lobe. A second  opacity is identified within the posterior right lung base. Left lung clear. IMPRESSION: 1. Right upper and lower lobe pneumonia. Followup PA and lateral chest X-ray is recommended in 3-4 weeks following trial of antibiotic therapy to ensure resolution and exclude underlying malignancy. 2. Emphysema. Electronically Signed   By: Kerby Moors M.D.   On: 08/20/2020 11:47   CT Angio Chest PE W/Cm &/Or Wo Cm  Result Date: 08/20/2020 CLINICAL DATA:  Right-sided chest pain. Shortness of breath. History of COPD. EXAM: CT ANGIOGRAPHY CHEST WITH CONTRAST TECHNIQUE: Multidetector CT imaging of the chest was performed using the standard protocol during bolus administration of intravenous contrast. Multiplanar CT image reconstructions and MIPs were obtained to evaluate the vascular anatomy. CONTRAST:  24mL OMNIPAQUE IOHEXOL 350 MG/ML SOLN COMPARISON:  Chest x-ray from same day. CTA chest dated May 06, 2013. FINDINGS: Cardiovascular: Satisfactory opacification of the pulmonary arteries to the segmental level. No evidence of pulmonary embolism. Unchanged dilated main and right pulmonary arteries measuring up to 3.9 cm diameter. Normal heart size. No pericardial effusion. No thoracic aortic aneurysm. Coronary, aortic arch, and branch vessel atherosclerotic vascular disease. Mediastinum/Nodes: Mildly enlarged right hilar lymph node measuring 1.3 cm in short axis (series 5, image 76), likely reactive. No additional enlarged mediastinal, hilar, or axillary lymph nodes. The thyroid gland, trachea, and esophagus demonstrate no significant findings.  Lungs/Pleura: Severe centrilobular emphysema. Patchy consolidation in the right upper lobe and to a lesser extent the right lower lobe. Small right pleural effusion. Chronic mucous plugging in the right lower lobe posterior basal segment. Unchanged 2 mm nodule in the left upper lobe. Linear scarring in the left lower lobe. Upper Abdomen: No acute abnormality.  Small hiatal hernia. Musculoskeletal: No chest wall abnormality. No acute or significant osseous findings. Review of the MIP images confirms the above findings. IMPRESSION: 1. No evidence of pulmonary embolism. 2. Patchy consolidation in the right upper lobe and to a lesser extent the right lower lobe, consistent with multifocal pneumonia. 3. Small right pleural effusion. 4. Unchanged dilated main and right pulmonary arteries, consistent with pulmonary arterial hypertension. 5. Aortic Atherosclerosis (ICD10-I70.0) and Emphysema (ICD10-J43.9). Electronically Signed   By: Titus Dubin M.D.   On: 08/20/2020 15:24        Scheduled Meds: . brimonidine  1 drop Right Eye TID  . fluticasone furoate-vilanterol  1 puff Inhalation Daily   And  . umeclidinium bromide  1 puff Inhalation Daily  . guaiFENesin  600 mg Oral BID   Continuous Infusions: . azithromycin 500 mg (08/21/20 1943)  . cefTRIAXone (ROCEPHIN)  IV Stopped (08/21/20 1853)  . lactated ringers 100 mL/hr at 08/22/20 0528     LOS: 2 days    Time spent: 35 mins,More than 50% of that time was spent in counseling and/or coordination of care.      Shelly Coss, MD [facility] P8/30/2021, 8:20 AM

## 2020-08-22 NOTE — Progress Notes (Signed)
Physical Therapy Treatment Patient Details Name: [name] MRN: [id number] DOB: [birth year] Today's Date: 08/22/2020    History of Present Illness 86 y.o. female with medical history significant of COPD, prior tobacco abuse who presents to the hospital with worsening shortness of breath and cough and found to have acute on chronic hypoxemic respiratory failure and COPD exacerbation. Patient uses 2 liter o2 at home and currently on 4 liters.    PT Comments    Progressing with mobility. O2: 88% on 2L [place] at rest, 88% on 3L [place] with ambulation. Pt fatigues fairly easily. Will continue to follow and progress activity as tolerated.    Follow Up Recommendations  Home health PT;Supervision - Intermittent     Equipment Recommendations  None recommended by PT    Recommendations for Other Services       Precautions / Restrictions Precautions Precautions: Fall Precaution Comments: O2 dep Restrictions Weight Bearing Restrictions: No    Mobility  Bed Mobility Overal bed mobility: Modified Independent                Transfers Overall transfer level: Needs assistance Equipment used: 1 person hand held assist;4-wheeled walker Transfers: Sit to/from Stand Sit to Stand: Min assist         General transfer comment: Assist to power up (especially from low toilet surface), steady, control descent. Cues for safety, hand placement. Unsteady.  Ambulation/Gait Ambulation/Gait assistance: Min guard Gait Distance (Feet): 135 Feet Assistive device: 4-wheeled walker Gait Pattern/deviations: Step-through pattern;Decreased stride length     General Gait Details: O2 88% on 3L [place], dyspnea 3/4.   Stairs             Wheelchair Mobility    Modified Rankin (Stroke Patients Only)       Balance Overall balance assessment: Needs assistance         Standing balance support: Bilateral upper extremity supported;Single extremity supported Standing balance-Leahy Scale:  Poor                              Cognition Arousal/Alertness: Awake/alert Behavior During Therapy: WFL for tasks assessed/performed Overall Cognitive Status: Within Functional Limits for tasks assessed                                        Exercises      General Comments        Pertinent Vitals/Pain Pain Assessment: No/denies pain    Home Living                      Prior Function            PT Goals (current goals can now be found in the care plan section) Progress towards PT goals: Progressing toward goals    Frequency    Min 3X/week      PT Plan Current plan remains appropriate    Co-evaluation              AM-PAC PT "6 Clicks" Mobility   Outcome Measure  Help needed turning from your back to your side while in a flat bed without using bedrails?: None Help needed moving from lying on your back to sitting on the side of a flat bed without using bedrails?: None Help needed moving to and from a bed to a chair (including a wheelchair)?:  A Little Help needed standing up from a chair using your arms (e.g., wheelchair or bedside chair)?: A Little Help needed to walk in hospital room?: A Little Help needed climbing 3-5 steps with a railing? : A Little 6 Click Score: 20    End of Session Equipment Utilized During Treatment: Gait belt;Oxygen Activity Tolerance: Patient tolerated treatment well Patient left: in bed;with call bell/phone within reach;with family/visitor present   PT Visit Diagnosis: Muscle weakness (generalized) (M62.81);Unsteadiness on feet (R26.81);Difficulty in walking, not elsewhere classified (R26.2)     Time: 7939-6886 PT Time Calculation (min) (ACUTE ONLY): 23 min  Charges:  $Gait Training: 23-37 mins                        Doreatha Massed, PT Acute Rehabilitation  Office: [phone] Pager: [phone]

## 2020-08-23 ENCOUNTER — Inpatient Hospital Stay (HOSPITAL_COMMUNITY): Payer: Medicare Other

## 2020-08-23 LAB — CBC WITH DIFFERENTIAL/PLATELET
Abs Immature Granulocytes: 0.24 10*3/uL — ABNORMAL HIGH (ref 0.00–0.07)
Basophils Absolute: 0 10*3/uL (ref 0.0–0.1)
Basophils Relative: 0 %
Eosinophils Absolute: 0 10*3/uL (ref 0.0–0.5)
Eosinophils Relative: 0 %
HCT: 31.9 % — ABNORMAL LOW (ref 36.0–46.0)
Hemoglobin: 9.7 g/dL — ABNORMAL LOW (ref 12.0–15.0)
Immature Granulocytes: 3 %
Lymphocytes Relative: 6 %
Lymphs Abs: 0.5 10*3/uL — ABNORMAL LOW (ref 0.7–4.0)
MCH: 28.3 pg (ref 26.0–34.0)
MCHC: 30.4 g/dL (ref 30.0–36.0)
MCV: 93 fL (ref 80.0–100.0)
Monocytes Absolute: 0.3 10*3/uL (ref 0.1–1.0)
Monocytes Relative: 3 %
Neutro Abs: 7.1 10*3/uL (ref 1.7–7.7)
Neutrophils Relative %: 88 %
Platelets: 299 10*3/uL (ref 150–400)
RBC: 3.43 MIL/uL — ABNORMAL LOW (ref 3.87–5.11)
RDW: 15.4 % (ref 11.5–15.5)
WBC: 8.1 10*3/uL (ref 4.0–10.5)
nRBC: 0 % (ref 0.0–0.2)

## 2020-08-23 LAB — LEGIONELLA PNEUMOPHILA SEROGP 1 UR AG: L. pneumophila Serogp 1 Ur Ag: NEGATIVE

## 2020-08-23 MED ORDER — GUAIFENESIN ER 600 MG PO TB12
1200.0000 mg | ORAL_TABLET | Freq: Two times a day (BID) | ORAL | Status: DC
  Administered 2020-08-23 – 2020-08-25 (×5): 1200 mg via ORAL
  Filled 2020-08-23 (×5): qty 2

## 2020-08-23 MED ORDER — PREDNISONE 20 MG PO TABS
40.0000 mg | ORAL_TABLET | Freq: Every day | ORAL | Status: DC
  Administered 2020-08-23 – 2020-08-25 (×3): 40 mg via ORAL
  Filled 2020-08-23 (×3): qty 2

## 2020-08-23 MED ORDER — SODIUM CHLORIDE 0.9 % IV SOLN
1.0000 g | INTRAVENOUS | Status: DC
  Administered 2020-08-23 – 2020-08-24 (×2): 1 g via INTRAVENOUS
  Filled 2020-08-23: qty 1
  Filled 2020-08-23: qty 10
  Filled 2020-08-23: qty 1

## 2020-08-23 MED ORDER — AZITHROMYCIN 250 MG PO TABS
500.0000 mg | ORAL_TABLET | Freq: Every day | ORAL | Status: AC
  Administered 2020-08-23 – 2020-08-24 (×2): 500 mg via ORAL
  Filled 2020-08-23 (×2): qty 2

## 2020-08-23 MED ORDER — ALPRAZOLAM 0.25 MG PO TABS
0.2500 mg | ORAL_TABLET | Freq: Three times a day (TID) | ORAL | Status: DC | PRN
  Administered 2020-08-23: 0.25 mg via ORAL
  Filled 2020-08-23 (×4): qty 1

## 2020-08-23 MED ORDER — CEFDINIR 300 MG PO CAPS
300.0000 mg | ORAL_CAPSULE | Freq: Two times a day (BID) | ORAL | Status: DC
  Administered 2020-08-23: 300 mg via ORAL
  Filled 2020-08-23: qty 1

## 2020-08-23 NOTE — Progress Notes (Signed)
PROGRESS NOTE    [name]  JJH:[id number] DOB: [birth year] DOA: 08/20/2020 PCP: Denita Lung, MD   Brief Narrative:  Patient is 86 year old female with history of COPD, tobacco abuse who presents to the emergency department complaints of worsening shortness of breath, cough.  She was recently diagnosed with community-acquired pneumonia and was treated with oral antibiotics and was also on a steroid taper but her symptoms did not improve and she presented to the emergency department.  On presentation, she was found to tachycardic with tachapnea.  Chest x-ray showed right upper and lower lobe pneumonia.  She was found to have leukocytosis.  Covid test negative.  Patient was started on IV antibiotics and admitted.   Assessment & Plan:   Principal Problem:   Sepsis due to pneumonia [facility]) Active Problems:   Former smoker   COPD GOLD III   Acute on chronic respiratory failure with hypoxia (HCC)   COPD exacerbation (HCC)   Acute respiratory failure with hypoxia: Secondary to pneumonia.  Requiring oxygen supplementation for maintenance of saturation.  Currently on 3-4 L of oxygen per minute.  On 2L  oxygen at home.    Community-acquired pneumonia: She failed outpatient antibiotic therapy.  Chest x-ray showed right upper/lower lobe pneumonia.  Covid test negative.    Started on ceftriaxone, azithromycin.  Negative urine streptococcal and pending  Legionella antigen.  Continue mucolytic's Chest x-ray done this morning showed persistent prominent right mid lung and right lung base infiltrates.New infiltrate left lung base.  But clinically she has improved.  COPD: Continue bronchodilators, supplemental oxygen.  She was initially started on IV Solu-Medrol, changed to oral now.  Debility/deconditioning: Patient lives alone.has  no residual functional impairment.  PT/OT recommended home health on discharge.          DVT prophylaxis: Lovenox Code Status: Full Family Communication:  Daughter present at the bedside Status is: Inpatient  Remains inpatient appropriate because:IV treatments appropriate due to intensity of illness or inability to take PO   Dispo: The patient is from: Home              Anticipated d/c is to: Home              Anticipated d/c date is 2-3 days              Patient currently is not medically stable to d/c. Elderly lady with COPD  with multifocal pneumonia.  I think we should give her plenty of time before safely discharging her back to home.  Patient lives alone.    Consultants: None  Procedures:None  Antimicrobials:  Anti-infectives (From admission, onward)   Start     Dose/Rate Route Frequency Ordered Stop   08/20/20 2000  azithromycin (ZITHROMAX) 500 mg in sodium chloride 0.9 % 250 mL IVPB        500 mg 250 mL/hr over 60 Minutes Intravenous Every 24 hours 08/20/20 1802     08/20/20 1830  cefTRIAXone (ROCEPHIN) 2 g in sodium chloride 0.9 % 100 mL IVPB        2 g 200 mL/hr over 30 Minutes Intravenous Every 24 hours 08/20/20 1802     08/20/20 1530  cefTRIAXone (ROCEPHIN) 1 g in sodium chloride 0.9 % 100 mL IVPB  Status:  Discontinued        1 g 200 mL/hr over 30 Minutes Intravenous  Once 08/20/20 1529 08/20/20 1802   08/20/20 1530  azithromycin (ZITHROMAX) 500 mg in sodium chloride 0.9 % 250 mL IVPB  Status:  Discontinued        500 mg 250 mL/hr over 60 Minutes Intravenous  Once 08/20/20 1529 08/20/20 1802      Subjective:  Patient seen and examined the bedside this morning.  Hemodynamically stable during my evaluation.  Daughter was present at the bedside.  Currently she is on 3 L of oxygen and maintaining her saturation on that.  She is also coughing but clinically she looks better than yesterday  Objective: Vitals:   08/22/20 1948 08/22/20 2230 08/23/20 0330 08/23/20 0813  BP: (!) 145/86 (!) 145/78 139/78 (!) 153/93  Pulse: (!) 101 100 86 90  Resp: 20 (!) 36 (!) 34 19  Temp: 98.6 F (37 C) 97.6 F (36.4 C) 97.6 F (36.4  C) 97.9 F (36.6 C)  TempSrc: Oral Oral Oral Oral  SpO2: 93% 96% 96% 90%  Weight:      Height:        Intake/Output Summary (Last 24 hours) at 08/23/2020 7124 Last data filed at 08/22/2020 2248 Gross per 24 hour  Intake 0 ml  Output 770 ml  Net -770 ml   Filed Weights   08/20/20 1108 08/20/20 1659  Weight: 61.2 kg 60.5 kg    Examination:  General exam: Pleasant elderly female, deconditioned, debilitated HEENT:PERRL,Oral mucosa moist, Ear/Nose normal on gross exam Respiratory system: Diminished air sounds on the right side no frank wheezes or crackles  cardiovascular system: S1 & S2 heard, RRR. No JVD, murmurs, rubs, gallops or clicks. Gastrointestinal system: Abdomen is nondistended, soft and nontender. No organomegaly or masses felt. Normal bowel sounds heard. Central nervous system: Alert and oriented. No focal neurological deficits. Extremities: No edema, no clubbing ,no cyanosis Skin: No rashes, lesions or ulcers,no icterus ,no pallor   Data Reviewed: I have personally reviewed following labs and imaging studies  CBC: Recent Labs  Lab 08/20/20 1300 08/20/20 1843 08/21/20 0503 08/22/20 0548 08/23/20 0500  WBC 19.8* 15.4* 13.4* 12.6* 8.1  NEUTROABS 16.5* 12.3*  --  10.1* 7.1  HGB 11.8* 11.3* 9.6* 9.3* 9.7*  HCT 37.1 35.6* 30.7* 30.6* 31.9*  MCV 91.2 92.5 92.5 94.4 93.0  PLT 316 320 278 253 580   Basic Metabolic Panel: Recent Labs  Lab 08/20/20 1300 08/20/20 1843 08/21/20 0503  NA 139 139 141  K 4.2 3.8 3.6  CL 101 101 102  CO2 27 27 27   GLUCOSE 129* 109* 101*  BUN 18 17 13   CREATININE 0.69 0.73 0.72  CALCIUM 8.9 8.6* 8.6*   GFR: Estimated Creatinine Clearance: 49.1 mL/min (by C-G formula based on SCr of 0.72 mg/dL). Liver Function Tests: Recent Labs  Lab 08/20/20 1300 08/20/20 1843 08/21/20 0503  AST 58* 45* 35  ALT 47* 39 33  ALKPHOS 85 82 72  BILITOT 0.8 0.7 0.9  PROT 7.6 7.0 6.0*  ALBUMIN 3.3* 3.0* 2.7*   No results for input(s):  LIPASE, AMYLASE in the last 168 hours. No results for input(s): AMMONIA in the last 168 hours. Coagulation Profile: Recent Labs  Lab 08/20/20 1843  INR 1.2   Cardiac Enzymes: No results for input(s): CKTOTAL, CKMB, CKMBINDEX, TROPONINI in the last 168 hours. BNP (last 3 results) No results for input(s): PROBNP in the last 8760 hours. HbA1C: No results for input(s): HGBA1C in the last 72 hours. CBG: No results for input(s): GLUCAP in the last 168 hours. Lipid Profile: No results for input(s): CHOL, HDL, LDLCALC, TRIG, CHOLHDL, LDLDIRECT in the last 72 hours. Thyroid Function Tests: No results for input(s):  TSH, T4TOTAL, FREET4, T3FREE, THYROIDAB in the last 72 hours. Anemia Panel: No results for input(s): VITAMINB12, FOLATE, FERRITIN, TIBC, IRON, RETICCTPCT in the last 72 hours. Sepsis Labs: Recent Labs  Lab 08/20/20 1843 08/20/20 1918 08/21/20 0503  LATICACIDVEN 1.1 2.3* 0.8    Recent Results (from the past 240 hour(s))  SARS Coronavirus 2 by RT PCR (hospital order, performed in [facility] hospital lab) Nasopharyngeal Nasopharyngeal Swab     Status: None   Collection Time: 08/20/20  1:00 PM   Specimen: Nasopharyngeal Swab  Result Value Ref Range Status   SARS Coronavirus 2 NEGATIVE NEGATIVE Final    Comment: (NOTE) SARS-CoV-2 target nucleic acids are NOT DETECTED.  The SARS-CoV-2 RNA is generally detectable in upper and lower respiratory specimens during the acute phase of infection. The lowest concentration of SARS-CoV-2 viral copies this assay can detect is 250 copies / mL. A negative result does not preclude SARS-CoV-2 infection and should not be used as the sole basis for treatment or other patient management decisions.  A negative result may occur with improper specimen collection / handling, submission of specimen other than nasopharyngeal swab, presence of viral mutation(s) within the areas targeted by this assay, and inadequate number of viral copies (<250  copies / mL). A negative result must be combined with clinical observations, patient history, and epidemiological information.  Fact Sheet for Patients:   StrictlyIdeas.no  Fact Sheet for Healthcare Providers: BankingDealers.co.za  This test is not yet approved or  cleared by the [place] FDA and has been authorized for detection and/or diagnosis of SARS-CoV-2 by FDA under an Emergency Use Authorization (EUA).  This EUA will remain in effect (meaning this test can be used) for the duration of the COVID-19 declaration under Section 564(b)(1) of the Act, 21 U.S.C. section 360bbb-3(b)(1), unless the authorization is terminated or revoked sooner.  Performed at [facility], [place] [address], [place], [place] [address]   Culture, blood (x 2)     Status: None (Preliminary result)   Collection Time: 08/20/20  6:40 PM   Specimen: BLOOD  Result Value Ref Range Status   Specimen Description   Final    BLOOD RIGHT ANTECUBITAL Performed at [place] [address] [place], [place] [address]    Special Requests   Final    BOTTLES DRAWN AEROBIC AND ANAEROBIC Blood Culture adequate volume Performed at [place] [address], [place], [place] [address]    Culture   Final    NO GROWTH 3 DAYS Performed at [facility], [place] [address], [place], [place] [address]    Report Status PENDING  Incomplete  Culture, blood (x 2)     Status: None (Preliminary result)   Collection Time: 08/20/20  7:18 PM   Specimen: BLOOD LEFT HAND  Result Value Ref Range Status   Specimen Description   Final    BLOOD LEFT HAND Performed at [place] [address] [place], [place] [address]    Special Requests   Final    BOTTLES DRAWN AEROBIC ONLY Blood Culture adequate volume Performed at [place] [address], [place], [place] [address]     Culture   Final    NO GROWTH 3 DAYS Performed at [facility], [place] [address], [place],  [address]    Report Status PENDING  Incomplete         Radiology Studies: No results found.  Scheduled Meds: . acidophilus  2 capsule Oral TID  . brimonidine  1 drop Right Eye TID  . fluticasone furoate-vilanterol  1 puff Inhalation Daily   And  . umeclidinium bromide  1 puff Inhalation Daily  . guaiFENesin  600 mg Oral BID  . methylPREDNISolone (SOLU-MEDROL) injection  40 mg Intravenous Q12H   Continuous Infusions: . azithromycin 500 mg (08/22/20 2119)  . cefTRIAXone (ROCEPHIN)  IV 2 g (08/22/20 1647)     LOS: 3 days    Time spent: 35 mins,More than 50% of that time was spent in counseling and/or coordination of care.      Shelly Coss, MD [facility] P8/31/2021, 8:36 AM

## 2020-08-23 NOTE — Progress Notes (Signed)
PHYSICAL THERAPY  Arrived to room just after 2pm pt just got back to bed and requested to rest.  "Been up all day" in recliner. Pt has been evaluated with rec for [place]  PTA Acute  Rehabilitation Services Pager      [phone] Office      [phone]

## 2020-08-23 NOTE — Progress Notes (Signed)
Occupational Therapy Treatment Patient Details Name: [name] MRN: [id number] DOB: 06/21/1932 Today's Date: 08/23/2020    History of present illness 86 y.o. female with medical history significant of COPD, prior tobacco abuse who presents to the hospital with worsening shortness of breath and cough and found to have acute on chronic hypoxemic respiratory failure and COPD exacerbation. Patient uses 2 liter o2 at home and currently on 4 liters.   OT comments  Pt agreed to OOB, using the 3 n1 , then getting to chair.  Daugther will provide 24/7 A at home upon DC  Follow Up Recommendations  No OT follow up    Equipment Recommendations  None recommended by OT    Recommendations for Other Services      Precautions / Restrictions Precautions Precautions: Fall Precaution Comments: O2 dep       Mobility Bed Mobility Overal bed mobility: Modified Independent                Transfers Overall transfer level: Needs assistance Equipment used: 1 person hand held assist;4-wheeled walker Transfers: Sit to/from Stand Sit to Stand: Min assist Stand pivot transfers: Min assist            Balance Overall balance assessment: Needs assistance         Standing balance support: Bilateral upper extremity supported;Single extremity supported Standing balance-Leahy Scale: Poor                             ADL either performed or assessed with clinical judgement   ADL Overall ADL's : Needs assistance/impaired                         Toilet Transfer: Min guard;Stand-pivot;BSC   Toileting- Clothing Manipulation and Hygiene: Min guard;Sit to/from stand;Set up       Functional mobility during ADLs: Minimal assistance;Rolling walker General ADL Comments: pt agreed to sit in chair after using 3 n 1     Vision Patient Visual Report: No change from baseline            Cognition Arousal/Alertness: Awake/alert Behavior During Therapy: WFL for  tasks assessed/performed Overall Cognitive Status: Within Functional Limits for tasks assessed                                                     Pertinent Vitals/ Pain       Pain Assessment: No/denies pain         Frequency  Min 2X/week        Progress Toward Goals  OT Goals(current goals can now be found in the care plan section)  Progress towards OT goals: Progressing toward goals     Plan Discharge plan remains appropriate    Co-evaluation                 AM-PAC OT "6 Clicks" Daily Activity     Outcome Measure   Help from another person eating meals?: None Help from another person taking care of personal grooming?: A Little Help from another person toileting, which includes using toliet, bedpan, or urinal?: A Little Help from another person bathing (including washing, rinsing, drying)?: A Little Help from another person to put on and taking off regular upper body clothing?: A Little  Help from another person to put on and taking off regular lower body clothing?: A Little 6 Click Score: 19    End of Session Equipment Utilized During Treatment: Oxygen  OT Visit Diagnosis: Muscle weakness (generalized) (M62.81)   Activity Tolerance Patient tolerated treatment well   Patient Left with family/visitor present;in chair   Nurse Communication Mobility status        Time: 4163-8453 OT Time Calculation (min): 21 min  Charges: OT General Charges $OT Visit: 1 Visit OT Treatments $Self Care/Home Management : 8-22 mins  Kari Baars, [place] [phone] Office- [phone]      Xyon Lukasik, Edwena Felty D 08/23/2020, 4:12 PM

## 2020-08-23 NOTE — Progress Notes (Signed)
Pt resting more comfortably after Xanax given this am, less anxious and breathing is decreased. HR improved. Daughter at bedside. Will cont to monitor. SRP, RN

## 2020-08-24 MED ORDER — ENOXAPARIN SODIUM 40 MG/0.4ML ~~LOC~~ SOLN
40.0000 mg | SUBCUTANEOUS | Status: DC
  Administered 2020-08-24: 40 mg via SUBCUTANEOUS
  Filled 2020-08-24: qty 0.4

## 2020-08-24 NOTE — Care Management Important Message (Signed)
Important Message  Patient Details IM Letter given to the Patient Name: [name] MRN: [id number] Date of Birth: 15-Nov-1932   Medicare Important Message Given:  Yes     Kerin Salen 08/24/2020, 12:24 PM

## 2020-08-24 NOTE — Progress Notes (Signed)
PROGRESS NOTE    [name]  VPX:[id number] DOB: [birth year] DOA: 08/20/2020 PCP: Denita Lung, MD   Chief Complaint  Patient presents with  . Shortness of Breath    Brief Narrative: 86 year old female with history of COPD chronic hypoxic respiratory failure on 2 L nasal cannula, tobacco abuse presented with worsening shortness of breath cough.  Recently diagnosed with community-acquired pneumonia and was treated with oral antibiotics also a steroid taper but did not improve she seen in the ED.  On presentation patient was found to be having pneumonia and acute on chronic hypoxic respiratory failure Covid negative and was started on IV antibiotics and admitted  Subjective: This morning daughter at the bedside.  Patient work with physical therapy and was able to ambulate more distance but needing 3 L oxygen and was short of breath with ambulation.   Assessment & Plan:  Multifocal pneumonia community-acquired, failed outpatient oral antibiotic.  Clinically improving afebrile, WBC count is stabilized.  Continue on IV ceftriaxone, azithromycin, continue prednisone.  Covid is negative.  Chest x-ray shows persistent multifocal pneumonia but will need follow-up chest x-ray in 4 weeks.  Clinical improvement  Sepsis present on admission due to pneumonia with lactic acidosis tachypnea and leukocytosis.  Overall improved.  Hemodynamically stable  Debility/deconditioning continue PT OT.  Patient will be going with her daughter post discharge.  Acute on chronic respiratory failure with hypoxia secondary to pneumonia currently on 3 L at home on 2 L continue to wean down oxygen.  COPD with mild exacerbation continue oral prednisone supplemental oxygen bronchodilators.  Anemia likely from chronic disease hemoglobin 9 to 11 g.  Monitor.  DVT prophylaxis: enoxaparin (LOVENOX) injection 40 mg Start: 08/24/20 1300 Code Status:   Code Status: Full Code Family Communication: plan of care  discussed with patient and her daughter at bedside. Status is: Inpatient  Remains inpatient appropriate because:Inpatient level of care appropriate due to severity of illness and For ongoing IV antibiotics and clinical stabilization of pneumonia that has failed outpatient therapy   Dispo: The patient is from: Home              Anticipated d/c is to: Home              Anticipated d/c date is: 1 day              Patient currently is not medically stable to d/c.  Nutrition: Diet Order            Diet regular Room service appropriate? Yes; Fluid consistency: Thin  Diet effective now                 Body mass index is 20.89 kg/m.  Consultants:see note  Procedures:see note Microbiology:see note Blood Culture    Component Value Date/Time   SDES  08/20/2020 1918    BLOOD LEFT HAND Performed at [facility], [place] [address], [place], [place] [address]    SPECREQUEST  08/20/2020 1918    BOTTLES DRAWN AEROBIC ONLY Blood Culture adequate volume Performed at [facility] [address], [place], [place] [address]    CULT  08/20/2020 1918    NO GROWTH 4 DAYS Performed at [facility] [address] [place], [place] [address]    REPTSTATUS PENDING 08/20/2020 1918    Other culture-see note  Medications: Scheduled Meds: . acidophilus  2 capsule Oral TID  . brimonidine  1 drop Right Eye TID  . enoxaparin (LOVENOX) injection  40 mg Subcutaneous Q24H  . fluticasone furoate-vilanterol  1 puff Inhalation Daily   And  . umeclidinium bromide  1 puff Inhalation Daily  . guaiFENesin  1,200 mg Oral BID  . predniSONE  40 mg Oral Q breakfast   Continuous Infusions: . cefTRIAXone (ROCEPHIN)  IV 1 g (08/23/20 1807)    Antimicrobials: Anti-infectives (From admission, onward)   Start     Dose/Rate Route Frequency Ordered Stop   08/23/20 1600  cefTRIAXone (ROCEPHIN) 1 g in sodium chloride 0.9 % 100 mL IVPB        1 g 200 mL/hr over 30  Minutes Intravenous Every 24 hours 08/23/20 1331 08/27/20 1559   08/23/20 1115  cefdinir (OMNICEF) capsule 300 mg  Status:  Discontinued        300 mg Oral Every 12 hours 08/23/20 1017 08/23/20 1331   08/23/20 1016  azithromycin (ZITHROMAX) tablet 500 mg        500 mg Oral Daily 08/23/20 1017 08/24/20 1147   08/20/20 2000  azithromycin (ZITHROMAX) 500 mg in sodium chloride 0.9 % 250 mL IVPB  Status:  Discontinued        500 mg 250 mL/hr over 60 Minutes Intravenous Every 24 hours 08/20/20 1802 08/23/20 1017   08/20/20 1830  cefTRIAXone (ROCEPHIN) 2 g in sodium chloride 0.9 % 100 mL IVPB  Status:  Discontinued        2 g 200 mL/hr over 30 Minutes Intravenous Every 24 hours 08/20/20 1802 08/23/20 1017   08/20/20 1530  cefTRIAXone (ROCEPHIN) 1 g in sodium chloride 0.9 % 100 mL IVPB  Status:  Discontinued        1 g 200 mL/hr over 30 Minutes Intravenous  Once 08/20/20 1529 08/20/20 1802   08/20/20 1530  azithromycin (ZITHROMAX) 500 mg in sodium chloride 0.9 % 250 mL IVPB  Status:  Discontinued        500 mg 250 mL/hr over 60 Minutes Intravenous  Once 08/20/20 1529 08/20/20 1802     Objective: Vitals: Today's Vitals   08/23/20 2155 08/23/20 2230 08/24/20 0458 08/24/20 0912  BP: (!) 145/87  (!) 145/74   Pulse: 86  73   Resp: 20  20   Temp: 98.2 F (36.8 C)  97.7 F (36.5 C)   TempSrc: Oral  Oral   SpO2: 98%  93% 95%  Weight:      Height:      PainSc:  0-No pain      Intake/Output Summary (Last 24 hours) at 08/24/2020 1205 Last data filed at 08/24/2020 0930 Gross per 24 hour  Intake 480 ml  Output --  Net 480 ml   Filed Weights   08/20/20 1108 08/20/20 1659  Weight: 61.2 kg 60.5 kg   Weight change:   Intake/Output from previous day: No intake/output data recorded. Intake/Output this shift: Total I/O In: 480 [P.O.:480] Out: -  Examination: General exam: AAO, elderly frail not in acute distress HEENT:Oral mucosa moist, Ear/Nose WNL grossly,dentition normal. Respiratory  system: bilaterally mild wheezing, no crackles no use of accessory muscle, non tender. Cardiovascular system: S1 & S2 +, regular, No JVD. Gastrointestinal system: Abdomen soft, NT,ND, BS+. Nervous System:Alert, awake, moving extremities and grossly nonfocal Extremities: No edema, distal peripheral pulses palpable.  Skin: No rashes,no icterus. MSK: Normal muscle bulk,tone, power  Data Reviewed: I have personally reviewed following labs and imaging studies CBC: Recent Labs  Lab 08/20/20 1300 08/20/20 1843 08/21/20 0503 08/22/20 0548 08/23/20 0500  WBC 19.8* 15.4* 13.4* 12.6*  8.1  NEUTROABS 16.5* 12.3*  --  10.1* 7.1  HGB 11.8* 11.3* 9.6* 9.3* 9.7*  HCT 37.1 35.6* 30.7* 30.6* 31.9*  MCV 91.2 92.5 92.5 94.4 93.0  PLT 316 320 278 253 856   Basic Metabolic Panel: Recent Labs  Lab 08/20/20 1300 08/20/20 1843 08/21/20 0503  NA 139 139 141  K 4.2 3.8 3.6  CL 101 101 102  CO2 27 27 27   GLUCOSE 129* 109* 101*  BUN 18 17 13   CREATININE 0.69 0.73 0.72  CALCIUM 8.9 8.6* 8.6*   GFR: Estimated Creatinine Clearance: 49.1 mL/min (by C-G formula based on SCr of 0.72 mg/dL). Liver Function Tests: Recent Labs  Lab 08/20/20 1300 08/20/20 1843 08/21/20 0503  AST 58* 45* 35  ALT 47* 39 33  ALKPHOS 85 82 72  BILITOT 0.8 0.7 0.9  PROT 7.6 7.0 6.0*  ALBUMIN 3.3* 3.0* 2.7*   No results for input(s): LIPASE, AMYLASE in the last 168 hours. No results for input(s): AMMONIA in the last 168 hours. Coagulation Profile: Recent Labs  Lab 08/20/20 1843  INR 1.2   Cardiac Enzymes: No results for input(s): CKTOTAL, CKMB, CKMBINDEX, TROPONINI in the last 168 hours. BNP (last 3 results) No results for input(s): PROBNP in the last 8760 hours. HbA1C: No results for input(s): HGBA1C in the last 72 hours. CBG: No results for input(s): GLUCAP in the last 168 hours. Lipid Profile: No results for input(s): CHOL, HDL, LDLCALC, TRIG, CHOLHDL, LDLDIRECT in the last 72 hours. Thyroid Function  Tests: No results for input(s): TSH, T4TOTAL, FREET4, T3FREE, THYROIDAB in the last 72 hours. Anemia Panel: No results for input(s): VITAMINB12, FOLATE, FERRITIN, TIBC, IRON, RETICCTPCT in the last 72 hours. Sepsis Labs: Recent Labs  Lab 08/20/20 1843 08/20/20 1918 08/21/20 0503  LATICACIDVEN 1.1 2.3* 0.8    Recent Results (from the past 240 hour(s))  SARS Coronavirus 2 by RT PCR (hospital order, performed in [facility] hospital lab) Nasopharyngeal Nasopharyngeal Swab     Status: None   Collection Time: 08/20/20  1:00 PM   Specimen: Nasopharyngeal Swab  Result Value Ref Range Status   SARS Coronavirus 2 NEGATIVE NEGATIVE Final    Comment: (NOTE) SARS-CoV-2 target nucleic acids are NOT DETECTED.  The SARS-CoV-2 RNA is generally detectable in upper and lower respiratory specimens during the acute phase of infection. The lowest concentration of SARS-CoV-2 viral copies this assay can detect is 250 copies / mL. A negative result does not preclude SARS-CoV-2 infection and should not be used as the sole basis for treatment or other patient management decisions.  A negative result may occur with improper specimen collection / handling, submission of specimen other than nasopharyngeal swab, presence of viral mutation(s) within the areas targeted by this assay, and inadequate number of viral copies (<250 copies / mL). A negative result must be combined with clinical observations, patient history, and epidemiological information.  Fact Sheet for Patients:   StrictlyIdeas.no  Fact Sheet for Healthcare Providers: BankingDealers.co.za  This test is not yet approved or  cleared by the [place] FDA and has been authorized for detection and/or diagnosis of SARS-CoV-2 by FDA under an Emergency Use Authorization (EUA).  This EUA will remain in effect (meaning this test can be used) for the duration of the COVID-19 declaration under Section  564(b)(1) of the Act, 21 U.S.C. section 360bbb-3(b)(1), unless the authorization is terminated or revoked sooner.  Performed at [facility], [place] [address], [place], [place] [address]   Culture, blood (  x 2)     Status: None (Preliminary result)   Collection Time: 08/20/20  6:40 PM   Specimen: BLOOD  Result Value Ref Range Status   Specimen Description   Final    BLOOD RIGHT ANTECUBITAL Performed at [facility] [address], [place], [place] [address]    Special Requests   Final    BOTTLES DRAWN AEROBIC AND ANAEROBIC Blood Culture adequate volume Performed at [facility] [address] [place], [place] [address]    Culture   Final    NO GROWTH 4 DAYS Performed at [facility], [place] [address], [place], [place] [address]    Report Status PENDING  Incomplete  Culture, blood (x 2)     Status: None (Preliminary result)   Collection Time: 08/20/20  7:18 PM   Specimen: BLOOD LEFT HAND  Result Value Ref Range Status   Specimen Description   Final    BLOOD LEFT HAND Performed at [facility] [address] [place], [place] [address]    Special Requests   Final    BOTTLES DRAWN AEROBIC ONLY Blood Culture adequate volume Performed at [facility] [address], [place], [place] [address]    Culture   Final    NO GROWTH 4 DAYS Performed at [facility], [place] [address] [place], [place] [address]    Report Status PENDING  Incomplete     Radiology Studies: DG CHEST PORT 1 VIEW  Result Date: 08/23/2020 CLINICAL DATA:  COPD.  Shortness of breath.  Cough.  Pneumonia. EXAM: PORTABLE CHEST 1 VIEW COMPARISON:  08/20/2020.  CT 08/20/2020. FINDINGS: Mediastinum hilar structures normal. Persistent prominent right mid lung and right lung base infiltrates. New infiltrate left lung base. Findings consistent multifocal pneumonia. Small bilateral pleural effusions. Cardiomegaly. No  pulmonary venous congestion. IMPRESSION: Persistent prominent right mid lung and right lung base infiltrates. New infiltrate left lung base. Findings consistent with multifocal pneumonia. Small bilateral pleural effusions. Electronically Signed   By: Marcello Moores  Register   On: 08/23/2020 12:47     LOS: 4 days   Antonieta Pert, MD [facility]  08/24/2020, 12:05 PM

## 2020-08-24 NOTE — Progress Notes (Signed)
Pt currently on O2 at 2.5 liters at 93%. Earlier in shift attempted to wean O2 down at pt home baseline of 2 liters. Pt on 2 liters for approx. 40 minutes, noticed O2 sats decreased to 85 to 88% on 2 liters, noticed breathing increased and HR in the 100's. Oxygen increased back to 2.5 liters pt sat remain 91-93%. Pt tolerated 2.5 liter better with even breathing and maintain HR in the 90's. Will cont to monitor.  SRP, RN

## 2020-08-24 NOTE — Progress Notes (Signed)
Physical Therapy Treatment Patient Details Name: [name] MRN: [id number] DOB: [birth year] Today's Date: 08/24/2020    History of Present Illness 86 y.o. female with medical history significant of COPD, prior tobacco abuse who presents to the hospital with worsening shortness of breath and cough and found to have acute on chronic hypoxemic respiratory failure and COPD exacerbation. Patient uses 2 liter o2 at home and currently on 4 liters.    PT Comments    Pt in bed on 3 lts nasal at 94%.  Assisted with amb in hallway using a rollator and oxygen.  General Gait Details: pt required 3 lts during activity to achieve sats above 90%.  Pt tolerated an increased distance but present with 3/4 dyspnea.  Per daughter, improving not performing as well as she did at home.    Follow Up Recommendations  Home health PT;Supervision - Intermittent     Equipment Recommendations  None recommended by PT    Recommendations for Other Services       Precautions / Restrictions Precautions Precautions: Fall Precaution Comments: O2 dep 2 lts    Mobility  Bed Mobility Overal bed mobility: Modified Independent             General bed mobility comments: increased time and extended seated rest break due to dyspnea  Transfers Overall transfer level: Needs assistance Equipment used: 1 person hand held assist;4-wheeled walker Transfers: Sit to/from Stand;Stand Pivot Transfers Sit to Stand: Supervision Stand pivot transfers: Supervision;Min guard       General transfer comment: good safety cognition and use of hands to support self  Ambulation/Gait Ambulation/Gait assistance: Supervision;Min guard Gait Distance (Feet): 150 Feet (75 feet x 2 one seated rest break) Assistive device: 4-wheeled walker Gait Pattern/deviations: Step-through pattern;Decreased stride length Gait velocity: decreased   General Gait Details: pt required 3 lts during activity to achieve sats above 90%.  Pt  tolerated an increased distance but present with 3/4 dyspnea.  Per daughter, improving not performing as well as she did at home   Stairs             Wheelchair Mobility    Modified Rankin (Stroke Patients Only)       Balance                                            Cognition Arousal/Alertness: Awake/alert Behavior During Therapy: WFL for tasks assessed/performed Overall Cognitive Status: Within Functional Limits for tasks assessed                                 General Comments: AxO x 3 pleasant and motivated      Exercises      General Comments        Pertinent Vitals/Pain Pain Assessment: No/denies pain    Home Living                      Prior Function            PT Goals (current goals can now be found in the care plan section) Progress towards PT goals: Progressing toward goals    Frequency    Min 3X/week      PT Plan Current plan remains appropriate    Co-evaluation  AM-PAC PT "6 Clicks" Mobility   Outcome Measure  Help needed turning from your back to your side while in a flat bed without using bedrails?: None Help needed moving from lying on your back to sitting on the side of a flat bed without using bedrails?: None Help needed moving to and from a bed to a chair (including a wheelchair)?: None Help needed standing up from a chair using your arms (e.g., wheelchair or bedside chair)?: None Help needed to walk in hospital room?: A Little Help needed climbing 3-5 steps with a railing? : A Little 6 Click Score: 22    End of Session Equipment Utilized During Treatment: Gait belt;Oxygen Activity Tolerance: Other (comment) (dyspnea) Patient left: in chair;with call bell/phone within reach;with family/visitor present   PT Visit Diagnosis: Muscle weakness (generalized) (M62.81);Unsteadiness on feet (R26.81);Difficulty in walking, not elsewhere classified (R26.2)     Time:  8341-9622 PT Time Calculation (min) (ACUTE ONLY): 26 min  Charges:  $Gait Training: 8-22 mins $Therapeutic Activity: 8-22 mins                     Rica Koyanagi  PTA Acute  Rehabilitation Services Pager      [phone] Office      [phone]

## 2020-08-25 LAB — CULTURE, BLOOD (ROUTINE X 2)
Culture: NO GROWTH
Culture: NO GROWTH
Special Requests: ADEQUATE
Special Requests: ADEQUATE

## 2020-08-25 MED ORDER — RISAQUAD PO CAPS: 2.0000 | ORAL_CAPSULE | Freq: Three times a day (TID) | ORAL | 0 refills | Status: AC

## 2020-08-25 MED ORDER — PREDNISONE 10 MG PO TABS: ORAL_TABLET | ORAL | 0 refills | Status: DC

## 2020-08-25 MED ORDER — GUAIFENESIN ER 600 MG PO TB12: 1200.0000 mg | ORAL_TABLET | Freq: Two times a day (BID) | ORAL | 0 refills | Status: AC

## 2020-08-25 MED ORDER — ALPRAZOLAM 0.25 MG PO TABS: 0.1250 mg | ORAL_TABLET | Freq: Every day | ORAL | 0 refills | Status: DC | PRN

## 2020-08-25 MED ORDER — CEFDINIR 300 MG PO CAPS: 300.0000 mg | ORAL_CAPSULE | Freq: Two times a day (BID) | ORAL | 0 refills | Status: AC

## 2020-08-25 NOTE — Progress Notes (Signed)
PHYSICAL THERAPY  SATURATION QUALIFICATIONS: (This note is used to comply with regulatory documentation for home oxygen)  Patient Saturations on Room Air at Rest = 87%  Patient Saturations on Room Air while Ambulating (unable to tolerate RA with activity)   Patient Saturations on 2 Liters of oxygen while Ambulating 75 feet = 86%  Patient Saturations on 3 Liters of oxygen while Ambulating 75 feet = 90%  Please briefly explain why patient needs home oxygen:Pt requires continuous oxygen of 2 lts at rest and 3 lts with activity.  Rica Koyanagi  PTA Acute  Rehabilitation Services Pager      [phone] Office      [phone]

## 2020-08-25 NOTE — Discharge Summary (Signed)
Physician Discharge Summary  [name] WJX:[id number] DOB: [birth year] DOA: 08/20/2020  PCP: Denita Lung, MD  Admit date: 08/20/2020 Discharge date: 08/25/2020  Admitted From: home Disposition:  [place]  Recommendations for Outpatient Follow-up:  1. Follow up with PCP in 1-2 weeks 2. Please obtain BMP/CBC in one week 3. Please follow up on the following pending results:  Home Health:Yes  Equipment/Devices: none  Discharge Condition: Stable Code Status:   Code Status: Full Code Diet recommendation:  Diet Order            Diet - low sodium heart healthy           Diet regular Room service appropriate? Yes; Fluid consistency: Thin  Diet effective now                  Brief/Interim Summary: 86 year old female with history of COPD chronic hypoxic respiratory failure on 2 L nasal cannula, tobacco abuse presented with worsening shortness of breath cough.  Recently diagnosed with community-acquired pneumonia and was treated with oral antibiotics also a steroid taper but did not improve she seen in the ED.  On presentation patient was found to be having pneumonia and acute on chronic hypoxic respiratory failure Covid negative and was started on IV antibiotics and admitted. Patient was managed for multifocal pneumonia community-acquired pneumonia with IV ceftriaxone and azithromycin completed 5 days of erythromycin.  Deconditioned weak and frail seen by PT OT. Patient at this time will be needing 3 L nasal cannula with which is able to ambulate 75 feet with physical therapy did pretty well.  Patient feels comfortable going home today, with daughter at the bedside and daughter will be helping her, will be arranging home health PT OT.  Discharge Diagnoses:  Principal Problem:   Sepsis due to pneumonia [facility]) Active Problems:   Former smoker   COPD GOLD III   Acute on chronic respiratory failure with hypoxia ([place])   COPD exacerbation ([place])  Multifocal pneumonia community-acquired,  failed outpatient oral antibiotic.  Clinically improved, afebrile, WBC count is improved.  She will be discharged on oral cefdinir, completed 5 days of azithromycin, will taper her prednisone over the next few days.  She will continue inhaler, she will need follow-up chest x-ray in 4 weeks discussed the daughter  Sepsis present on admission due to pneumonia with lactic acidosis tachypnea and leukocytosis.  Overall improved. stable.  Debility/deconditioning continue PT OT.Patient will be going with her daughter post discharge.  Acute on chronic respiratory failure with hypoxia secondary to pneumonia currently on2.5- 3 L  COPD with mild exacerbation continue oral prednisone taper, home inhalers  Anemia likely from chronic disease hemoglobin is stable  Anxiety disorder:Patient endorses  Significant has improvement with low-dose Xanax, daughter requesting 0.125 mg as needed basis and she will follow up with PCP. Subjective:  Seen this morning feels pretty well.  No nausea vomiting fever chills.  She ambulated and did well at this time will be needing 3 to nasal cannula given her pneumonia hopefully can wean down to home oxygen of 2 L in few weeks.  Discharge Exam: Vitals:   08/25/20 1303 08/25/20 1318  BP: (!) 163/89 (!) 183/85  Pulse: 86 85  Resp: (!) 28 (!) 21  Temp: 97.7 F (36.5 C)   SpO2: 94% 94%   General: Pt is alert, awake, not in acute distress Cardiovascular: RRR, S1/S2 +, no rubs, no gallops Respiratory: CTA bilaterally, no wheezing, no rhonchi Abdominal: Soft, NT, ND, bowel sounds + Extremities:  no edema, no cyanosis  Discharge Instructions  Discharge Instructions    Diet - low sodium heart healthy   Complete by: As directed    Discharge instructions   Complete by: As directed    Please call call MD or return to ER for similar or worsening recurring problem that brought you to hospital or if any fever,nausea/vomiting,abdominal pain, uncontrolled pain, chest pain,   shortness of breath or any other alarming symptoms.  Please follow-up your doctor as instructed in a week time and call the office for appointment. Have a follow-up chest x-ray in 4 weeks  Please avoid alcohol, smoking, or any other illicit substance and maintain healthy habits including taking your regular medications as prescribed.  You were cared for by a hospitalist during your hospital stay. If you have any questions about your discharge medications or the care you received while you were in the hospital after you are discharged, you can call the unit and ask to speak with the hospitalist on call if the hospitalist that took care of you is not available.  Once you are discharged, your primary care physician will handle any further medical issues. Please note that NO REFILLS for any discharge medications will be authorized once you are discharged, as it is imperative that you return to your primary care physician (or establish a relationship with a primary care physician if you do not have one) for your aftercare needs so that they can reassess your need for medications and monitor your lab values   Increase activity slowly   Complete by: As directed      Allergies as of 08/25/2020      Reactions   Augmentin [amoxicillin-pot Clavulanate] Nausea And Vomiting   Levofloxacin Other (See Comments)   Lowered blood pressre      Medication List    STOP taking these medications   doxycycline 100 MG tablet Commonly known as: VIBRA-TABS     TAKE these medications   acetaminophen 650 MG CR tablet Commonly known as: TYLENOL Take 650 mg by mouth every 8 (eight) hours as needed for pain.   acidophilus Caps capsule Take 2 capsules by mouth 3 (three) times daily for 7 days.   albuterol 108 (90 Base) MCG/ACT inhaler Commonly known as: VENTOLIN HFA Inhale 2 puffs into the lungs every 4 (four) hours as needed for wheezing or shortness of breath (((PLAN A))). Reported on 01/12/2016   ALPRAZolam  0.25 MG tablet Commonly known as: XANAX Take 0.5 tablets (0.125 mg total) by mouth daily as needed for up to 10 doses for anxiety.   brimonidine 0.2 % ophthalmic solution Commonly known as: ALPHAGAN Place 1 drop into the right eye 3 (three) times daily.   cefdinir 300 MG capsule Commonly known as: OMNICEF Take 1 capsule (300 mg total) by mouth 2 (two) times daily for 5 days.   dextromethorphan-guaiFENesin 30-600 MG 12hr tablet Commonly known as: MUCINEX DM Take 1 tablet by mouth at bedtime as needed (with flutter for cough, congestion, and thick mucus).   guaiFENesin 600 MG 12 hr tablet Commonly known as: MUCINEX Take 2 tablets (1,200 mg total) by mouth 2 (two) times daily for 7 days.   OXYGEN Use 2L of O2 continuously   predniSONE 10 MG tablet Commonly known as: DELTASONE 3 tabs daily x3 days,2 tabs daily x3 days then 1 tab daily x3 days then stop What changed: additional instructions   Trelegy Ellipta 100-62.5-25 MCG/INH Aepb Generic drug: Fluticasone-Umeclidin-Vilant Inhale 1 puff into the lungs  every morning.   Vitamin D 50 MCG (2000 UT) Caps Take 1 capsule by mouth daily.       Follow-up Information    Denita Lung, MD Follow up in 1 week(s).   Specialty: Family Medicine Contact information: [address] [place] [address] [phone]              Allergies  Allergen Reactions  . Augmentin [Amoxicillin-Pot Clavulanate] Nausea And Vomiting  . Levofloxacin Other (See Comments)    Lowered blood pressre    The results of significant diagnostics from this hospitalization (including imaging, microbiology, ancillary and laboratory) are listed below for reference.    Microbiology: Recent Results (from the past 240 hour(s))  SARS Coronavirus 2 by RT PCR (hospital order, performed in [facility] hospital lab) Nasopharyngeal Nasopharyngeal Swab     Status: None   Collection Time: 08/20/20  1:00 PM   Specimen: Nasopharyngeal Swab  Result  Value Ref Range Status   SARS Coronavirus 2 NEGATIVE NEGATIVE Final    Comment: (NOTE) SARS-CoV-2 target nucleic acids are NOT DETECTED.  The SARS-CoV-2 RNA is generally detectable in upper and lower respiratory specimens during the acute phase of infection. The lowest concentration of SARS-CoV-2 viral copies this assay can detect is 250 copies / mL. A negative result does not preclude SARS-CoV-2 infection and should not be used as the sole basis for treatment or other patient management decisions.  A negative result may occur with improper specimen collection / handling, submission of specimen other than nasopharyngeal swab, presence of viral mutation(s) within the areas targeted by this assay, and inadequate number of viral copies (<250 copies / mL). A negative result must be combined with clinical observations, patient history, and epidemiological information.  Fact Sheet for Patients:   StrictlyIdeas.no  Fact Sheet for Healthcare Providers: BankingDealers.co.za  This test is not yet approved or  cleared by the [place] FDA and has been authorized for detection and/or diagnosis of SARS-CoV-2 by FDA under an Emergency Use Authorization (EUA).  This EUA will remain in effect (meaning this test can be used) for the duration of the COVID-19 declaration under Section 564(b)(1) of the Act, 21 U.S.C. section 360bbb-3(b)(1), unless the authorization is terminated or revoked sooner.  Performed at [facility], [place] [address], [place], [place] [address]   Culture, blood (x 2)     Status: None   Collection Time: 08/20/20  6:40 PM   Specimen: BLOOD  Result Value Ref Range Status   Specimen Description   Final    BLOOD RIGHT ANTECUBITAL Performed at [facility] [address] [place], [place] [address]    Special Requests   Final    BOTTLES DRAWN AEROBIC AND ANAEROBIC Blood Culture adequate  volume Performed at [place] [address] [place], [place] [address]    Culture   Final    NO GROWTH 5 DAYS Performed at [facility], [place] [address], [place], [place] [address]    Report Status 08/25/2020 FINAL  Final  Culture, blood (x 2)     Status: None   Collection Time: 08/20/20  7:18 PM   Specimen: BLOOD LEFT HAND  Result Value Ref Range Status   Specimen Description   Final    BLOOD LEFT HAND Performed at [place] [address], [place],  [address]    Special Requests   Final    BOTTLES DRAWN AEROBIC ONLY Blood Culture adequate volume  Performed at [facility], [place] [address] [place], [place] [address]    Culture   Final    NO GROWTH 5 DAYS Performed at [facility], [place] [address], [place], [place] [address]    Report Status 08/25/2020 FINAL  Final    Procedures/Studies: DG Chest 2 View  Result Date: 08/20/2020 CLINICAL DATA:  COPD.  Cough and lung pain. EXAM: CHEST - 2 VIEW COMPARISON:  05/08/2016 FINDINGS: Normal cardiomediastinal contours. Aortic atherosclerotic calcifications noted. The lungs are hyperinflated and there are coarsened interstitial changes compatible with emphysema. Dense airspace consolidation is identified within the posterior right upper lobe. A second opacity is identified within the posterior right lung base. Left lung clear. IMPRESSION: 1. Right upper and lower lobe pneumonia. Followup PA and lateral chest X-ray is recommended in 3-4 weeks following trial of antibiotic therapy to ensure resolution and exclude underlying malignancy. 2. Emphysema. Electronically Signed   By: Kerby Moors M.D.   On: 08/20/2020 11:47   CT Angio Chest PE W/Cm &/Or Wo Cm  Result Date: 08/20/2020 CLINICAL DATA:  Right-sided chest pain. Shortness of breath. History of COPD. EXAM: CT ANGIOGRAPHY CHEST WITH CONTRAST TECHNIQUE: Multidetector CT imaging of the chest was performed using  the standard protocol during bolus administration of intravenous contrast. Multiplanar CT image reconstructions and MIPs were obtained to evaluate the vascular anatomy. CONTRAST:  70mL OMNIPAQUE IOHEXOL 350 MG/ML SOLN COMPARISON:  Chest x-ray from same day. CTA chest dated May 06, 2013. FINDINGS: Cardiovascular: Satisfactory opacification of the pulmonary arteries to the segmental level. No evidence of pulmonary embolism. Unchanged dilated main and right pulmonary arteries measuring up to 3.9 cm diameter. Normal heart size. No pericardial effusion. No thoracic aortic aneurysm. Coronary, aortic arch, and branch vessel atherosclerotic vascular disease. Mediastinum/Nodes: Mildly enlarged right hilar lymph node measuring 1.3 cm in short axis (series 5, image 76), likely reactive. No additional enlarged mediastinal, hilar, or axillary lymph nodes. The thyroid gland, trachea, and esophagus demonstrate no significant findings. Lungs/Pleura: Severe centrilobular emphysema. Patchy consolidation in the right upper lobe and to a lesser extent the right lower lobe. Small right pleural effusion. Chronic mucous plugging in the right lower lobe posterior basal segment. Unchanged 2 mm nodule in the left upper lobe. Linear scarring in the left lower lobe. Upper Abdomen: No acute abnormality.  Small hiatal hernia. Musculoskeletal: No chest wall abnormality. No acute or significant osseous findings. Review of the MIP images confirms the above findings. IMPRESSION: 1. No evidence of pulmonary embolism. 2. Patchy consolidation in the right upper lobe and to a lesser extent the right lower lobe, consistent with multifocal pneumonia. 3. Small right pleural effusion. 4. Unchanged dilated main and right pulmonary arteries, consistent with pulmonary arterial hypertension. 5. Aortic Atherosclerosis (ICD10-I70.0) and Emphysema (ICD10-J43.9). Electronically Signed   By: Titus Dubin M.D.   On: 08/20/2020 15:24   DG CHEST PORT 1  VIEW  Result Date: 08/23/2020 CLINICAL DATA:  COPD.  Shortness of breath.  Cough.  Pneumonia. EXAM: PORTABLE CHEST 1 VIEW COMPARISON:  08/20/2020.  CT 08/20/2020. FINDINGS: Mediastinum hilar structures normal. Persistent prominent right mid lung and right lung base infiltrates. New infiltrate left lung base. Findings consistent multifocal pneumonia. Small bilateral pleural effusions. Cardiomegaly. No pulmonary venous congestion. IMPRESSION: Persistent prominent right mid lung and right lung base infiltrates. New infiltrate left lung base. Findings consistent with multifocal pneumonia. Small bilateral pleural effusions. Electronically Signed   By: Marcello Moores  Register   On: 08/23/2020 12:47  Labs: BNP (last 3 results) No results for input(s): BNP in the last 8760 hours. Basic Metabolic Panel: Recent Labs  Lab 08/20/20 1300 08/20/20 1843 08/21/20 0503  NA 139 139 141  K 4.2 3.8 3.6  CL 101 101 102  CO2 27 27 27   GLUCOSE 129* 109* 101*  BUN 18 17 13   CREATININE 0.69 0.73 0.72  CALCIUM 8.9 8.6* 8.6*   Liver Function Tests: Recent Labs  Lab 08/20/20 1300 08/20/20 1843 08/21/20 0503  AST 58* 45* 35  ALT 47* 39 33  ALKPHOS 85 82 72  BILITOT 0.8 0.7 0.9  PROT 7.6 7.0 6.0*  ALBUMIN 3.3* 3.0* 2.7*   No results for input(s): LIPASE, AMYLASE in the last 168 hours. No results for input(s): AMMONIA in the last 168 hours. CBC: Recent Labs  Lab 08/20/20 1300 08/20/20 1843 08/21/20 0503 08/22/20 0548 08/23/20 0500  WBC 19.8* 15.4* 13.4* 12.6* 8.1  NEUTROABS 16.5* 12.3*  --  10.1* 7.1  HGB 11.8* 11.3* 9.6* 9.3* 9.7*  HCT 37.1 35.6* 30.7* 30.6* 31.9*  MCV 91.2 92.5 92.5 94.4 93.0  PLT 316 320 278 253 299   Cardiac Enzymes: No results for input(s): CKTOTAL, CKMB, CKMBINDEX, TROPONINI in the last 168 hours. BNP: Invalid input(s): POCBNP CBG: No results for input(s): GLUCAP in the last 168 hours. D-Dimer No results for input(s): DDIMER in the last 72 hours. Hgb A1c No results  for input(s): HGBA1C in the last 72 hours. Lipid Profile No results for input(s): CHOL, HDL, LDLCALC, TRIG, CHOLHDL, LDLDIRECT in the last 72 hours. Thyroid function studies No results for input(s): TSH, T4TOTAL, T3FREE, THYROIDAB in the last 72 hours.  Invalid input(s): FREET3 Anemia work up No results for input(s): VITAMINB12, FOLATE, FERRITIN, TIBC, IRON, RETICCTPCT in the last 72 hours. Urinalysis    Component Value Date/Time   BILIRUBINUR neg 12/22/2012 1409   PROTEINUR small 12/22/2012 1409   UROBILINOGEN negative 12/22/2012 1409   NITRITE neg 12/22/2012 1409   LEUKOCYTESUR Negative 12/22/2012 1409   Sepsis Labs Invalid input(s): PROCALCITONIN,  WBC,  LACTICIDVEN Microbiology Recent Results (from the past 240 hour(s))  SARS Coronavirus 2 by RT PCR (hospital order, performed in [facility] hospital lab) Nasopharyngeal Nasopharyngeal Swab     Status: None   Collection Time: 08/20/20  1:00 PM   Specimen: Nasopharyngeal Swab  Result Value Ref Range Status   SARS Coronavirus 2 NEGATIVE NEGATIVE Final    Comment: (NOTE) SARS-CoV-2 target nucleic acids are NOT DETECTED.  The SARS-CoV-2 RNA is generally detectable in upper and lower respiratory specimens during the acute phase of infection. The lowest concentration of SARS-CoV-2 viral copies this assay can detect is 250 copies / mL. A negative result does not preclude SARS-CoV-2 infection and should not be used as the sole basis for treatment or other patient management decisions.  A negative result may occur with improper specimen collection / handling, submission of specimen other than nasopharyngeal swab, presence of viral mutation(s) within the areas targeted by this assay, and inadequate number of viral copies (<250 copies / mL). A negative result must be combined with clinical observations, patient history, and epidemiological information.  Fact Sheet for Patients:   StrictlyIdeas.no  Fact  Sheet for Healthcare Providers: BankingDealers.co.za  This test is not yet approved or  cleared by the [place] FDA and has been authorized for detection and/or diagnosis of SARS-CoV-2 by FDA under an Emergency Use Authorization (EUA).  This EUA will remain in effect (meaning this test can be used)  for the duration of the COVID-19 declaration under Section 564(b)(1) of the Act, 21 U.S.C. section 360bbb-3(b)(1), unless the authorization is terminated or revoked sooner.  Performed at [facility], [place] [address] [place], [place] [address]   Culture, blood (x 2)     Status: None   Collection Time: 08/20/20  6:40 PM   Specimen: BLOOD  Result Value Ref Range Status   Specimen Description   Final    BLOOD RIGHT ANTECUBITAL Performed at [place] [address], [place], [place] [address]    Special Requests   Final    BOTTLES DRAWN AEROBIC AND ANAEROBIC Blood Culture adequate volume Performed at [facility] [address] [place], [place] [address]    Culture   Final    NO GROWTH 5 DAYS Performed at [facility], [place] [address] [place], [place] [address]    Report Status 08/25/2020 FINAL  Final  Culture, blood (x 2)     Status: None   Collection Time: 08/20/20  7:18 PM   Specimen: BLOOD LEFT HAND  Result Value Ref Range Status   Specimen Description   Final    BLOOD LEFT HAND Performed at [facility] [address] [place], [place] [address]    Special Requests   Final    BOTTLES DRAWN AEROBIC ONLY Blood Culture adequate volume Performed at [facility] [address] [place], [place] [address]    Culture   Final    NO GROWTH 5 DAYS Performed at [facility], [place] [address], [place],  [address]    Report Status 08/25/2020 FINAL  Final     Time coordinating discharge: 25 minutes  SIGNED: Antonieta Pert, MD  [facility] 08/25/2020, 1:59 PM  If 7PM-7AM, please contact night-coverage [email or web address]

## 2020-08-25 NOTE — Progress Notes (Signed)
Night RN reports pt remain on O2 at 2.5 liters with O2 sats 94%. Pt resting and currently on O2 at 2.5 liters O2 sats 93-95% will cont to monitor. SRP,RN

## 2020-08-25 NOTE — Progress Notes (Signed)
Physical Therapy Treatment Patient Details Name: [name] MRN: [id number] DOB: 1932-03-04 Today's Date: 08/25/2020    History of Present Illness 86 y.o. female with medical history significant of COPD, prior tobacco abuse who presents to the hospital with worsening shortness of breath and cough and found to have acute on chronic hypoxemic respiratory failure and COPD exacerbation. Patient uses 2 liter o2 at home and currently on 4 liters.    PT Comments    Assisted OOB to amb to bathroom.  General transfer comment: good safety cognition and use of hands to support self.  25% VC's to avoid pulling up on walker to stand.  Also assisted with a toilet transfer.  Pt self able to perform peri care standing with no LOB but did require a seated rest break after due to 2/4 dyspnea. General Gait Details: pt required 3 lts during activity to achieve sats above 90%.  25% VC's to decrease gait speed and instructed on energy conservation.  Pt tolerated  distance but present with23/4 dyspnea.  Per daughter, improving not performing as well as she did at home.  Pt progressing slowly.   Follow Up Recommendations  Home health PT;Supervision - Intermittent     Equipment Recommendations   (oxygen)    Recommendations for Other Services       Precautions / Restrictions Precautions Precautions: Fall Precaution Comments: O2 dep 2 lts    Mobility  Bed Mobility Overal bed mobility: Modified Independent             General bed mobility comments: increased time and extended seated rest break due to dyspnea  Transfers Overall transfer level: Needs assistance Equipment used: 1 person hand held assist;4-wheeled walker Transfers: Sit to/from Stand;Stand Pivot Transfers Sit to Stand: Supervision Stand pivot transfers: Supervision;Min guard       General transfer comment: good safety cognition and use of hands to support self.  25% VC's to avoid pulling up on walker to stand.  Also assisted  with a toilet transfer.  Pt self able to perform peri care standing with no LOB but did require a seated rest break after due to 2/4 dyspnea.  Ambulation/Gait Ambulation/Gait assistance: Supervision Gait Distance (Feet): 150 Feet (75 feet x 2 one seated rest break on Rollator) Assistive device: 4-wheeled walker Gait Pattern/deviations: Step-through pattern;Decreased stride length Gait velocity: decreased   General Gait Details: pt required 3 lts during activity to achieve sats above 90%.  25% VC's to decrease gait speed and instructed on energy conservation.  Pt tolerated  distance but present with23/4 dyspnea.  Per daughter, improving not performing as well as she did at home   Stairs             Wheelchair Mobility    Modified Rankin (Stroke Patients Only)       Balance                                            Cognition Arousal/Alertness: Awake/alert Behavior During Therapy: WFL for tasks assessed/performed Overall Cognitive Status: Within Functional Limits for tasks assessed                                 General Comments: AxO x 3 pleasant and motivated      Exercises      General Comments  Pertinent Vitals/Pain Pain Assessment: No/denies pain    Home Living                      Prior Function            PT Goals (current goals can now be found in the care plan section) Progress towards PT goals: Progressing toward goals    Frequency           PT Plan Current plan remains appropriate    Co-evaluation              AM-PAC PT "6 Clicks" Mobility   Outcome Measure  Help needed turning from your back to your side while in a flat bed without using bedrails?: None Help needed moving from lying on your back to sitting on the side of a flat bed without using bedrails?: None Help needed moving to and from a bed to a chair (including a wheelchair)?: None Help needed standing up from a chair  using your arms (e.g., wheelchair or bedside chair)?: None Help needed to walk in hospital room?: A Little Help needed climbing 3-5 steps with a railing? : A Little 6 Click Score: 22    End of Session Equipment Utilized During Treatment: Gait belt;Oxygen Activity Tolerance: Patient tolerated treatment well Patient left: in chair;with call bell/phone within reach;with family/visitor present Nurse Communication: Mobility status PT Visit Diagnosis: Muscle weakness (generalized) (M62.81);Unsteadiness on feet (R26.81);Difficulty in walking, not elsewhere classified (R26.2)     Time: 1000-1026 PT Time Calculation (min) (ACUTE ONLY): 26 min  Charges:  $Gait Training: 8-22 mins $Therapeutic Activity: 8-22 mins                     Rica Koyanagi  PTA Acute  Rehabilitation Services Pager      [phone] Office      [phone]

## 2020-08-25 NOTE — Plan of Care (Signed)
  Problem: Education: Goal: Knowledge of General Education information will improve Description: Including pain rating scale, medication(s)/side effects and non-pharmacologic comfort measures 08/25/2020 1415 by Zadie Rhine, RN Outcome: Adequate for Discharge 08/25/2020 1145 by Zadie Rhine, RN Outcome: Progressing   Problem: Health Behavior/Discharge Planning: Goal: Ability to manage health-related needs will improve 08/25/2020 1415 by Zadie Rhine, RN Outcome: Adequate for Discharge 08/25/2020 1145 by Zadie Rhine, RN Outcome: Progressing   Problem: Clinical Measurements: Goal: Ability to maintain clinical measurements within normal limits will improve Outcome: Adequate for Discharge Goal: Will remain free from infection Outcome: Adequate for Discharge Goal: Diagnostic test results will improve Outcome: Adequate for Discharge Goal: Respiratory complications will improve Outcome: Adequate for Discharge Goal: Cardiovascular complication will be avoided Outcome: Adequate for Discharge

## 2020-08-25 NOTE — Plan of Care (Signed)
  Problem: Education: Goal: Knowledge of General Education information will improve Description Including pain rating scale, medication(s)/side effects and non-pharmacologic comfort measures Outcome: Progressing   Problem: Health Behavior/Discharge Planning: Goal: Ability to manage health-related needs will improve Outcome: Progressing   

## 2020-08-25 NOTE — Progress Notes (Signed)
Pt discharged to home with daughter, home health arranged, O2 at beside, pt already set up with home O2. Instructions reviewed with pt and daughter acknowledged understanding. SRP, RN

## 2020-08-25 NOTE — Progress Notes (Addendum)
Pt has low endurance when completing ADLs. Pt request if d/c RN or Nurse aide are considered for home assistance. Pt also lives at home alone.  Will update MD. SRP, RN

## 2020-08-26 ENCOUNTER — Telehealth: Payer: Self-pay

## 2020-08-26 DIAGNOSIS — Z7952 Long term (current) use of systemic steroids: Secondary | ICD-10-CM | POA: Diagnosis not present

## 2020-08-26 DIAGNOSIS — M810 Age-related osteoporosis without current pathological fracture: Secondary | ICD-10-CM | POA: Diagnosis not present

## 2020-08-26 DIAGNOSIS — Z9981 Dependence on supplemental oxygen: Secondary | ICD-10-CM | POA: Diagnosis not present

## 2020-08-26 DIAGNOSIS — Z87891 Personal history of nicotine dependence: Secondary | ICD-10-CM | POA: Diagnosis not present

## 2020-08-26 DIAGNOSIS — Z9049 Acquired absence of other specified parts of digestive tract: Secondary | ICD-10-CM | POA: Diagnosis not present

## 2020-08-26 DIAGNOSIS — I2781 Cor pulmonale (chronic): Secondary | ICD-10-CM | POA: Diagnosis not present

## 2020-08-26 DIAGNOSIS — J441 Chronic obstructive pulmonary disease with (acute) exacerbation: Secondary | ICD-10-CM | POA: Diagnosis not present

## 2020-08-26 DIAGNOSIS — K5790 Diverticulosis of intestine, part unspecified, without perforation or abscess without bleeding: Secondary | ICD-10-CM | POA: Diagnosis not present

## 2020-08-26 DIAGNOSIS — F419 Anxiety disorder, unspecified: Secondary | ICD-10-CM | POA: Diagnosis not present

## 2020-08-26 DIAGNOSIS — K644 Residual hemorrhoidal skin tags: Secondary | ICD-10-CM | POA: Diagnosis not present

## 2020-08-26 DIAGNOSIS — J189 Pneumonia, unspecified organism: Secondary | ICD-10-CM | POA: Diagnosis not present

## 2020-08-26 DIAGNOSIS — A419 Sepsis, unspecified organism: Secondary | ICD-10-CM | POA: Diagnosis not present

## 2020-08-26 DIAGNOSIS — J9621 Acute and chronic respiratory failure with hypoxia: Secondary | ICD-10-CM | POA: Diagnosis not present

## 2020-08-26 DIAGNOSIS — J440 Chronic obstructive pulmonary disease with acute lower respiratory infection: Secondary | ICD-10-CM | POA: Diagnosis not present

## 2020-08-26 DIAGNOSIS — D649 Anemia, unspecified: Secondary | ICD-10-CM | POA: Diagnosis not present

## 2020-08-26 NOTE — Telephone Encounter (Signed)
Called [name] back and go the ok for PT.

## 2020-08-26 NOTE — Telephone Encounter (Signed)
Jerilyn from [facility] called for a verbal order for PT once weekly for 1 week, 2 times weekly for 3 weeks, and then once weekly for 4 weeks. She can be reached at [phone].

## 2020-08-26 NOTE — Telephone Encounter (Signed)
ok 

## 2020-08-31 ENCOUNTER — Telehealth: Payer: Self-pay

## 2020-08-31 DIAGNOSIS — J9621 Acute and chronic respiratory failure with hypoxia: Secondary | ICD-10-CM | POA: Diagnosis not present

## 2020-08-31 DIAGNOSIS — D649 Anemia, unspecified: Secondary | ICD-10-CM | POA: Diagnosis not present

## 2020-08-31 DIAGNOSIS — J189 Pneumonia, unspecified organism: Secondary | ICD-10-CM | POA: Diagnosis not present

## 2020-08-31 DIAGNOSIS — J440 Chronic obstructive pulmonary disease with acute lower respiratory infection: Secondary | ICD-10-CM | POA: Diagnosis not present

## 2020-08-31 DIAGNOSIS — J441 Chronic obstructive pulmonary disease with (acute) exacerbation: Secondary | ICD-10-CM | POA: Diagnosis not present

## 2020-08-31 DIAGNOSIS — A419 Sepsis, unspecified organism: Secondary | ICD-10-CM | POA: Diagnosis not present

## 2020-08-31 NOTE — Telephone Encounter (Signed)
[facility] was advised . [facility]

## 2020-08-31 NOTE — Telephone Encounter (Signed)
[facility] is requesting OT  2 times a week for 4 weeks. Please advise [employer]

## 2020-08-31 NOTE — Telephone Encounter (Signed)
ok 

## 2020-09-01 ENCOUNTER — Other Ambulatory Visit: Payer: Self-pay

## 2020-09-01 ENCOUNTER — Ambulatory Visit: Payer: Medicare Other | Admitting: Family Medicine

## 2020-09-01 VITALS — BP 110/62 | HR 50 | Temp 97.1°F | Wt 133.6 lb

## 2020-09-01 DIAGNOSIS — J189 Pneumonia, unspecified organism: Secondary | ICD-10-CM

## 2020-09-01 DIAGNOSIS — J9612 Chronic respiratory failure with hypercapnia: Secondary | ICD-10-CM | POA: Diagnosis not present

## 2020-09-01 DIAGNOSIS — Z23 Encounter for immunization: Secondary | ICD-10-CM

## 2020-09-01 MED ORDER — COVID-19 MRNA VACCINE (PFIZER) 30 MCG/0.3ML IM SUSP: 0.3000 mL | Freq: Once | INTRAMUSCULAR | 0 refills | Status: DC

## 2020-09-01 NOTE — Progress Notes (Signed)
   Subjective:    Patient ID: [name], female    DOB: September 13, 1932, 86 y.o.   MRN: [id number]  HPI She is here for posthospitalization follow-up.  She does have underlying COPD and is oxygen dependent usually at 2 L.  She was admitted and found to have pneumonia as well as sepsis.  She responded well to azithromycin oxygen therapy etc.  Presently she is having no chest pain or shortness of breath.  She is on 2 L of oxygen which is her normal.  Her medications were reviewed.   Review of Systems     Objective:   Physical Exam  Alert and in no distress.  Oxygen cannula noted.  Cardiac exam shows regular rhythm without murmurs or gallops.  Lungs are clear to auscultation. The hospital discharge summary including blood work and x-rays was reviewed     Assessment & Plan:  Chronic respiratory failure with hypercapnia ([place]) - Plan: CBC with Differential/Platelet, Comprehensive metabolic panel  Need for vaccination - Plan: DISCONTINUED: COVID-19 mRNA vaccine, Pfizer, 30 MCG/0.3ML injection  Need for influenza vaccination - Plan: Flu Vaccine QUAD High Dose(Fluad)  Pneumonia due to infectious organism, unspecified laterality, unspecified part of lung - Plan: CBC with Differential/Platelet, Comprehensive metabolic panel  Need for viral immunization - Plan: Pfizer SARS-COV-2 Vaccine  She will continue on her present medication regimen.  I decided to go ahead and give her a booster as she is roughly 7 months down the road.  Every precaution we can give her should be taken.  She will continue on her present oxygen therapy.  Her daughter who is here with her and will be helping to care for her.

## 2020-09-02 DIAGNOSIS — A419 Sepsis, unspecified organism: Secondary | ICD-10-CM | POA: Diagnosis not present

## 2020-09-02 DIAGNOSIS — J189 Pneumonia, unspecified organism: Secondary | ICD-10-CM | POA: Diagnosis not present

## 2020-09-02 DIAGNOSIS — D649 Anemia, unspecified: Secondary | ICD-10-CM | POA: Diagnosis not present

## 2020-09-02 DIAGNOSIS — J441 Chronic obstructive pulmonary disease with (acute) exacerbation: Secondary | ICD-10-CM | POA: Diagnosis not present

## 2020-09-02 DIAGNOSIS — J440 Chronic obstructive pulmonary disease with acute lower respiratory infection: Secondary | ICD-10-CM | POA: Diagnosis not present

## 2020-09-02 DIAGNOSIS — J9621 Acute and chronic respiratory failure with hypoxia: Secondary | ICD-10-CM | POA: Diagnosis not present

## 2020-09-02 LAB — COMPREHENSIVE METABOLIC PANEL
ALT: 16 IU/L (ref 0–32)
AST: 21 IU/L (ref 0–40)
Albumin/Globulin Ratio: 1.3 (ref 1.2–2.2)
Albumin: 3.7 g/dL (ref 3.6–4.6)
Alkaline Phosphatase: 79 IU/L (ref 48–121)
BUN/Creatinine Ratio: 26 (ref 12–28)
BUN: 20 mg/dL (ref 8–27)
Bilirubin Total: 0.3 mg/dL (ref 0.0–1.2)
CO2: 32 mmol/L — ABNORMAL HIGH (ref 20–29)
Calcium: 9.4 mg/dL (ref 8.7–10.3)
Chloride: 99 mmol/L (ref 96–106)
Creatinine, Ser: 0.77 mg/dL (ref 0.57–1.00)
GFR calc Af Amer: 81 mL/min/{1.73_m2} (ref >59)
GFR calc non Af Amer: 71 mL/min/{1.73_m2} (ref >59)
Globulin, Total: 2.9 g/dL (ref 1.5–4.5)
Glucose: 110 mg/dL — ABNORMAL HIGH (ref 65–99)
Potassium: 5.9 mmol/L — ABNORMAL HIGH (ref 3.5–5.2)
Sodium: 140 mmol/L (ref 134–144)
Total Protein: 6.6 g/dL (ref 6.0–8.5)

## 2020-09-02 LAB — CBC WITH DIFFERENTIAL/PLATELET
Basophils Absolute: 0.1 10*3/uL (ref 0.0–0.2)
Basos: 1 %
EOS (ABSOLUTE): 0 10*3/uL (ref 0.0–0.4)
Eos: 0 %
Hematocrit: 35.8 % (ref 34.0–46.6)
Hemoglobin: 11.4 g/dL (ref 11.1–15.9)
Immature Grans (Abs): 0.4 10*3/uL — ABNORMAL HIGH (ref 0.0–0.1)
Immature Granulocytes: 5 %
Lymphocytes Absolute: 1 10*3/uL (ref 0.7–3.1)
Lymphs: 10 %
MCH: 27.9 pg (ref 26.6–33.0)
MCHC: 31.8 g/dL (ref 31.5–35.7)
MCV: 88 fL (ref 79–97)
Monocytes Absolute: 0.3 10*3/uL (ref 0.1–0.9)
Monocytes: 4 %
Neutrophils Absolute: 7.9 10*3/uL — ABNORMAL HIGH (ref 1.4–7.0)
Neutrophils: 80 %
Platelets: 314 10*3/uL (ref 150–450)
RBC: 4.08 x10E6/uL (ref 3.77–5.28)
RDW: 14.9 % (ref 11.7–15.4)
WBC: 9.7 10*3/uL (ref 3.4–10.8)

## 2020-09-05 ENCOUNTER — Other Ambulatory Visit: Payer: Self-pay

## 2020-09-05 ENCOUNTER — Telehealth: Payer: Self-pay

## 2020-09-05 ENCOUNTER — Other Ambulatory Visit: Payer: Medicare Other

## 2020-09-05 DIAGNOSIS — J9621 Acute and chronic respiratory failure with hypoxia: Secondary | ICD-10-CM | POA: Diagnosis not present

## 2020-09-05 DIAGNOSIS — D649 Anemia, unspecified: Secondary | ICD-10-CM | POA: Diagnosis not present

## 2020-09-05 DIAGNOSIS — E875 Hyperkalemia: Secondary | ICD-10-CM | POA: Diagnosis not present

## 2020-09-05 DIAGNOSIS — J441 Chronic obstructive pulmonary disease with (acute) exacerbation: Secondary | ICD-10-CM | POA: Diagnosis not present

## 2020-09-05 DIAGNOSIS — J440 Chronic obstructive pulmonary disease with acute lower respiratory infection: Secondary | ICD-10-CM | POA: Diagnosis not present

## 2020-09-05 DIAGNOSIS — A419 Sepsis, unspecified organism: Secondary | ICD-10-CM | POA: Diagnosis not present

## 2020-09-05 DIAGNOSIS — J189 Pneumonia, unspecified organism: Secondary | ICD-10-CM | POA: Diagnosis not present

## 2020-09-05 DIAGNOSIS — J9612 Chronic respiratory failure with hypercapnia: Secondary | ICD-10-CM

## 2020-09-05 NOTE — Telephone Encounter (Signed)
Pt daugther is requesting a order for palliative care. Please advise [facility]

## 2020-09-05 NOTE — Telephone Encounter (Signed)
I am pretty sure this is through Hospice so make sure she is aware of that

## 2020-09-05 NOTE — Telephone Encounter (Signed)
Referral was put in. [facility] 

## 2020-09-06 ENCOUNTER — Telehealth: Payer: Self-pay | Admitting: Family Medicine

## 2020-09-06 LAB — BASIC METABOLIC PANEL
BUN/Creatinine Ratio: 23 (ref 12–28)
BUN: 16 mg/dL (ref 8–27)
CO2: 29 mmol/L (ref 20–29)
Calcium: 9.8 mg/dL (ref 8.7–10.3)
Chloride: 98 mmol/L (ref 96–106)
Creatinine, Ser: 0.7 mg/dL (ref 0.57–1.00)
GFR calc Af Amer: 91 mL/min/{1.73_m2} (ref >59)
GFR calc non Af Amer: 79 mL/min/{1.73_m2} (ref >59)
Glucose: 103 mg/dL — ABNORMAL HIGH (ref 65–99)
Potassium: 4.5 mmol/L (ref 3.5–5.2)
Sodium: 139 mmol/L (ref 134–144)

## 2020-09-06 NOTE — Telephone Encounter (Signed)
[name] with [facility] [phone] she needs verbal for nursing 1 x week for 9 weeks for COPD & Pneumonia management.  Daughter having to go back to her home state.

## 2020-09-06 NOTE — Telephone Encounter (Signed)
ok 

## 2020-09-07 ENCOUNTER — Other Ambulatory Visit: Payer: Self-pay

## 2020-09-07 DIAGNOSIS — J449 Chronic obstructive pulmonary disease, unspecified: Secondary | ICD-10-CM

## 2020-09-07 DIAGNOSIS — J440 Chronic obstructive pulmonary disease with acute lower respiratory infection: Secondary | ICD-10-CM | POA: Diagnosis not present

## 2020-09-07 DIAGNOSIS — J9621 Acute and chronic respiratory failure with hypoxia: Secondary | ICD-10-CM | POA: Diagnosis not present

## 2020-09-07 DIAGNOSIS — J189 Pneumonia, unspecified organism: Secondary | ICD-10-CM | POA: Diagnosis not present

## 2020-09-07 DIAGNOSIS — J441 Chronic obstructive pulmonary disease with (acute) exacerbation: Secondary | ICD-10-CM | POA: Diagnosis not present

## 2020-09-07 DIAGNOSIS — A419 Sepsis, unspecified organism: Secondary | ICD-10-CM | POA: Diagnosis not present

## 2020-09-07 DIAGNOSIS — J9612 Chronic respiratory failure with hypercapnia: Secondary | ICD-10-CM

## 2020-09-07 DIAGNOSIS — D649 Anemia, unspecified: Secondary | ICD-10-CM | POA: Diagnosis not present

## 2020-09-08 ENCOUNTER — Telehealth: Payer: Self-pay

## 2020-09-08 NOTE — Telephone Encounter (Signed)
Palliative care visit scheduled for Fri 09-16-2020 @1pm  with patients daughter.

## 2020-09-09 DIAGNOSIS — J189 Pneumonia, unspecified organism: Secondary | ICD-10-CM | POA: Diagnosis not present

## 2020-09-09 DIAGNOSIS — A419 Sepsis, unspecified organism: Secondary | ICD-10-CM | POA: Diagnosis not present

## 2020-09-09 DIAGNOSIS — J441 Chronic obstructive pulmonary disease with (acute) exacerbation: Secondary | ICD-10-CM | POA: Diagnosis not present

## 2020-09-09 DIAGNOSIS — J9621 Acute and chronic respiratory failure with hypoxia: Secondary | ICD-10-CM | POA: Diagnosis not present

## 2020-09-09 DIAGNOSIS — D649 Anemia, unspecified: Secondary | ICD-10-CM | POA: Diagnosis not present

## 2020-09-09 DIAGNOSIS — J440 Chronic obstructive pulmonary disease with acute lower respiratory infection: Secondary | ICD-10-CM | POA: Diagnosis not present

## 2020-09-09 NOTE — Telephone Encounter (Signed)
Called and gave verbal order.

## 2020-09-12 DIAGNOSIS — J441 Chronic obstructive pulmonary disease with (acute) exacerbation: Secondary | ICD-10-CM | POA: Diagnosis not present

## 2020-09-12 DIAGNOSIS — J440 Chronic obstructive pulmonary disease with acute lower respiratory infection: Secondary | ICD-10-CM | POA: Diagnosis not present

## 2020-09-12 DIAGNOSIS — A419 Sepsis, unspecified organism: Secondary | ICD-10-CM | POA: Diagnosis not present

## 2020-09-12 DIAGNOSIS — J9621 Acute and chronic respiratory failure with hypoxia: Secondary | ICD-10-CM | POA: Diagnosis not present

## 2020-09-12 DIAGNOSIS — D649 Anemia, unspecified: Secondary | ICD-10-CM | POA: Diagnosis not present

## 2020-09-12 DIAGNOSIS — J189 Pneumonia, unspecified organism: Secondary | ICD-10-CM | POA: Diagnosis not present

## 2020-09-13 DIAGNOSIS — J441 Chronic obstructive pulmonary disease with (acute) exacerbation: Secondary | ICD-10-CM | POA: Diagnosis not present

## 2020-09-13 DIAGNOSIS — D649 Anemia, unspecified: Secondary | ICD-10-CM | POA: Diagnosis not present

## 2020-09-13 DIAGNOSIS — J440 Chronic obstructive pulmonary disease with acute lower respiratory infection: Secondary | ICD-10-CM | POA: Diagnosis not present

## 2020-09-13 DIAGNOSIS — J9621 Acute and chronic respiratory failure with hypoxia: Secondary | ICD-10-CM | POA: Diagnosis not present

## 2020-09-13 DIAGNOSIS — A419 Sepsis, unspecified organism: Secondary | ICD-10-CM | POA: Diagnosis not present

## 2020-09-13 DIAGNOSIS — J189 Pneumonia, unspecified organism: Secondary | ICD-10-CM | POA: Diagnosis not present

## 2020-09-14 DIAGNOSIS — J441 Chronic obstructive pulmonary disease with (acute) exacerbation: Secondary | ICD-10-CM | POA: Diagnosis not present

## 2020-09-14 DIAGNOSIS — J189 Pneumonia, unspecified organism: Secondary | ICD-10-CM | POA: Diagnosis not present

## 2020-09-14 DIAGNOSIS — J440 Chronic obstructive pulmonary disease with acute lower respiratory infection: Secondary | ICD-10-CM | POA: Diagnosis not present

## 2020-09-14 DIAGNOSIS — J9621 Acute and chronic respiratory failure with hypoxia: Secondary | ICD-10-CM | POA: Diagnosis not present

## 2020-09-14 DIAGNOSIS — A419 Sepsis, unspecified organism: Secondary | ICD-10-CM | POA: Diagnosis not present

## 2020-09-14 DIAGNOSIS — D649 Anemia, unspecified: Secondary | ICD-10-CM | POA: Diagnosis not present

## 2020-09-15 DIAGNOSIS — D649 Anemia, unspecified: Secondary | ICD-10-CM | POA: Diagnosis not present

## 2020-09-15 DIAGNOSIS — J189 Pneumonia, unspecified organism: Secondary | ICD-10-CM | POA: Diagnosis not present

## 2020-09-15 DIAGNOSIS — J9621 Acute and chronic respiratory failure with hypoxia: Secondary | ICD-10-CM | POA: Diagnosis not present

## 2020-09-15 DIAGNOSIS — J441 Chronic obstructive pulmonary disease with (acute) exacerbation: Secondary | ICD-10-CM | POA: Diagnosis not present

## 2020-09-15 DIAGNOSIS — J440 Chronic obstructive pulmonary disease with acute lower respiratory infection: Secondary | ICD-10-CM | POA: Diagnosis not present

## 2020-09-15 DIAGNOSIS — A419 Sepsis, unspecified organism: Secondary | ICD-10-CM | POA: Diagnosis not present

## 2020-09-16 ENCOUNTER — Other Ambulatory Visit: Payer: Medicare Other

## 2020-09-16 ENCOUNTER — Other Ambulatory Visit: Payer: Self-pay

## 2020-09-16 DIAGNOSIS — A419 Sepsis, unspecified organism: Secondary | ICD-10-CM | POA: Diagnosis not present

## 2020-09-16 DIAGNOSIS — J189 Pneumonia, unspecified organism: Secondary | ICD-10-CM | POA: Diagnosis not present

## 2020-09-16 DIAGNOSIS — D649 Anemia, unspecified: Secondary | ICD-10-CM | POA: Diagnosis not present

## 2020-09-16 DIAGNOSIS — Z515 Encounter for palliative care: Secondary | ICD-10-CM

## 2020-09-16 DIAGNOSIS — J441 Chronic obstructive pulmonary disease with (acute) exacerbation: Secondary | ICD-10-CM | POA: Diagnosis not present

## 2020-09-16 DIAGNOSIS — J440 Chronic obstructive pulmonary disease with acute lower respiratory infection: Secondary | ICD-10-CM | POA: Diagnosis not present

## 2020-09-16 DIAGNOSIS — J9621 Acute and chronic respiratory failure with hypoxia: Secondary | ICD-10-CM | POA: Diagnosis not present

## 2020-09-16 NOTE — Progress Notes (Signed)
COMMUNITY PALLIATIVE CARE SW NOTE  PATIENT NAME: [name] DOB: 07-Mar-1932 MRN: [id number]  PRIMARY CARE PROVIDER: Denita Lung, MD  RESPONSIBLE PARTY:  Acct ID - Guarantor Home Phone Work Phone Relationship Acct Type  [id number] - [id number]  Self P/F     [place], [name], [place] [address]     PLAN OF CARE and INTERVENTIONS:             Patient is currently a full code. Code status discussed. Daughter, [name], is POA. Patients goal is to remain her home as independent as possible and avoid hospitalizations and falls. 2. SOCIAL/EMOTIONAL/SPIRITUAL ASSESSMENT/ INTERVENTIONS:  SW and RN Almyra Free visited with patient in her home, daughter involved via conference call. Patients resides in a one story home independently. Patient and daughter updated team on medical condition and changes. Patient recently hospitalized for PNA, scheduled for chest X-ray f/u next week on PNA. Patients appetite is increasing and drinks ensure. No falls. No pain. Patient is on 2LPM continuously. Patient has regular bowel movements. RN reviewed medications and checked vitals - 140/64 99%. Patient has private caregiver M & Th 2hrs to assist with MD appts, grocery shopping, etc. patient also has housekeeper Q fri. SW explained palliative care services. SW discussed goals, reviewed care plan, provided emotional support, used active and reflective listening.  3. PATIENT/CAREGIVER EDUCATION/ COPING: Patient was alert, engaged with SW. Patient was pleasant. Patient able to answer all questions appropriately. Patient does not have any anxiety in relation to O2. Patient enjoys cross word puzzles every now and then. Daughter lives out of state but is very supportive.  4. PERSONAL EMERGENCY PLAN:  Patient will call 9-1-1 for emergencies. Patient has Life alert system  5. COMMUNITY RESOURCES COORDINATION/ HEALTH CARE NAVIGATION:  Patient coordinates her care. Pt receiving PT/OT/RN through [facility].   6. FINANCIAL/LEGAL CONCERNS/INTERVENTIONS:  None     SOCIAL HX:  Social History   Tobacco Use   Smoking status: Former Smoker    Packs/day: 0.30    Years: 50.00    Pack years: 15.00    Types: Cigarettes    Quit date: 03/05/2013    Years since quitting: 7.5   Smokeless tobacco: Never Used  Substance Use Topics   Alcohol use: No    CODE STATUS: Full code ADVANCED DIRECTIVES: Y MOST FORM COMPLETE:  N HOSPICE EDUCATION PROVIDED: N  PPS: Patient is independent with all ADL's.       [name], [place]

## 2020-09-19 DIAGNOSIS — J441 Chronic obstructive pulmonary disease with (acute) exacerbation: Secondary | ICD-10-CM | POA: Diagnosis not present

## 2020-09-19 DIAGNOSIS — J189 Pneumonia, unspecified organism: Secondary | ICD-10-CM | POA: Diagnosis not present

## 2020-09-19 DIAGNOSIS — J9621 Acute and chronic respiratory failure with hypoxia: Secondary | ICD-10-CM | POA: Diagnosis not present

## 2020-09-19 DIAGNOSIS — J440 Chronic obstructive pulmonary disease with acute lower respiratory infection: Secondary | ICD-10-CM | POA: Diagnosis not present

## 2020-09-19 DIAGNOSIS — A419 Sepsis, unspecified organism: Secondary | ICD-10-CM | POA: Diagnosis not present

## 2020-09-19 DIAGNOSIS — D649 Anemia, unspecified: Secondary | ICD-10-CM | POA: Diagnosis not present

## 2020-09-20 DIAGNOSIS — J440 Chronic obstructive pulmonary disease with acute lower respiratory infection: Secondary | ICD-10-CM | POA: Diagnosis not present

## 2020-09-20 DIAGNOSIS — D649 Anemia, unspecified: Secondary | ICD-10-CM | POA: Diagnosis not present

## 2020-09-20 DIAGNOSIS — J189 Pneumonia, unspecified organism: Secondary | ICD-10-CM | POA: Diagnosis not present

## 2020-09-20 DIAGNOSIS — A419 Sepsis, unspecified organism: Secondary | ICD-10-CM | POA: Diagnosis not present

## 2020-09-20 DIAGNOSIS — J9621 Acute and chronic respiratory failure with hypoxia: Secondary | ICD-10-CM | POA: Diagnosis not present

## 2020-09-20 DIAGNOSIS — J441 Chronic obstructive pulmonary disease with (acute) exacerbation: Secondary | ICD-10-CM | POA: Diagnosis not present

## 2020-09-21 DIAGNOSIS — J440 Chronic obstructive pulmonary disease with acute lower respiratory infection: Secondary | ICD-10-CM | POA: Diagnosis not present

## 2020-09-21 DIAGNOSIS — A419 Sepsis, unspecified organism: Secondary | ICD-10-CM | POA: Diagnosis not present

## 2020-09-21 DIAGNOSIS — J9621 Acute and chronic respiratory failure with hypoxia: Secondary | ICD-10-CM | POA: Diagnosis not present

## 2020-09-21 DIAGNOSIS — J441 Chronic obstructive pulmonary disease with (acute) exacerbation: Secondary | ICD-10-CM | POA: Diagnosis not present

## 2020-09-21 DIAGNOSIS — J189 Pneumonia, unspecified organism: Secondary | ICD-10-CM | POA: Diagnosis not present

## 2020-09-21 DIAGNOSIS — D649 Anemia, unspecified: Secondary | ICD-10-CM | POA: Diagnosis not present

## 2020-09-22 ENCOUNTER — Telehealth: Payer: Self-pay | Admitting: Family Medicine

## 2020-09-22 DIAGNOSIS — J189 Pneumonia, unspecified organism: Secondary | ICD-10-CM

## 2020-09-22 DIAGNOSIS — J449 Chronic obstructive pulmonary disease, unspecified: Secondary | ICD-10-CM

## 2020-09-22 NOTE — Telephone Encounter (Signed)
Pt called and stated that when she was in the hospital she was told to go get a chest X-ray in four weeks. She went today to [facility] and they has no orders. Please advise pt at [phone].

## 2020-09-22 NOTE — Telephone Encounter (Signed)
Let her know that I just ordered it

## 2020-09-22 NOTE — Telephone Encounter (Signed)
Pt was called and advised. She states she will not be able to go to get xray until Monday due to transportation

## 2020-09-23 DIAGNOSIS — J440 Chronic obstructive pulmonary disease with acute lower respiratory infection: Secondary | ICD-10-CM | POA: Diagnosis not present

## 2020-09-23 DIAGNOSIS — J9621 Acute and chronic respiratory failure with hypoxia: Secondary | ICD-10-CM | POA: Diagnosis not present

## 2020-09-23 DIAGNOSIS — J189 Pneumonia, unspecified organism: Secondary | ICD-10-CM | POA: Diagnosis not present

## 2020-09-23 DIAGNOSIS — D649 Anemia, unspecified: Secondary | ICD-10-CM | POA: Diagnosis not present

## 2020-09-23 DIAGNOSIS — J441 Chronic obstructive pulmonary disease with (acute) exacerbation: Secondary | ICD-10-CM | POA: Diagnosis not present

## 2020-09-23 DIAGNOSIS — A419 Sepsis, unspecified organism: Secondary | ICD-10-CM | POA: Diagnosis not present

## 2020-09-25 DIAGNOSIS — J189 Pneumonia, unspecified organism: Secondary | ICD-10-CM | POA: Diagnosis not present

## 2020-09-25 DIAGNOSIS — J441 Chronic obstructive pulmonary disease with (acute) exacerbation: Secondary | ICD-10-CM | POA: Diagnosis not present

## 2020-09-25 DIAGNOSIS — A419 Sepsis, unspecified organism: Secondary | ICD-10-CM | POA: Diagnosis not present

## 2020-09-25 DIAGNOSIS — M810 Age-related osteoporosis without current pathological fracture: Secondary | ICD-10-CM | POA: Diagnosis not present

## 2020-09-25 DIAGNOSIS — I2781 Cor pulmonale (chronic): Secondary | ICD-10-CM | POA: Diagnosis not present

## 2020-09-25 DIAGNOSIS — J440 Chronic obstructive pulmonary disease with acute lower respiratory infection: Secondary | ICD-10-CM | POA: Diagnosis not present

## 2020-09-25 DIAGNOSIS — Z9981 Dependence on supplemental oxygen: Secondary | ICD-10-CM | POA: Diagnosis not present

## 2020-09-25 DIAGNOSIS — Z7952 Long term (current) use of systemic steroids: Secondary | ICD-10-CM | POA: Diagnosis not present

## 2020-09-25 DIAGNOSIS — D649 Anemia, unspecified: Secondary | ICD-10-CM | POA: Diagnosis not present

## 2020-09-25 DIAGNOSIS — K5790 Diverticulosis of intestine, part unspecified, without perforation or abscess without bleeding: Secondary | ICD-10-CM | POA: Diagnosis not present

## 2020-09-25 DIAGNOSIS — Z87891 Personal history of nicotine dependence: Secondary | ICD-10-CM | POA: Diagnosis not present

## 2020-09-25 DIAGNOSIS — F419 Anxiety disorder, unspecified: Secondary | ICD-10-CM | POA: Diagnosis not present

## 2020-09-25 DIAGNOSIS — J9621 Acute and chronic respiratory failure with hypoxia: Secondary | ICD-10-CM | POA: Diagnosis not present

## 2020-09-25 DIAGNOSIS — K644 Residual hemorrhoidal skin tags: Secondary | ICD-10-CM | POA: Diagnosis not present

## 2020-09-25 DIAGNOSIS — Z9049 Acquired absence of other specified parts of digestive tract: Secondary | ICD-10-CM | POA: Diagnosis not present

## 2020-09-26 ENCOUNTER — Ambulatory Visit
Admission: RE | Admit: 2020-09-26 | Discharge: 2020-09-26 | Disposition: A | Discharge status: Home / Self Care | Payer: Medicare Other | Source: Ambulatory Visit | Attending: Family Medicine | Admitting: Family Medicine

## 2020-09-26 DIAGNOSIS — R0602 Shortness of breath: Secondary | ICD-10-CM | POA: Diagnosis not present

## 2020-09-27 ENCOUNTER — Telehealth: Payer: Self-pay | Admitting: Family Medicine

## 2020-09-27 DIAGNOSIS — J440 Chronic obstructive pulmonary disease with acute lower respiratory infection: Secondary | ICD-10-CM | POA: Diagnosis not present

## 2020-09-27 DIAGNOSIS — J441 Chronic obstructive pulmonary disease with (acute) exacerbation: Secondary | ICD-10-CM | POA: Diagnosis not present

## 2020-09-27 DIAGNOSIS — J189 Pneumonia, unspecified organism: Secondary | ICD-10-CM | POA: Diagnosis not present

## 2020-09-27 DIAGNOSIS — J9621 Acute and chronic respiratory failure with hypoxia: Secondary | ICD-10-CM | POA: Diagnosis not present

## 2020-09-27 DIAGNOSIS — D649 Anemia, unspecified: Secondary | ICD-10-CM | POA: Diagnosis not present

## 2020-09-27 DIAGNOSIS — A419 Sepsis, unspecified organism: Secondary | ICD-10-CM | POA: Diagnosis not present

## 2020-09-27 NOTE — Telephone Encounter (Signed)
Received a call from [facility] with [facility]. He is requesting additional Occupational Therapy for pt,. He is requesting 2 times a week for three weeks. Please advise pt at [phone].

## 2020-09-27 NOTE — Telephone Encounter (Signed)
ok 

## 2020-09-27 NOTE — Telephone Encounter (Signed)
Vrbal given Kh

## 2020-09-28 DIAGNOSIS — D649 Anemia, unspecified: Secondary | ICD-10-CM | POA: Diagnosis not present

## 2020-09-28 DIAGNOSIS — J9621 Acute and chronic respiratory failure with hypoxia: Secondary | ICD-10-CM | POA: Diagnosis not present

## 2020-09-28 DIAGNOSIS — J440 Chronic obstructive pulmonary disease with acute lower respiratory infection: Secondary | ICD-10-CM | POA: Diagnosis not present

## 2020-09-28 DIAGNOSIS — J441 Chronic obstructive pulmonary disease with (acute) exacerbation: Secondary | ICD-10-CM | POA: Diagnosis not present

## 2020-09-28 DIAGNOSIS — A419 Sepsis, unspecified organism: Secondary | ICD-10-CM | POA: Diagnosis not present

## 2020-09-28 DIAGNOSIS — J189 Pneumonia, unspecified organism: Secondary | ICD-10-CM | POA: Diagnosis not present

## 2020-09-29 ENCOUNTER — Telehealth: Payer: Self-pay | Admitting: Family Medicine

## 2020-09-29 DIAGNOSIS — J189 Pneumonia, unspecified organism: Secondary | ICD-10-CM

## 2020-09-29 DIAGNOSIS — J449 Chronic obstructive pulmonary disease, unspecified: Secondary | ICD-10-CM

## 2020-09-29 MED ORDER — DOXYCYCLINE HYCLATE 100 MG PO TABS: 100.0000 mg | ORAL_TABLET | Freq: Two times a day (BID) | ORAL | 0 refills | Status: DC

## 2020-09-29 NOTE — Telephone Encounter (Signed)
I was under the impression that she was getting better but since there is a question of, I will go with an antibiotic and set her up for chest x-ray in a month.

## 2020-09-29 NOTE — Telephone Encounter (Signed)
Amy home health nurse with [facility] called t# [phone] and states she sees her once a week and dtr says Xray last week showed little pneumonia and she was wondering if antibiotic could be extended and nurse states pt is coughing up yellow sputum.  Other than that she is doing well.  Dtr is scared and wants to be sure pneumonia is resolved.  Also daughter wants to know if repeat Xray will be done?

## 2020-10-03 DIAGNOSIS — J9621 Acute and chronic respiratory failure with hypoxia: Secondary | ICD-10-CM | POA: Diagnosis not present

## 2020-10-03 DIAGNOSIS — J189 Pneumonia, unspecified organism: Secondary | ICD-10-CM | POA: Diagnosis not present

## 2020-10-03 DIAGNOSIS — A419 Sepsis, unspecified organism: Secondary | ICD-10-CM | POA: Diagnosis not present

## 2020-10-03 DIAGNOSIS — J440 Chronic obstructive pulmonary disease with acute lower respiratory infection: Secondary | ICD-10-CM | POA: Diagnosis not present

## 2020-10-03 DIAGNOSIS — J441 Chronic obstructive pulmonary disease with (acute) exacerbation: Secondary | ICD-10-CM | POA: Diagnosis not present

## 2020-10-03 DIAGNOSIS — D649 Anemia, unspecified: Secondary | ICD-10-CM | POA: Diagnosis not present

## 2020-10-04 DIAGNOSIS — J440 Chronic obstructive pulmonary disease with acute lower respiratory infection: Secondary | ICD-10-CM | POA: Diagnosis not present

## 2020-10-04 DIAGNOSIS — J9621 Acute and chronic respiratory failure with hypoxia: Secondary | ICD-10-CM | POA: Diagnosis not present

## 2020-10-04 DIAGNOSIS — J189 Pneumonia, unspecified organism: Secondary | ICD-10-CM | POA: Diagnosis not present

## 2020-10-04 DIAGNOSIS — D649 Anemia, unspecified: Secondary | ICD-10-CM | POA: Diagnosis not present

## 2020-10-04 DIAGNOSIS — J441 Chronic obstructive pulmonary disease with (acute) exacerbation: Secondary | ICD-10-CM | POA: Diagnosis not present

## 2020-10-04 DIAGNOSIS — A419 Sepsis, unspecified organism: Secondary | ICD-10-CM | POA: Diagnosis not present

## 2020-10-05 ENCOUNTER — Telehealth: Payer: Self-pay | Admitting: Family Medicine

## 2020-10-05 DIAGNOSIS — J441 Chronic obstructive pulmonary disease with (acute) exacerbation: Secondary | ICD-10-CM | POA: Diagnosis not present

## 2020-10-05 DIAGNOSIS — J189 Pneumonia, unspecified organism: Secondary | ICD-10-CM | POA: Diagnosis not present

## 2020-10-05 DIAGNOSIS — J440 Chronic obstructive pulmonary disease with acute lower respiratory infection: Secondary | ICD-10-CM | POA: Diagnosis not present

## 2020-10-05 DIAGNOSIS — A419 Sepsis, unspecified organism: Secondary | ICD-10-CM | POA: Diagnosis not present

## 2020-10-05 DIAGNOSIS — J9621 Acute and chronic respiratory failure with hypoxia: Secondary | ICD-10-CM | POA: Diagnosis not present

## 2020-10-05 DIAGNOSIS — D649 Anemia, unspecified: Secondary | ICD-10-CM | POA: Diagnosis not present

## 2020-10-05 NOTE — Telephone Encounter (Signed)
[facility] with [facility] called and said pt was visibly SOB when she went to visit today. She said pts O2 dropped to 90 when she walks. She listened to her right side and said she could hear a groaning sound when she breaths in and out. She thinks pt may need another antibiotic or a steroid.

## 2020-10-06 ENCOUNTER — Other Ambulatory Visit: Payer: Self-pay

## 2020-10-06 MED ORDER — ALBUTEROL SULFATE HFA 108 (90 BASE) MCG/ACT IN AERS: 2.0000 | INHALATION_SPRAY | RESPIRATORY_TRACT | 0 refills | Status: DC | PRN

## 2020-10-06 NOTE — Telephone Encounter (Signed)
I called Nurse Amy to follow up and she states she hasn't seen pt today but when she talked to daughter she was able the same.  Amy was out for couple of days so pt has only been on the doxycycline since yesterday.  States daughter says pt was on Doxycycline prior to being in hospital and it didn't work for her and is concerned it may not work again. She was on Cefdinir 300 mg 1 bid for 5 days and prednisone taper from hospital and that helped her a lot.  Nurse Amy is going by to see her shortly & will call with an update.

## 2020-10-06 NOTE — Telephone Encounter (Signed)
Called Amy back & she states pt's lungs sounded little bit better today, pt still not feeling well, Oxygen 93 on 2 liters, no fever, no distress but she thinks pt needs to be on steroids and possibly change to the Cefdinir to avoid another hospital stay.

## 2020-10-06 NOTE — Telephone Encounter (Signed)
[name] was advised. That inhaler was sent  in and Dr. Redmond School wants to wait and see how the abx that pt is currently on will do before sending in another . [name]

## 2020-10-06 NOTE — Telephone Encounter (Signed)
See how she is doing.  Might need to see her in the office if there is a question

## 2020-10-10 DIAGNOSIS — A419 Sepsis, unspecified organism: Secondary | ICD-10-CM | POA: Diagnosis not present

## 2020-10-10 DIAGNOSIS — D649 Anemia, unspecified: Secondary | ICD-10-CM | POA: Diagnosis not present

## 2020-10-10 DIAGNOSIS — J9621 Acute and chronic respiratory failure with hypoxia: Secondary | ICD-10-CM | POA: Diagnosis not present

## 2020-10-10 DIAGNOSIS — J441 Chronic obstructive pulmonary disease with (acute) exacerbation: Secondary | ICD-10-CM | POA: Diagnosis not present

## 2020-10-10 DIAGNOSIS — J189 Pneumonia, unspecified organism: Secondary | ICD-10-CM | POA: Diagnosis not present

## 2020-10-10 DIAGNOSIS — J440 Chronic obstructive pulmonary disease with acute lower respiratory infection: Secondary | ICD-10-CM | POA: Diagnosis not present

## 2020-10-11 ENCOUNTER — Other Ambulatory Visit: Payer: Medicare Other

## 2020-10-11 ENCOUNTER — Other Ambulatory Visit: Payer: Self-pay

## 2020-10-11 VITALS — BP 138/72 | HR 88 | Resp 24

## 2020-10-11 DIAGNOSIS — Z515 Encounter for palliative care: Secondary | ICD-10-CM

## 2020-10-11 NOTE — Progress Notes (Signed)
PATIENT NAME: [name] DOB: 02-24-1932 MRN: [id number]  PRIMARY CARE PROVIDER: Denita Lung, MD  RESPONSIBLE PARTY:  Acct ID - Guarantor Home Phone Work Phone Relationship Acct Type  [id number] - [phone]  Self P/F     [place], [name], [place] [address]    PLAN OF CARE and INTERVENTIONS:               1.  GOALS OF CARE/ ADVANCE CARE PLANNING: Patient desires to remain home and independent with hired caregivers.               2.  PATIENT/CAREGIVER EDUCATION:  Palliative Care Services, Safety and Pneumonia               3.  DISEASE STATUS:  Joint visit with [place], SW.  Patient is found sitting at the kitchen table for this visit.  She is engaging in conversation and has advised of recent chest x-ray that is still showing pneumonia present.  Patient is currently on another round of antibiotics. She will likely have a repeat chest x-ray in 3-4 weeks to see if the pneumonia has resolved.  Shortness of breath is noted with exertion but this resolves with rest.  Patient is continuing with a productive cough of yellow sputum.  This is gradually improving per patient.  No wheezing is noted the rescue inhaler has not been used.  Appetite has been good with patient eating 2-3 meals a day.  She is doing some occasionally cooking when she feels well.  She is reporting some insomnia but is taking a nap during the daytime.  Patient states she still has someone cleaning her home on Fridays and another caregiver 2x weekly to assist with appointments and grocery shopping.  Patient requested that a follow up call be completed with her daughter [name].  Phone call made on this visit with update given on patient's status.  Patient continues to be seen by [facility] nurse weekly.  She is also receiving OT services.    Appointment scheduled for next month and re-enforced contacting palliative care with changes or concerns.   HISTORY OF PRESENT ILLNESS:  86 year old female with h/o COPD with  recent hospitalization.  She is being followed by Palliative Care monthly and PRN.  CODE STATUS: Full ADVANCED DIRECTIVES: N MOST FORM: No PPS: 50%   PHYSICAL EXAM:   VITALS: bp 138/72 p 88 r 24 O2 Sat 96% LUNGS: Diminished bilaterally. CARDIAC: HRR EXTREMITIES: No edema present. SKIN: warm and dry to touch.  No skin breakdown reported. NEURO: Alert and oriented some forgetfulness present.       Lorenza Burton, RN

## 2020-10-12 ENCOUNTER — Telehealth: Payer: Self-pay

## 2020-10-12 DIAGNOSIS — D649 Anemia, unspecified: Secondary | ICD-10-CM | POA: Diagnosis not present

## 2020-10-12 DIAGNOSIS — J189 Pneumonia, unspecified organism: Secondary | ICD-10-CM | POA: Diagnosis not present

## 2020-10-12 DIAGNOSIS — J440 Chronic obstructive pulmonary disease with acute lower respiratory infection: Secondary | ICD-10-CM | POA: Diagnosis not present

## 2020-10-12 DIAGNOSIS — J9621 Acute and chronic respiratory failure with hypoxia: Secondary | ICD-10-CM | POA: Diagnosis not present

## 2020-10-12 DIAGNOSIS — A419 Sepsis, unspecified organism: Secondary | ICD-10-CM | POA: Diagnosis not present

## 2020-10-12 DIAGNOSIS — J441 Chronic obstructive pulmonary disease with (acute) exacerbation: Secondary | ICD-10-CM | POA: Diagnosis not present

## 2020-10-12 NOTE — Telephone Encounter (Signed)
Called and talked to the [facility] aid Amy and she said she would let her family know to call the office to schedule an apt.

## 2020-10-12 NOTE — Telephone Encounter (Signed)
The medicine is the same class of drug as the when she mention.  If she has concerns over her health, have Chrys Racer make an appointment for a visit

## 2020-10-12 NOTE — Progress Notes (Signed)
COMMUNITY PALLIATIVE CARE SW NOTE 10/11/2020 '@1pm' .  PATIENT NAME: [name] DOB: [birth year] MRN: [id number]  PRIMARY CARE PROVIDER: Denita Lung, MD  RESPONSIBLE PARTY:  Acct ID - Guarantor Home Phone Work Phone Relationship Acct Type  [id number] - [phone]  Self P/F     [place], [name], [place] [address]     PLAN OF CARE and INTERVENTIONS:             1. GOALS OF CARE/ ADVANCE CARE PLANNING:  Patient is a Full code. Patients goal is to remain in her home as independent as possible.  2. SOCIAL/EMOTIONAL/SPIRITUAL ASSESSMENT/ INTERVENTIONS:  SW and RN Almyra Free met with patient 10/11/2020 in patients home. Patient updated team on current medical status. Patient started on new ABT for PNA scheduled for chest X-Ray next month. Patient still on 2LPM continuously. No other medication changes. Patient eating well. Patient reports hip discomfort, not pain, and takes Tylenol at HS. Patient states that she does not sleep well at HS but does nap during the day. RN reviewed meds and vitals. Patients daughter updated of visit via telephone. Patient has RN and OT trough [facility]. SW discussed goals, reviewed care plan, provided emotional support, used active and reflective listening. Palliative care will continue to monitor and assist with long term care planning as needed.  3. PATIENT/CAREGIVER EDUCATION/ COPING:  Patient A&O. Patient able to answer and address all questions appropriately.  Daughter is supportive. 4. PERSONAL EMERGENCY PLAN:  Patient/family will call 9-1-1 for emergencies.  5. COMMUNITY RESOURCES COORDINATION/ HEALTH CARE NAVIGATION:  Patient manages all care. [facility] provide RN and OT. Patient has house cleaner Q Fri and caregiver 2x/wk for grocery shopping and other needs. 6. FINANCIAL/LEGAL CONCERNS/INTERVENTIONS:  None.      SOCIAL HX:  Social History   Tobacco Use  . Smoking status: Former Smoker    Packs/day: 0.30    Years: 50.00    Pack years:  15.00    Types: Cigarettes    Quit date: 03/05/2013    Years since quitting: 7.6  . Smokeless tobacco: Never Used  Substance Use Topics  . Alcohol use: No    CODE STATUS: Full code  ADVANCED DIRECTIVES: N MOST FORM COMPLETE:  N HOSPICE EDUCATION PROVIDED: N  PPS: Patient is independent with all ADLS. Patient is ambulatory w/o AD but has RW if needed. Patient able to prep her own meals.   Time Spent: 30 min    [name], [place]

## 2020-10-12 NOTE — Telephone Encounter (Signed)
Amy with [facility] called to give an update on the pts. Condition, she has no fever, is coughing up yellow phlegm, and she heard a new crackle in her upper mid back area. She thought she would have been in better shape oon the Doxy by now. She wanted to know if you wanted to change the medication she is on. She was on a med called Cefdinir 300mg  twice daily last time she left the hospital that seemed to work for her. Amy can be reached at [phone].

## 2020-10-13 ENCOUNTER — Other Ambulatory Visit: Payer: Self-pay

## 2020-10-13 ENCOUNTER — Encounter: Payer: Self-pay | Admitting: Family Medicine

## 2020-10-13 ENCOUNTER — Ambulatory Visit (INDEPENDENT_AMBULATORY_CARE_PROVIDER_SITE_OTHER): Payer: Medicare Other | Admitting: Family Medicine

## 2020-10-13 ENCOUNTER — Ambulatory Visit
Admission: RE | Admit: 2020-10-13 | Discharge: 2020-10-13 | Disposition: A | Discharge status: Home / Self Care | Payer: Medicare Other | Source: Ambulatory Visit | Attending: Family Medicine | Admitting: Family Medicine

## 2020-10-13 VITALS — BP 144/82 | HR 105 | Temp 98.7°F | Wt 132.8 lb

## 2020-10-13 DIAGNOSIS — J449 Chronic obstructive pulmonary disease, unspecified: Secondary | ICD-10-CM

## 2020-10-13 DIAGNOSIS — J189 Pneumonia, unspecified organism: Secondary | ICD-10-CM

## 2020-10-13 DIAGNOSIS — J9612 Chronic respiratory failure with hypercapnia: Secondary | ICD-10-CM | POA: Diagnosis not present

## 2020-10-13 DIAGNOSIS — R0609 Other forms of dyspnea: Secondary | ICD-10-CM | POA: Diagnosis not present

## 2020-10-13 MED ORDER — CEFDINIR 300 MG PO CAPS: 300.0000 mg | ORAL_CAPSULE | Freq: Two times a day (BID) | ORAL | 0 refills | Status: DC

## 2020-10-13 NOTE — Progress Notes (Signed)
° °  Subjective:    Patient ID: [name], female    DOB: 1932/07/11, 86 y.o.   MRN: [id number]  HPI She is here for recheck.  She has been on doxycycline for approximately 1 week and did note some initial improvement but in the last several days feels as if she is not made continued progress.  She continues on her medications listed in chart.  She has not had to use albuterol.  No fever, chills or worsening shortness of breath.   Review of Systems     Objective:   Physical Exam Alert and in no distress.  Pulse ox is ninety-five.  Respiratory rate appears normal.  Lungs show slightly decreased breath sounds in the left upper lobe.  Harsh breath sounds are noted.  Cardiac exam shows regular rhythm without murmurs or gallops. Review of previous x-ray did show incomplete resolution and recommendation for follow-up film.      Assessment & Plan:  Pneumonia due to infectious organism, unspecified laterality, unspecified part of lung - Plan: DG Chest 2 View, cefdinir (OMNICEF) 300 MG capsule  COPD mixed type ([place]) - Plan: DG Chest 2 View, cefdinir (OMNICEF) 300 MG capsule  Chronic respiratory failure with hypercapnia ([place]) - Plan: DG Chest 2 View, cefdinir (OMNICEF) 300 MG capsule I will switch her to [facility] which seems to have been a good choice for her in the past.  Recheck x-ray

## 2020-10-14 DIAGNOSIS — D649 Anemia, unspecified: Secondary | ICD-10-CM | POA: Diagnosis not present

## 2020-10-14 DIAGNOSIS — J189 Pneumonia, unspecified organism: Secondary | ICD-10-CM | POA: Diagnosis not present

## 2020-10-14 DIAGNOSIS — J441 Chronic obstructive pulmonary disease with (acute) exacerbation: Secondary | ICD-10-CM | POA: Diagnosis not present

## 2020-10-14 DIAGNOSIS — J440 Chronic obstructive pulmonary disease with acute lower respiratory infection: Secondary | ICD-10-CM | POA: Diagnosis not present

## 2020-10-14 DIAGNOSIS — J9621 Acute and chronic respiratory failure with hypoxia: Secondary | ICD-10-CM | POA: Diagnosis not present

## 2020-10-14 DIAGNOSIS — A419 Sepsis, unspecified organism: Secondary | ICD-10-CM | POA: Diagnosis not present

## 2020-10-18 DIAGNOSIS — J9621 Acute and chronic respiratory failure with hypoxia: Secondary | ICD-10-CM | POA: Diagnosis not present

## 2020-10-18 DIAGNOSIS — A419 Sepsis, unspecified organism: Secondary | ICD-10-CM | POA: Diagnosis not present

## 2020-10-18 DIAGNOSIS — D649 Anemia, unspecified: Secondary | ICD-10-CM | POA: Diagnosis not present

## 2020-10-18 DIAGNOSIS — J441 Chronic obstructive pulmonary disease with (acute) exacerbation: Secondary | ICD-10-CM | POA: Diagnosis not present

## 2020-10-18 DIAGNOSIS — J440 Chronic obstructive pulmonary disease with acute lower respiratory infection: Secondary | ICD-10-CM | POA: Diagnosis not present

## 2020-10-18 DIAGNOSIS — J189 Pneumonia, unspecified organism: Secondary | ICD-10-CM | POA: Diagnosis not present

## 2020-10-19 DIAGNOSIS — A419 Sepsis, unspecified organism: Secondary | ICD-10-CM | POA: Diagnosis not present

## 2020-10-19 DIAGNOSIS — J9621 Acute and chronic respiratory failure with hypoxia: Secondary | ICD-10-CM | POA: Diagnosis not present

## 2020-10-19 DIAGNOSIS — D649 Anemia, unspecified: Secondary | ICD-10-CM | POA: Diagnosis not present

## 2020-10-19 DIAGNOSIS — J189 Pneumonia, unspecified organism: Secondary | ICD-10-CM | POA: Diagnosis not present

## 2020-10-19 DIAGNOSIS — J441 Chronic obstructive pulmonary disease with (acute) exacerbation: Secondary | ICD-10-CM | POA: Diagnosis not present

## 2020-10-19 DIAGNOSIS — J440 Chronic obstructive pulmonary disease with acute lower respiratory infection: Secondary | ICD-10-CM | POA: Diagnosis not present

## 2020-10-21 DIAGNOSIS — J441 Chronic obstructive pulmonary disease with (acute) exacerbation: Secondary | ICD-10-CM | POA: Diagnosis not present

## 2020-10-21 DIAGNOSIS — J440 Chronic obstructive pulmonary disease with acute lower respiratory infection: Secondary | ICD-10-CM | POA: Diagnosis not present

## 2020-10-21 DIAGNOSIS — J9621 Acute and chronic respiratory failure with hypoxia: Secondary | ICD-10-CM | POA: Diagnosis not present

## 2020-10-21 DIAGNOSIS — J189 Pneumonia, unspecified organism: Secondary | ICD-10-CM | POA: Diagnosis not present

## 2020-10-21 DIAGNOSIS — A419 Sepsis, unspecified organism: Secondary | ICD-10-CM | POA: Diagnosis not present

## 2020-10-21 DIAGNOSIS — D649 Anemia, unspecified: Secondary | ICD-10-CM | POA: Diagnosis not present

## 2020-10-24 ENCOUNTER — Telehealth: Payer: Self-pay

## 2020-10-24 DIAGNOSIS — D649 Anemia, unspecified: Secondary | ICD-10-CM | POA: Diagnosis not present

## 2020-10-24 DIAGNOSIS — J441 Chronic obstructive pulmonary disease with (acute) exacerbation: Secondary | ICD-10-CM | POA: Diagnosis not present

## 2020-10-24 DIAGNOSIS — J440 Chronic obstructive pulmonary disease with acute lower respiratory infection: Secondary | ICD-10-CM | POA: Diagnosis not present

## 2020-10-24 DIAGNOSIS — A419 Sepsis, unspecified organism: Secondary | ICD-10-CM | POA: Diagnosis not present

## 2020-10-24 DIAGNOSIS — J9621 Acute and chronic respiratory failure with hypoxia: Secondary | ICD-10-CM | POA: Diagnosis not present

## 2020-10-24 DIAGNOSIS — J189 Pneumonia, unspecified organism: Secondary | ICD-10-CM | POA: Diagnosis not present

## 2020-10-24 NOTE — Telephone Encounter (Signed)
Amy request one time a week for nine weeks to have lungs check . Please advise . [place]

## 2020-10-24 NOTE — Telephone Encounter (Signed)
ok 

## 2020-10-25 ENCOUNTER — Other Ambulatory Visit: Payer: Self-pay

## 2020-10-25 ENCOUNTER — Ambulatory Visit (INDEPENDENT_AMBULATORY_CARE_PROVIDER_SITE_OTHER): Payer: Medicare Other | Admitting: Podiatry

## 2020-10-25 ENCOUNTER — Encounter: Payer: Self-pay | Admitting: Podiatry

## 2020-10-25 DIAGNOSIS — J440 Chronic obstructive pulmonary disease with acute lower respiratory infection: Secondary | ICD-10-CM | POA: Diagnosis not present

## 2020-10-25 DIAGNOSIS — J9621 Acute and chronic respiratory failure with hypoxia: Secondary | ICD-10-CM | POA: Diagnosis not present

## 2020-10-25 DIAGNOSIS — M79676 Pain in unspecified toe(s): Secondary | ICD-10-CM | POA: Diagnosis not present

## 2020-10-25 DIAGNOSIS — Z7952 Long term (current) use of systemic steroids: Secondary | ICD-10-CM | POA: Diagnosis not present

## 2020-10-25 DIAGNOSIS — I2781 Cor pulmonale (chronic): Secondary | ICD-10-CM | POA: Diagnosis not present

## 2020-10-25 DIAGNOSIS — B351 Tinea unguium: Secondary | ICD-10-CM | POA: Diagnosis not present

## 2020-10-25 DIAGNOSIS — Z9981 Dependence on supplemental oxygen: Secondary | ICD-10-CM | POA: Diagnosis not present

## 2020-10-25 DIAGNOSIS — K644 Residual hemorrhoidal skin tags: Secondary | ICD-10-CM | POA: Diagnosis not present

## 2020-10-25 DIAGNOSIS — D649 Anemia, unspecified: Secondary | ICD-10-CM | POA: Diagnosis not present

## 2020-10-25 DIAGNOSIS — N289 Disorder of kidney and ureter, unspecified: Secondary | ICD-10-CM | POA: Diagnosis not present

## 2020-10-25 DIAGNOSIS — F419 Anxiety disorder, unspecified: Secondary | ICD-10-CM | POA: Diagnosis not present

## 2020-10-25 DIAGNOSIS — K5790 Diverticulosis of intestine, part unspecified, without perforation or abscess without bleeding: Secondary | ICD-10-CM | POA: Diagnosis not present

## 2020-10-25 DIAGNOSIS — J441 Chronic obstructive pulmonary disease with (acute) exacerbation: Secondary | ICD-10-CM | POA: Diagnosis not present

## 2020-10-25 DIAGNOSIS — J189 Pneumonia, unspecified organism: Secondary | ICD-10-CM | POA: Diagnosis not present

## 2020-10-25 DIAGNOSIS — Z9049 Acquired absence of other specified parts of digestive tract: Secondary | ICD-10-CM | POA: Diagnosis not present

## 2020-10-25 DIAGNOSIS — M810 Age-related osteoporosis without current pathological fracture: Secondary | ICD-10-CM | POA: Diagnosis not present

## 2020-10-25 DIAGNOSIS — Z87891 Personal history of nicotine dependence: Secondary | ICD-10-CM | POA: Diagnosis not present

## 2020-10-25 DIAGNOSIS — A419 Sepsis, unspecified organism: Secondary | ICD-10-CM | POA: Diagnosis not present

## 2020-10-25 NOTE — Telephone Encounter (Signed)
Done Kh 

## 2020-10-25 NOTE — Progress Notes (Signed)
This patient returns to my office for at risk foot care.  This patient requires this care by a professional since this patient will be at risk due to having  renal insufficiency.  This patient is unable to cut nails herself since the patient cannot reach her nails.These nails are painful walking and wearing shoes.  This patient presents for at risk foot care today.  General Appearance  Alert, conversant and in no acute stress.  Vascular  Dorsalis pedis and posterior tibial  pulses are weakly  palpable  bilaterally.  Capillary return is within normal limits  bilaterally. Temperature is within normal limits  bilaterally.  Neurologic  Senn-Weinstein monofilament wire test diminished   bilaterally. Muscle power within normal limits bilaterally.  Nails Thick disfigured discolored nails with subungual debris  from hallux to fifth toes bilaterally. No evidence of bacterial infection or drainage bilaterally.  Orthopedic  No limitations of motion  feet .  No crepitus or effusions noted.  No bony pathology or digital deformities noted.  Skin  normotropic skin with no porokeratosis noted bilaterally.  No signs of infections or ulcers noted.     Onychomycosis  Pain in right toes  Pain in left toes  Consent was obtained for treatment procedures.   Mechanical debridement of nails 1-5  bilaterally performed with a nail nipper.  Filed with dremel without incident.    Return office visit   3 months                   Told patient to return for periodic foot care and evaluation due to potential at risk complications.   Gardiner Barefoot DPM

## 2020-11-01 ENCOUNTER — Ambulatory Visit: Payer: Medicare Other | Admitting: Podiatry

## 2020-11-08 ENCOUNTER — Other Ambulatory Visit: Payer: Medicare Other

## 2020-11-08 ENCOUNTER — Other Ambulatory Visit: Payer: Self-pay

## 2020-11-08 VITALS — BP 140/86 | HR 78 | Temp 97.4°F

## 2020-11-08 DIAGNOSIS — Z515 Encounter for palliative care: Secondary | ICD-10-CM

## 2020-11-08 NOTE — Progress Notes (Signed)
PATIENT NAME: [name] DOB: 1932/10/13 MRN: [id number]  PRIMARY CARE PROVIDER: Denita Lung, MD  RESPONSIBLE PARTY:  Acct ID - Guarantor Home Phone Work Phone Relationship Acct Type  [id number] - [phone]  Self P/F     [place], [name], [place] [address]    PLAN OF CARE and INTERVENTIONS:               1.  GOALS OF CARE/ ADVANCE CARE PLANNING:  Patient desires to remain in the home and independent. She continues to have hired caregivers and [facility] nursing and PT services.               2.  PATIENT/CAREGIVER EDUCATION:  Safety and PC Services               3. PERSONAL EMERGENCY PLAN:  Patient will activate 911 for emergencies.               4.  DISEASE STATUS: Joint visit with [place], SW.  Greeted at the door by patient.  She is engaging in conversation and provides an update since our last visit.  Patient continues with shortness of breath with exertion.  She reports a productive cough at times.  Occasionally yellow to clear sputum is noted.  Patient is currently on O2 at 2 L via [place].  She will have a repeat CXR this Sunday to follow up on pneumonia.  Overall, patient feels like she is doing better.  Patient notes she is stronger with the help of physical therapy.  No falls are reported.  She denies any dizziness, chest pain or nausea/vomitting.   She is sleeping well at night.  No changes with her appetite.  Patient reports doing some cooking on her own.  She notes she is taking her time with activites and resting when needed.  Re-enforced Palliative Care services and scheduled next appointment for December 14 @ 1pm.    HISTORY OF PRESENT ILLNESS:  86 year old female with dx of COPD.  Patient is being followed by Palliative Care monthly and PRN.  CODE STATUS: Full ADVANCED DIRECTIVES: No MOST FORM: No PPS: 50%   PHYSICAL EXAM:   VITALS: BP 140/86 P 78 O2 sats 98% on 2 L via [place]. LUNGS: Right lower lobe:  rhonchi CARDIAC: HRR EXTREMITIES: No edema. SKIN:  Warm and dry to touch.  No skin breakdown noted.  NEURO:  Alert and oriented x 3       Lorenza Burton, RN

## 2020-11-08 NOTE — Progress Notes (Signed)
COMMUNITY PALLIATIVE CARE SW NOTE  PATIENT NAME: [name] DOB: 24-Feb-1932 MRN: [id number]  PRIMARY CARE PROVIDER: Denita Lung, MD  RESPONSIBLE PARTY:  Acct ID - Guarantor Home Phone Work Phone Relationship Acct Type  [id number] - [name],[phone]  Self P/F     [place], [name], [place] [address]     PLAN OF CARE and INTERVENTIONS:             1. GOALS OF CARE/ ADVANCE CARE PLANNING:  Patient is a Full code. Patients goal is to remain in her home as independent as possible.  2.         SOCIAL/EMOTIONAL/SPIRITUAL ASSESSMENT/ INTERVENTIONS:  SW and RN [name] met with patient in patients home for monthly palliative care visit. Patient updated team on current medical status. Patient has ended ABT for PNA  and has chest X-Ray scheduled for 11/21. Patient still on 2LPM continuously. No other medication changes. Patient eating well. Patient reports no pain at this time. Patient states that she does not sleep well at HS but does nap during the day. RN reviewed meds and vitals. Patient states that overall she is feeling better but is worried about chest xray to see if PNA has cleared.  Patient has RN and OT trough [facility]. Patient shared that her daughter will be visiting for a week after Thanksgiving. SW discussed goals, reviewed care plan, provided emotional support, used active and reflective listening. Palliative care will continue to monitor and assist with long term care planning as needed.  3.         PATIENT/CAREGIVER EDUCATION/ COPING:  Patient A&O. Patient able to answer and address all questions appropriately.  Daughter is supportive. 4.         PERSONAL EMERGENCY PLAN:  Patient/family will call 9-1-1 for emergencies.  5.         COMMUNITY RESOURCES COORDINATION/ HEALTH CARE NAVIGATION:  Patient manages all care. [facility] provide RN and OT. Patient has house cleaner Q Fri and caregiver 2x/wk for grocery shopping and other needs. 6.         FINANCIAL/LEGAL  CONCERNS/INTERVENTIONS:  None.     SOCIAL HX:  Social History   Tobacco Use  . Smoking status: Former Smoker    Packs/day: 0.30    Years: 50.00    Pack years: 15.00    Types: Cigarettes    Quit date: 03/05/2013    Years since quitting: 7.6  . Smokeless tobacco: Never Used  Substance Use Topics  . Alcohol use: No    CODE STATUS: Full code ADVANCED DIRECTIVES: N MOST FORM COMPLETE: N HOSPICE EDUCATION PROVIDED: N  TIR:[id number] is independent with all ADLS. Patient is ambulatory w/o AD but has RW if needed. Patient able to prep her own meals.  Time spent: 30 min       [place], [place]

## 2020-11-11 ENCOUNTER — Telehealth: Payer: Self-pay

## 2020-11-11 NOTE — Telephone Encounter (Signed)
no

## 2020-11-11 NOTE — Telephone Encounter (Signed)
Pt wanted to kow if she has to have another xray when she finishes her abx. [place] advise. [place]

## 2020-11-14 NOTE — Telephone Encounter (Signed)
Pt was advise Kh 

## 2020-11-18 ENCOUNTER — Encounter: Payer: Self-pay | Admitting: Pulmonary Disease

## 2020-11-18 ENCOUNTER — Telehealth: Payer: Self-pay | Admitting: Pulmonary Disease

## 2020-11-18 DIAGNOSIS — R059 Cough, unspecified: Secondary | ICD-10-CM

## 2020-11-18 DIAGNOSIS — J449 Chronic obstructive pulmonary disease, unspecified: Secondary | ICD-10-CM

## 2020-11-18 MED ORDER — DOXYCYCLINE HYCLATE 100 MG PO TABS: 100.0000 mg | ORAL_TABLET | Freq: Two times a day (BID) | ORAL | 0 refills | Status: DC

## 2020-11-18 NOTE — Telephone Encounter (Signed)
11/18/20   Received telephone call from Dr. Melvyn Novas patient regarding worsened cough and congestion.  She reports she is afebrile.  Most recent temperature being 18 F about an hour ago.  She also reports but oxygen levels have been stable at 96%.  She is coughing up more thick purulent yellow mucus.  She reports that this started yesterday.  Patient is up-to-date with COVID-19 vaccinations  Patient was recently hospitalized for pneumonia.  She was treated with Omnicef and doxycycline.  She reports that she finished his antibiotics around the end of October/2021  Patient able to talk in clear sentences.  No obvious respiratory distress over telephone call.  Plan: -We will treat patient with doxycycline today -we will not do Levaquin given that it is an allergy, patient unable to clarify for me if this allergy is accurate -Patient reporting no wheezing, continue Trelegy Ellipta, will hold off on prednisone at this time -Encourage patient to continue to monitor oxygen levels as well as temperature, if fever persists or oxygen levels are dropping below 88% she needs to contact 911 or seek emergent evaluation -Will route message to triage team to contact the patient next week to schedule in person evaluation for close follow-up and monitoring -We will route message to Dr. Melvyn Novas as Juluis Rainier

## 2020-11-21 ENCOUNTER — Telehealth: Payer: Self-pay | Admitting: Pulmonary Disease

## 2020-11-21 ENCOUNTER — Ambulatory Visit: Payer: Medicare Other

## 2020-11-21 NOTE — Telephone Encounter (Signed)
Called and spoke with Amy from [place] and stated to her the info from prior call earlier with pt. Stated to Amy that pt is being started on doxycycline and has been scheduled for a visit 12/2 with cxr prior. Stated to her if pt does become worse prior to that appt she will need to seek emergent evaluation. Amy verbalized understanding. Nothing further needed.

## 2020-11-21 NOTE — Telephone Encounter (Signed)
Spoke with pt who scheduled OV with Aaron Edelman on 11/24/20 at 11:00. Aaron Edelman wants CXR prior to visit. Pt informed of this and states understanding. CXR ordered. Nothing further needed at this time.

## 2020-11-24 ENCOUNTER — Encounter: Payer: Self-pay | Admitting: Pulmonary Disease

## 2020-11-24 ENCOUNTER — Ambulatory Visit (INDEPENDENT_AMBULATORY_CARE_PROVIDER_SITE_OTHER): Payer: Medicare Other

## 2020-11-24 ENCOUNTER — Other Ambulatory Visit: Payer: Self-pay

## 2020-11-24 ENCOUNTER — Ambulatory Visit (INDEPENDENT_AMBULATORY_CARE_PROVIDER_SITE_OTHER): Payer: Medicare Other | Admitting: Pulmonary Disease

## 2020-11-24 VITALS — BP 136/80 | HR 80 | Temp 97.5°F | Ht 67.0 in | Wt 135.0 lb

## 2020-11-24 DIAGNOSIS — R918 Other nonspecific abnormal finding of lung field: Secondary | ICD-10-CM

## 2020-11-24 DIAGNOSIS — K5790 Diverticulosis of intestine, part unspecified, without perforation or abscess without bleeding: Secondary | ICD-10-CM | POA: Diagnosis not present

## 2020-11-24 DIAGNOSIS — F419 Anxiety disorder, unspecified: Secondary | ICD-10-CM | POA: Diagnosis not present

## 2020-11-24 DIAGNOSIS — Z9981 Dependence on supplemental oxygen: Secondary | ICD-10-CM | POA: Diagnosis not present

## 2020-11-24 DIAGNOSIS — J449 Chronic obstructive pulmonary disease, unspecified: Secondary | ICD-10-CM

## 2020-11-24 DIAGNOSIS — I2781 Cor pulmonale (chronic): Secondary | ICD-10-CM | POA: Diagnosis not present

## 2020-11-24 DIAGNOSIS — M810 Age-related osteoporosis without current pathological fracture: Secondary | ICD-10-CM | POA: Diagnosis not present

## 2020-11-24 DIAGNOSIS — J9611 Chronic respiratory failure with hypoxia: Secondary | ICD-10-CM

## 2020-11-24 DIAGNOSIS — J189 Pneumonia, unspecified organism: Secondary | ICD-10-CM | POA: Diagnosis not present

## 2020-11-24 DIAGNOSIS — A419 Sepsis, unspecified organism: Secondary | ICD-10-CM | POA: Diagnosis not present

## 2020-11-24 DIAGNOSIS — J9621 Acute and chronic respiratory failure with hypoxia: Secondary | ICD-10-CM | POA: Diagnosis not present

## 2020-11-24 DIAGNOSIS — Z9181 History of falling: Secondary | ICD-10-CM | POA: Diagnosis not present

## 2020-11-24 DIAGNOSIS — J440 Chronic obstructive pulmonary disease with acute lower respiratory infection: Secondary | ICD-10-CM | POA: Diagnosis not present

## 2020-11-24 DIAGNOSIS — R059 Cough, unspecified: Secondary | ICD-10-CM | POA: Diagnosis not present

## 2020-11-24 DIAGNOSIS — Z7951 Long term (current) use of inhaled steroids: Secondary | ICD-10-CM | POA: Diagnosis not present

## 2020-11-24 DIAGNOSIS — Z87891 Personal history of nicotine dependence: Secondary | ICD-10-CM | POA: Diagnosis not present

## 2020-11-24 DIAGNOSIS — R0602 Shortness of breath: Secondary | ICD-10-CM | POA: Diagnosis not present

## 2020-11-24 DIAGNOSIS — D649 Anemia, unspecified: Secondary | ICD-10-CM | POA: Diagnosis not present

## 2020-11-24 DIAGNOSIS — K644 Residual hemorrhoidal skin tags: Secondary | ICD-10-CM | POA: Diagnosis not present

## 2020-11-24 DIAGNOSIS — Z9049 Acquired absence of other specified parts of digestive tract: Secondary | ICD-10-CM | POA: Diagnosis not present

## 2020-11-24 LAB — COMPREHENSIVE METABOLIC PANEL
ALT: 26 U/L (ref 0–35)
AST: 26 U/L (ref 0–37)
Albumin: 3.8 g/dL (ref 3.5–5.2)
Alkaline Phosphatase: 80 U/L (ref 39–117)
BUN: 11 mg/dL (ref 6–23)
CO2: 31 mEq/L (ref 19–32)
Calcium: 10.2 mg/dL (ref 8.4–10.5)
Chloride: 99 mEq/L (ref 96–112)
Creatinine, Ser: 0.69 mg/dL (ref 0.40–1.20)
GFR: 78.71 mL/min (ref >60.00)
Glucose, Bld: 104 mg/dL — ABNORMAL HIGH (ref 70–99)
Potassium: 4.3 mEq/L (ref 3.5–5.1)
Sodium: 138 mEq/L (ref 135–145)
Total Bilirubin: 0.3 mg/dL (ref 0.2–1.2)
Total Protein: 7.8 g/dL (ref 6.0–8.3)

## 2020-11-24 LAB — CBC WITH DIFFERENTIAL/PLATELET
Basophils Absolute: 0.1 10*3/uL (ref 0.0–0.1)
Basophils Relative: 0.6 % (ref 0.0–3.0)
Eosinophils Absolute: 0.2 10*3/uL (ref 0.0–0.7)
Eosinophils Relative: 1.7 % (ref 0.0–5.0)
HCT: 35 % — ABNORMAL LOW (ref 36.0–46.0)
Hemoglobin: 11.5 g/dL — ABNORMAL LOW (ref 12.0–15.0)
Lymphocytes Relative: 14.3 % (ref 12.0–46.0)
Lymphs Abs: 1.2 10*3/uL (ref 0.7–4.0)
MCHC: 33 g/dL (ref 30.0–36.0)
MCV: 90.4 fl (ref 78.0–100.0)
Monocytes Absolute: 0.5 10*3/uL (ref 0.1–1.0)
Monocytes Relative: 6.2 % (ref 3.0–12.0)
Neutro Abs: 6.7 10*3/uL (ref 1.4–7.7)
Neutrophils Relative %: 77.2 % — ABNORMAL HIGH (ref 43.0–77.0)
Platelets: 446 10*3/uL — ABNORMAL HIGH (ref 150.0–400.0)
RBC: 3.87 Mil/uL (ref 3.87–5.11)
RDW: 15 % (ref 11.5–15.5)
WBC: 8.7 10*3/uL (ref 4.0–10.5)

## 2020-11-24 NOTE — Patient Instructions (Addendum)
You were seen today by Lauraine Rinne, NP  for:   1. Cough  - Respiratory or Resp and Sputum Culture; Future - AFB Culture & Smear; Future - Fungus Culture & Smear; Future  2. Abnormal findings on diagnostic imaging of lung  Chest x-ray today  Lab work today   Based off chest x-ray results may consider repeat CT imaging  3. COPD GOLD III  Trelegy Ellipta  >>> 1 puff daily in the morning >>>rinse mouth out after use  >>> This inhaler contains 3 medications that help manage her respiratory status, contact our office if you cannot afford this medication or unable to remain on this medication  Note your daily symptoms > remember "red flags" for COPD:   >>>Increase in cough >>>increase in sputum production >>>increase in shortness of breath or activity  intolerance.   If you notice these symptoms, please call the office to be seen.    4. Chronic respiratory failure with hypoxia (HCC)  Walked in office today on 2 L via POC (pulse), likely require 4 L pulsed with physical exertion given drops in oxygen level when you are walking  Remember to continue to breathe through your nose when walking   Continue oxygen therapy as prescribed  >>>maintain oxygen saturations greater than 88 percent  >>>if unable to maintain oxygen saturations please contact the office  >>>do not smoke with oxygen  >>>can use nasal saline gel or nasal saline rinses to moisturize nose if oxygen causes dryness   We recommend today:  Orders Placed This Encounter  Procedures   Respiratory or Resp and Sputum Culture    Standing Status:   Future    Standing Expiration Date:   11/24/2021   AFB Culture & Smear    Standing Status:   Future    Standing Expiration Date:   11/24/2021   Fungus Culture & Smear    Standing Status:   Future    Standing Expiration Date:   11/24/2021   CBC with Differential/Platelet    Standing Status:   Future    Standing Expiration Date:   11/24/2021   Comp Met (CMET)     Standing Status:   Future    Standing Expiration Date:   11/24/2021   Pro b natriuretic peptide (BNP)    Standing Status:   Future    Standing Expiration Date:   11/24/2021   Orders Placed This Encounter  Procedures   Respiratory or Resp and Sputum Culture   AFB Culture & Smear   Fungus Culture & Smear   CBC with Differential/Platelet   Comp Met (CMET)   Pro b natriuretic peptide (BNP)   No orders of the defined types were placed in this encounter.   Follow Up:    Return in about 4 weeks (around 12/22/2020), or if symptoms worsen or fail to improve, for Follow up with Dr. Melvyn Novas. 4-6 week follow up with Dr. Melvyn Novas    Notification of test results are managed in the following manner: If there are  any recommendations or changes to the  plan of care discussed in office today,  we will contact you and let you know what they are. If you do not hear from [place], then your results are normal and you can view them through your  MyChart account , or a letter will be sent to you. Thank you again for trusting [place] with your care  - Thank you, [facility]    It is flu season:   >>>  Best ways to protect herself from the flu: Receive the yearly flu vaccine, practice good hand hygiene washing with soap and also using hand sanitizer when available, eat a nutritious meals, get adequate rest, hydrate appropriately       Please contact the office if your symptoms worsen or you have concerns that you are not improving.   Thank you for choosing [facility] for your healthcare, and for allowing [place] to partner with you on your healthcare journey. I am thankful to be able to provide care to you today.   Wyn Quaker FNP-C

## 2020-11-24 NOTE — Assessment & Plan Note (Signed)
Previous history of right upper lobe and right lower lobe multifocal pneumonia in August/2021  Plan: Chest x-ray today Lab work today Walk today in office Sputum cultures today AFB and fungal cultures as well

## 2020-11-24 NOTE — Assessment & Plan Note (Signed)
Plan: Continue oxygen therapy Maintain oxygen saturations above 88% Walk today in office, patient required 4 L via POC, patient also need to be reminded to breathe through her nose 4-week follow-up with our office Chest x-ray today Lab work today

## 2020-11-24 NOTE — Assessment & Plan Note (Signed)
Plan: Chest x-ray today Lab work today [place] today in office  Continue work with PT OT at home Continue work with home health Continue Trelegy Ellipta Continue oxygen therapy

## 2020-11-24 NOTE — Progress Notes (Signed)
@Patient  ID: [name], female    DOB: [birth year], 86 y.o.   MRN: [id number]  Chief Complaint  Patient presents with  . Follow-up    family reports patient gets temporary improvement with antibitotics then "infection" returns. pt c/o dyspnea, productive cough    Referring provider: Denita Lung, MD  HPI:  86 year old female former smoker followed in our office for COPD and chronic respiratory failure  PMH: Recurrent pneumonia, allergic rhinitis Smoker/ Smoking History: Former smoker Maintenance: Trelegy Ellipta Pt of: Dr. Melvyn Novas  11/24/2020  - Visit   86 year old female former smoker found our office for COPD and chronic respiratory failure.  She is established with Dr. Melvyn Novas.  Patient completing follow-up with our office today after contacting our triage line 11/18/2020 reporting acute worsened cough and congestion.  She was afebrile at that time.  She reported stable oxygen levels.  She had previously been discharged from the hospital in early October with pneumonia.  She was treated with Omnicef and doxycycline.  She was treated telephonically on 11/18/2020 with doxycycline.  Patient has a listed Levaquin allergy.  She was unable to communicate if this was a actual allergy or just simply an intolerance.  Patient presenting today with her daughter. They are reporting the patient continues to feel better after being treated with antibiotics and symptoms quickly returned.  Last CT imaging from August/2021 listed below:  No evidence of PE, patchy consolidation of right upper lobe and to lesser extent right lower lobe consistent with multifocal pneumonia.  Patient was hospitalized at this time.  Felt to be septic due to pneumonia.  Was discharged on 08/25/2020.  The discharge summary is listed below:  PCP: Denita Lung, MD  Admit date: 08/20/2020 Discharge date: 08/25/2020  Admitted From: home Disposition:  [place]  Recommendations for Outpatient Follow-up:  1. Follow up  with PCP in 1-2 weeks 2. Please obtain BMP/CBC in one week 3. Please follow up on the following pending results:  Home Health:Yes  Equipment/Devices: none  Discharge Condition: Stable Code Status:   Code Status: Full Code Diet recommendation:     Diet Order                  Diet - low sodium heart healthy            Diet regular Room service appropriate? Yes; Fluid consistency: Thin  Diet effective now                   Brief/Interim Summary: 86 year old female with history of COPD chronic hypoxic respiratory failure on 2 L nasal cannula, tobacco abuse presented with worsening shortness of breath cough. Recently diagnosed with community-acquired pneumonia and was treated with oral antibiotics also a steroid taper but did not improve she seen in the ED. On presentation patient was found to be havingpneumonia and acute on chronic hypoxic respiratory failure Covid negative and was started on IV antibiotics and admitted. Patient was managed for multifocal pneumonia community-acquired pneumonia with IV ceftriaxone and azithromycin completed 5 days of erythromycin.  Deconditioned weak and frail seen by PT OT. Patient at this time will be needing 3 L nasal cannula with which is able to ambulate 75 feet with physical therapy did pretty well.  Patient feels comfortable going home today, with daughter at the bedside and daughter will be helping her, will be arranging home health PT OT.  Discharge Diagnoses:  Principal Problem:   Sepsis due to pneumonia [facility]) Active Problems:   Former  smoker   COPD GOLD III   Acute on chronic respiratory failure with hypoxia (HCC)   COPD exacerbation (HCC)  Multifocal pneumonia community-acquired, failed outpatient oral antibiotic. Clinically improved, afebrile, WBC count is improved.  She will be discharged on oral cefdinir, completed 5 days of azithromycin, will taper her prednisone over the next few days.  She will continue inhaler, she  will need follow-up chest x-ray in 4 weeks discussed the daughter  Sepsis present on admission due to pneumoniawith lactic acidosis tachypnea and leukocytosis. Overall improved. stable.  Debility/deconditioningcontinue PT OT.Patient will be going with her daughter post discharge.  Acute on chronic respiratory failure with hypoxiasecondary to pneumonia currently on2.5- 3 L  COPDwith mild exacerbationcontinue oral prednisone taper, home inhalers  Anemialikely from chronic disease hemoglobin is stable  Anxiety disorder:Patient endorses  Significant has improvement with low-dose Xanax, daughter requesting 0.125 mg as needed basis and she will follow up with PCP.   Patient was discharged from the hospital with [facility] and doxycycline.  She contacted our office late November/2021 reporting increased cough and congestion.  She remains afebrile.  She was treated with doxycycline again.  Patient reports that she feels better on antibiotics.  She has 1-1/2 days left of doxycycline to complete.  She continues to deny fevers.  She does have cough and congestion.  They have also noticed increased oxygen needs.  She has not had repeat chest imaging.  Will obtain this today.  She remains adherent to Trelegy Ellipta.  Walk today in office patient was only able to walk short distance but did have hypoxemia on 2 L to be her POC.  She was encouraged to increase this to 4 L via her POC and to breathe through her nose.  Questionaires / Pulmonary Flowsheets:   ACT:  No flowsheet data found.  MMRC: No flowsheet data found.  Epworth:  No flowsheet data found.  Tests:   FENO:  No results found for: NITRICOXIDE  PFT: PFT Results Latest Ref Rng & Units 08/31/2015 06/08/2015 03/10/2015 12/13/2014 09/21/2014 08/24/2014 08/09/2014  FVC-Pre L 1.92 1.82 2.10 1.89 1.86 1.75 1.98  FVC-Predicted Pre % 65 61 70 63 62 58 66  FVC-Post L 1.86 1.88 2.11 1.90 1.89 1.92 2.18  FVC-Predicted Post % 63 63 71  63 63 64 72  Pre FEV1/FVC % % 42 43 46 43 45 41 44  Post FEV1/FCV % % 42 44 47 45 47 39 46  FEV1-Pre L 0.81 0.78 0.97 0.81 0.83 0.72 0.86  FEV1-Predicted Pre % 36 35 43 36 37 32 38  FEV1-Post L 0.79 0.82 0.99 0.85 0.90 0.75 1.00    WALK:  SIX MIN WALK 11/24/2020 05/14/2019 01/05/2013 01/05/2013  Supplimental Oxygen during Test? (L/min) - Yes Yes No  O2 Flow Rate - 2 2 -  Type - Continuous Continuous -  Tech Comments: Dropped to 84% with half lap and pt had to rest. After 4 minute rest, sats to 90% and patient did not want to continue walk. Back to room via WC. Pt back to baseline after another 2 minutes with no complaints voiced. Aaron Edelman aware. started walking on Room air, patient dropped sats mid lap 1 to 84% on room air, her heart rate got up to 140 at this time. patient was put 2L o@ on and sat in a chair. then patient was able to finish lap and maintain o2 sats. no complaints of dizziness just shortness of breath  - -    Imaging: No results found.  Lab Results:  CBC    Component Value Date/Time   WBC 9.7 09/01/2020 1200   WBC 8.1 08/23/2020 0500   RBC 4.08 09/01/2020 1200   RBC 3.43 (L) 08/23/2020 0500   HGB 11.4 09/01/2020 1200   HCT 35.8 09/01/2020 1200   PLT 314 09/01/2020 1200   MCV 88 09/01/2020 1200   MCH 27.9 09/01/2020 1200   MCH 28.3 08/23/2020 0500   MCHC 31.8 09/01/2020 1200   MCHC 30.4 08/23/2020 0500   RDW 14.9 09/01/2020 1200   LYMPHSABS 1.0 09/01/2020 1200   MONOABS 0.3 08/23/2020 0500   EOSABS 0.0 09/01/2020 1200   BASOSABS 0.1 09/01/2020 1200    BMET    Component Value Date/Time   NA 139 09/05/2020 1130   K 4.5 09/05/2020 1130   CL 98 09/05/2020 1130   CO2 29 09/05/2020 1130   GLUCOSE 103 (H) 09/05/2020 1130   GLUCOSE 101 (H) 08/21/2020 0503   BUN 16 09/05/2020 1130   CREATININE 0.70 09/05/2020 1130   CREATININE 0.81 06/27/2016 1445   CALCIUM 9.8 09/05/2020 1130   GFRNONAA 79 09/05/2020 1130   GFRAA 91 09/05/2020 1130    BNP    Component  Value Date/Time   BNP 33.1 05/27/2012 1407    ProBNP    Component Value Date/Time   PROBNP 168.8 05/05/2013 1550    Specialty Problems      Pulmonary Problems   COPD GOLD III    Followed in Pulmonary clinic/ [facility]/ [facility]  Quit smoking 02/2013   - PFTs 03/24/2012  FEV1  0.91 (43%) ratio 48 and no better p B2,  DLCO 38% corrects to 53%  - changed to perforomist/bud bid 04/2013  -2015 pulmonary rehab  - med calendar 07/17/13 > did not bring 06/28/2014 , .redone 07/26/2014  - 06/28/2014 p extensive coaching HFA effectiveness =    75% (short Ti) - 08/24/14 started IMPACT trial >finished 08/2015  - PFTs 12/13/14  FEV1 0.85 (38%) ratio 45  p 5 % improvement from saba  09/20/2015 Med calendar  - 05/08/2016 changed lama to incruse  - 11/12/2017  After extensive coaching HFA effectiveness =   75% (short Ti) - 11/12/17 > changed to trelegy          Chronic respiratory failure with hypercapnia ([place])    Followed in Pulmonary clinic/ [facility]/ [facility]   Onset 02 rx 2013 initially just at hs   - ono RA 05/14/12   4 h 33 min < 89%   So ordered 02 2lpm    - HCO3  32 05/10/13    - 01/05/2013   Walked RA x one lap @ 185 stopped due to  88%   - 01/05/2013  Walked 2lpm 2 laps @ 185 ft each stopped due to  90% sob but improved vs baseline   - RA 05/01/2013 sats = 87%  - 05/10/2017 > 02 sats 88% RA > referred for POC eval > preferred continuous flow - 05/14/2019   Walked RA  1 laps @  approx 14ft each @ slow pace  stopped due to sob and desats to 84% corrected on 2lpm and completed lap s desats  rec as of 02/11/2020  2lpm 24/7         Acute on chronic respiratory failure with hypoxia (HCC)   COPD exacerbation ([place])    Followed in Pulmonary clinic/ [facility]/ [facility]  - See admit 05/05/2013  - mild flare onset 05/04/16 > rx zpak only  - mild  flare  11/05/16 rx zpak/ pred       Chronic respiratory failure with hypoxia (HCC)   Sepsis due to pneumonia (HCC)   Cough      Allergies   Allergen Reactions  . Augmentin [Amoxicillin-Pot Clavulanate] Nausea And Vomiting  . Levofloxacin Other (See Comments)    Lowered blood pressre    Immunization History  Administered Date(s) Administered  . DTaP 12/06/1992, 04/27/2004  . Fluad Quad(high Dose 65+) 09/01/2020  . Influenza Split 10/07/2012, 09/11/2013  . Influenza Whole 10/08/2001, 10/02/2002, 09/19/2011, 09/23/2016  . Influenza, High Dose Seasonal PF 09/22/2014, 09/30/2017, 09/17/2018, 09/17/2019  . Influenza,inj,Quad PF,6+ Mos 09/20/2015  . PFIZER SARS-COV-2 Vaccination 01/02/2020, 02/02/2020, 09/01/2020  . Pneumococcal Conjugate-13 05/27/2014  . Pneumococcal Polysaccharide-23 04/07/2013  . Tdap 06/05/2019  . Zoster 12/03/2008    Past Medical History:  Diagnosis Date  . Asthma   . COPD (chronic obstructive pulmonary disease) ([place]) 01/2010   Dr. Melvyn Novas;  . [facility])   . Former smoker    quit 02/2013  . H/O bone density study 2008   osteoporosis, improved density on Fosamax at that time  . Hypoxia 01/2013   hospitalization, Bipap therapy, COPD exacerbation  . Osteoporosis     Tobacco History: Social History   Tobacco Use  Smoking Status Former Smoker  . Packs/day: 0.30  . Years: 50.00  . Pack years: 15.00  . Types: Cigarettes  . Quit date: 03/05/2013  . Years since quitting: 7.7  Smokeless Tobacco Never Used   Counseling given: Not Answered   Continue to not smoke  Outpatient Encounter Medications as of 11/24/2020  Medication Sig  . acetaminophen (TYLENOL) 650 MG CR tablet Take 650 mg by mouth every 8 (eight) hours as needed for pain.  Marland Kitchen albuterol (VENTOLIN HFA) 108 (90 Base) MCG/ACT inhaler Inhale 2 puffs into the lungs every 4 (four) hours as needed for wheezing or shortness of breath (((PLAN A))). Reported on 01/12/2016  . ALPRAZolam (XANAX) 0.25 MG tablet Take 0.5 tablets (0.125 mg total) by mouth daily as needed for up to 10 doses for anxiety.  . brimonidine (ALPHAGAN) 0.2 % ophthalmic  solution Place 1 drop into the right eye 3 (three) times daily.   . Cholecalciferol (VITAMIN D) 50 MCG (2000 UT) CAPS Take 1 capsule by mouth daily.  Marland Kitchen dextromethorphan-guaiFENesin (MUCINEX DM) 30-600 MG per 12 hr tablet Take 1 tablet by mouth at bedtime as needed (with flutter for cough, congestion, and thick mucus).   Marland Kitchen doxycycline (VIBRA-TABS) 100 MG tablet Take 1 tablet (100 mg total) by mouth 2 (two) times daily.  . Fluticasone-Umeclidin-Vilant (TRELEGY ELLIPTA) 100-62.5-25 MCG/INH AEPB Inhale 1 puff into the lungs every morning.  . OXYGEN Use 2L of O2 continuously  . predniSONE (DELTASONE) 10 MG tablet 3 tabs daily x3 days,2 tabs daily x3 days then 1 tab daily x3 days then stop (Patient not taking: Reported on 11/24/2020)   Facility-Administered Encounter Medications as of 11/24/2020  Medication  . influenza  inactive virus vaccine (FLUZONE/FLUARIX) injection 0.5 mL     Review of Systems  Review of Systems  Constitutional: Positive for fatigue. Negative for activity change and fever.  HENT: Positive for congestion. Negative for sinus pressure, sinus pain and sore throat.   Respiratory: Positive for cough. Negative for shortness of breath and wheezing.   Cardiovascular: Negative for chest pain and palpitations.  Gastrointestinal: Negative for diarrhea, nausea and vomiting.  Musculoskeletal: Negative for arthralgias.  Neurological: Negative for dizziness.  Psychiatric/Behavioral: Negative for sleep  disturbance. The patient is not nervous/anxious.      Physical Exam  BP 136/80   Pulse 80   Temp (!) 97.5 F (36.4 C)   Ht 5\' 7"  (1.702 m)   Wt 135 lb (61.2 kg)   SpO2 98%   BMI 21.14 kg/m   Wt Readings from Last 5 Encounters:  11/24/20 135 lb (61.2 kg)  10/13/20 132 lb 12.8 oz (60.2 kg)  09/01/20 133 lb 9.6 oz (60.6 kg)  08/20/20 133 lb 6.1 oz (60.5 kg)  08/17/20 135 lb (61.2 kg)    BMI Readings from Last 5 Encounters:  11/24/20 21.14 kg/m  10/13/20 20.80 kg/m   09/01/20 20.92 kg/m  08/20/20 20.89 kg/m  08/17/20 21.14 kg/m     Physical Exam Vitals and nursing note reviewed.  Constitutional:      General: She is not in acute distress.    Appearance: Normal appearance. She is normal weight.     Comments: Thin frail elderly female  HENT:     Head: Normocephalic and atraumatic.     Right Ear: External ear normal. There is no impacted cerumen.     Left Ear: External ear normal. There is no impacted cerumen.     Nose: Rhinorrhea present. No congestion.     Mouth/Throat:     Mouth: Mucous membranes are moist.     Pharynx: Oropharynx is clear.     Comments: +PND Eyes:     Pupils: Pupils are equal, round, and reactive to light.  Cardiovascular:     Rate and Rhythm: Normal rate and regular rhythm.     Pulses: Normal pulses.     Heart sounds: Normal heart sounds. No murmur heard.   Pulmonary:     Effort: Pulmonary effort is normal. No respiratory distress.     Breath sounds: No decreased air movement. No decreased breath sounds, wheezing or rales.     Comments: Diminished breath sounds throughout exam Musculoskeletal:     Cervical back: Normal range of motion.     Right lower leg: No edema.     Left lower leg: No edema.  Skin:    General: Skin is warm and dry.     Capillary Refill: Capillary refill takes less than 2 seconds.  Neurological:     General: No focal deficit present.     Mental Status: She is alert and oriented to person, place, and time. Mental status is at baseline.     Gait: Gait normal.  Psychiatric:        Mood and Affect: Mood normal.        Behavior: Behavior normal.        Thought Content: Thought content normal.        Judgment: Judgment normal.       Assessment & Plan:   Chronic respiratory failure with hypoxia (HCC) Plan: Continue oxygen therapy Maintain oxygen saturations above 88% Walk today in office, patient required 4 L via POC, patient also need to be reminded to breathe through her  nose 4-week follow-up with our office Chest x-ray today Lab work today   COPD GOLD III Plan: Chest x-ray today Lab work today Walk today in office  Continue work with PT OT at home Continue work with home health Continue Trelegy Ellipta Continue oxygen therapy  Sepsis due to pneumonia ([place]) Covering from hospitalization August/2021  Plan: Chest x-ray today We will obtain sputum cultures  Abnormal findings on diagnostic imaging of lung Previous history of right upper lobe and  right lower lobe multifocal pneumonia in August/2021  Plan: Chest x-ray today Lab work today Walk today in office Sputum cultures today AFB and fungal cultures as well   Cough Concern for recurrent pneumonia as well as potential multidrug-resistant organism Patient has had 3 rounds of doxycycline this year as well as Omnicef Recent CT imaging August/2021 showing right upper and right lower lobe multifocal pneumonia Patient has clinically improved since this treatment but is not yet back to baseline Patient continues to have cough and congestion  Plan: Start daily antihistamine Sputum cultures today Can use Flonase 1 spray each nostril daily as needed for postnasal drip/allergies Chest x-ray today Lab work today     Return in about 4 weeks (around 12/22/2020), or if symptoms worsen or fail to improve, for Follow up with Dr. Melvyn Novas.   Lauraine Rinne, NP 11/24/2020   This appointment required 45 minutes of patient care (this includes precharting, chart review, review of results, face-to-face care, etc.).

## 2020-11-24 NOTE — Assessment & Plan Note (Signed)
Covering from hospitalization August/2021  Plan: Chest x-ray today We will obtain sputum cultures

## 2020-11-24 NOTE — Assessment & Plan Note (Signed)
Concern for recurrent pneumonia as well as potential multidrug-resistant organism Patient has had 3 rounds of doxycycline this year as well as Omnicef Recent CT imaging August/2021 showing right upper and right lower lobe multifocal pneumonia Patient has clinically improved since this treatment but is not yet back to baseline Patient continues to have cough and congestion  Plan: Start daily antihistamine Sputum cultures today Can use Flonase 1 spray each nostril daily as needed for postnasal drip/allergies Chest x-ray today Lab work today

## 2020-11-25 LAB — PRO B NATRIURETIC PEPTIDE: NT-Pro BNP: 148 pg/mL (ref 0–738)

## 2020-11-28 ENCOUNTER — Other Ambulatory Visit: Payer: Self-pay | Admitting: Pulmonary Disease

## 2020-11-29 ENCOUNTER — Other Ambulatory Visit: Payer: Self-pay | Admitting: Pulmonary Disease

## 2020-11-29 MED ORDER — LEVOFLOXACIN 500 MG PO TABS: 500.0000 mg | ORAL_TABLET | Freq: Every day | ORAL | 0 refills | Status: DC

## 2020-11-30 DIAGNOSIS — J440 Chronic obstructive pulmonary disease with acute lower respiratory infection: Secondary | ICD-10-CM | POA: Diagnosis not present

## 2020-11-30 DIAGNOSIS — I2781 Cor pulmonale (chronic): Secondary | ICD-10-CM | POA: Diagnosis not present

## 2020-11-30 DIAGNOSIS — D649 Anemia, unspecified: Secondary | ICD-10-CM | POA: Diagnosis not present

## 2020-11-30 DIAGNOSIS — J9621 Acute and chronic respiratory failure with hypoxia: Secondary | ICD-10-CM | POA: Diagnosis not present

## 2020-11-30 DIAGNOSIS — J189 Pneumonia, unspecified organism: Secondary | ICD-10-CM | POA: Diagnosis not present

## 2020-11-30 DIAGNOSIS — K5790 Diverticulosis of intestine, part unspecified, without perforation or abscess without bleeding: Secondary | ICD-10-CM | POA: Diagnosis not present

## 2020-12-06 ENCOUNTER — Other Ambulatory Visit: Payer: Medicare Other

## 2020-12-06 ENCOUNTER — Other Ambulatory Visit: Payer: Self-pay

## 2020-12-06 VITALS — BP 132/68 | HR 97 | Temp 97.6°F | Wt 135.0 lb

## 2020-12-06 DIAGNOSIS — Z515 Encounter for palliative care: Secondary | ICD-10-CM

## 2020-12-06 NOTE — Progress Notes (Signed)
PATIENT NAME: [name] DOB: 1931-12-25 MRN: [id number]  PRIMARY CARE PROVIDER: Denita Lung, MD  RESPONSIBLE PARTY:  Acct ID - Guarantor Home Phone Work Phone Relationship Acct Type  [id number] - [place],[phone]  Self P/F     [address] [name], [place] [address]    PLAN OF CARE and INTERVENTIONS:               1.  GOALS OF CARE/ ADVANCE CARE PLANNING:  Patient desires to remain home and independent.               2.  PATIENT/CAREGIVER EDUCATION:  Contacting Dr. Warner Mccreedy should symptoms with breathing worsen.                 3.  EMERGENCY PLAN:  Patient will activate 911 for emergencies.                4.  DISEASE STATUS:  Greeted at the door by patient.  She is visibly short of breath with exertion. Pursed lip breathing noted with exertion but patient is able to recover after 5 minutes of rest.  Currently on O2 at 2 L via [place].  Patient provided an update since my last visit.  She recently completed antibiotics due to suspected recurrent pneumonia.  Patient is now on a antihistamine.  She feels this has been helpful.   [facility] is still seeing patient weekly for nursing and PT.    Patient's reports a good po intake.  She is currently eating 2-3 meals a day, snacks occasionally and drinks a supplement drink from time to time.  Weights are currently stable.  Patient reports worsening insomnia while taking Levaquin.  She feels like her sleep pattern is returning to normal now that she has completed her antibiotics.  Patient is ambulating without any assistive devices in the home.  No falls are reported.  We discussed her follow up appointment on December 30 th with Dr. Warner Mccreedy.  Patient is planning on returning for this follow up.  I have instructed her to follow up with provider should symptoms worsen such as coughing, worsening shortness of breath or fever.  Patient verbalized understanding.  HISTORY OF PRESENT ILLNESS:  86 year old female with COPD.  Patient is  currently being followed by Palliative Care monthly and PRN.  CODE STATUS: Full ADVANCED DIRECTIVES: No MOST FORM: No PPS: 50%   PHYSICAL EXAM:   VITALS:  Temp 97.6 F.  BP 132/68 P 97 R 20 O2 sats 96% on 2 L via [place]. LUNGS: Diminished breath sounds, occasional cough with clear to light yellow sputum CARDIAC: HRR EXTREMITIES: No edema present to bilateral lower extremities. SKIN: Warm and dry to touch.  No skin breakdown reported. NEURO: alert and oriented x 3       Lorenza Burton, RN

## 2020-12-07 DIAGNOSIS — K5790 Diverticulosis of intestine, part unspecified, without perforation or abscess without bleeding: Secondary | ICD-10-CM | POA: Diagnosis not present

## 2020-12-07 DIAGNOSIS — J9621 Acute and chronic respiratory failure with hypoxia: Secondary | ICD-10-CM | POA: Diagnosis not present

## 2020-12-07 DIAGNOSIS — J189 Pneumonia, unspecified organism: Secondary | ICD-10-CM | POA: Diagnosis not present

## 2020-12-07 DIAGNOSIS — D649 Anemia, unspecified: Secondary | ICD-10-CM | POA: Diagnosis not present

## 2020-12-07 DIAGNOSIS — I2781 Cor pulmonale (chronic): Secondary | ICD-10-CM | POA: Diagnosis not present

## 2020-12-07 DIAGNOSIS — J440 Chronic obstructive pulmonary disease with acute lower respiratory infection: Secondary | ICD-10-CM | POA: Diagnosis not present

## 2020-12-11 ENCOUNTER — Other Ambulatory Visit: Payer: Self-pay | Admitting: Primary Care

## 2020-12-13 ENCOUNTER — Telehealth: Payer: Self-pay | Admitting: Internal Medicine

## 2020-12-13 DIAGNOSIS — K5790 Diverticulosis of intestine, part unspecified, without perforation or abscess without bleeding: Secondary | ICD-10-CM | POA: Diagnosis not present

## 2020-12-13 DIAGNOSIS — D649 Anemia, unspecified: Secondary | ICD-10-CM | POA: Diagnosis not present

## 2020-12-13 DIAGNOSIS — J9621 Acute and chronic respiratory failure with hypoxia: Secondary | ICD-10-CM | POA: Diagnosis not present

## 2020-12-13 DIAGNOSIS — I2781 Cor pulmonale (chronic): Secondary | ICD-10-CM | POA: Diagnosis not present

## 2020-12-13 DIAGNOSIS — J440 Chronic obstructive pulmonary disease with acute lower respiratory infection: Secondary | ICD-10-CM | POA: Diagnosis not present

## 2020-12-13 DIAGNOSIS — J189 Pneumonia, unspecified organism: Secondary | ICD-10-CM | POA: Diagnosis not present

## 2020-12-13 NOTE — Telephone Encounter (Signed)
Called and spoke with pt about her Trelegy inhaler. Pt stated that things have been able to be fixed with insurance and someone is heading to pharmacy to get her Rx. Nothing further needed.

## 2020-12-14 DIAGNOSIS — J189 Pneumonia, unspecified organism: Secondary | ICD-10-CM | POA: Diagnosis not present

## 2020-12-14 DIAGNOSIS — J440 Chronic obstructive pulmonary disease with acute lower respiratory infection: Secondary | ICD-10-CM | POA: Diagnosis not present

## 2020-12-14 DIAGNOSIS — J9621 Acute and chronic respiratory failure with hypoxia: Secondary | ICD-10-CM | POA: Diagnosis not present

## 2020-12-14 DIAGNOSIS — I2781 Cor pulmonale (chronic): Secondary | ICD-10-CM | POA: Diagnosis not present

## 2020-12-14 DIAGNOSIS — D649 Anemia, unspecified: Secondary | ICD-10-CM | POA: Diagnosis not present

## 2020-12-14 DIAGNOSIS — K5790 Diverticulosis of intestine, part unspecified, without perforation or abscess without bleeding: Secondary | ICD-10-CM | POA: Diagnosis not present

## 2020-12-22 ENCOUNTER — Other Ambulatory Visit: Payer: Self-pay

## 2020-12-22 ENCOUNTER — Encounter: Payer: Self-pay | Admitting: Pulmonary Disease

## 2020-12-22 ENCOUNTER — Ambulatory Visit (INDEPENDENT_AMBULATORY_CARE_PROVIDER_SITE_OTHER): Payer: Medicare Other | Admitting: Pulmonary Disease

## 2020-12-22 VITALS — BP 112/72 | HR 83 | Temp 97.3°F | Ht 67.0 in | Wt 134.4 lb

## 2020-12-22 DIAGNOSIS — Z0000 Encounter for general adult medical examination without abnormal findings: Secondary | ICD-10-CM

## 2020-12-22 DIAGNOSIS — J449 Chronic obstructive pulmonary disease, unspecified: Secondary | ICD-10-CM

## 2020-12-22 DIAGNOSIS — J9611 Chronic respiratory failure with hypoxia: Secondary | ICD-10-CM | POA: Diagnosis not present

## 2020-12-22 DIAGNOSIS — R059 Cough, unspecified: Secondary | ICD-10-CM | POA: Diagnosis not present

## 2020-12-22 DIAGNOSIS — A498 Other bacterial infections of unspecified site: Secondary | ICD-10-CM | POA: Insufficient documentation

## 2020-12-22 NOTE — Assessment & Plan Note (Signed)
Patient has had 3 rounds of doxycycline this year as well as Omnicef Recent CT imaging August/2021 showing right upper and right lower lobe multifocal pneumonia Patient has clinically improved since this treatment but is not yet back to baseline Patient had sputum cultures that were drawn that grew Pseudomonas-pansensitive Patient feels significantly better after being treated with Levaquin   Plan: Reviewed Pseudomonas as well as sputum cultures with patient Provide additional sputum cup today for patient to have on hand Instructed patient to contact our office if she develops ongoing fatigue, fevers, discolored mucus 

## 2020-12-22 NOTE — Assessment & Plan Note (Signed)
Up-to-date with vaccinations °

## 2020-12-22 NOTE — Patient Instructions (Addendum)
You were seen today by Coral Ceo, NP  for:   1. COPD GOLD III  Trelegy Ellipta  >>> 1 puff daily in the morning >>>rinse mouth out after use  >>> This inhaler contains 3 medications that help manage her respiratory status, contact our office if you cannot afford this medication or unable to remain on this medication  Note your daily symptoms > remember "red flags" for COPD:   >>>Increase in cough >>>increase in sputum production >>>increase in shortness of breath or activity  intolerance.   If you notice these symptoms, please call the office to be seen.    2. Chronic respiratory failure with hypoxia (HCC)  Continue oxygen therapy as prescribed  >>>maintain oxygen saturations greater than 88 percent  >>>if unable to maintain oxygen saturations please contact the office  >>>do not smoke with oxygen  >>>can use nasal saline gel or nasal saline rinses to moisturize nose if oxygen causes dryness  3. Cough  - Respiratory or Resp and Sputum Culture; Future  4. Pseudomonas aeruginosa infection  - Respiratory or Resp and Sputum Culture; Future  Sputum cup provided today.  If you notice that you have increased congestion, sputum production, discolored sputum or fatigue or fever please provide an additional sputum sample and bring to our office  5. Healthcare maintenance  Great job being up-to-date with your vaccinations   We recommend today:  Orders Placed This Encounter  Procedures   Respiratory or Resp and Sputum Culture    Standing Status:   Future    Standing Expiration Date:   12/22/2021   Orders Placed This Encounter  Procedures   Respiratory or Resp and Sputum Culture   No orders of the defined types were placed in this encounter.   Follow Up:    Return in about 3 months (around 03/22/2021), or if symptoms worsen or fail to improve, for Follow up with Dr. Sherene Sires.   Notification of test results are managed in the following manner: If there are  any  recommendations or changes to the  plan of care discussed in office today,  we will contact you and let you know what they are. If you do not hear from [place], then your results are normal and you can view them through your  MyChart account , or a letter will be sent to you. Thank you again for trusting [place] with your care  - Thank you, [facility]    It is flu season:   >>> Best ways to protect herself from the flu: Receive the yearly flu vaccine, practice good hand hygiene washing with soap and also using hand sanitizer when available, eat a nutritious meals, get adequate rest, hydrate appropriately       Please contact the office if your symptoms worsen or you have concerns that you are not improving.   Thank you for choosing [facility] for your healthcare, and for allowing [place] to partner with you on your healthcare journey. I am thankful to be able to provide care to you today.   Elisha Headland FNP-C

## 2020-12-22 NOTE — Assessment & Plan Note (Signed)
Patient has had 3 rounds of doxycycline this year as well as Omnicef Recent CT imaging August/2021 showing right upper and right lower lobe multifocal pneumonia Patient has clinically improved since this treatment but is not yet back to baseline Patient had sputum cultures that were drawn that grew Pseudomonas-pansensitive Patient feels significantly better after being treated with Levaquin   Plan: Reviewed Pseudomonas as well as sputum cultures with patient Provide additional sputum cup today for patient to have on hand Instructed patient to contact our office if she develops ongoing fatigue, fevers, discolored mucus

## 2020-12-22 NOTE — Assessment & Plan Note (Signed)
Plan: Continue work with PT OT at home Continue work with home health Continue Trelegy Ellipta Continue oxygen therapy

## 2020-12-22 NOTE — Assessment & Plan Note (Signed)
Plan: Continue oxygen therapy Maintain oxygen saturations above 88% Continue 4 L via POC with exertion  

## 2020-12-22 NOTE — Progress Notes (Signed)
@Patient  ID: [name], female    DOB: 04/10/1932, 86 y.o.   MRN: [id number]  Chief Complaint  Patient presents with  . Follow-up    COPD, doing well    Referring provider: Denita Lung, MD  HPI:  86 year old female former smoker followed in our office for COPD and chronic respiratory failure  PMH: Recurrent pneumonia, allergic rhinitis Smoker/ Smoking History: Former smoker Maintenance: Trelegy Ellipta Pt of: Dr. Melvyn Novas  12/22/2020  - Visit   86 year old female former smoker followed in our office for COPD. Patient presenting today as a follow-up visit. Since last being seen patient has produced a sputum sample that grew Pseudomonas. Patient was treated with Levaquin. Patient reports that her cough as well as her overall respiratory symptoms have improved significantly since being treated with Levaquin. She remains adherent to Trelegy Ellipta. Family and patient both believe that patient is back to baseline.  Questionaires / Pulmonary Flowsheets:   ACT:  No flowsheet data found.  MMRC: No flowsheet data found.  Epworth:  No flowsheet data found.  Tests:   FENO:  No results found for: NITRICOXIDE  PFT: PFT Results Latest Ref Rng & Units 08/31/2015 06/08/2015 03/10/2015 12/13/2014 09/21/2014 08/24/2014 08/09/2014  FVC-Pre L 1.92 1.82 2.10 1.89 1.86 1.75 1.98  FVC-Predicted Pre % 65 61 70 63 62 58 66  FVC-Post L 1.86 1.88 2.11 1.90 1.89 1.92 2.18  FVC-Predicted Post % 63 63 71 63 63 64 72  Pre FEV1/FVC % % 42 43 46 43 45 41 44  Post FEV1/FCV % % 42 44 47 45 47 39 46  FEV1-Pre L 0.81 0.78 0.97 0.81 0.83 0.72 0.86  FEV1-Predicted Pre % 36 35 43 36 37 32 38  FEV1-Post L 0.79 0.82 0.99 0.85 0.90 0.75 1.00    WALK:  SIX MIN WALK 11/24/2020 05/14/2019 01/05/2013 01/05/2013  Supplimental Oxygen during Test? (L/min) - Yes Yes No  O2 Flow Rate - 2 2 -  Type - Continuous Continuous -  Tech Comments: Dropped to 84% with half lap and pt had to rest. After 4 minute rest,  sats to 90% and patient did not want to continue walk. Back to room via WC. Pt back to baseline after another 2 minutes with no complaints voiced. Aaron Edelman aware. started walking on Room air, patient dropped sats mid lap 1 to 84% on room air, her heart rate got up to 140 at this time. patient was put 2L o@ on and sat in a chair. then patient was able to finish lap and maintain o2 sats. no complaints of dizziness just shortness of breath  - -    Imaging: DG Chest 2 View  Result Date: 11/24/2020 CLINICAL DATA:  Cough EXAM: CHEST - 2 VIEW COMPARISON:  10/13/2020, CT from 08/20/2020 FINDINGS: Cardiac shadow is stable. Lungs are hyperinflated bilaterally. Diffuse fibrotic changes are noted similar to that seen on prior exam. The somewhat linear nodular density in the right mid lung is again identified consistent with sequelae from prior infection. No bony abnormality is seen. IMPRESSION: COPD with fibrotic changes. Relatively stable density in the right mid lung consistent with sequelae from prior right upper lobe infiltrate. Electronically Signed   By: Inez Catalina M.D.   On: 11/24/2020 20:44    Lab Results:  CBC    Component Value Date/Time   WBC 8.7 11/24/2020 1210   RBC 3.87 11/24/2020 1210   HGB 11.5 (L) 11/24/2020 1210   HGB 11.4 09/01/2020 1200   HCT  35.0 (L) 11/24/2020 1210   HCT 35.8 09/01/2020 1200   PLT 446.0 (H) 11/24/2020 1210   PLT 314 09/01/2020 1200   MCV 90.4 11/24/2020 1210   MCV 88 09/01/2020 1200   MCH 27.9 09/01/2020 1200   MCH 28.3 08/23/2020 0500   MCHC 33.0 11/24/2020 1210   RDW 15.0 11/24/2020 1210   RDW 14.9 09/01/2020 1200   LYMPHSABS 1.2 11/24/2020 1210   LYMPHSABS 1.0 09/01/2020 1200   MONOABS 0.5 11/24/2020 1210   EOSABS 0.2 11/24/2020 1210   EOSABS 0.0 09/01/2020 1200   BASOSABS 0.1 11/24/2020 1210   BASOSABS 0.1 09/01/2020 1200    BMET    Component Value Date/Time   NA 138 11/24/2020 1210   NA 139 09/05/2020 1130   K 4.3 11/24/2020 1210   CL 99  11/24/2020 1210   CO2 31 11/24/2020 1210   GLUCOSE 104 (H) 11/24/2020 1210   BUN 11 11/24/2020 1210   BUN 16 09/05/2020 1130   CREATININE 0.69 11/24/2020 1210   CREATININE 0.81 06/27/2016 1445   CALCIUM 10.2 11/24/2020 1210   GFRNONAA 79 09/05/2020 1130   GFRAA 91 09/05/2020 1130    BNP    Component Value Date/Time   BNP 33.1 05/27/2012 1407    ProBNP    Component Value Date/Time   PROBNP 148 11/24/2020 1210   PROBNP 168.8 05/05/2013 1550    Specialty Problems      Pulmonary Problems   COPD GOLD III    Followed in Pulmonary clinic/ [facility]/ [facility]  Quit smoking 02/2013   - PFTs 03/24/2012  FEV1  0.91 (43%) ratio 48 and no better p B2,  DLCO 38% corrects to 53%  - changed to perforomist/bud bid 04/2013  -2015 pulmonary rehab  - med calendar 07/17/13 > did not bring 06/28/2014 , .redone 07/26/2014  - 06/28/2014 p extensive coaching HFA effectiveness =    75% (short Ti) - 08/24/14 started IMPACT trial >finished 08/2015  - PFTs 12/13/14  FEV1 0.85 (38%) ratio 45  p 5 % improvement from saba  09/20/2015 Med calendar  - 05/08/2016 changed lama to incruse  - 11/12/2017  After extensive coaching HFA effectiveness =   75% (short Ti) - 11/12/17 > changed to trelegy          Chronic respiratory failure with hypercapnia ([place])    Followed in Pulmonary clinic/ [facility]/ [facility]   Onset 02 rx 2013 initially just at hs   - ono RA 05/14/12   4 h 33 min < 89%   So ordered 02 2lpm    - HCO3  32 05/10/13    - 01/05/2013   Walked RA x one lap @ 185 stopped due to  88%   - 01/05/2013  Walked 2lpm 2 laps @ 185 ft each stopped due to  90% sob but improved vs baseline   - RA 05/01/2013 sats = 87%  - 05/10/2017 > 02 sats 88% RA > referred for POC eval > preferred continuous flow - 05/14/2019   Walked RA  1 laps @  approx 125ft each @ slow pace  stopped due to sob and desats to 84% corrected on 2lpm and completed lap s desats  rec as of 02/11/2020  2lpm 24/7         Acute on chronic  respiratory failure with hypoxia (HCC)   COPD exacerbation ([place])    Followed in Pulmonary clinic/ [facility]/ [facility]  - See admit 05/05/2013  - mild flare onset 05/04/16 >  rx zpak only  - mild flare  11/05/16 rx zpak/ pred       Chronic respiratory failure with hypoxia (HCC)   Sepsis due to pneumonia (HCC)   Cough      Allergies  Allergen Reactions  . Augmentin [Amoxicillin-Pot Clavulanate] Nausea And Vomiting  . Levofloxacin Other (See Comments)    Lowered blood pressre    Immunization History  Administered Date(s) Administered  . DTaP 12/06/1992, 04/27/2004  . Fluad Quad(high Dose 65+) 09/01/2020  . Influenza Split 10/07/2012, 09/11/2013  . Influenza Whole 10/08/2001, 10/02/2002, 09/19/2011, 09/23/2016  . Influenza, High Dose Seasonal PF 09/22/2014, 09/30/2017, 09/17/2018, 09/17/2019  . Influenza,inj,Quad PF,6+ Mos 09/20/2015  . PFIZER SARS-COV-2 Vaccination 01/02/2020, 02/02/2020, 09/01/2020  . Pneumococcal Conjugate-13 05/27/2014  . Pneumococcal Polysaccharide-23 04/07/2013  . Tdap 06/05/2019  . Zoster 12/03/2008    Past Medical History:  Diagnosis Date  . Asthma   . COPD (chronic obstructive pulmonary disease) (HCC) 01/2010   Dr. Sherene Sires;  . [facility])   . Former smoker    quit 02/2013  . H/O bone density study 2008   osteoporosis, improved density on Fosamax at that time  . Hypoxia 01/2013   hospitalization, Bipap therapy, COPD exacerbation  . Osteoporosis     Tobacco History: Social History   Tobacco Use  Smoking Status Former Smoker  . Packs/day: 0.30  . Years: 50.00  . Pack years: 15.00  . Types: Cigarettes  . Quit date: 03/05/2013  . Years since quitting: 7.8  Smokeless Tobacco Never Used   Counseling given: Not Answered   Continue to not smoke  Outpatient Encounter Medications as of 12/22/2020  Medication Sig  . acetaminophen (TYLENOL) 650 MG CR tablet Take 650 mg by mouth every 8 (eight) hours as needed for pain.  Marland Kitchen albuterol  (VENTOLIN HFA) 108 (90 Base) MCG/ACT inhaler Inhale 2 puffs into the lungs every 4 (four) hours as needed for wheezing or shortness of breath (((PLAN A))). Reported on 01/12/2016  . ALPRAZolam (XANAX) 0.25 MG tablet Take 0.5 tablets (0.125 mg total) by mouth daily as needed for up to 10 doses for anxiety.  . brimonidine (ALPHAGAN) 0.2 % ophthalmic solution Place 1 drop into the right eye 3 (three) times daily.   . Cholecalciferol (VITAMIN D) 50 MCG (2000 UT) CAPS Take 1 capsule by mouth daily.  Marland Kitchen dextromethorphan-guaiFENesin (MUCINEX DM) 30-600 MG per 12 hr tablet Take 1 tablet by mouth at bedtime as needed (with flutter for cough, congestion, and thick mucus).   Marland Kitchen doxycycline (VIBRA-TABS) 100 MG tablet Take 1 tablet (100 mg total) by mouth 2 (two) times daily.  Marland Kitchen levofloxacin (LEVAQUIN) 500 MG tablet Take 1 tablet (500 mg total) by mouth daily.  . OXYGEN Use 2L of O2 continuously  . TRELEGY ELLIPTA 100-62.5-25 MCG/INH AEPB INHALE 1 PUFF INTO THE LUNGS EVERY MORNING.  . [DISCONTINUED] predniSONE (DELTASONE) 10 MG tablet 3 tabs daily x3 days,2 tabs daily x3 days then 1 tab daily x3 days then stop   Facility-Administered Encounter Medications as of 12/22/2020  Medication  . influenza  inactive virus vaccine (FLUZONE/FLUARIX) injection 0.5 mL     Review of Systems  Review of Systems  Constitutional: Positive for fatigue. Negative for activity change and fever.  HENT: Negative for sinus pressure, sinus pain and sore throat.   Respiratory: Positive for shortness of breath. Negative for cough and wheezing.   Cardiovascular: Negative for chest pain and palpitations.  Gastrointestinal: Negative for diarrhea, nausea and vomiting.  Musculoskeletal: Negative  for arthralgias.  Neurological: Negative for dizziness.  Psychiatric/Behavioral: Negative for sleep disturbance. The patient is not nervous/anxious.      Physical Exam  BP 112/72 (BP Location: Left Arm, Cuff Size: Normal)   Pulse 83   Temp  (!) 97.3 F (36.3 C) (Oral)   Ht 5\' 7"  (1.702 m)   Wt 134 lb 6.4 oz (61 kg)   SpO2 91%   BMI 21.05 kg/m   Wt Readings from Last 5 Encounters:  12/22/20 134 lb 6.4 oz (61 kg)  12/06/20 135 lb (61.2 kg)  11/24/20 135 lb (61.2 kg)  10/13/20 132 lb 12.8 oz (60.2 kg)  09/01/20 133 lb 9.6 oz (60.6 kg)    BMI Readings from Last 5 Encounters:  12/22/20 21.05 kg/m  12/06/20 21.14 kg/m  11/24/20 21.14 kg/m  10/13/20 20.80 kg/m  09/01/20 20.92 kg/m     Physical Exam Vitals and nursing note reviewed.  Constitutional:      General: She is not in acute distress.    Appearance: Normal appearance. She is normal weight.  HENT:     Head: Normocephalic and atraumatic.     Right Ear: External ear normal.     Left Ear: External ear normal.     Nose: Nose normal. No congestion.     Mouth/Throat:     Mouth: Mucous membranes are moist.     Pharynx: Oropharynx is clear.  Eyes:     Pupils: Pupils are equal, round, and reactive to light.  Cardiovascular:     Rate and Rhythm: Normal rate and regular rhythm.     Pulses: Normal pulses.     Heart sounds: Normal heart sounds. No murmur heard.   Pulmonary:     Effort: No respiratory distress.     Breath sounds: Normal breath sounds. No decreased air movement. No decreased breath sounds, wheezing or rales.  Musculoskeletal:     Cervical back: Normal range of motion.  Skin:    General: Skin is warm and dry.     Capillary Refill: Capillary refill takes less than 2 seconds.  Neurological:     General: No focal deficit present.     Mental Status: She is alert and oriented to person, place, and time. Mental status is at baseline.     Gait: Gait normal.  Psychiatric:        Mood and Affect: Mood normal.        Behavior: Behavior normal.        Thought Content: Thought content normal.        Judgment: Judgment normal.       Assessment & Plan:   Chronic respiratory failure with hypoxia (HCC) Plan: Continue oxygen therapy Maintain  oxygen saturations above 88% Continue 4 L via POC with exertion   COPD GOLD III Plan: Continue work with PT OT at home Continue work with home health Continue Trelegy Ellipta Continue oxygen therapy  Cough Patient has had 3 rounds of doxycycline this year as well as Omnicef Recent CT imaging August/2021 showing right upper and right lower lobe multifocal pneumonia Patient has clinically improved since this treatment but is not yet back to baseline Patient had sputum cultures that were drawn that grew Pseudomonas-pansensitive Patient feels significantly better after being treated with Levaquin   Plan: Reviewed Pseudomonas as well as sputum cultures with patient Provide additional sputum cup today for patient to have on hand Instructed patient to contact our office if she develops ongoing fatigue, fevers, discolored mucus  Pseudomonas aeruginosa  infection Patient has had 3 rounds of doxycycline this year as well as Omnicef Recent CT imaging August/2021 showing right upper and right lower lobe multifocal pneumonia Patient has clinically improved since this treatment but is not yet back to baseline Patient had sputum cultures that were drawn that grew Pseudomonas-pansensitive Patient feels significantly better after being treated with Levaquin   Plan: Reviewed Pseudomonas as well as sputum cultures with patient Provide additional sputum cup today for patient to have on hand Instructed patient to contact our office if she develops ongoing fatigue, fevers, discolored mucus  Healthcare maintenance Up-to-date with vaccinations    Return in about 3 months (around 03/22/2021), or if symptoms worsen or fail to improve, for Follow up with Dr. Sherene Sires.   Coral Ceo, NP 12/22/2020   This appointment required 32 minutes of patient care (this includes precharting, chart review, review of results, face-to-face care, etc.).

## 2020-12-23 DIAGNOSIS — K5790 Diverticulosis of intestine, part unspecified, without perforation or abscess without bleeding: Secondary | ICD-10-CM | POA: Diagnosis not present

## 2020-12-23 DIAGNOSIS — J440 Chronic obstructive pulmonary disease with acute lower respiratory infection: Secondary | ICD-10-CM | POA: Diagnosis not present

## 2020-12-23 DIAGNOSIS — J189 Pneumonia, unspecified organism: Secondary | ICD-10-CM | POA: Diagnosis not present

## 2020-12-23 DIAGNOSIS — I2781 Cor pulmonale (chronic): Secondary | ICD-10-CM | POA: Diagnosis not present

## 2020-12-23 DIAGNOSIS — J9621 Acute and chronic respiratory failure with hypoxia: Secondary | ICD-10-CM | POA: Diagnosis not present

## 2020-12-23 DIAGNOSIS — D649 Anemia, unspecified: Secondary | ICD-10-CM | POA: Diagnosis not present

## 2020-12-24 DIAGNOSIS — K644 Residual hemorrhoidal skin tags: Secondary | ICD-10-CM | POA: Diagnosis not present

## 2020-12-24 DIAGNOSIS — J440 Chronic obstructive pulmonary disease with acute lower respiratory infection: Secondary | ICD-10-CM | POA: Diagnosis not present

## 2020-12-24 DIAGNOSIS — F419 Anxiety disorder, unspecified: Secondary | ICD-10-CM | POA: Diagnosis not present

## 2020-12-24 DIAGNOSIS — I2781 Cor pulmonale (chronic): Secondary | ICD-10-CM | POA: Diagnosis not present

## 2020-12-24 DIAGNOSIS — Z87891 Personal history of nicotine dependence: Secondary | ICD-10-CM | POA: Diagnosis not present

## 2020-12-24 DIAGNOSIS — D649 Anemia, unspecified: Secondary | ICD-10-CM | POA: Diagnosis not present

## 2020-12-24 DIAGNOSIS — J9621 Acute and chronic respiratory failure with hypoxia: Secondary | ICD-10-CM | POA: Diagnosis not present

## 2020-12-24 DIAGNOSIS — Z9181 History of falling: Secondary | ICD-10-CM | POA: Diagnosis not present

## 2020-12-24 DIAGNOSIS — Z7951 Long term (current) use of inhaled steroids: Secondary | ICD-10-CM | POA: Diagnosis not present

## 2020-12-24 DIAGNOSIS — Z9981 Dependence on supplemental oxygen: Secondary | ICD-10-CM | POA: Diagnosis not present

## 2020-12-24 DIAGNOSIS — Z9049 Acquired absence of other specified parts of digestive tract: Secondary | ICD-10-CM | POA: Diagnosis not present

## 2020-12-24 DIAGNOSIS — M810 Age-related osteoporosis without current pathological fracture: Secondary | ICD-10-CM | POA: Diagnosis not present

## 2020-12-24 DIAGNOSIS — K5790 Diverticulosis of intestine, part unspecified, without perforation or abscess without bleeding: Secondary | ICD-10-CM | POA: Diagnosis not present

## 2020-12-24 DIAGNOSIS — J189 Pneumonia, unspecified organism: Secondary | ICD-10-CM | POA: Diagnosis not present

## 2020-12-26 ENCOUNTER — Telehealth: Payer: Self-pay

## 2020-12-26 LAB — FUNGUS CULTURE W SMEAR
MICRO NUMBER:: 11268963
SMEAR:: NONE SEEN
SPECIMEN QUALITY:: ADEQUATE

## 2020-12-26 LAB — RESPIRATORY CULTURE OR RESPIRATORY AND SPUTUM CULTURE
MICRO NUMBER:: 11268964
RESULT:: NORMAL
SPECIMEN QUALITY:: ADEQUATE

## 2020-12-26 NOTE — Telephone Encounter (Signed)
[name] for verbal order  Home nursing 1 time week for 9 week pneumonia and copd management.

## 2020-12-26 NOTE — Telephone Encounter (Signed)
Verbal order given  

## 2020-12-26 NOTE — Telephone Encounter (Signed)
ok 

## 2020-12-28 DIAGNOSIS — J189 Pneumonia, unspecified organism: Secondary | ICD-10-CM | POA: Diagnosis not present

## 2020-12-28 DIAGNOSIS — J440 Chronic obstructive pulmonary disease with acute lower respiratory infection: Secondary | ICD-10-CM | POA: Diagnosis not present

## 2020-12-28 DIAGNOSIS — D649 Anemia, unspecified: Secondary | ICD-10-CM | POA: Diagnosis not present

## 2020-12-28 DIAGNOSIS — I2781 Cor pulmonale (chronic): Secondary | ICD-10-CM | POA: Diagnosis not present

## 2020-12-28 DIAGNOSIS — K5790 Diverticulosis of intestine, part unspecified, without perforation or abscess without bleeding: Secondary | ICD-10-CM | POA: Diagnosis not present

## 2020-12-28 DIAGNOSIS — J9621 Acute and chronic respiratory failure with hypoxia: Secondary | ICD-10-CM | POA: Diagnosis not present

## 2020-12-29 ENCOUNTER — Telehealth: Payer: Self-pay | Admitting: Internal Medicine

## 2020-12-29 MED ORDER — PREDNISONE 10 MG PO TABS: ORAL_TABLET | ORAL | 0 refills | Status: AC

## 2020-12-29 NOTE — Telephone Encounter (Signed)
Primary Pulmonologist: Dr. Sherene Sires Last office visit and with whom: Brian,NP, 12/22/20 What do we see them for (pulmonary problems): COPD Last OV assessment/plan:  Follow Up:    Return in about 3 months (around 03/22/2021), or if symptoms worsen or fail to improve, for Follow up with Dr. Sherene Sires.   Notification of test results are managed in the following manner: If there are  any recommendations or changes to the  plan of care discussed in office today,  we will contact you and let you know what they are. If you do not hear from [place], then your results are normal and you can view them through your  MyChart account , or a letter will be sent to you. Thank you again for trusting [place] with your care  - Thank you, [facility]    It is flu season:   >>> Best ways to protect herself from the flu: Receive the yearly flu vaccine, practice good hand hygiene washing with soap and also using hand sanitizer when available, eat a nutritious meals, get adequate rest, hydrate appropriately       Please contact the office if your symptoms worsen or you have concerns that you are not improving.   Thank you for choosing [facility] for your healthcare, and for allowing [place] to partner with you on your healthcare journey. I am thankful to be able to provide care to you today.   Elisha Headland FNP-C   Reason for call:  Patient stated she started having a cough yesterday, 12/28/20.  Patient stated cough is productive at times and phlegm is very thick, clear. Patient stated she has been checking her temperature and it has remained normal.  Patient denies any other symptom.  Patient denies any covid exposures or covid related symptoms.Patient has been vaccinated for Covid and has had her covid booster. Patient stated she has been taking Mucinex and Trelegy as recommended.  Message routed to Brian,NP to advise    Allergies  Allergen Reactions  . Augmentin [Amoxicillin-Pot Clavulanate]  Nausea And Vomiting  . Levofloxacin Other (See Comments)    Lowered blood pressre    Immunization History  Administered Date(s) Administered  . DTaP 12/06/1992, 04/27/2004  . Fluad Quad(high Dose 65+) 09/01/2020  . Influenza Split 10/07/2012, 09/11/2013  . Influenza Whole 10/08/2001, 10/02/2002, 09/19/2011, 09/23/2016  . Influenza, High Dose Seasonal PF 09/22/2014, 09/30/2017, 09/17/2018, 09/17/2019  . Influenza,inj,Quad PF,6+ Mos 09/20/2015  . PFIZER SARS-COV-2 Vaccination 01/02/2020, 02/02/2020, 09/01/2020  . Pneumococcal Conjugate-13 05/27/2014  . Pneumococcal Polysaccharide-23 04/07/2013  . Tdap 06/05/2019  . Zoster 12/03/2008

## 2020-12-29 NOTE — Telephone Encounter (Signed)
Called and spoke with pt letting her know the info sated by both MW and Arlys John and stated to her that we were going to send Rx for pred to pharmacy in case she needed it. Pt verbalized understanding. Rx sent to preferred pharmacy for pt. Nothing further needed.

## 2020-12-29 NOTE — Telephone Encounter (Signed)
Will defer to Dr. Sherene Sires as this is his patient.  Based off information provided I would continue forward with Mucinex as well as resting and monitoring symptoms.  Do not believe antibiotics are needed at this point.  Patient does have known history of Pseudomonas.  With improvement of symptoms when treated with Levaquin 4 to 6 weeks ago.  Elisha Headland, FNP

## 2020-12-29 NOTE — Telephone Encounter (Signed)
If not improving > Prednisone 10 mg take  4 each am x 2 days,   2 each am x 2 days,  1 each am x 2 days and stop  (keep on hand, don't necessarily need it yet)

## 2021-01-02 ENCOUNTER — Telehealth: Payer: Self-pay | Admitting: Internal Medicine

## 2021-01-02 NOTE — Telephone Encounter (Signed)
Spoke with the pt  She is c/o increased cough x 1 wk- clear to light yellow sputum  She states also having increased DOE  She called on 12/29/20 and we called in pred but she has not started yet  I advised needs to start taking this  She wants abs to take along with it  Denies f/c/s, aches, wheezing  She is taking her trelegy daily and using albuterol a couple of times per day  She has had covid vax x 3  Please advise, thanks!

## 2021-01-02 NOTE — Telephone Encounter (Signed)
Agree with stated recommendations.  Please schedule patient for follow-up with Dr. Melvyn Novas.  If patient is afebrile and not having significant thick purulent productive mucus I do not believe she needs additional antibiotics at this time.  Patient was just recently treated with Levaquin.  I agree with proceeding forward with prednisone.  Wyn Quaker, FNP

## 2021-01-02 NOTE — Telephone Encounter (Signed)
Spoke with the pt and notified of response per Aaron Edelman  She verbalized understanding and states nothing further needed  Will call if fever or purulent sputum

## 2021-01-02 NOTE — Telephone Encounter (Signed)
ATC patient-- line rang for <83min then went silent. I ended call and called back x2 and received busy signal.  Will call back.

## 2021-01-04 ENCOUNTER — Telehealth: Payer: Self-pay | Admitting: Internal Medicine

## 2021-01-04 DIAGNOSIS — K5790 Diverticulosis of intestine, part unspecified, without perforation or abscess without bleeding: Secondary | ICD-10-CM | POA: Diagnosis not present

## 2021-01-04 DIAGNOSIS — D649 Anemia, unspecified: Secondary | ICD-10-CM | POA: Diagnosis not present

## 2021-01-04 DIAGNOSIS — J189 Pneumonia, unspecified organism: Secondary | ICD-10-CM | POA: Diagnosis not present

## 2021-01-04 DIAGNOSIS — J9621 Acute and chronic respiratory failure with hypoxia: Secondary | ICD-10-CM | POA: Diagnosis not present

## 2021-01-04 DIAGNOSIS — I2781 Cor pulmonale (chronic): Secondary | ICD-10-CM | POA: Diagnosis not present

## 2021-01-04 DIAGNOSIS — J440 Chronic obstructive pulmonary disease with acute lower respiratory infection: Secondary | ICD-10-CM | POA: Diagnosis not present

## 2021-01-04 MED ORDER — LEVOFLOXACIN 500 MG PO TABS: 500.0000 mg | ORAL_TABLET | Freq: Every day | ORAL | 0 refills | Status: DC

## 2021-01-04 NOTE — Telephone Encounter (Signed)
ATC Amy, [place] RN.  LM for Amy to call office. Called and spoke with Patient. Dr. Gustavus Bryant recommendations given.  Patient requested prescriptions to be sent to [facility]. Prescription sent to pharmacy.  Will leave message open until Amy is aware of recommendations.

## 2021-01-04 NOTE — Telephone Encounter (Signed)
Would just go ahead with levaquin 500 mg daily x 10 days - even if has pna the rx would be the same - as longer as comfortable at rest and sats > 90% would stay out of the er

## 2021-01-04 NOTE — Telephone Encounter (Signed)
Spoke with Amy, home health nurse with [employer]  She states pt started on pred yesterday for increased DOE started about a wk ago and getting worse She dropped to 82% 2lpm after walking across the room this morning---increased back to 93% 2lpm with rest "lungs sound junky" - no fever or purulent sputum Amy asking if MW wants to order any chest imaging to see if she could have PNA  [facility] cx[facility] could go to pt's home if we give verbal order for this  Pt has had both covid vaccines as well as booster Sept 2021 Please advise, thanks!

## 2021-01-05 ENCOUNTER — Other Ambulatory Visit: Payer: Self-pay

## 2021-01-05 ENCOUNTER — Other Ambulatory Visit: Payer: Medicare Other

## 2021-01-05 VITALS — BP 134/80 | HR 82 | Temp 97.6°F | Resp 24

## 2021-01-05 DIAGNOSIS — Z515 Encounter for palliative care: Secondary | ICD-10-CM

## 2021-01-05 NOTE — Telephone Encounter (Signed)
Called and spoke to [name] with [facility]. Informed her of the recs per Dr. Melvyn Novas. She verbalized understanding and also states the pt has been dealing with pna since September and is questioning what else can be done so pt doesn't keep needing abx.   Will send to Dr. Melvyn Novas for any suggested recs.

## 2021-01-05 NOTE — Telephone Encounter (Signed)
lmtcb for Amy 

## 2021-01-05 NOTE — Telephone Encounter (Signed)
Can't work that out over the phone, needs ov with cxr and all meds in hand

## 2021-01-05 NOTE — Progress Notes (Addendum)
PATIENT NAME: [name] DOB: [birth year] MRN: [id number]  PRIMARY CARE PROVIDER: Denita Lung, MD  RESPONSIBLE PARTY:  Acct ID - Guarantor Home Phone Work Phone Relationship Acct Type  [id number] - [place],[id number]  Self P/F     [address], [name], [place] [address]    PLAN OF CARE and INTERVENTIONS:               1.  GOALS OF CARE/ ADVANCE CARE PLANNING:  Patient desires to remain home and avoid hospitalizations.               2.  PATIENT/CAREGIVER EDUCATION:  Started discussion about AD, MOST form and Code status but patient did not wish to address this.               4. PERSONAL EMERGENCY PLAN:  Patient desires to stay out of the hospital if possible.               5.  DISEASE STATUS:  Patient answered the door visibly short of breath.  Ambulates back to the kitchen and obtains pulse ox measurement.  Reading is 90-93% on 2 L via [place].  When I checked on my pulse ox reading registers at 87-89% on 2 L via [place].  Patient denies shortness of breath while resting.  She does have a productive cough in the am and notes clear sputum production.  Patient was started on prednisone last week due to worsening respiratory condition reported by the [facility] nurse.  This week patient has been started on Levaquin.  She is reporting some improvement in her respiratory status but notes insomnia when she is taking this antibiotic.  Patient states she does not wish to go to the hospital and desires to remain home.  We further discussed if her condition worsened and she stated she would still would stay home.  I attempted discussion on goals of care but patient did not wish to address this.  She reports her daughter is her 73.  Dr. Gustavus Bryant office attempted to reach patient prior to my visit but she did not recognize the number and did not answer.  1250 pm.  Phone call made to MD office but unable to reach anyone after 15 minutes.  Patient will attempt to call office this afternoon.  Patient continues  to have assistance in the home.  She has a caregiver that gets groceries and another person who does house cleaning.    Patient is ambulating without assistive devices.  No falls are reported. Patient denies increase weakness or unsteadiness with ambulation.  Appetite remains good and she continues to cook for herself.  She does not report any weight changes but is not weighing herself on a regular basis.  Patient became tearful discussing her family.  She reports the loss of 2 sisters in a 6 month time span just before Christmas.  Active listening and support provided to patient as she talks about her loss.  HISTORY OF PRESENT ILLNESS:  87 year old female with COPD.  Patient is being followed by Palliative Care monthly and PRN.  CODE STATUS: Full ADVANCED DIRECTIVES: No MOST FORM: No PPS: 50%   PHYSICAL EXAM:   VITALS:  Temp 97.6 F, BP 134/80 P 82 R 24 O2 sats ranging from 87-93% on 2 L O2 via [place]. LUNGS: diminished with rhonchi present bilaterally. CARDIAC: HRR EXTREMITIES: No edema. SKIN: skin warm and dry to touch NEURO: Alert and oriented x 3  Lorenza Burton, RN

## 2021-01-05 NOTE — Telephone Encounter (Signed)
lmtcb for pt. Needs OV and CXR.

## 2021-01-06 ENCOUNTER — Telehealth: Payer: Self-pay | Admitting: Internal Medicine

## 2021-01-06 ENCOUNTER — Ambulatory Visit: Payer: Medicare Other

## 2021-01-06 ENCOUNTER — Telehealth: Payer: Self-pay

## 2021-01-06 ENCOUNTER — Ambulatory Visit: Payer: Medicare Other | Admitting: Primary Care

## 2021-01-06 DIAGNOSIS — J441 Chronic obstructive pulmonary disease with (acute) exacerbation: Secondary | ICD-10-CM

## 2021-01-06 DIAGNOSIS — A498 Other bacterial infections of unspecified site: Secondary | ICD-10-CM

## 2021-01-06 MED ORDER — ALBUTEROL SULFATE (2.5 MG/3ML) 0.083% IN NEBU: 2.5000 mg | INHALATION_SOLUTION | Freq: Four times a day (QID) | RESPIRATORY_TRACT | 1 refills | Status: DC | PRN

## 2021-01-06 NOTE — Telephone Encounter (Signed)
Pt had an appt today with EW NP that was cancelled.   Aaron Edelman

## 2021-01-06 NOTE — Telephone Encounter (Signed)
[facility] is calling because pt was scheduled for xray today she but couldnt make it, was wondering if MW wants a xray sooner than 1/25 , also they have verified that pt has nebulizer  but doesnt have any albuterol  to put in it. If MW would like to send in any.[place] near [employer] [phone]

## 2021-01-06 NOTE — Progress Notes (Signed)
@Patient  ID: [name], female    DOB: [birth year], 87 y.o.   MRN: [id number]  No chief complaint on file.   Referring provider: Denita Lung, MD  HPI: 87 year old female, former smoker. PMH COPD GOLD II, chronic respiratory failure with hypoxia, recurrent pneumonia, pseudomonas, allergic rhinitis. Patient of Dr. Melvyn Novas, last seen by pulmonary NP on 12/22/20. She is on 4L oxygen. Maintained on Trelegy Ellipta.   Previous [facility] pulmonary encounter: 11/24/2020  - Visit  87 year old female former smoker found our office for COPD and chronic respiratory failure.  She is established with Dr. Melvyn Novas.  Patient completing follow-up with our office today after contacting our triage line 11/18/2020 reporting acute worsened cough and congestion.  She was afebrile at that time.  She reported stable oxygen levels.  She had previously been discharged from the hospital in early October with pneumonia.  She was treated with Omnicef and doxycycline.  She was treated telephonically on 11/18/2020 with doxycycline.  Patient has a listed Levaquin allergy.  She was unable to communicate if this was a actual allergy or just simply an intolerance.  Patient presenting today with her daughter. They are reporting the patient continues to feel better after being treated with antibiotics and symptoms quickly returned.  Last CT imaging from August/2021 listed below:  No evidence of PE, patchy consolidation of right upper lobe and to lesser extent right lower lobe consistent with multifocal pneumonia.  Patient was hospitalized at this time.  Felt to be septic due to pneumonia.  Was discharged on 08/25/2020.  The discharge summary is listed below:   12/22/2020  - Visit  87 year old female former smoker followed in our office for COPD. Patient presenting today as a follow-up visit. Since last being seen patient has produced a sputum sample that grew Pseudomonas. Patient was treated with Levaquin. Patient reports that her  cough as well as her overall respiratory symptoms have improved significantly since being treated with Levaquin. She remains adherent to Trelegy Ellipta. Family and patient both believe that patient is back to baseline.   01/06/2021- interim hx Patient presents today for acute visit. Patient was seen in early December 2021 by Wyn Quaker NP for cough. Sputum culture grew out pseudomonas, she was prescribed Levaquin 500mg  x 7 days and improved considerably. She had a follow-up on 12/22/20 and was doing well. Patient called our office on 12/29/20 with productive cough that was thick and clear. She was send in prednisone course by Dr. Melvyn Novas. She called back on 12/1020 with reports of cough x 1 week and increased dyspnea. Patient called back again on 01/04/21 with reports of O2 level dropping to 82% on 2L with exertion, improved to 93% with rest. Dr. Melvyn Novas send in a second course of Levaquin 500mg  x 10 days. Patient has been vaccinated for covid and has had her booster    Patient has had 3 rounds of doxycycline this year as well as Omnicef Recent CT imaging August/2021 showing right upper and right lower lobe multifocal pneumonia Patient has clinically improved since this treatment but is not yet back to baseline Patient had sputum cultures that were drawn that grew Pseudomonas-pansensitive Patient feels significantly better after being treated with Levaquin  Allergies  Allergen Reactions  . Augmentin [Amoxicillin-Pot Clavulanate] Nausea And Vomiting  . Levofloxacin Other (See Comments)    Lowered blood pressre    Immunization History  Administered Date(s) Administered  . DTaP 12/06/1992, 04/27/2004  . Fluad Quad(high Dose 65+) 09/01/2020  .  Influenza Split 10/07/2012, 09/11/2013  . Influenza Whole 10/08/2001, 10/02/2002, 09/19/2011, 09/23/2016  . Influenza, High Dose Seasonal PF 09/22/2014, 09/30/2017, 09/17/2018, 09/17/2019  . Influenza,inj,Quad PF,6+ Mos 09/20/2015  . PFIZER SARS-COV-2  Vaccination 01/02/2020, 02/02/2020, 09/01/2020  . Pneumococcal Conjugate-13 05/27/2014  . Pneumococcal Polysaccharide-23 04/07/2013  . Tdap 06/05/2019  . Zoster 12/03/2008    Past Medical History:  Diagnosis Date  . Asthma   . COPD (chronic obstructive pulmonary disease) ([place]) 01/2010   Dr. Melvyn Novas;  . [facility])   . Former smoker    quit 02/2013  . H/O bone density study 2008   osteoporosis, improved density on Fosamax at that time  . Hypoxia 01/2013   hospitalization, Bipap therapy, COPD exacerbation  . Osteoporosis     Tobacco History: Social History   Tobacco Use  Smoking Status Former Smoker  . Packs/day: 0.30  . Years: 50.00  . Pack years: 15.00  . Types: Cigarettes  . Quit date: 03/05/2013  . Years since quitting: 7.8  Smokeless Tobacco Never Used   Counseling given: Not Answered   Outpatient Medications Prior to Visit  Medication Sig Dispense Refill  . acetaminophen (TYLENOL) 650 MG CR tablet Take 650 mg by mouth every 8 (eight) hours as needed for pain.    Marland Kitchen albuterol (VENTOLIN HFA) 108 (90 Base) MCG/ACT inhaler Inhale 2 puffs into the lungs every 4 (four) hours as needed for wheezing or shortness of breath (((PLAN A))). Reported on 01/12/2016 1 each 0  . ALPRAZolam (XANAX) 0.25 MG tablet Take 0.5 tablets (0.125 mg total) by mouth daily as needed for up to 10 doses for anxiety. 5 tablet 0  . brimonidine (ALPHAGAN) 0.2 % ophthalmic solution Place 1 drop into the right eye 3 (three) times daily.     . Cholecalciferol (VITAMIN D) 50 MCG (2000 UT) CAPS Take 1 capsule by mouth daily.    Marland Kitchen dextromethorphan-guaiFENesin (MUCINEX DM) 30-600 MG per 12 hr tablet Take 1 tablet by mouth at bedtime as needed (with flutter for cough, congestion, and thick mucus).     Marland Kitchen doxycycline (VIBRA-TABS) 100 MG tablet Take 1 tablet (100 mg total) by mouth 2 (two) times daily. 14 tablet 0  . levofloxacin (LEVAQUIN) 500 MG tablet Take 1 tablet (500 mg total) by mouth daily. 7 tablet 0   . levofloxacin (LEVAQUIN) 500 MG tablet Take 1 tablet (500 mg total) by mouth daily. 10 tablet 0  . OXYGEN Use 2L of O2 continuously    . TRELEGY ELLIPTA 100-62.5-25 MCG/INH AEPB INHALE 1 PUFF INTO THE LUNGS EVERY MORNING. 60 each 11   Facility-Administered Medications Prior to Visit  Medication Dose Route Frequency Provider Last Rate Last Admin  . influenza  inactive virus vaccine (FLUZONE/FLUARIX) injection 0.5 mL  0.5 mL Intramuscular Once Denita Lung, MD          Review of Systems  Review of Systems   Physical Exam  There were no vitals taken for this visit. Physical Exam   Lab Results:  CBC    Component Value Date/Time   WBC 8.7 11/24/2020 1210   RBC 3.87 11/24/2020 1210   HGB 11.5 (L) 11/24/2020 1210   HGB 11.4 09/01/2020 1200   HCT 35.0 (L) 11/24/2020 1210   HCT 35.8 09/01/2020 1200   PLT 446.0 (H) 11/24/2020 1210   PLT 314 09/01/2020 1200   MCV 90.4 11/24/2020 1210   MCV 88 09/01/2020 1200   MCH 27.9 09/01/2020 1200   MCH 28.3 08/23/2020 0500  MCHC 33.0 11/24/2020 1210   RDW 15.0 11/24/2020 1210   RDW 14.9 09/01/2020 1200   LYMPHSABS 1.2 11/24/2020 1210   LYMPHSABS 1.0 09/01/2020 1200   MONOABS 0.5 11/24/2020 1210   EOSABS 0.2 11/24/2020 1210   EOSABS 0.0 09/01/2020 1200   BASOSABS 0.1 11/24/2020 1210   BASOSABS 0.1 09/01/2020 1200    BMET    Component Value Date/Time   NA 138 11/24/2020 1210   NA 139 09/05/2020 1130   K 4.3 11/24/2020 1210   CL 99 11/24/2020 1210   CO2 31 11/24/2020 1210   GLUCOSE 104 (H) 11/24/2020 1210   BUN 11 11/24/2020 1210   BUN 16 09/05/2020 1130   CREATININE 0.69 11/24/2020 1210   CREATININE 0.81 06/27/2016 1445   CALCIUM 10.2 11/24/2020 1210   GFRNONAA 79 09/05/2020 1130   GFRAA 91 09/05/2020 1130    BNP    Component Value Date/Time   BNP 33.1 05/27/2012 1407    ProBNP    Component Value Date/Time   PROBNP 148 11/24/2020 1210   PROBNP 168.8 05/05/2013 1550    Imaging: No results  found.   Assessment & Plan:   No problem-specific Assessment & Plan notes found for this encounter.     Martyn Ehrich, NP 01/06/2021

## 2021-01-06 NOTE — Telephone Encounter (Signed)
Spoke with Amy with [facility] and notified rx for albuterol neb sol was sent  Nothing further needed

## 2021-01-06 NOTE — Telephone Encounter (Signed)
I called and spoke with the pt  She states cancelled her appt with cxr with Beth today b/c she feels so bad she could not make it in the car  She states symptoms that are bothering her now are insomnia and feeling queasy She states that she is not even having much SOB or cough  She coughed a couple of times today and produced some clear sputum  She is asking if we could prescribe her anything to go in her neb to help with her symptoms  I advised that I do not believe that prescribing any nebs will help with her insomnia and nausea  She states that "I will try anything"  She is currently on levaquin since 01/04/21

## 2021-01-06 NOTE — Telephone Encounter (Signed)
Ok to send albuterol nebulizer solution prescription.  Thanks, Wille Glaser

## 2021-01-06 NOTE — Telephone Encounter (Signed)
Spoke with pt's [facility] nurse Amy  She is aware that the pt did not make it to appt today with cxr  She states if we give VO- they can do cxr at home through [facility]  I advised okay since she was already supposed to have one today and not coming back in until 01/17/21  She asked about albuterol for neb  She feels that the pt was being dishonest about the amount of SOB and wheezing she is having, and wants to have [place] call in albuterol nebs in case of emergency over the weekend- given the winter weather we are expecting   Pt already has machine, just no medicine  Please advise thanks

## 2021-01-06 NOTE — Telephone Encounter (Signed)
950 am.  Phone call made to daughter [name] to follow up on my visit with patient yesterday.  No answer but HIPPA compliant message is left requesting a call back.

## 2021-01-06 NOTE — Telephone Encounter (Signed)
Called and spoke to Amy. Informed her of the recs per Dr. Melvyn Novas as I could not get in touch with the pt. Appt scheduled with Wyn Quaker, NP, for 01/17/21 as MW did not have an openings until February and pt is scheduled to finish her Levaquin on 1/22. Amy aware to inform pt to come in earlier to get CXR prior to visit.   Will forward to [facility] and Dr. Melvyn Novas as Juluis Rainier.

## 2021-01-06 NOTE — Telephone Encounter (Signed)
She should finish her course of levaquin and steroids. No need for nebulizer treatment if her breathing is better. If insomnia is not getting better then she can stop her steroid treatment.

## 2021-01-06 NOTE — Telephone Encounter (Signed)
I called and spoke with the pt and notified of results/recs  She verbalized understanding  Pt to keep planned f/u here on 01/18/21

## 2021-01-10 ENCOUNTER — Telehealth: Payer: Self-pay

## 2021-01-10 DIAGNOSIS — D649 Anemia, unspecified: Secondary | ICD-10-CM | POA: Diagnosis not present

## 2021-01-10 DIAGNOSIS — J189 Pneumonia, unspecified organism: Secondary | ICD-10-CM | POA: Diagnosis not present

## 2021-01-10 DIAGNOSIS — J440 Chronic obstructive pulmonary disease with acute lower respiratory infection: Secondary | ICD-10-CM | POA: Diagnosis not present

## 2021-01-10 DIAGNOSIS — I2781 Cor pulmonale (chronic): Secondary | ICD-10-CM | POA: Diagnosis not present

## 2021-01-10 DIAGNOSIS — K5790 Diverticulosis of intestine, part unspecified, without perforation or abscess without bleeding: Secondary | ICD-10-CM | POA: Diagnosis not present

## 2021-01-10 DIAGNOSIS — J9621 Acute and chronic respiratory failure with hypoxia: Secondary | ICD-10-CM | POA: Diagnosis not present

## 2021-01-10 NOTE — Telephone Encounter (Signed)
952 am.  Message left for daughter [name] requesting a call back so update could be provided.  1159 am.  Return call received from [place].  I have provided an update on my visit this week.  Advised of patient's desire to remain home and not return to the hospital.  I asked if any advance planning has been completed and advised that patient did not wish to pursue that conversation on my visit yesterday.  Daughter confirms this has also happened with her as well.  Patient does not wish to have conversations about advance planning despite daughter's attempts.  Daughter will continue to work on this as patient allows and will provide any updates should changes occur with this topic.

## 2021-01-11 ENCOUNTER — Telehealth: Payer: Self-pay | Admitting: Internal Medicine

## 2021-01-11 DIAGNOSIS — J449 Chronic obstructive pulmonary disease, unspecified: Secondary | ICD-10-CM

## 2021-01-11 DIAGNOSIS — R059 Cough, unspecified: Secondary | ICD-10-CM

## 2021-01-11 DIAGNOSIS — A498 Other bacterial infections of unspecified site: Secondary | ICD-10-CM

## 2021-01-11 NOTE — Telephone Encounter (Signed)
She should finish both pred and levaquin and > 72 h p last levaquin if/ when mucus turns darker yellow/brown/ green can do resp culture specimen, prefer first thing in am best sample typically after rinse mouth with water first.

## 2021-01-11 NOTE — Telephone Encounter (Signed)
Called and spoke with Amy, pt's home health RN and stated the info from MW to her. Amy verbalized understanding. Order for sputum sample has been placed and also order for pt to receive new nebulizer has also been placed. Nothing further needed.

## 2021-01-11 NOTE — Telephone Encounter (Signed)
Called and spoke with the home health nurse, Amy regarding an update of patient's condition.  [facility] x-ray [phone]) was scheduled to perform cxr on 01/10/21, however, when I call to check on the status, I was told that they have a high volume of calls and it will be done today and they called the patient last pm to make her aware.  Per the home health nurse, Amy, her daughter is coming into town today to be with her mom.  She has 3 days left of the Levaquin left that Dr. Melvyn Novas prescribed her previously.  She completed the 4 day taper of the prednisone.  The home health nurse left sputum sample cups at the patient's home in case we need samples and would be glad to bring them to the office.  This morning the patient was too tired to go to the kitchen to to a breathing treatment so the nurse did one when she arrived.  Her oxygen saturation on 2L was 89% when she arrived, after the breathing treatment her sats came up to 94%.  During the visit her sats stayed between 90-92%.  The patient's nebulizer machine is old and is now broken, the home health nurse is asking for an order for a new machine (the nurse had a machine and is letting her use the machine until the patient can get another one).    Dr. Melvyn Novas, The home health nurse wants to know:  Does the patient need more Levaquin and prednisone?  Do you want sputum samples?  Please advise.  Thank you.

## 2021-01-12 DIAGNOSIS — R0602 Shortness of breath: Secondary | ICD-10-CM | POA: Diagnosis not present

## 2021-01-12 MED ORDER — LEVOFLOXACIN 500 MG PO TABS: 500.0000 mg | ORAL_TABLET | Freq: Every day | ORAL | 0 refills | Status: DC
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
## 2021-01-12 NOTE — Telephone Encounter (Signed)
Fine for 4 more days of levaquin and do sputum and cxr 3 days after complete rx  To er in meantime if worse as nothing else to offer s seeing pt

## 2021-01-12 NOTE — Telephone Encounter (Signed)
Patient had CXR today before she got the message to wait 3 days after ABX, do you want her to have another one 3 days after finishing ABX?   Dr. Melvyn Novas please advise

## 2021-01-14 LAB — AFB CULTURE WITH SMEAR (NOT AT ARMC)
Acid Fast Culture: NEGATIVE
Acid Fast Smear: NEGATIVE

## 2021-01-16 DIAGNOSIS — J189 Pneumonia, unspecified organism: Secondary | ICD-10-CM | POA: Diagnosis not present

## 2021-01-16 DIAGNOSIS — J440 Chronic obstructive pulmonary disease with acute lower respiratory infection: Secondary | ICD-10-CM | POA: Diagnosis not present

## 2021-01-16 DIAGNOSIS — I2781 Cor pulmonale (chronic): Secondary | ICD-10-CM | POA: Diagnosis not present

## 2021-01-16 DIAGNOSIS — J9621 Acute and chronic respiratory failure with hypoxia: Secondary | ICD-10-CM | POA: Diagnosis not present

## 2021-01-16 DIAGNOSIS — D649 Anemia, unspecified: Secondary | ICD-10-CM | POA: Diagnosis not present

## 2021-01-16 DIAGNOSIS — K5790 Diverticulosis of intestine, part unspecified, without perforation or abscess without bleeding: Secondary | ICD-10-CM | POA: Diagnosis not present

## 2021-01-17 ENCOUNTER — Ambulatory Visit (INDEPENDENT_AMBULATORY_CARE_PROVIDER_SITE_OTHER): Payer: Medicare Other

## 2021-01-17 ENCOUNTER — Other Ambulatory Visit: Payer: Self-pay | Admitting: Pulmonary Disease

## 2021-01-17 ENCOUNTER — Other Ambulatory Visit: Payer: Self-pay

## 2021-01-17 ENCOUNTER — Ambulatory Visit (INDEPENDENT_AMBULATORY_CARE_PROVIDER_SITE_OTHER): Payer: Medicare Other | Admitting: Pulmonary Disease

## 2021-01-17 ENCOUNTER — Encounter: Payer: Self-pay | Admitting: Pulmonary Disease

## 2021-01-17 VITALS — BP 112/62 | HR 80 | Temp 97.2°F | Ht 67.0 in | Wt 133.4 lb

## 2021-01-17 DIAGNOSIS — R059 Cough, unspecified: Secondary | ICD-10-CM

## 2021-01-17 DIAGNOSIS — J449 Chronic obstructive pulmonary disease, unspecified: Secondary | ICD-10-CM

## 2021-01-17 DIAGNOSIS — J9811 Atelectasis: Secondary | ICD-10-CM | POA: Diagnosis not present

## 2021-01-17 DIAGNOSIS — J9611 Chronic respiratory failure with hypoxia: Secondary | ICD-10-CM

## 2021-01-17 DIAGNOSIS — A498 Other bacterial infections of unspecified site: Secondary | ICD-10-CM | POA: Diagnosis not present

## 2021-01-17 DIAGNOSIS — R0602 Shortness of breath: Secondary | ICD-10-CM | POA: Diagnosis not present

## 2021-01-17 MED ORDER — AZITHROMYCIN 250 MG PO TABS: ORAL_TABLET | ORAL | 0 refills | Status: DC

## 2021-01-17 MED ORDER — SODIUM CHLORIDE 3 % IN NEBU: INHALATION_SOLUTION | RESPIRATORY_TRACT | 12 refills | Status: DC | PRN

## 2021-01-17 NOTE — Assessment & Plan Note (Signed)
Pansensitive Pseudomonas Improved after 2 courses of Levaquin  Plan: We will discuss case with Dr. Melvyn Novas We will reculture with respiratory sputum as well as fungal Continue flutter valve We will provide course of azithromycin in case symptoms flare again before patient can be seen next month

## 2021-01-17 NOTE — Patient Instructions (Addendum)
You were seen today by Lauraine Rinne, NP  for:   1. Cough  - azithromycin (ZITHROMAX) 250 MG tablet; 500mg  (two tablets) today, then 250mg  (1 tablet) for the next 4 days  Dispense: 6 tablet; Refill: 0 - sodium chloride HYPERTONIC 3 % nebulizer solution; Take by nebulization as needed for other.  Dispense: 750 mL; Refill: 12  I suspect you have Bronchiectasis: This is the medical term which indicates that you have damage, dilated airways making you more susceptible to respiratory infection. Use a flutter valve 10 breaths twice a day or 4 to 5 breaths 4-5 times a day to help clear mucus out Let us know if you have cough with change in mucus color or fevers or chills.  At that point you would need an antibiotic. Maintain a healthy nutritious diet, eating whole foods Take your medications as prescribed   We will start you on hypertonic saline nebs for you to use twice daily before flutter valve use  Okay with you continuing to use albuterol nebs twice daily before flutter valve until hypertonic saline nebs are able to be started  We will provide course of azithromycin for you to be able to start using if you start to develop worsened fatigue, fevers, loss of appetite, right-sided lung pain  2. Pseudomonas aeruginosa infection  - Respiratory or Resp and Sputum Culture; Future - sodium chloride HYPERTONIC 3 % nebulizer solution; Take by nebulization as needed for other.  Dispense: 750 mL; Refill: 12  Return sputum samples and 3 days for respiratory sputum as well as fungal  I will discuss your case with Dr. Melvyn Novas  3. COPD mixed type ([place])  - azithromycin (ZITHROMAX) 250 MG tablet; 500mg  (two tablets) today, then 250mg  (1 tablet) for the next 4 days  Dispense: 6 tablet; Refill: 0  Trelegy Ellipta  >>> 1 puff daily in the morning >>>rinse mouth out after use  >>> This inhaler contains 3 medications that help manage her respiratory status, contact our office if you cannot afford this  medication or unable to remain on this medication  Continue Trelegy Ellipta at this time. As discussed today we may consider stopping Trelegy Ellipta and starting Anoro Ellipta as being on an inhaled corticosteroid can increase your risk of having recurrent pneumonias  Note your daily symptoms > remember "red flags" for COPD:   >>>Increase in cough >>>increase in sputum production >>>increase in shortness of breath or activity  intolerance.   If you notice these symptoms, please call the office to be seen.   4. Chronic respiratory failure with hypoxia (HCC)  Continue oxygen therapy as prescribed  >>>maintain oxygen saturations greater than 88 percent  >>>if unable to maintain oxygen saturations please contact the office  >>>do not smoke with oxygen  >>>can use nasal saline gel or nasal saline rinses to moisturize nose if oxygen causes dryness    We recommend today:  Orders Placed This Encounter  Procedures  . Respiratory or Resp and Sputum Culture    Standing Status:   Future    Standing Expiration Date:   01/17/2022  . Culture, fungus without smear    Standing Status:   Future    Standing Expiration Date:   01/17/2022   Orders Placed This Encounter  Procedures  . Respiratory or Resp and Sputum Culture  . Culture, fungus without smear   Meds ordered this encounter  Medications  . azithromycin (ZITHROMAX) 250 MG tablet    Sig: 500mg  (two tablets) today, then 250mg  (1  tablet) for the next 4 days    Dispense:  6 tablet    Refill:  0  . sodium chloride HYPERTONIC 3 % nebulizer solution    Sig: Take by nebulization as needed for other.    Dispense:  750 mL    Refill:  12    Follow Up:    Return in about 20 days (around 02/06/2021), or if symptoms worsen or fail to improve, for Follow up with Dr. Melvyn Novas.   Notification of test results are managed in the following manner: If there are  any recommendations or changes to the  plan of care discussed in office today,  we will  contact you and let you know what they are. If you do not hear from [place], then your results are normal and you can view them through your  MyChart account , or a letter will be sent to you. Thank you again for trusting [place] with your care  - Thank you, [facility]    It is flu season:   >>> Best ways to protect herself from the flu: Receive the yearly flu vaccine, practice good hand hygiene washing with soap and also using hand sanitizer when available, eat a nutritious meals, get adequate rest, hydrate appropriately       Please contact the office if your symptoms worsen or you have concerns that you are not improving.   Thank you for choosing [facility] for your healthcare, and for allowing [place] to partner with you on your healthcare journey. I am thankful to be able to provide care to you today.   Wyn Quaker FNP-C

## 2021-01-17 NOTE — Assessment & Plan Note (Signed)
Plan: Continue oxygen therapy Maintain oxygen saturations above 88% Continue 4 L via POC with exertion

## 2021-01-17 NOTE — Progress Notes (Signed)
@Patient  ID: [name], female    DOB: 04-09-1932, 87 y.o.   MRN: [id number]  Chief Complaint  Patient presents with  . Follow-up    Doing better, some SOB on exertion, not feeling as fatigued    Referring provider: Denita Lung, MD  HPI:  87 year old female former smoker followed in our office for COPD and chronic respiratory failure  PMH: Recurrent pneumonia, allergic rhinitis Smoker/ Smoking History: Former smoker Maintenance: Trelegy Ellipta Pt of: Dr. Melvyn Novas  01/17/2021  - Visit   87 year old female former smoker upon our office for COPD and chronic respiratory failure.  She is established with Dr. Melvyn Novas.  Completing follow-up with our office today patient treated with Levaquin by Dr. Melvyn Novas and encouraged to follow-up with our office with a chest x-ray as well as to bring sputum sample.  Patient with previous Pseudomonas infection.  Patient reporting today that she is feeling much better since completing Levaquin.  Chest x-ray today shows likely right middle lobe scarring unable to completely rule out right middle lobe infiltrate.  Patient's daughter reporting that portable chest x-ray read by radiology last week diagnosed patient with a right middle lobe pneumonia.  Patient's daughter is concerned this patient was quite symptomatic last week prior to starting Levaquin.  We will discuss this today.  Patient remains adherent to Trelegy Ellipta.  Patient's daughter and patient are both concerned about cyclical exacerbations requiring antibiotics for management.  Questionaires / Pulmonary Flowsheets:   ACT:  No flowsheet data found.  MMRC: No flowsheet data found.  Epworth:  No flowsheet data found.  Tests:   FENO:  No results found for: NITRICOXIDE  PFT: PFT Results Latest Ref Rng & Units 08/31/2015 06/08/2015 03/10/2015 12/13/2014 09/21/2014 08/24/2014 08/09/2014  FVC-Pre L 1.92 1.82 2.10 1.89 1.86 1.75 1.98  FVC-Predicted Pre % 65 61 70 63 62 58 66  FVC-Post L  1.86 1.88 2.11 1.90 1.89 1.92 2.18  FVC-Predicted Post % 63 63 71 63 63 64 72  Pre FEV1/FVC % % 42 43 46 43 45 41 44  Post FEV1/FCV % % 42 44 47 45 47 39 46  FEV1-Pre L 0.81 0.78 0.97 0.81 0.83 0.72 0.86  FEV1-Predicted Pre % 36 35 43 36 37 32 38  FEV1-Post L 0.79 0.82 0.99 0.85 0.90 0.75 1.00    WALK:  SIX MIN WALK 11/24/2020 05/14/2019 01/05/2013 01/05/2013  Supplimental Oxygen during Test? (L/min) - Yes Yes No  O2 Flow Rate - 2 2 -  Type - Continuous Continuous -  Tech Comments: Dropped to 84% with half lap and pt had to rest. After 4 minute rest, sats to 90% and patient did not want to continue walk. Back to room via WC. Pt back to baseline after another 2 minutes with no complaints voiced. Aaron Edelman aware. started walking on Room air, patient dropped sats mid lap 1 to 84% on room air, her heart rate got up to 140 at this time. patient was put 2L o@ on and sat in a chair. then patient was able to finish lap and maintain o2 sats. no complaints of dizziness just shortness of breath  - -    Imaging: DG Chest 2 View  Result Date: 01/17/2021 CLINICAL DATA:  Shortness of breath EXAM: CHEST - 2 VIEW COMPARISON:  November 24, 2020 and September 26, 2020 FINDINGS: There is apparent scarring in the right mid lung region, similar to most recent prior studies. There is mild left base atelectasis. The lungs elsewhere are  clear. The heart size and pulmonary vascularity are normal. No adenopathy. There is aortic atherosclerosis. There is lower thoracic dextroscoliosis. IMPRESSION: Scarring right mid lung. Appearance similar to most recent prior studies. Subtle infiltrate superimposed in this area is difficult to exclude with certainty. Mild left base atelectasis. Heart size normal.  No adenopathy. Aortic Atherosclerosis (ICD10-I70.0). Electronically Signed   By: Lowella Grip III M.D.   On: 01/17/2021 12:09    Lab Results:  CBC    Component Value Date/Time   WBC 8.7 11/24/2020 1210   RBC 3.87 11/24/2020  1210   HGB 11.5 (L) 11/24/2020 1210   HGB 11.4 09/01/2020 1200   HCT 35.0 (L) 11/24/2020 1210   HCT 35.8 09/01/2020 1200   PLT 446.0 (H) 11/24/2020 1210   PLT 314 09/01/2020 1200   MCV 90.4 11/24/2020 1210   MCV 88 09/01/2020 1200   MCH 27.9 09/01/2020 1200   MCH 28.3 08/23/2020 0500   MCHC 33.0 11/24/2020 1210   RDW 15.0 11/24/2020 1210   RDW 14.9 09/01/2020 1200   LYMPHSABS 1.2 11/24/2020 1210   LYMPHSABS 1.0 09/01/2020 1200   MONOABS 0.5 11/24/2020 1210   EOSABS 0.2 11/24/2020 1210   EOSABS 0.0 09/01/2020 1200   BASOSABS 0.1 11/24/2020 1210   BASOSABS 0.1 09/01/2020 1200    BMET    Component Value Date/Time   NA 138 11/24/2020 1210   NA 139 09/05/2020 1130   K 4.3 11/24/2020 1210   CL 99 11/24/2020 1210   CO2 31 11/24/2020 1210   GLUCOSE 104 (H) 11/24/2020 1210   BUN 11 11/24/2020 1210   BUN 16 09/05/2020 1130   CREATININE 0.69 11/24/2020 1210   CREATININE 0.81 06/27/2016 1445   CALCIUM 10.2 11/24/2020 1210   GFRNONAA 79 09/05/2020 1130   GFRAA 91 09/05/2020 1130    BNP    Component Value Date/Time   BNP 33.1 05/27/2012 1407    ProBNP    Component Value Date/Time   PROBNP 148 11/24/2020 1210   PROBNP 168.8 05/05/2013 1550    Specialty Problems      Pulmonary Problems   COPD GOLD III    Followed in Pulmonary clinic/ [facility]/ [facility]  Quit smoking 02/2013   - PFTs 03/24/2012  FEV1  0.91 (43%) ratio 48 and no better p B2,  DLCO 38% corrects to 53%  - changed to perforomist/bud bid 04/2013  -2015 pulmonary rehab  - med calendar 07/17/13 > did not bring 06/28/2014 , .redone 07/26/2014  - 06/28/2014 p extensive coaching HFA effectiveness =    75% (short Ti) - 08/24/14 started IMPACT trial >finished 08/2015  - PFTs 12/13/14  FEV1 0.85 (38%) ratio 45  p 5 % improvement from saba  09/20/2015 Med calendar  - 05/08/2016 changed lama to incruse  - 11/12/2017  After extensive coaching HFA effectiveness =   75% (short Ti) - 11/12/17 > changed to trelegy           Chronic respiratory failure with hypercapnia ([place])    Followed in Pulmonary clinic/ [facility]/ [facility]   Onset 02 rx 2013 initially just at hs   - ono RA 05/14/12   4 h 33 min < 89%   So ordered 02 2lpm    - HCO3  32 05/10/13    - 01/05/2013   Walked RA x one lap @ 185 stopped due to  88%   - 01/05/2013  Walked 2lpm 2 laps @ 185 ft each stopped due to  90% sob but  improved vs baseline   - RA 05/01/2013 sats = 87%  - 05/10/2017 > 02 sats 88% RA > referred for POC eval > preferred continuous flow - 05/14/2019   Walked RA  1 laps @  approx 139ft each @ slow pace  stopped due to sob and desats to 84% corrected on 2lpm and completed lap s desats  rec as of 02/11/2020  2lpm 24/7         Acute on chronic respiratory failure with hypoxia (HCC)   COPD exacerbation ([place])    Followed in Pulmonary clinic/ [facility]/ [facility]  - See admit 05/05/2013  - mild flare onset 05/04/16 > rx zpak only  - mild flare  11/05/16 rx zpak/ pred       Chronic respiratory failure with hypoxia ([place])   Sepsis due to pneumonia (HCC)   Cough      Allergies  Allergen Reactions  . Augmentin [Amoxicillin-Pot Clavulanate] Nausea And Vomiting    Immunization History  Administered Date(s) Administered  . DTaP 12/06/1992, 04/27/2004  . Fluad Quad(high Dose 65+) 09/01/2020  . Influenza Split 10/07/2012, 09/11/2013  . Influenza Whole 10/08/2001, 10/02/2002, 09/19/2011, 09/23/2016  . Influenza, High Dose Seasonal PF 09/22/2014, 09/30/2017, 09/17/2018, 09/17/2019  . Influenza,inj,Quad PF,6+ Mos 09/20/2015  . PFIZER(Purple Top)SARS-COV-2 Vaccination 01/02/2020, 02/02/2020, 09/01/2020  . Pneumococcal Conjugate-13 05/27/2014  . Pneumococcal Polysaccharide-23 04/07/2013  . Tdap 06/05/2019  . Zoster 12/03/2008    Past Medical History:  Diagnosis Date  . Asthma   . COPD (chronic obstructive pulmonary disease) ([place]) 01/2010   Dr. Melvyn Novas;  . Cor pulmonale [facility])   . Former smoker    quit 02/2013  . H/O bone  density study 2008   osteoporosis, improved density on Fosamax at that time  . Hypoxia 01/2013   hospitalization, Bipap therapy, COPD exacerbation  . Osteoporosis     Tobacco History: Social History   Tobacco Use  Smoking Status Former Smoker  . Packs/day: 0.30  . Years: 50.00  . Pack years: 15.00  . Types: Cigarettes  . Quit date: 03/05/2013  . Years since quitting: 7.8  Smokeless Tobacco Never Used   Counseling given: Not Answered   Continue to not smoke  Outpatient Encounter Medications as of 01/17/2021  Medication Sig  . acetaminophen (TYLENOL) 650 MG CR tablet Take 650 mg by mouth every 8 (eight) hours as needed for pain.  Marland Kitchen albuterol (PROVENTIL) (2.5 MG/3ML) 0.083% nebulizer solution Take 3 mLs (2.5 mg total) by nebulization every 6 (six) hours as needed for wheezing or shortness of breath.  Marland Kitchen albuterol (VENTOLIN HFA) 108 (90 Base) MCG/ACT inhaler Inhale 2 puffs into the lungs every 4 (four) hours as needed for wheezing or shortness of breath (((PLAN A))). Reported on 01/12/2016  . ALPRAZolam (XANAX) 0.25 MG tablet Take 0.5 tablets (0.125 mg total) by mouth daily as needed for up to 10 doses for anxiety.  Marland Kitchen azithromycin (ZITHROMAX) 250 MG tablet 500mg  (two tablets) today, then 250mg  (1 tablet) for the next 4 days  . brimonidine (ALPHAGAN) 0.2 % ophthalmic solution Place 1 drop into the right eye 3 (three) times daily.   . Cholecalciferol (VITAMIN D) 50 MCG (2000 UT) CAPS Take 1 capsule by mouth daily.  Marland Kitchen dextromethorphan-guaiFENesin (MUCINEX DM) 30-600 MG per 12 hr tablet Take 1 tablet by mouth at bedtime as needed (with flutter for cough, congestion, and thick mucus).   . OXYGEN Use 2L of O2 continuously  . sodium chloride HYPERTONIC 3 % nebulizer solution Take by nebulization  as needed for other.  . TRELEGY ELLIPTA 100-62.5-25 MCG/INH AEPB INHALE 1 PUFF INTO THE LUNGS EVERY MORNING.  . [DISCONTINUED] doxycycline (VIBRA-TABS) 100 MG tablet Take 1 tablet (100 mg total) by  mouth 2 (two) times daily.  . [DISCONTINUED] levofloxacin (LEVAQUIN) 500 MG tablet Take 1 tablet (500 mg total) by mouth daily.  . [DISCONTINUED] levofloxacin (LEVAQUIN) 500 MG tablet Take 1 tablet (500 mg total) by mouth daily.  . [DISCONTINUED] levofloxacin (LEVAQUIN) 500 MG tablet Take 1 tablet (500 mg total) by mouth daily.   Facility-Administered Encounter Medications as of 01/17/2021  Medication  . influenza  inactive virus vaccine (FLUZONE/FLUARIX) injection 0.5 mL     Review of Systems  Review of Systems  Constitutional: Positive for fatigue. Negative for activity change and fever.  HENT: Positive for congestion. Negative for sinus pressure, sinus pain and sore throat.   Respiratory: Positive for cough and shortness of breath. Negative for wheezing.   Cardiovascular: Negative for chest pain and palpitations.  Gastrointestinal: Negative for diarrhea, nausea and vomiting.  Musculoskeletal: Negative for arthralgias.  Neurological: Negative for dizziness.  Psychiatric/Behavioral: Negative for sleep disturbance. The patient is not nervous/anxious.      Physical Exam  BP 112/62 (BP Location: Right Arm, Cuff Size: Normal)   Pulse 80   Temp (!) 97.2 F (36.2 C) (Oral)   Ht 5\' 7"  (1.702 m)   Wt 133 lb 6.4 oz (60.5 kg)   SpO2 96%   BMI 20.89 kg/m   Wt Readings from Last 5 Encounters:  01/17/21 133 lb 6.4 oz (60.5 kg)  12/22/20 134 lb 6.4 oz (61 kg)  12/06/20 135 lb (61.2 kg)  11/24/20 135 lb (61.2 kg)  10/13/20 132 lb 12.8 oz (60.2 kg)    BMI Readings from Last 5 Encounters:  01/17/21 20.89 kg/m  12/22/20 21.05 kg/m  12/06/20 21.14 kg/m  11/24/20 21.14 kg/m  10/13/20 20.80 kg/m     Physical Exam Vitals and nursing note reviewed.  Constitutional:      General: She is not in acute distress.    Appearance: Normal appearance. She is normal weight.  HENT:     Head: Normocephalic and atraumatic.     Right Ear: Tympanic membrane, ear canal and external ear  normal. There is no impacted cerumen.     Left Ear: Tympanic membrane, ear canal and external ear normal. There is no impacted cerumen.     Nose: Nose normal. No congestion.     Mouth/Throat:     Mouth: Mucous membranes are moist.     Pharynx: Oropharynx is clear.  Eyes:     Pupils: Pupils are equal, round, and reactive to light.  Cardiovascular:     Rate and Rhythm: Normal rate and regular rhythm.     Pulses: Normal pulses.     Heart sounds: Normal heart sounds. No murmur heard.   Pulmonary:     Effort: Pulmonary effort is normal. No respiratory distress.     Breath sounds: No decreased air movement. No decreased breath sounds, wheezing or rales.       Comments: Diminished breath sounds in bases  Musculoskeletal:     Cervical back: Normal range of motion.  Skin:    General: Skin is warm and dry.     Capillary Refill: Capillary refill takes less than 2 seconds.  Neurological:     General: No focal deficit present.     Mental Status: She is alert and oriented to person, place, and time. Mental status is  at baseline.     Gait: Gait normal.  Psychiatric:        Mood and Affect: Mood normal.        Behavior: Behavior normal.        Thought Content: Thought content normal.        Judgment: Judgment normal.       Assessment & Plan:   Chronic respiratory failure with hypoxia (HCC) Plan: Continue oxygen therapy Maintain oxygen saturations above 88% Continue 4 L via POC with exertion   COPD GOLD III Plan: Continue Trelegy Ellipta Continue oxygen therapy We will discuss with Dr. Melvyn Novas to see if we should stop your ICS given recurrent infections May need to consider high-resolution CT chest imaging in the future   Cough Previous culture Pseudomonas  Plan: Chest x-ray today stable Doing better status post Levaquin We will check respiratory sputum, fungal We will provide azithromycin in case patient symptoms flare again before patient can be seen next month May need  to consider high-resolution CT chest imaging in the future Start hypertonic saline nebs twice daily Continue flutter valve  Pseudomonas aeruginosa infection Pansensitive Pseudomonas Improved after 2 courses of Levaquin  Plan: We will discuss case with Dr. Melvyn Novas We will reculture with respiratory sputum as well as fungal Continue flutter valve We will provide course of azithromycin in case symptoms flare again before patient can be seen next month     Return in about 20 days (around 02/06/2021), or if symptoms worsen or fail to improve, for Follow up with Dr. Melvyn Novas.   Lauraine Rinne, NP 01/17/2021   This appointment required 44 minutes of patient care (this includes precharting, chart review, review of results, face-to-face care, etc.).

## 2021-01-17 NOTE — Assessment & Plan Note (Signed)
Plan: Continue Trelegy Ellipta Continue oxygen therapy We will discuss with Dr. Melvyn Novas to see if we should stop your ICS given recurrent infections May need to consider high-resolution CT chest imaging in the future

## 2021-01-17 NOTE — Assessment & Plan Note (Addendum)
Previous culture Pseudomonas  Plan: Chest x-ray today stable Doing better status post Levaquin We will check respiratory sputum, fungal We will provide azithromycin in case patient symptoms flare again before patient can be seen next month May need to consider high-resolution CT chest imaging in the future Start hypertonic saline nebs twice daily Continue flutter valve

## 2021-01-18 ENCOUNTER — Other Ambulatory Visit: Payer: Self-pay | Admitting: Pulmonary Disease

## 2021-01-18 ENCOUNTER — Telehealth: Payer: Self-pay | Admitting: Pulmonary Disease

## 2021-01-18 MED ORDER — ANORO ELLIPTA 62.5-25 MCG/INH IN AEPB
1.0000 | INHALATION_SPRAY | Freq: Every day | RESPIRATORY_TRACT | 3 refills | Status: DC
Start: 2021-01-18 — End: 2021-03-21

## 2021-01-18 NOTE — Telephone Encounter (Signed)
01/18/2021  We have heard back from Dr. Melvyn Novas.  He agrees on holding off on doing a high-resolution CT chest at this time as it would not change the active plan of care.  Keep current plan of care of obtaining sputum cultures, starting hypertonic saline nebs, continue flutter valve, keep upcoming appointment with Dr. Melvyn Novas in [place] FNP

## 2021-01-18 NOTE — Telephone Encounter (Signed)
01/18/2021  Maintenance or suppressive antibiotics will be discussed and evaluated in person in the February/2022 office visit with Dr. Melvyn Novas.  He is agreeable right now for Ms. Imel to have a course of azithromycin on hand as previously discussed.  Wyn Quaker, FNP

## 2021-01-18 NOTE — Telephone Encounter (Signed)
01/18/2021  Dr. Melvyn Novas did agree with also's trialing coming off of Trelegy Ellipta and replacing with Anoro Ellipta.  Okay to place order for:  Anoro Ellipta  >>> Take 1 puff daily in the morning right when you wake up >>>Rinse your mouth out after use >>>This is a daily maintenance inhaler, NOT a rescue inhaler >>>Contact our office if you are having difficulties affording or obtaining this medication >>>It is important for you to be able to take this daily and not miss any doses  Stop Trelegy Ellipta.   Wyn Quaker FNP

## 2021-01-18 NOTE — Progress Notes (Signed)
As I explained to them yesterday this is a nationwide issue.  In the meantime we will have to continue to use albuterol nebs twice daily prior to using flutter valve.  Wyn Quaker, FNP

## 2021-01-20 ENCOUNTER — Other Ambulatory Visit: Payer: Medicare Other

## 2021-01-20 DIAGNOSIS — A498 Other bacterial infections of unspecified site: Secondary | ICD-10-CM | POA: Diagnosis not present

## 2021-01-20 DIAGNOSIS — R059 Cough, unspecified: Secondary | ICD-10-CM | POA: Diagnosis not present

## 2021-01-23 DIAGNOSIS — Z9981 Dependence on supplemental oxygen: Secondary | ICD-10-CM | POA: Diagnosis not present

## 2021-01-23 DIAGNOSIS — K644 Residual hemorrhoidal skin tags: Secondary | ICD-10-CM | POA: Diagnosis not present

## 2021-01-23 DIAGNOSIS — Z9049 Acquired absence of other specified parts of digestive tract: Secondary | ICD-10-CM | POA: Diagnosis not present

## 2021-01-23 DIAGNOSIS — D649 Anemia, unspecified: Secondary | ICD-10-CM | POA: Diagnosis not present

## 2021-01-23 DIAGNOSIS — K5790 Diverticulosis of intestine, part unspecified, without perforation or abscess without bleeding: Secondary | ICD-10-CM | POA: Diagnosis not present

## 2021-01-23 DIAGNOSIS — Z7951 Long term (current) use of inhaled steroids: Secondary | ICD-10-CM | POA: Diagnosis not present

## 2021-01-23 DIAGNOSIS — F419 Anxiety disorder, unspecified: Secondary | ICD-10-CM | POA: Diagnosis not present

## 2021-01-23 DIAGNOSIS — J440 Chronic obstructive pulmonary disease with acute lower respiratory infection: Secondary | ICD-10-CM | POA: Diagnosis not present

## 2021-01-23 DIAGNOSIS — J189 Pneumonia, unspecified organism: Secondary | ICD-10-CM | POA: Diagnosis not present

## 2021-01-23 DIAGNOSIS — Z9181 History of falling: Secondary | ICD-10-CM | POA: Diagnosis not present

## 2021-01-23 DIAGNOSIS — M810 Age-related osteoporosis without current pathological fracture: Secondary | ICD-10-CM | POA: Diagnosis not present

## 2021-01-23 DIAGNOSIS — J9621 Acute and chronic respiratory failure with hypoxia: Secondary | ICD-10-CM | POA: Diagnosis not present

## 2021-01-23 DIAGNOSIS — Z87891 Personal history of nicotine dependence: Secondary | ICD-10-CM | POA: Diagnosis not present

## 2021-01-23 DIAGNOSIS — I2781 Cor pulmonale (chronic): Secondary | ICD-10-CM | POA: Diagnosis not present

## 2021-01-25 ENCOUNTER — Telehealth: Payer: Self-pay | Admitting: Family Medicine

## 2021-01-25 DIAGNOSIS — J9621 Acute and chronic respiratory failure with hypoxia: Secondary | ICD-10-CM | POA: Diagnosis not present

## 2021-01-25 DIAGNOSIS — J440 Chronic obstructive pulmonary disease with acute lower respiratory infection: Secondary | ICD-10-CM | POA: Diagnosis not present

## 2021-01-25 DIAGNOSIS — J189 Pneumonia, unspecified organism: Secondary | ICD-10-CM | POA: Diagnosis not present

## 2021-01-25 DIAGNOSIS — I2781 Cor pulmonale (chronic): Secondary | ICD-10-CM | POA: Diagnosis not present

## 2021-01-25 DIAGNOSIS — K5790 Diverticulosis of intestine, part unspecified, without perforation or abscess without bleeding: Secondary | ICD-10-CM | POA: Diagnosis not present

## 2021-01-25 DIAGNOSIS — D649 Anemia, unspecified: Secondary | ICD-10-CM | POA: Diagnosis not present

## 2021-01-25 NOTE — Telephone Encounter (Signed)
Called and informed amy

## 2021-01-25 NOTE — Telephone Encounter (Signed)
ok 

## 2021-01-25 NOTE — Telephone Encounter (Signed)
[facility] from [facility] called requesting verbal for  PT evaluation,   She can be reached at [phone]

## 2021-01-29 DIAGNOSIS — D649 Anemia, unspecified: Secondary | ICD-10-CM | POA: Diagnosis not present

## 2021-01-29 DIAGNOSIS — J9621 Acute and chronic respiratory failure with hypoxia: Secondary | ICD-10-CM | POA: Diagnosis not present

## 2021-01-29 DIAGNOSIS — K5790 Diverticulosis of intestine, part unspecified, without perforation or abscess without bleeding: Secondary | ICD-10-CM | POA: Diagnosis not present

## 2021-01-29 DIAGNOSIS — J189 Pneumonia, unspecified organism: Secondary | ICD-10-CM | POA: Diagnosis not present

## 2021-01-29 DIAGNOSIS — I2781 Cor pulmonale (chronic): Secondary | ICD-10-CM | POA: Diagnosis not present

## 2021-01-29 DIAGNOSIS — J440 Chronic obstructive pulmonary disease with acute lower respiratory infection: Secondary | ICD-10-CM | POA: Diagnosis not present

## 2021-01-30 ENCOUNTER — Telehealth: Payer: Self-pay | Admitting: Internal Medicine

## 2021-01-30 LAB — CULTURE, FUNGUS WITHOUT SMEAR

## 2021-01-30 LAB — RESPIRATORY CULTURE OR RESPIRATORY AND SPUTUM CULTURE
MICRO NUMBER:: 11469934
RESULT:: NORMAL
SPECIMEN QUALITY:: ADEQUATE

## 2021-01-30 LAB — TIQ- AMBIGUOUS ORDER

## 2021-01-30 NOTE — Telephone Encounter (Signed)
ok 

## 2021-01-30 NOTE — Telephone Encounter (Signed)
[phone] was advised of the ok. [place]

## 2021-01-30 NOTE — Telephone Encounter (Signed)
[place] with [facility] [phone] called and needs verbal orders for PT-  2 times a week for 3 weeks, then 1 time a week for 1 week

## 2021-01-31 ENCOUNTER — Other Ambulatory Visit: Payer: Self-pay

## 2021-01-31 ENCOUNTER — Ambulatory Visit (INDEPENDENT_AMBULATORY_CARE_PROVIDER_SITE_OTHER): Payer: Medicare Other | Admitting: Podiatry

## 2021-01-31 ENCOUNTER — Encounter: Payer: Self-pay | Admitting: Podiatry

## 2021-01-31 DIAGNOSIS — N289 Disorder of kidney and ureter, unspecified: Secondary | ICD-10-CM | POA: Diagnosis not present

## 2021-01-31 DIAGNOSIS — M79676 Pain in unspecified toe(s): Secondary | ICD-10-CM

## 2021-01-31 DIAGNOSIS — B351 Tinea unguium: Secondary | ICD-10-CM

## 2021-01-31 NOTE — Progress Notes (Addendum)
This patient returns to my office for at risk foot care.  This patient requires this care by a professional since this patient will be at risk due to having  renal insufficiency.  This patient is unable to cut nails herself since the patient cannot reach her nails.These nails are painful walking and wearing shoes.  This patient presents for at risk foot care today.  Patient presents to the office with her sister.  General Appearance  Alert, conversant and in no acute stress.  Vascular  Dorsalis pedis and posterior tibial  pulses are weakly  palpable  bilaterally.  Capillary return is within normal limits  Bilaterally. Cold feet. Bilaterally.  Absent digital hair  B/L.  Neurologic  Senn-Weinstein monofilament wire test diminished   bilaterally. Muscle power within normal limits bilaterally.  Nails Thick disfigured discolored nails with subungual debris  from hallux to fifth toes bilaterally. No evidence of bacterial infection or drainage bilaterally.  Orthopedic  No limitations of motion  feet .  No crepitus or effusions noted.  No bony pathology or digital deformities noted.  Skin  normotropic skin with no porokeratosis noted bilaterally.  No signs of infections or ulcers noted.     Onychomycosis  Pain in right toes  Pain in left toes  Consent was obtained for treatment procedures.   Mechanical debridement of nails 1-5  bilaterally performed with a nail nipper.  Filed with dremel without incident.    Return office visit   3 months                   Told patient to return for periodic foot care and evaluation due to potential at risk complications.   Gardiner Barefoot DPM

## 2021-02-01 DIAGNOSIS — J440 Chronic obstructive pulmonary disease with acute lower respiratory infection: Secondary | ICD-10-CM | POA: Diagnosis not present

## 2021-02-01 DIAGNOSIS — I2781 Cor pulmonale (chronic): Secondary | ICD-10-CM | POA: Diagnosis not present

## 2021-02-01 DIAGNOSIS — D649 Anemia, unspecified: Secondary | ICD-10-CM | POA: Diagnosis not present

## 2021-02-01 DIAGNOSIS — K5790 Diverticulosis of intestine, part unspecified, without perforation or abscess without bleeding: Secondary | ICD-10-CM | POA: Diagnosis not present

## 2021-02-01 DIAGNOSIS — J189 Pneumonia, unspecified organism: Secondary | ICD-10-CM | POA: Diagnosis not present

## 2021-02-01 DIAGNOSIS — J9621 Acute and chronic respiratory failure with hypoxia: Secondary | ICD-10-CM | POA: Diagnosis not present

## 2021-02-06 DIAGNOSIS — D649 Anemia, unspecified: Secondary | ICD-10-CM | POA: Diagnosis not present

## 2021-02-06 DIAGNOSIS — J9621 Acute and chronic respiratory failure with hypoxia: Secondary | ICD-10-CM | POA: Diagnosis not present

## 2021-02-06 DIAGNOSIS — J440 Chronic obstructive pulmonary disease with acute lower respiratory infection: Secondary | ICD-10-CM | POA: Diagnosis not present

## 2021-02-06 DIAGNOSIS — I2781 Cor pulmonale (chronic): Secondary | ICD-10-CM | POA: Diagnosis not present

## 2021-02-06 DIAGNOSIS — K5790 Diverticulosis of intestine, part unspecified, without perforation or abscess without bleeding: Secondary | ICD-10-CM | POA: Diagnosis not present

## 2021-02-06 DIAGNOSIS — J189 Pneumonia, unspecified organism: Secondary | ICD-10-CM | POA: Diagnosis not present

## 2021-02-07 ENCOUNTER — Ambulatory Visit (INDEPENDENT_AMBULATORY_CARE_PROVIDER_SITE_OTHER): Payer: Medicare Other

## 2021-02-07 ENCOUNTER — Ambulatory Visit (INDEPENDENT_AMBULATORY_CARE_PROVIDER_SITE_OTHER): Payer: Medicare Other | Admitting: Internal Medicine

## 2021-02-07 ENCOUNTER — Encounter: Payer: Self-pay | Admitting: Internal Medicine

## 2021-02-07 ENCOUNTER — Other Ambulatory Visit: Payer: Self-pay

## 2021-02-07 DIAGNOSIS — R9389 Abnormal findings on diagnostic imaging of other specified body structures: Secondary | ICD-10-CM | POA: Diagnosis not present

## 2021-02-07 DIAGNOSIS — J449 Chronic obstructive pulmonary disease, unspecified: Secondary | ICD-10-CM

## 2021-02-07 DIAGNOSIS — J9612 Chronic respiratory failure with hypercapnia: Secondary | ICD-10-CM

## 2021-02-07 DIAGNOSIS — J439 Emphysema, unspecified: Secondary | ICD-10-CM | POA: Diagnosis not present

## 2021-02-07 DIAGNOSIS — R059 Cough, unspecified: Secondary | ICD-10-CM | POA: Diagnosis not present

## 2021-02-07 MED ORDER — PREDNISONE 10 MG PO TABS: ORAL_TABLET | ORAL | 0 refills | Status: DC

## 2021-02-07 MED ORDER — ACETAMINOPHEN-CODEINE #3 300-30 MG PO TABS: 1.0000 | ORAL_TABLET | ORAL | 0 refills | Status: AC | PRN

## 2021-02-07 MED ORDER — ACETAMINOPHEN-CODEINE #3 300-30 MG PO TABS: 1.0000 | ORAL_TABLET | ORAL | 0 refills | Status: DC | PRN

## 2021-02-07 NOTE — Patient Instructions (Addendum)
For cough/ congestion >  mucinex 1200 mg every 12 hours and flutter valve and tylenol #3 if can't stop up to every 4 hours as needed   For nasty color mucus > zpak  Try prilosec otc 20mg   Take 30-60 min before first meal of the day and Pepcid ac (famotidine) 20 mg one @  bedtime until cough is completely gone for at least a week without the need for cough suppression  Prednisone 10 mg take  4 each am x 2 days,   2 each am x 2 days,  1 each am x 2 days and stop    Please remember to go to the  x-ray department  for your tests - we will call you with the results when they are available     Please schedule a follow up office visit in 6 weeks, call sooner if needed

## 2021-02-07 NOTE — Progress Notes (Signed)
Subjective:    Patient ID: [name]     DOB: 07-29-1932     MRN: [id number]  Brief patient profile:  3  yowf quit smoking 02/2013  referred 02/08/2012 by Glade Lloyd for intermittent sob was on 02 but stopped in early 2013 and with GOLD III COPD documented 03/2012     History of Present Illness  11/09/2016  f/u ov/[name] re:   GOLD III / maint perforomist/ bud/Incruse/ prn saba neb  Chief Complaint  Patient presents with  . Follow-up    Pt called on 11/05/16 with c/o cough with yellow sputum- zpack and pred taper given. She is much improved and barely coughing at all at this point. Breathing is at her normal baseline.    baseline doe = MMRC3 = can't walk 100 yards even at a slow pace at a flat grade s stopping due to sob  Even on 02  rec For flares of cough/ wheeze/ short of breath assoc with change in mucus as you have in the past  >>> zpak / Prednisone 10 mg take  4 each am x 2 days,   2 each am x 2 days,  1 each am x 2 days and stop     11/12/2017  f/u ov/[name] re:  GOLD III/ maint  rx - took one cycle zpak /pred since last ov/ using med cal well / 2lpm 24/ 7 continuous, not pulsed Chief Complaint  Patient presents with  . Follow-up    2 liters O2, some sob with activity  MMRC3 = can't walk 100 yards even at a slow pace at a flat grade s stopping due to sob   No need for saba on perf/bud/incruse  rec trelegy     05/14/2019  f/u ov/[name] re: GOLD III spirometry/ 02 dep hs and with activity  Chief Complaint  Patient presents with  . Follow-up    States her breathing has been mostly good. She has good days and bad days. Reports a congested cough. Wants to qualify for innogen.   Dyspnea:  MMRC3 = can't walk 100 yards even at a slow pace at a flat grade s stopping due to sob  - can do mailbox and back flat 50 ft on 02 2lpm  Cough: more rattling x sev days / no color/ not using flutter  Sleeping:  Props up x pillows / bed is flat  SABA use:   02: 2lpm  rec Prednisone 10 mg  take  4 each am x 2 days,   2 each am x 2 days,  1 each am x 2 days and stop  For cough/congestion > mucinex dm up to 1200 mg every 12 hours and use the flutter valve as much as possbile Only use your albuterol as a rescue medication      08/10/2019  f/u ov/[name] re:  GOLD III   spirometry  But  02 dep 2lpm 24/7  maint on trelegy  Chief Complaint  Patient presents with  . Follow-up    Patient reports that she is doing better. She still has sob and cough. She reports not having to use her rescue inhaler.  Dyspnea:  MMRC3 = can't walk 100 yards even at a slow pace at a flat grade s stopping due to sob   Cough: none   Sleeping: props up on 2 pillows  SABA use: none  02: 2lpm 24/7 does not titrate     08/09/2020  f/u ov/[name] re: GOLD III spirometry / 02  dep / maint trelegy  Chief Complaint  Patient presents with  . Follow-up    pt wants to talk to about booster samples. pt wants samples for inhaler   Dyspnea:  MMRC3 = can't walk 100 yards even at a slow pace at a flat grade s stopping due to sob  = walking room to room  Cough: not much/ slt rattle  Sleeping: bed is flat/ 2 pillows SABA use: none 02: 2lpm 24/ 7 does not titrate  rec No change rx   02/07/2021  f/u ov/[name] re:  aecopd  On anoro  Chief Complaint  Patient presents with  . Follow-up    Increased cough over the past 2 days- prod with clear sputum. She is using her albuterol inhaler once per day on average and neb with albuterol 2 x per day.   Dyspnea:  Room to room / no outdoor walking  Cough: worse x 2 days esp hs and in am x 2 days > only min clear sputum Sleeping: bed is flat with wedge x 45 degrees  SABA use: only used 2 x days  02: 2lpm 24/7  Covid status:   vax x 3    No obvious day to day or daytime variability or assoc excess/ purulent sputum or mucus plugs or hemoptysis or cp or chest tightness, subjective wheeze or overt sinus or hb symptoms.    Also denies any obvious fluctuation of symptoms with weather  or environmental changes or other aggravating or alleviating factors except as outlined above   No unusual exposure hx or h/o childhood pna/ asthma or knowledge of premature birth.  Current Allergies, Complete Past Medical History, Past Surgical History, Family History, and Social History were reviewed in [employer] record.  ROS  The following are not active complaints unless bolded Hoarseness, sore throat, dysphagia, dental problems, itching, sneezing,  nasal congestion or discharge of excess mucus or purulent secretions, ear ache,   fever, chills, sweats, unintended wt loss or wt gain, classically pleuritic or exertional cp,  orthopnea pnd or arm/hand swelling  or leg swelling, presyncope, palpitations, abdominal pain, anorexia, nausea, vomiting, diarrhea  or change in bowel habits or change in bladder habits, change in stools or change in urine, dysuria, hematuria,  rash, arthralgias, visual complaints, headache, numbness, weakness or ataxia or problems with walking or coordination,  change in mood or  memory.        Current Meds  Medication Sig  . acetaminophen (TYLENOL) 650 MG CR tablet Take 650 mg by mouth every 8 (eight) hours as needed for pain.  Marland Kitchen albuterol (PROVENTIL) (2.5 MG/3ML) 0.083% nebulizer solution Take 3 mLs (2.5 mg total) by nebulization every 6 (six) hours as needed for wheezing or shortness of breath.  Marland Kitchen albuterol (VENTOLIN HFA) 108 (90 Base) MCG/ACT inhaler Inhale 2 puffs into the lungs every 4 (four) hours as needed for wheezing or shortness of breath (((PLAN A))). Reported on 01/12/2016  . ALPRAZolam (XANAX) 0.25 MG tablet Take 0.5 tablets (0.125 mg total) by mouth daily as needed for up to 10 doses for anxiety.  . brimonidine (ALPHAGAN) 0.2 % ophthalmic solution Place 1 drop into the right eye 3 (three) times daily.   . Cholecalciferol (VITAMIN D) 50 MCG (2000 UT) CAPS Take 1 capsule by mouth daily.  Marland Kitchen dextromethorphan-guaiFENesin (MUCINEX DM) 30-600  MG per 12 hr tablet Take 1 tablet by mouth at bedtime as needed (with flutter for cough, congestion, and thick mucus).   . OXYGEN Use  2L of O2 continuously  . sodium chloride HYPERTONIC 3 % nebulizer solution Take by nebulization as needed for other.  .     . umeclidinium-vilanterol (ANORO ELLIPTA) 62.5-25 MCG/INH AEPB Inhale 1 puff into the lungs daily.                    Objective:   Physical Exam      02/07/2021    133 08/09/2020   135  08/10/2019   137  Wt 97 02/08/2012 > 03/24/2012  99 > 103 05/09/2012 > 06/12/2012  99 >   09/24/2012  96 > 98 01/05/2013 >  106 05/01/2013 > 05/05/2013  99 > 05/15/2013  104 > 06/01/2013 115 > 115 07/07/2013 > 114  07/17/13 > 127 09/18/2013 > 12/01/2013  131 >135 03/04/2014 > 03/12/2014  133 > 05/27/2014  133  > 06/28/2014  133 >131 07/26/2014 >133 09/21/2014 139 >  04/14/2015  138  >  05/08/2016  146 > 05/10/2017    145 >  11/12/2017  134 >  11/03/2018   144 > 05/14/2019  140   Vital signs reviewed  02/07/2021  - Note at rest 02 sats  95% on 2lpm POC    General appearance:   Chronically ill appearing   elderly wf w/c bound  HEENT : pt wearing mask not removed for exam due to covid -19 concerns.    NECK :  without JVD/Nodes/TM/ nl carotid upstrokes bilaterally   LUNGS: no acc muscle use,  Mod barrel  contour chest wall with bilateral  Distant bs s audible wheeze and  without cough on insp or exp maneuvers and mod  Hyperresonant  to  percussion bilaterally     CV:  RRR  no s3 or murmur or increase in P2, and no edema   ABD:  soft and nontender with pos mid insp Hoover's  in the supine position. No bruits or organomegaly appreciated, bowel sounds nl  MS:     ext warm without deformities, calf tenderness, cyanosis or clubbing No obvious joint restrictions   SKIN: warm and dry without lesions    NEURO:  alert, approp, nl sensorium with  no motor or cerebellar deficits apparent.       CXR PA and Lateral:   02/07/2021 :    I personally reviewed images and agree with  radiology impression as follows:   1. Probable area of chronic post infectious or inflammatory scarring in the right mid lung. Given the lack of resolution, if there is clinical concern for underlying malignancy, follow-up contrast enhanced chest CT would be recommended in the near future to better evaluate these findings. 2. Emphysema. My review:  Area is much less dense vs priors c/w scarring p inflammatory infiltrates             Assessment & Plan:

## 2021-02-08 DIAGNOSIS — I2781 Cor pulmonale (chronic): Secondary | ICD-10-CM | POA: Diagnosis not present

## 2021-02-08 DIAGNOSIS — J189 Pneumonia, unspecified organism: Secondary | ICD-10-CM | POA: Diagnosis not present

## 2021-02-08 DIAGNOSIS — D649 Anemia, unspecified: Secondary | ICD-10-CM | POA: Diagnosis not present

## 2021-02-08 DIAGNOSIS — J9621 Acute and chronic respiratory failure with hypoxia: Secondary | ICD-10-CM | POA: Diagnosis not present

## 2021-02-08 DIAGNOSIS — K5790 Diverticulosis of intestine, part unspecified, without perforation or abscess without bleeding: Secondary | ICD-10-CM | POA: Diagnosis not present

## 2021-02-08 DIAGNOSIS — J440 Chronic obstructive pulmonary disease with acute lower respiratory infection: Secondary | ICD-10-CM | POA: Diagnosis not present

## 2021-02-09 ENCOUNTER — Other Ambulatory Visit: Payer: Self-pay

## 2021-02-09 ENCOUNTER — Encounter: Payer: Self-pay | Admitting: Internal Medicine

## 2021-02-09 ENCOUNTER — Other Ambulatory Visit: Payer: Medicare Other

## 2021-02-09 VITALS — BP 142/82 | HR 76 | Temp 97.8°F | Resp 22 | Wt 133.0 lb

## 2021-02-09 DIAGNOSIS — K5790 Diverticulosis of intestine, part unspecified, without perforation or abscess without bleeding: Secondary | ICD-10-CM | POA: Diagnosis not present

## 2021-02-09 DIAGNOSIS — J440 Chronic obstructive pulmonary disease with acute lower respiratory infection: Secondary | ICD-10-CM | POA: Diagnosis not present

## 2021-02-09 DIAGNOSIS — J189 Pneumonia, unspecified organism: Secondary | ICD-10-CM | POA: Diagnosis not present

## 2021-02-09 DIAGNOSIS — Z515 Encounter for palliative care: Secondary | ICD-10-CM

## 2021-02-09 DIAGNOSIS — I2781 Cor pulmonale (chronic): Secondary | ICD-10-CM | POA: Diagnosis not present

## 2021-02-09 DIAGNOSIS — D649 Anemia, unspecified: Secondary | ICD-10-CM | POA: Diagnosis not present

## 2021-02-09 DIAGNOSIS — J9621 Acute and chronic respiratory failure with hypoxia: Secondary | ICD-10-CM | POA: Diagnosis not present

## 2021-02-09 NOTE — Assessment & Plan Note (Signed)
Onset 02 rx 2013 initially just at hs   - ono RA 05/14/12   4 h 33 min < 89%   So ordered 02 2lpm    - HCO3  32 05/10/13    - 01/05/2013   Walked RA x one lap @ 185 stopped due to  88%   - 01/05/2013  Walked 2lpm 2 laps @ 185 ft each stopped due to  90% sob but improved vs baseline   - RA 05/01/2013 sats = 87%  - 05/10/2017 > 02 sats 88% RA > referred for POC eval > preferred continuous flow - 05/14/2019   Walked RA  1 laps @  approx 185ft each @ slow pace  stopped due to sob and desats to 84% corrected on 2lpm and completed lap s desats  rec as of 02/07/2021  2lpm 24/7

## 2021-02-09 NOTE — Assessment & Plan Note (Signed)
Quit smoking 02/2013   - PFTs 03/24/2012  FEV1  0.91 (43%) ratio 48 and no better p B2,  DLCO 38% corrects to 53%  - changed to perforomist/bud bid 04/2013  -2015 pulmonary rehab  - med calendar 07/17/13 > did not bring 06/28/2014 , .redone 07/26/2014  - 06/28/2014 p extensive coaching HFA effectiveness =    75% (short Ti) - 08/24/14 started IMPACT trial >finished 08/2015  - PFTs 12/13/14  FEV1 0.85 (38%) ratio 45  p 5 % improvement from saba  09/20/2015 Med calendar  - 05/08/2016 changed lama to incruse  - 11/12/2017  After extensive coaching HFA effectiveness =   75% (short Ti) - 11/12/17 > changed to trelegy   - 02/07/2021  Flare of cough on anoro >  Try gerd rx but continue anoro for now   Her cough is dry and upper airway and may be related to dpi but for now rec   1) mucinex dm plus flutter plus tyl #3 prn  2) Prednisone 10 mg take  4 each am x 2 days,   2 each am x 2 days,  1 each am x 2 days and stop   3)  ? Acid (or non-acid) GERD > always difficult to exclude as up to 75% of pts in some series report no assoc GI/ Heartburn symptoms> rec max (24h)  acid suppression and diet restrictions/ reviewed and instructions given in writing.

## 2021-02-09 NOTE — Assessment & Plan Note (Addendum)
cxr's reviewed, looks like scarring R Mid lung no further imaging needed   >>> f/u in 6 weeks    Medical decision making was a moderate level of complexity in this case because of > two chronic conditions /diagnoses requiring extra time for  H and P, chart review, counseling,  and generating customized AVS unique to this office visit and charting.   Each maintenance medication was reviewed in detail including emphasizing most importantly the difference between maintenance and prns and under what circumstances the prns are to be triggered using an action plan format where appropriate. Please see avs for details which were reviewed in writing by both me and my nurse and patient given a written copy highlighted where appropriate with yellow highlighter for the patient's continued care at home along with an updated version of their medications.  Patient was asked to maintain medication reconciliation by comparing this list to the actual medications being used at home and to contact this office right away if there is a conflict or discrepancy.

## 2021-02-10 ENCOUNTER — Ambulatory Visit: Payer: Medicare Other | Admitting: Internal Medicine

## 2021-02-10 NOTE — Progress Notes (Signed)
PATIENT NAME: ISABELLAMARIE RANDA DOB: [birth year] MRN: [id number]  PRIMARY CARE PROVIDER: Denita Lung, MD  RESPONSIBLE PARTY:  Acct ID - Guarantor Home Phone Work Phone Relationship Acct Type  [id number] - [place],[phone]  Self P/F     [name], [place] [address]    PLAN OF CARE and INTERVENTIONS:               1.  GOALS OF CARE/ ADVANCE CARE PLANNING:  Remain home and independent.               2.  PATIENT/CAREGIVER EDUCATION:  Xanax               4. PERSONAL EMERGENCY PLAN:  Activate 911 for emergencies.               5.  DISEASE STATUS:   Patient states she is doing better overall.  She was seen by pulmonology this week and completed another CXR.  She is waiting for her results.  Patient states she continues with a cough but sputum has been clear.  She is doing breathing treatments and flutter valve and feels this has been helpful.  Dyspnea with exertion present but patient states she is recovering quickly with rest.    Patient continues with PT and feels this has been very beneficial.  She states this is continuing for now and she has not been given a stop date with therapy.  Patient is not using assistive devices with ambulation.  She denies any recent falls.   Patient notes the nsg visits will stop in March.  Patient denies issues with insomnia.  She states she is sleeping well.  Patient typically takes an afternoon nap.  Reviewed medications.  Patient states she is not taking xanax and does not feel this is necessary. Education provided on the purpose of xanax should she require this.  All other medications are taking as prescribed.  Patient notes she does have a zpak should this be required.    HISTORY OF PRESENT ILLNESS:  87 year old female with COPD.  Patient is being followed by Palliative Care monthly and PRN.  CODE STATUS: Full ADVANCED DIRECTIVES: No MOST FORM: No PPS: 50%   PHYSICAL EXAM:   VITALS: Temp 97.8 F, P 76 BP 142/82 LUNGS: Diminished  breath sounds. CARDIAC: HRR EXTREMITIES: no edema SKIN: no skin breakdown reported. NEURO: alert and oriented x4      Lorenza Burton, RN

## 2021-02-10 NOTE — Progress Notes (Signed)
Called and left message on voicemail to please return phone call to go over results. Contact number provided. 

## 2021-02-13 DIAGNOSIS — D649 Anemia, unspecified: Secondary | ICD-10-CM | POA: Diagnosis not present

## 2021-02-13 DIAGNOSIS — J440 Chronic obstructive pulmonary disease with acute lower respiratory infection: Secondary | ICD-10-CM | POA: Diagnosis not present

## 2021-02-13 DIAGNOSIS — J189 Pneumonia, unspecified organism: Secondary | ICD-10-CM | POA: Diagnosis not present

## 2021-02-13 DIAGNOSIS — J9621 Acute and chronic respiratory failure with hypoxia: Secondary | ICD-10-CM | POA: Diagnosis not present

## 2021-02-13 DIAGNOSIS — I2781 Cor pulmonale (chronic): Secondary | ICD-10-CM | POA: Diagnosis not present

## 2021-02-13 DIAGNOSIS — K5790 Diverticulosis of intestine, part unspecified, without perforation or abscess without bleeding: Secondary | ICD-10-CM | POA: Diagnosis not present

## 2021-02-13 NOTE — Progress Notes (Signed)
LMTCB

## 2021-02-14 ENCOUNTER — Encounter: Payer: Self-pay | Admitting: *Deleted

## 2021-02-14 NOTE — Progress Notes (Signed)
Letter mailed to the pt. 

## 2021-02-15 DIAGNOSIS — K5790 Diverticulosis of intestine, part unspecified, without perforation or abscess without bleeding: Secondary | ICD-10-CM | POA: Diagnosis not present

## 2021-02-15 DIAGNOSIS — D649 Anemia, unspecified: Secondary | ICD-10-CM | POA: Diagnosis not present

## 2021-02-15 DIAGNOSIS — J189 Pneumonia, unspecified organism: Secondary | ICD-10-CM | POA: Diagnosis not present

## 2021-02-15 DIAGNOSIS — I2781 Cor pulmonale (chronic): Secondary | ICD-10-CM | POA: Diagnosis not present

## 2021-02-15 DIAGNOSIS — J440 Chronic obstructive pulmonary disease with acute lower respiratory infection: Secondary | ICD-10-CM | POA: Diagnosis not present

## 2021-02-15 DIAGNOSIS — J9621 Acute and chronic respiratory failure with hypoxia: Secondary | ICD-10-CM | POA: Diagnosis not present

## 2021-02-16 ENCOUNTER — Telehealth: Payer: Self-pay | Admitting: Internal Medicine

## 2021-02-16 DIAGNOSIS — I2781 Cor pulmonale (chronic): Secondary | ICD-10-CM | POA: Diagnosis not present

## 2021-02-16 DIAGNOSIS — D649 Anemia, unspecified: Secondary | ICD-10-CM | POA: Diagnosis not present

## 2021-02-16 DIAGNOSIS — K5790 Diverticulosis of intestine, part unspecified, without perforation or abscess without bleeding: Secondary | ICD-10-CM | POA: Diagnosis not present

## 2021-02-16 DIAGNOSIS — J440 Chronic obstructive pulmonary disease with acute lower respiratory infection: Secondary | ICD-10-CM | POA: Diagnosis not present

## 2021-02-16 DIAGNOSIS — J9621 Acute and chronic respiratory failure with hypoxia: Secondary | ICD-10-CM | POA: Diagnosis not present

## 2021-02-16 DIAGNOSIS — J189 Pneumonia, unspecified organism: Secondary | ICD-10-CM | POA: Diagnosis not present

## 2021-02-16 MED ORDER — PREDNISONE 10 MG PO TABS: ORAL_TABLET | ORAL | 1 refills | Status: DC

## 2021-02-16 NOTE — Telephone Encounter (Signed)
Called and spoke with Amy from [facility]. Amy said that when she went to see pt yesterday 2/23, pt was experiencing increased SOB that was happening even at rest. Amy said that when she listed to pt's lungs, her lungs were not clear as she could hear rhonchi when listening.  Pt had told Amy that she was not using her nebulizer so she did have pt start using her nebulizer again to see if that would help her feel better. Amy said that pt did two neb treatments yesterday 2/23.  Amy said that today, 2/24 Einar Pheasant went out to see pt and same thing was told that pt's lungs do not sound clear at all. Pt was also walked and when walked, O2 sats dropped to 85% on pt's 2L O2. Amy said that Einar Pheasant told her that pt did recover quick back up to 92% when resting.  Amy also said that pt has done one neb treatment so far today 2/24.  Pt finished prednisone 2/21. Pt has not had any fever as temp today was 97.4  Pt is not coughing up any phlegm  Pt is using her anoro inhaler as prescribed. Amy is unsure if pt has been using rescue inhaler. She also stated that pt told her she had been using her flutter valve as well as the incentive spirometer.  Due to pt's symptoms that started yesterday 2/23, Amy is wanting recommendations. Dr. Melvyn Novas, please advise.  Patient Instructions by Tanda Rockers, MD at 02/07/2021 11:15 AM  Author: Tanda Rockers, MD Author Type: Physician Filed: 02/07/2021 12:07 PM  Note Status: Addendum Cosign: Cosign Not Required Encounter Date: 02/07/2021  Editor: Tanda Rockers, MD (Physician)      Prior Versions: 1. Tanda Rockers, MD (Physician) at 02/07/2021 12:07 PM - Addendum   2. Tanda Rockers, MD (Physician) at 02/07/2021 12:04 PM - Addendum   3. Tanda Rockers, MD (Physician) at 02/07/2021 12:03 PM - Addendum   4. Tanda Rockers, MD (Physician) at 02/07/2021 12:02 PM - Addendum   5. Tanda Rockers, MD (Physician) at 02/07/2021 11:58 AM - Signed    For cough/ congestion >  mucinex  1200 mg every 12 hours and flutter valve and tylenol #3 if can't stop up to every 4 hours as needed   For nasty color mucus > zpak  Try prilosec otc 20mg   Take 30-60 min before first meal of the day and Pepcid ac (famotidine) 20 mg one @  bedtime until cough is completely gone for at least a week without the need for cough suppression  Prednisone 10 mg take  4 each am x 2 days,   2 each am x 2 days,  1 each am x 2 days and stop    Please remember to go to the  x-ray department  for your tests - we will call you with the results when they are available     Please schedule a follow up office visit in 6 weeks, call sooner if needed

## 2021-02-16 NOTE — Telephone Encounter (Signed)
Called and spoke with [name] letting her know the info stated by Dr. Melvyn Novas and she verbalized understanding. Rx for prednisone has been sent to preferred pharmacy for pt. Nothing further needed.

## 2021-02-16 NOTE — Telephone Encounter (Signed)
Prednisone 10 mg take 2 each am until better then just stay on 10 mg daily until seen

## 2021-02-17 ENCOUNTER — Telehealth: Payer: Self-pay | Admitting: Internal Medicine

## 2021-02-17 NOTE — Telephone Encounter (Signed)
Spoke with Amy from [facility]. She stated that the patient started her zpak yesterday and sounds and feels better today. When Amy came to visit her this morning, she stated that she was on 2L of O2 but her O2 was reading 84%.   I advised her that it would be ok for her increase the O2 to 3L and I would send a message over to [facility].   She also wanted to know if she needed to be seen sooner than 03/21/21. Her family is concerned that she is going downhill fast. Amy also stated that her insurance will not pay for [facility] services and she is scheduled to be discharged soon but she does not feel safe discharging from [facility]. She is aware that palliative care is also involved.   MW, can you please advise? Thanks!

## 2021-02-17 NOTE — Telephone Encounter (Signed)
[name] stated that this is the only time that her oxygen drops is when she gets up to ambulate.  She will talk to the pts daughter and have her call for an appt next week.

## 2021-02-17 NOTE — Telephone Encounter (Signed)
Adjust 02 to sats > 90%   - if can't keep over 90 on max 02 time to let Hospice take over at home or admit but nothing else to offer over the phone  Happy to see in office if can get her in.

## 2021-02-20 DIAGNOSIS — J189 Pneumonia, unspecified organism: Secondary | ICD-10-CM | POA: Diagnosis not present

## 2021-02-20 DIAGNOSIS — D649 Anemia, unspecified: Secondary | ICD-10-CM | POA: Diagnosis not present

## 2021-02-20 DIAGNOSIS — I2781 Cor pulmonale (chronic): Secondary | ICD-10-CM | POA: Diagnosis not present

## 2021-02-20 DIAGNOSIS — K5790 Diverticulosis of intestine, part unspecified, without perforation or abscess without bleeding: Secondary | ICD-10-CM | POA: Diagnosis not present

## 2021-02-20 DIAGNOSIS — J9621 Acute and chronic respiratory failure with hypoxia: Secondary | ICD-10-CM | POA: Diagnosis not present

## 2021-02-20 DIAGNOSIS — J440 Chronic obstructive pulmonary disease with acute lower respiratory infection: Secondary | ICD-10-CM | POA: Diagnosis not present

## 2021-02-21 ENCOUNTER — Other Ambulatory Visit: Payer: Self-pay

## 2021-02-21 ENCOUNTER — Ambulatory Visit (INDEPENDENT_AMBULATORY_CARE_PROVIDER_SITE_OTHER): Payer: Medicare Other | Admitting: Internal Medicine

## 2021-02-21 ENCOUNTER — Other Ambulatory Visit: Payer: Self-pay | Admitting: Pulmonary Disease

## 2021-02-21 ENCOUNTER — Encounter: Payer: Self-pay | Admitting: Internal Medicine

## 2021-02-21 DIAGNOSIS — J449 Chronic obstructive pulmonary disease, unspecified: Secondary | ICD-10-CM

## 2021-02-21 DIAGNOSIS — J9621 Acute and chronic respiratory failure with hypoxia: Secondary | ICD-10-CM | POA: Diagnosis not present

## 2021-02-21 MED ORDER — FORMOTEROL FUMARATE 20 MCG/2ML IN NEBU: 20.0000 ug | INHALATION_SOLUTION | Freq: Two times a day (BID) | RESPIRATORY_TRACT | 11 refills | Status: DC

## 2021-02-21 MED ORDER — YUPELRI 175 MCG/3ML IN SOLN: 175.0000 ug | Freq: Every day | RESPIRATORY_TRACT | 0 refills | Status: DC

## 2021-02-21 NOTE — Progress Notes (Signed)
Subjective:    Patient ID: [name]     DOB: 07-29-1932     MRN: [id number]  Brief patient profile:  3  yowf quit smoking 02/2013  referred 02/08/2012 by Glade Lloyd for intermittent sob was on 02 but stopped in early 2013 and with GOLD III COPD documented 03/2012     History of Present Illness  11/09/2016  f/u ov/Wert re:   GOLD III / maint perforomist/ bud/Incruse/ prn saba neb  Chief Complaint  Patient presents with  . Follow-up    Pt called on 11/05/16 with c/o cough with yellow sputum- zpack and pred taper given. She is much improved and barely coughing at all at this point. Breathing is at her normal baseline.    baseline doe = MMRC3 = can't walk 100 yards even at a slow pace at a flat grade s stopping due to sob  Even on 02  rec For flares of cough/ wheeze/ short of breath assoc with change in mucus as you have in the past  >>> zpak / Prednisone 10 mg take  4 each am x 2 days,   2 each am x 2 days,  1 each am x 2 days and stop     11/12/2017  f/u ov/Wert re:  GOLD III/ maint  rx - took one cycle zpak /pred since last ov/ using med cal well / 2lpm 24/ 7 continuous, not pulsed Chief Complaint  Patient presents with  . Follow-up    2 liters O2, some sob with activity  MMRC3 = can't walk 100 yards even at a slow pace at a flat grade s stopping due to sob   No need for saba on perf/bud/incruse  rec trelegy     05/14/2019  f/u ov/Wert re: GOLD III spirometry/ 02 dep hs and with activity  Chief Complaint  Patient presents with  . Follow-up    States her breathing has been mostly good. She has good days and bad days. Reports a congested cough. Wants to qualify for innogen.   Dyspnea:  MMRC3 = can't walk 100 yards even at a slow pace at a flat grade s stopping due to sob  - can do mailbox and back flat 50 ft on 02 2lpm  Cough: more rattling x sev days / no color/ not using flutter  Sleeping:  Props up x pillows / bed is flat  SABA use:   02: 2lpm  rec Prednisone 10 mg  take  4 each am x 2 days,   2 each am x 2 days,  1 each am x 2 days and stop  For cough/congestion > mucinex dm up to 1200 mg every 12 hours and use the flutter valve as much as possbile Only use your albuterol as a rescue medication      08/10/2019  f/u ov/Wert re:  GOLD III   spirometry  But  02 dep 2lpm 24/7  maint on trelegy  Chief Complaint  Patient presents with  . Follow-up    Patient reports that she is doing better. She still has sob and cough. She reports not having to use her rescue inhaler.  Dyspnea:  MMRC3 = can't walk 100 yards even at a slow pace at a flat grade s stopping due to sob   Cough: none   Sleeping: props up on 2 pillows  SABA use: none  02: 2lpm 24/7 does not titrate     08/09/2020  f/u ov/Wert re: GOLD III spirometry / 02  dep / maint trelegy  Chief Complaint  Patient presents with  . Follow-up    pt wants to talk to about booster samples. pt wants samples for inhaler   Dyspnea:  MMRC3 = can't walk 100 yards even at a slow pace at a flat grade s stopping due to sob  = walking room to room  Cough: not much/ slt rattle  Sleeping: bed is flat/ 2 pillows SABA use: none 02: 2lpm 24/ 7 does not titrate  rec No change rx   02/07/2021  f/u ov/Wert re:  aecopd  On anoro  Chief Complaint  Patient presents with  . Follow-up    Increased cough over the past 2 days- prod with clear sputum. She is using her albuterol inhaler once per day on average and neb with albuterol 2 x per day.   Dyspnea:  Room to room / no outdoor walking  Cough: worse x 2 days esp hs and in am x 2 days > only min clear sputum Sleeping: bed is flat with wedge x 45 degrees  SABA use: only used 2 x days  02: 2lpm 24/7  Covid status:   vax x 3  rec For cough/ congestion >  mucinex 1200 mg every 12 hours and flutter valve and tylenol #3 if can't stop up to every 4 hours as needed  For nasty color mucus > zpak Try prilosec otc 20mg   Take 30-60 min before first meal of the day and Pepcid ac  (famotidine) 20 mg one @  bedtime until cough is completely gone for at least a week without the need for cough suppression Prednisone 10 mg take  4 each am x 2 days,   2 each am x 2 days,  1 each am x 2 days and stop      02/21/2021  Acute  ov/Wert re: anoro daily / zpak finishing  Chief Complaint  Patient presents with  . Acute Visit    Increased SOB and cough x 2 days.   Dyspnea:  Able cross the house on 2.5 lpm = about 50 ft and not checking sats walking or adjusting 02  Cough: rattling / using flutter / mucinex / flutter / hypertonic saline  Sleeping: 45 degrees and resting ok s resp flares noct  SABA use: nebulizer every 4 hours  02: 2.5 24/7 Covid status:   vax x 3   No obvious day to day or daytime variability or assoc ongoing excess/ purulent sputum or mucus plugs or hemoptysis or cp or chest tightness, subjective wheeze or overt sinus or hb symptoms.   Sleeping better now as above without nocturnal  or early am exacerbation  of respiratory  c/o's or need for noct saba. Also denies any obvious fluctuation of symptoms with weather or environmental changes or other aggravating or alleviating factors except as outlined above   No unusual exposure hx or h/o childhood pna/ asthma or knowledge of premature birth.  Current Allergies, Complete Past Medical History, Past Surgical History, Family History, and Social History were reviewed in [facility] record.  ROS  The following are not active complaints unless bolded Hoarseness, sore throat, dysphagia, dental problems, itching, sneezing,  nasal congestion or discharge of excess mucus or purulent secretions, ear ache,   fever, chills, sweats, unintended wt loss or wt gain, classically pleuritic or exertional cp,  orthopnea pnd or arm/hand swelling  or leg swelling, presyncope, palpitations, abdominal pain, anorexia, nausea, vomiting, diarrhea  or change in bowel habits  or change in bladder habits, change in stools or  change in urine, dysuria, hematuria,  rash, arthralgias, visual complaints, headache, numbness, weakness or ataxia or problems with walking or coordination,  change in mood or  memory.        Current Meds  Medication Sig  . acetaminophen (TYLENOL) 650 MG CR tablet Take 650 mg by mouth every 8 (eight) hours as needed for pain.  Marland Kitchen albuterol (PROVENTIL) (2.5 MG/3ML) 0.083% nebulizer solution Take 3 mLs (2.5 mg total) by nebulization every 6 (six) hours as needed for wheezing or shortness of breath.  Marland Kitchen albuterol (VENTOLIN HFA) 108 (90 Base) MCG/ACT inhaler Inhale 2 puffs into the lungs every 4 (four) hours as needed for wheezing or shortness of breath (((PLAN A))). Reported on 01/12/2016  . ALPRAZolam (XANAX) 0.25 MG tablet Take 0.5 tablets (0.125 mg total) by mouth daily as needed for up to 10 doses for anxiety.  . brimonidine (ALPHAGAN) 0.2 % ophthalmic solution Place 1 drop into the right eye 3 (three) times daily.   . Cholecalciferol (VITAMIN D) 50 MCG (2000 UT) CAPS Take 1 capsule by mouth daily.  Marland Kitchen dextromethorphan-guaiFENesin (MUCINEX DM) 30-600 MG per 12 hr tablet Take 1 tablet by mouth at bedtime as needed (with flutter for cough, congestion, and thick mucus).   . OXYGEN Use 2L of O2 continuously  . predniSONE (DELTASONE) 10 MG tablet Take 2 each AM until better then take 1 each AM until seen back at office  . sodium chloride HYPERTONIC 3 % nebulizer solution Take by nebulization as needed for other.  . umeclidinium-vilanterol (ANORO ELLIPTA) 62.5-25 MCG/INH AEPB Inhale 1 puff into the lungs daily.               Objective:   Physical Exam     02/21/2021      131 02/07/2021    133 08/09/2020    135  08/10/2019    137  05/14/2019  140  Wt 97 02/08/2012 > 03/24/2012  99 > 103 05/09/2012 > 06/12/2012  99 >   09/24/2012  96 > 98 01/05/2013 >  106 05/01/2013 > 05/05/2013  99 > 05/15/2013  104 > 06/01/2013 115 > 115 07/07/2013 > 114  07/17/13 > 127 09/18/2013 > 12/01/2013  131 >135 03/04/2014 > 03/12/2014  133  > 05/27/2014  133  > 06/28/2014  133 >131 07/26/2014 >133 09/21/2014 139 >  04/14/2015  138  >  05/08/2016  146 > 05/10/2017    145 >  11/12/2017  134 >  11/03/2018   144 >   Vital signs reviewed  02/21/2021  - Note at rest 02 sats  91% on 2.5 lpm POC   General appearance:    Chronically ill w/c bound elderly wf / min congested sounding cough to voluntary maneurver     HEENT : pt wearing mask not removed for exam due to covid -19 concerns.    NECK :  without JVD/Nodes/TM/ nl carotid upstrokes bilaterally   LUNGS: no acc muscle use,  Mod barrel  contour chest wall with bilateral  Distant bs s audible wheeze and  without cough on insp or exp maneuvers and mod  Hyperresonant  to  percussion bilaterally     CV:  RRR  no s3 or murmur or increase in P2, and no edema   ABD:  soft and nontender with pos mid insp Hoover's  in the supine position. No bruits or organomegaly appreciated, bowel sounds nl  MS:     ext  warm without deformities, calf tenderness, cyanosis or clubbing No obvious joint restrictions   SKIN: warm and dry without lesions    NEURO:  alert, approp, nl sensorium with  no motor or cerebellar deficits apparent.                 Assessment & Plan:

## 2021-02-21 NOTE — Assessment & Plan Note (Signed)
Onset 02 rx 2013 initially just at hs   - ono RA 05/14/12   4 h 33 min < 89%   So ordered 02 2lpm    - HCO3  32 05/10/13    - 01/05/2013   Walked RA x one lap @ 185 stopped due to  88%   - 01/05/2013  Walked 2lpm 2 laps @ 185 ft each stopped due to  90% sob but improved vs baseline   - RA 05/01/2013 sats = 87%  - 05/10/2017 > 02 sats 88% RA > referred for POC eval > preferred continuous flow - 05/14/2019   Walked RA  1 laps @  approx 122ft each @ slow pace  stopped due to sob and desats to 84% corrected on 2lpm and completed lap s desats  rec as of 02/21/2021  2.5 lpm but ok to titrate with activity with target > 90%   Advised: Make sure you check your oxygen saturation  at your highest level of activity  to be sure it stays over 90% and adjust  02 flow upward to maintain this level if needed but remember to turn it back to previous settings when you stop (to conserve your supply).   Each maintenance medication was reviewed in detail including emphasizing most importantly the difference between maintenance and prns and under what circumstances the prns are to be triggered using an action plan format where appropriate.  Total time for H and P, chart review, counseling, reviewing neb meds/ device(s) , directly observing portions of ambulatory 02 saturation study/ and generating customized AVS unique to this office visit / same day charting = 39 min

## 2021-02-21 NOTE — Assessment & Plan Note (Deleted)
Onset 02 rx 2013 initially just at hs   - ono RA 05/14/12   4 h 33 min < 89%   So ordered 02 2lpm    - HCO3  32 05/10/13    - 01/05/2013   Walked RA x one lap @ 185 stopped due to  88%   - 01/05/2013  Walked 2lpm 2 laps @ 185 ft each stopped due to  90% sob but improved vs baseline   - RA 05/01/2013 sats = 87%  - 05/10/2017 > 02 sats 88% RA > referred for POC eval > preferred continuous flow - 05/14/2019   Walked RA  1 laps @  approx 167ft each @ slow pace  stopped due to sob and desats to 84% corrected on 2lpm and completed lap s desats  rec as of 02/21/2021  2.5 lpm but ok to titrate with activity with target > 90%   Advised: Make sure you check your oxygen saturation  at your highest level of activity  to be sure it stays over 90% and adjust  02 flow upward to maintain this level if needed but remember to turn it back to previous settings when you stop (to conserve your supply).

## 2021-02-21 NOTE — Assessment & Plan Note (Signed)
Quit smoking 02/2013   - PFTs 03/24/2012  FEV1  0.91 (43%) ratio 48 and no better p B2,  DLCO 38% corrects to 53%  - changed to perforomist/bud bid 04/2013  -2015 pulmonary rehab  - med calendar 07/17/13 > did not bring 06/28/2014 , .redone 07/26/2014  - 06/28/2014 p extensive coaching HFA effectiveness =    75% (short Ti) - 08/24/14 started IMPACT trial >finished 08/2015  - PFTs 12/13/14  FEV1 0.85 (38%) ratio 45  p 5 % improvement from saba  09/20/2015 Med calendar  - 05/08/2016 changed lama to incruse  - 11/12/2017  After extensive coaching HFA effectiveness =   75% (short Ti) - 11/12/17 > changed to trelegy   - 02/07/2021  Flare of cough on anoro >  Try gerd rx  - Prednisone ceiling of 20 and floor of 10 mg daily 02/16/21  - 02/21/2021 changed to  performist bid and yupelri q am    Group D in terms of symptom/risk and laba/lama/ICS  therefore appropriate rx at this point >>>  Change anoro to neb due to cough an refractory sob = yupelri/ performist plus pred 20 mg daily until better then 10 mg daily   Re saba: I spent extra time with pt today reviewing appropriate use of albuterol for prn use on exertion with the following points: 1) saba is for relief of sob that does not improve by walking a slower pace or resting but rather if the pt does not improve after trying this first. 2) If the pt is convinced, as many are, that saba helps recover from activity faster then it's easy to tell if this is the case by re-challenging : ie stop, take the inhaler, then p 5 minutes try the exact same activity (intensity of workload) that just caused the symptoms and see if they are substantially diminished or not after saba 3) if there is an activity that reproducibly causes the symptoms, try the saba 15 min before the activity on alternate days   If in fact the saba really does help, then fine to continue to use it prn but advised may need to look closer at the maintenance regimen being used to achieve better control of  airways disease with exertion.

## 2021-02-21 NOTE — Patient Instructions (Addendum)
Plan A = Automatic = Always= performist is twice daily in nebulizer      yupelri is each am in nebulizer (use anoro until then)   Predisone 10 mg 2 daily until better then one daily   Plan B = Backup (to supplement plan A, not to replace it) Only use your albuterol nebulizer  as a rescue medication to be used if you can't catch your breath by resting or doing a relaxed purse lip breathing pattern.  - The less you use it, the better it will work when you need it. - Ok to use the inhaler up to  every 4 hours if you must but call for appointment if use goes up over your usual need   As needed for cough / congestion > mucinex dm/ flutter valve and hypertonic saline then tylenol #3 if all else fails   Keep previous appt

## 2021-02-22 DIAGNOSIS — Z8701 Personal history of pneumonia (recurrent): Secondary | ICD-10-CM | POA: Diagnosis not present

## 2021-02-22 DIAGNOSIS — Z951 Presence of aortocoronary bypass graft: Secondary | ICD-10-CM | POA: Diagnosis not present

## 2021-02-22 DIAGNOSIS — Z9181 History of falling: Secondary | ICD-10-CM | POA: Diagnosis not present

## 2021-02-22 DIAGNOSIS — K644 Residual hemorrhoidal skin tags: Secondary | ICD-10-CM | POA: Diagnosis not present

## 2021-02-22 DIAGNOSIS — J441 Chronic obstructive pulmonary disease with (acute) exacerbation: Secondary | ICD-10-CM | POA: Diagnosis not present

## 2021-02-22 DIAGNOSIS — F419 Anxiety disorder, unspecified: Secondary | ICD-10-CM | POA: Diagnosis not present

## 2021-02-22 DIAGNOSIS — J9611 Chronic respiratory failure with hypoxia: Secondary | ICD-10-CM | POA: Diagnosis not present

## 2021-02-22 DIAGNOSIS — I2781 Cor pulmonale (chronic): Secondary | ICD-10-CM | POA: Diagnosis not present

## 2021-02-22 DIAGNOSIS — Z9049 Acquired absence of other specified parts of digestive tract: Secondary | ICD-10-CM | POA: Diagnosis not present

## 2021-02-22 DIAGNOSIS — M810 Age-related osteoporosis without current pathological fracture: Secondary | ICD-10-CM | POA: Diagnosis not present

## 2021-02-22 DIAGNOSIS — Z87891 Personal history of nicotine dependence: Secondary | ICD-10-CM | POA: Diagnosis not present

## 2021-02-22 DIAGNOSIS — D649 Anemia, unspecified: Secondary | ICD-10-CM | POA: Diagnosis not present

## 2021-02-22 DIAGNOSIS — Z9981 Dependence on supplemental oxygen: Secondary | ICD-10-CM | POA: Diagnosis not present

## 2021-02-22 DIAGNOSIS — K5790 Diverticulosis of intestine, part unspecified, without perforation or abscess without bleeding: Secondary | ICD-10-CM | POA: Diagnosis not present

## 2021-02-27 DIAGNOSIS — J9611 Chronic respiratory failure with hypoxia: Secondary | ICD-10-CM | POA: Diagnosis not present

## 2021-02-27 DIAGNOSIS — M810 Age-related osteoporosis without current pathological fracture: Secondary | ICD-10-CM | POA: Diagnosis not present

## 2021-02-27 DIAGNOSIS — D649 Anemia, unspecified: Secondary | ICD-10-CM | POA: Diagnosis not present

## 2021-02-27 DIAGNOSIS — I2781 Cor pulmonale (chronic): Secondary | ICD-10-CM | POA: Diagnosis not present

## 2021-02-27 DIAGNOSIS — J441 Chronic obstructive pulmonary disease with (acute) exacerbation: Secondary | ICD-10-CM | POA: Diagnosis not present

## 2021-02-27 DIAGNOSIS — K5790 Diverticulosis of intestine, part unspecified, without perforation or abscess without bleeding: Secondary | ICD-10-CM | POA: Diagnosis not present

## 2021-02-28 ENCOUNTER — Telehealth: Payer: Self-pay | Admitting: Internal Medicine

## 2021-02-28 ENCOUNTER — Telehealth: Payer: Self-pay

## 2021-02-28 NOTE — Telephone Encounter (Signed)
Amy from [facility] was wanting to leave a message in regards to pt; she is still having to take 20 mg of prednisone hoping she would be down to 1 mg by now but she is not and still SOB and she knows her medicine was changed and hasnt taken time to do what it needs to do and wanted to see if she needs a chest x-ray or a CT before her appt next week. Vitals are stable and refuses to take her O2 past 2.5 believes she should be at 3 but wont do so. Brent regard [phone]

## 2021-02-28 NOTE — Telephone Encounter (Signed)
That is fine, approve

## 2021-02-28 NOTE — Telephone Encounter (Signed)
Verbal order given  

## 2021-02-28 NOTE — Telephone Encounter (Signed)
I will see what she needs at the time of the ov but if worse in meantime will need to go to the er for eval  Ok to titrate 02 to sats in low 90s s my micromanaging this.

## 2021-02-28 NOTE — Telephone Encounter (Signed)
MW please advise.  Thanks.  

## 2021-02-28 NOTE — Telephone Encounter (Signed)
I called and spoke with [name]---she is aware of MW recs.  She will pass this along to the pt and pt will keep the appt with MW on 03/28

## 2021-02-28 NOTE — Telephone Encounter (Signed)
Sending you this message since Dr. Redmond School is out of the office. Amy from [facility] called wanting to get verbal orders to continue care for 1 time a week for 5 weeks nurse care stated pts. COPD is getting worse. She can be reached at [phone].

## 2021-03-06 DIAGNOSIS — J9611 Chronic respiratory failure with hypoxia: Secondary | ICD-10-CM | POA: Diagnosis not present

## 2021-03-06 DIAGNOSIS — M810 Age-related osteoporosis without current pathological fracture: Secondary | ICD-10-CM | POA: Diagnosis not present

## 2021-03-06 DIAGNOSIS — D649 Anemia, unspecified: Secondary | ICD-10-CM | POA: Diagnosis not present

## 2021-03-06 DIAGNOSIS — J441 Chronic obstructive pulmonary disease with (acute) exacerbation: Secondary | ICD-10-CM | POA: Diagnosis not present

## 2021-03-06 DIAGNOSIS — K5790 Diverticulosis of intestine, part unspecified, without perforation or abscess without bleeding: Secondary | ICD-10-CM | POA: Diagnosis not present

## 2021-03-06 DIAGNOSIS — I2781 Cor pulmonale (chronic): Secondary | ICD-10-CM | POA: Diagnosis not present

## 2021-03-08 ENCOUNTER — Other Ambulatory Visit: Payer: Self-pay

## 2021-03-08 MED ORDER — ALBUTEROL SULFATE (2.5 MG/3ML) 0.083% IN NEBU: INHALATION_SOLUTION | RESPIRATORY_TRACT | 3 refills | Status: DC

## 2021-03-09 ENCOUNTER — Other Ambulatory Visit: Payer: Medicare Other

## 2021-03-09 ENCOUNTER — Other Ambulatory Visit: Payer: Self-pay

## 2021-03-09 VITALS — BP 138/72 | HR 85 | Temp 97.1°F

## 2021-03-09 DIAGNOSIS — Z515 Encounter for palliative care: Secondary | ICD-10-CM

## 2021-03-09 NOTE — Progress Notes (Signed)
PATIENT NAME: [name] DOB: [birth year] MRN: [id number]  PRIMARY CARE PROVIDER: Denita Lung, MD  RESPONSIBLE PARTY:  Acct ID - Guarantor Home Phone Work Phone Relationship Acct Type  [id number] - [phone]  Self P/F     [address] [name], [place] [address]    PLAN OF CARE and INTERVENTIONS:               1.  GOALS OF CARE/ ADVANCE CARE PLANNING:  Patient desires to remain home and independent.               2.  PATIENT/CAREGIVER EDUCATION:  Discussion on the MOST form               4. PERSONAL EMERGENCY PLAN:  Activate 911 for emergencies.               5.  DISEASE STATUS:    Discussion with patient regarding goals of care.  She would like to remain in her home with the assistance of private caregivers.  She would like to avoid hospitalizations if possible.  Discussed code status and MOST form as daughter will be here for a visit next month.  Advised that Palliative Care NP could follow up with patient and daughter next month if she would like.  Patient will think more on this and let me know if she would like to pursue completing the MOST form.  Home Health is still following patient, likely till the end of this month.    Patient completed z-pak earlier this month due to worsening respiratory status.  She is now on 20 mg of Prednisone daily.  She will follow up with pulmonolgy at the end of the month.  I have encouraged her to ask about a a refill on the z-pak should respiratory status worsen again and she be in need of an antibiotic.   Patient feels she is doing better since being on the prednisone.  She is hopeful to have more answers about her pulmonary issues after her next appointment.   HISTORY OF PRESENT ILLNESS:  87 year old female with COPD.  Patient is being followed by Palliative Care monthly and PRN.  CODE STATUS: Full ADVANCED DIRECTIVES: No MOST FORM: No PPS: 50%   PHYSICAL EXAM:   VITALS: Today's Vitals   03/09/21 1236  BP: 138/72   Pulse: 85  Temp: (!) 97.1 F (36.2 C)  SpO2: 94%  PainSc: 0-No pain    LUNGS: expiratory rhonchi present to bilateral lobes.  Shortness of breath with exertion.  Minimal sputum production clear to lightly tinged yellow.  Currently on Mucinex bid. CARDIAC:  HRR, denies chest pain. EXTREMITIES: No edema present. SKIN: Warm and dry to touch.  No skin breakdown reported. NEURO: Alert and oriented x 4       Lorenza Burton, RN

## 2021-03-13 DIAGNOSIS — K5790 Diverticulosis of intestine, part unspecified, without perforation or abscess without bleeding: Secondary | ICD-10-CM | POA: Diagnosis not present

## 2021-03-13 DIAGNOSIS — J9611 Chronic respiratory failure with hypoxia: Secondary | ICD-10-CM | POA: Diagnosis not present

## 2021-03-13 DIAGNOSIS — D649 Anemia, unspecified: Secondary | ICD-10-CM | POA: Diagnosis not present

## 2021-03-13 DIAGNOSIS — I2781 Cor pulmonale (chronic): Secondary | ICD-10-CM | POA: Diagnosis not present

## 2021-03-13 DIAGNOSIS — J441 Chronic obstructive pulmonary disease with (acute) exacerbation: Secondary | ICD-10-CM | POA: Diagnosis not present

## 2021-03-13 DIAGNOSIS — M810 Age-related osteoporosis without current pathological fracture: Secondary | ICD-10-CM | POA: Diagnosis not present

## 2021-03-15 ENCOUNTER — Ambulatory Visit: Payer: Medicare Other | Admitting: Internal Medicine

## 2021-03-15 ENCOUNTER — Telehealth: Payer: Self-pay | Admitting: Internal Medicine

## 2021-03-15 MED ORDER — AZITHROMYCIN 250 MG PO TABS: 250.0000 mg | ORAL_TABLET | ORAL | 5 refills | Status: DC

## 2021-03-15 NOTE — Telephone Encounter (Signed)
Spoke with Amy, pt's home health nurse  She states the pt's daughter called her this morning asking for zpack to hold for pt to use if needed  She did not have any details on if pt having symptoms now or not  I called and spoke with th ept  She states that her breathing is currently at baseline and not having any purulent mucus, fever  She states just wanted to have apack available to her in case she were to need it  Please advise if okay to send thanks

## 2021-03-15 NOTE — Telephone Encounter (Signed)
LMTCB and will try again tomorrow if no call back.

## 2021-03-15 NOTE — Telephone Encounter (Signed)
Spoke with pt to notify her of Dr. Gustavus Bryant recommendations and that Z-pac had been ordered. PT stated understanding. Nothing further needed at this time.

## 2021-03-15 NOTE — Telephone Encounter (Signed)
LMTCB for the pt  Went ahead and sent in rx

## 2021-03-15 NOTE — Telephone Encounter (Signed)
Fine with me   zpak with 5 refills   Take prn nasty mucus

## 2021-03-16 ENCOUNTER — Telehealth: Payer: Self-pay

## 2021-03-16 NOTE — Telephone Encounter (Signed)
145 pm.  Phone call made to patient to re-schedule our visit for next month.  Patient's daughter [name] will be arriving on 4/5 and will be staying with the patient for 1 week.  Visit has been changed to 4/7 to allow Palliative Care team to meet with daughter in person.

## 2021-03-21 ENCOUNTER — Other Ambulatory Visit: Payer: Self-pay

## 2021-03-21 ENCOUNTER — Encounter: Payer: Self-pay | Admitting: Internal Medicine

## 2021-03-21 ENCOUNTER — Ambulatory Visit (INDEPENDENT_AMBULATORY_CARE_PROVIDER_SITE_OTHER): Payer: Medicare Other | Admitting: Internal Medicine

## 2021-03-21 DIAGNOSIS — J449 Chronic obstructive pulmonary disease, unspecified: Secondary | ICD-10-CM | POA: Diagnosis not present

## 2021-03-21 DIAGNOSIS — J9612 Chronic respiratory failure with hypercapnia: Secondary | ICD-10-CM | POA: Diagnosis not present

## 2021-03-21 MED ORDER — FORMOTEROL FUMARATE 20 MCG/2ML IN NEBU: 20.0000 ug | INHALATION_SOLUTION | Freq: Two times a day (BID) | RESPIRATORY_TRACT | 0 refills | Status: DC

## 2021-03-21 MED ORDER — YUPELRI 175 MCG/3ML IN SOLN: 175.0000 ug | Freq: Every day | RESPIRATORY_TRACT | 11 refills | Status: DC

## 2021-03-21 MED ORDER — FORMOTEROL FUMARATE 20 MCG/2ML IN NEBU: 20.0000 ug | INHALATION_SOLUTION | Freq: Two times a day (BID) | RESPIRATORY_TRACT | 11 refills | Status: DC

## 2021-03-21 MED ORDER — YUPELRI 175 MCG/3ML IN SOLN: 175.0000 ug | Freq: Every day | RESPIRATORY_TRACT | 0 refills | Status: DC

## 2021-03-21 NOTE — Assessment & Plan Note (Signed)
Quit smoking 02/2013   - PFTs 03/24/2012  FEV1  0.91 (43%) ratio 48 and no better p B2,  DLCO 38% corrects to 53%  - changed to perforomist/bud bid 04/2013  -2015 pulmonary rehab  - med calendar 07/17/13 > did not bring 06/28/2014 , .redone 07/26/2014  - 06/28/2014 p extensive coaching HFA effectiveness =    75% (short Ti) - 08/24/14 started IMPACT trial >finished 08/2015  - PFTs 12/13/14  FEV1 0.85 (38%) ratio 45  p 5 % improvement from saba  09/20/2015 Med calendar  - 05/08/2016 changed lama to incruse  - 11/12/2017  After extensive coaching HFA effectiveness =   75% (short Ti) - 11/12/17 > changed to trelegy   - 02/07/2021  Flare of cough on anoro >  Try gerd rx  - Prednisone ceiling of 20 and floor of 10 mg daily 02/16/21  - 02/21/2021 changed to  performist bid and yupelri q am > improved 03/21/2021    Group D in terms of symptom/risk and laba/lama/ICS  therefore appropriate rx at this point >>>  Laba/lama neb plus prednisone ceiling of 20 and floor of 5 mg with saba neb prn

## 2021-03-21 NOTE — Assessment & Plan Note (Signed)
Onset 02 rx 2013 initially just at hs   - ono RA 05/14/12   4 h 33 min < 89%   So ordered 02 2lpm    - HCO3  32 05/10/13    - 01/05/2013   Walked RA x one lap @ 185 stopped due to  88%   - 01/05/2013  Walked 2lpm 2 laps @ 185 ft each stopped due to  90% sob but improved vs baseline   - RA 05/01/2013 sats = 87%  - 05/10/2017 > 02 sats 88% RA > referred for POC eval > preferred continuous flow - 05/14/2019   Walked RA  1 laps @  approx 171ft each @ slow pace  stopped due to sob and desats to 84% corrected on 2lpm and completed lap s desats  rec as of 03/21/2021  2.5 lpm but should  titrate with activity with target > 90%            Each maintenance medication was reviewed in detail including emphasizing most importantly the difference between maintenance and prns and under what circumstances the prns are to be triggered using an action plan format where appropriate.  Total time for H and P, chart review, counseling, reviewing neb device(s) and generating customized AVS unique to this office visit / same day charting = 25 min

## 2021-03-21 NOTE — Patient Instructions (Signed)
Prednisone 10 mg  2 until better then taper to 1/2 daily   Make sure you check your oxygen saturation  at your highest level of activity  to be sure it stays over 90% and adjust  02 flow upward to maintain this level if needed but remember to turn it back to previous settings when you stop (to conserve your supply).   Please schedule a follow up visit in 3 months but call sooner if needed

## 2021-03-21 NOTE — Progress Notes (Signed)
Subjective:    Patient ID: [name]     DOB: 07-29-1932     MRN: [id number]  Brief patient profile:  3  yowf quit smoking 02/2013  referred 02/08/2012 by Glade Lloyd for intermittent sob was on 02 but stopped in early 2013 and with GOLD III COPD documented 03/2012     History of Present Illness  11/09/2016  f/u ov/Keyondra Lagrand re:   GOLD III / maint perforomist/ bud/Incruse/ prn saba neb  Chief Complaint  Patient presents with  . Follow-up    Pt called on 11/05/16 with c/o cough with yellow sputum- zpack and pred taper given. She is much improved and barely coughing at all at this point. Breathing is at her normal baseline.    baseline doe = MMRC3 = can't walk 100 yards even at a slow pace at a flat grade s stopping due to sob  Even on 02  rec For flares of cough/ wheeze/ short of breath assoc with change in mucus as you have in the past  >>> zpak / Prednisone 10 mg take  4 each am x 2 days,   2 each am x 2 days,  1 each am x 2 days and stop     11/12/2017  f/u ov/Tadeusz Stahl re:  GOLD III/ maint  rx - took one cycle zpak /pred since last ov/ using med cal well / 2lpm 24/ 7 continuous, not pulsed Chief Complaint  Patient presents with  . Follow-up    2 liters O2, some sob with activity  MMRC3 = can't walk 100 yards even at a slow pace at a flat grade s stopping due to sob   No need for saba on perf/bud/incruse  rec trelegy     05/14/2019  f/u ov/Tramaine Sauls re: GOLD III spirometry/ 02 dep hs and with activity  Chief Complaint  Patient presents with  . Follow-up    States her breathing has been mostly good. She has good days and bad days. Reports a congested cough. Wants to qualify for innogen.   Dyspnea:  MMRC3 = can't walk 100 yards even at a slow pace at a flat grade s stopping due to sob  - can do mailbox and back flat 50 ft on 02 2lpm  Cough: more rattling x sev days / no color/ not using flutter  Sleeping:  Props up x pillows / bed is flat  SABA use:   02: 2lpm  rec Prednisone 10 mg  take  4 each am x 2 days,   2 each am x 2 days,  1 each am x 2 days and stop  For cough/congestion > mucinex dm up to 1200 mg every 12 hours and use the flutter valve as much as possbile Only use your albuterol as a rescue medication      08/10/2019  f/u ov/Samanvi Cuccia re:  GOLD III   spirometry  But  02 dep 2lpm 24/7  maint on trelegy  Chief Complaint  Patient presents with  . Follow-up    Patient reports that she is doing better. She still has sob and cough. She reports not having to use her rescue inhaler.  Dyspnea:  MMRC3 = can't walk 100 yards even at a slow pace at a flat grade s stopping due to sob   Cough: none   Sleeping: props up on 2 pillows  SABA use: none  02: 2lpm 24/7 does not titrate     08/09/2020  f/u ov/Maksymilian Mabey re: GOLD III spirometry / 02  dep / maint trelegy  Chief Complaint  Patient presents with  . Follow-up    pt wants to talk to about booster samples. pt wants samples for inhaler   Dyspnea:  MMRC3 = can't walk 100 yards even at a slow pace at a flat grade s stopping due to sob  = walking room to room  Cough: not much/ slt rattle  Sleeping: bed is flat/ 2 pillows SABA use: none 02: 2lpm 24/ 7 does not titrate  rec No change rx   02/07/2021  f/u ov/Letesha Klecker re:  aecopd  On anoro  Chief Complaint  Patient presents with  . Follow-up    Increased cough over the past 2 days- prod with clear sputum. She is using her albuterol inhaler once per day on average and neb with albuterol 2 x per day.   Dyspnea:  Room to room / no outdoor walking  Cough: worse x 2 days esp hs and in am x 2 days > only min clear sputum Sleeping: bed is flat with wedge x 45 degrees  SABA use: only used 2 x days  02: 2lpm 24/7  Covid status:   vax x 3  rec For cough/ congestion >  mucinex 1200 mg every 12 hours and flutter valve and tylenol #3 if can't stop up to every 4 hours as needed  For nasty color mucus > zpak Try prilosec otc 20mg   Take 30-60 min before first meal of the day and Pepcid ac  (famotidine) 20 mg one @  bedtime until cough is completely gone for at least a week without the need for cough suppression Prednisone 10 mg take  4 each am x 2 days,   2 each am x 2 days,  1 each am x 2 days and stop      02/21/2021  Acute  ov/Kemal Amores re: anoro daily / zpak finishing  Chief Complaint  Patient presents with  . Acute Visit    Increased SOB and cough x 2 days.   Dyspnea:  Able cross the house on 2.5 lpm = about 50 ft and not checking sats walking or adjusting 02  Cough: rattling / using flutter / mucinex / flutter / hypertonic saline  Sleeping: 45 degrees and resting ok s resp flares noct  SABA use: nebulizer every 4 hours  02: 2.5 24/7 Covid status:   vax x 3 rec Plan A = Automatic = Always= performist is twice daily in nebulizer      yupelri is each am in nebulizer (use anoro until then)  Predisone 10 mg 2 daily until better then one daily  Plan B = Backup (to supplement plan A, not to replace it) Only use your albuterol nebulizer  as a rescue medication  As needed for cough / congestion > mucinex dm/ flutter valve and hypertonic saline then tylenol #3 if all else fails    03/21/2021  f/u ov/Arionne Iams re: performist/ yupelri/pred 10 mg daily  Chief Complaint  Patient presents with  . Follow-up    Breathing has improved since her last visit. She is still coughing-white sputum. She has not had to use her albuterol inhaler or neb.   Dyspnea:  Able to get around house easier  Cough: minimal white3  Sleeping: bed is flat/ 45 degree wedge   SABA use: minimal but neb  02: 2.5 lpm 24/7 does not monitor sats with activity  Covid status:   X 3  vax    No obvious day to day or  daytime variability or assoc excess/ purulent sputum or mucus plugs or hemoptysis or cp or chest tightness, subjective wheeze or overt sinus or hb symptoms.   Sleeping as above without nocturnal  or early am exacerbation  of respiratory  c/o's or need for noct saba. Also denies any obvious fluctuation of  symptoms with weather or environmental changes or other aggravating or alleviating factors except as outlined above   No unusual exposure hx or h/o childhood pna/ asthma or knowledge of premature birth.  Current Allergies, Complete Past Medical History, Past Surgical History, Family History, and Social History were reviewed in [employer] record.  ROS  The following are not active complaints unless bolded Hoarseness, sore throat, dysphagia, dental problems, itching, sneezing,  nasal congestion or discharge of excess mucus or purulent secretions, ear ache,   fever, chills, sweats, unintended wt loss or wt gain, classically pleuritic or exertional cp,  orthopnea pnd or arm/hand swelling  or leg swelling, presyncope, palpitations, abdominal pain, anorexia, nausea, vomiting, diarrhea  or change in bowel habits or change in bladder habits, change in stools or change in urine, dysuria, hematuria,  rash, arthralgias, visual complaints, headache, numbness, weakness or ataxia or problems with walking or coordination,  change in mood or  memory.        Current Meds  Medication Sig  . acetaminophen (TYLENOL) 650 MG CR tablet Take 650 mg by mouth every 8 (eight) hours as needed for pain.  Marland Kitchen albuterol (PROVENTIL) (2.5 MG/3ML) 0.083% nebulizer solution USE 1 VIAL VIA NEBULIZER EVERY 6 HOURS AS NEEDED FOR WHEEZING OR SHORTNESS OF BREATH DX: J44.9  . albuterol (VENTOLIN HFA) 108 (90 Base) MCG/ACT inhaler Inhale 2 puffs into the lungs every 4 (four) hours as needed for wheezing or shortness of breath (((PLAN A))). Reported on 01/12/2016  . ALPRAZolam (XANAX) 0.25 MG tablet Take 0.5 tablets (0.125 mg total) by mouth daily as needed for up to 10 doses for anxiety.  . brimonidine (ALPHAGAN) 0.2 % ophthalmic solution Place 1 drop into the right eye 3 (three) times daily.   . Cholecalciferol (VITAMIN D) 50 MCG (2000 UT) CAPS Take 1 capsule by mouth daily.  Marland Kitchen dextromethorphan-guaiFENesin (MUCINEX  DM) 30-600 MG per 12 hr tablet Take 1 tablet by mouth at bedtime as needed (with flutter for cough, congestion, and thick mucus).   . formoterol (PERFOROMIST) 20 MCG/2ML nebulizer solution Take 2 mLs (20 mcg total) by nebulization 2 (two) times daily.  . OXYGEN Use 2L of O2 continuously  . predniSONE (DELTASONE) 10 MG tablet Take 2 each AM until better then take 1 each AM until seen back at office  . revefenacin (YUPELRI) 175 MCG/3ML nebulizer solution Take 3 mLs (175 mcg total) by nebulization daily.              Objective:   Physical Exam    03/21/2021    135  02/21/2021      131 02/07/2021    133 08/09/2020    135  08/10/2019    137  05/14/2019  140  Wt 97 02/08/2012 > 03/24/2012  99 > 103 05/09/2012 > 06/12/2012  99 >   09/24/2012  96 > 98 01/05/2013 >  106 05/01/2013 > 05/05/2013  99 > 05/15/2013  104 > 06/01/2013 115 > 115 07/07/2013 > 114  07/17/13 > 127 09/18/2013 > 12/01/2013  131 >135 03/04/2014 > 03/12/2014  133 > 05/27/2014  133  > 06/28/2014  133 >131 07/26/2014 >133 09/21/2014 139 >  04/14/2015  138  >  05/08/2016  146 > 05/10/2017    145 >  11/12/2017  134 >  11/03/2018   144 >    Vital signs reviewed  03/21/2021  - Note at rest 02 sats  91% on 2lpm pulsed   General appearance:    W/c bound elderly wf nad     HEENT : pt wearing mask not removed for exam due to covid -19 concerns.    NECK :  without JVD/Nodes/TM/ nl carotid upstrokes bilaterally   LUNGS: no acc muscle use,  Mod barrel  contour chest wall with bilateral  Distant bs s audible wheeze and  without cough on insp or exp maneuvers and mod  Hyperresonant  to  percussion bilaterally     CV:  RRR  no s3 or murmur or increase in P2, and no edema   ABD:  soft and nontender with pos mid insp Hoover's  in the supine position. No bruits or organomegaly appreciated, bowel sounds nl  MS:     ext warm without deformities, calf tenderness, cyanosis or clubbing No obvious joint restrictions   SKIN: warm and dry without lesions    NEURO:  alert,  approp, nl sensorium with  no motor or cerebellar deficits apparent.              Assessment & Plan:

## 2021-03-22 DIAGNOSIS — K5790 Diverticulosis of intestine, part unspecified, without perforation or abscess without bleeding: Secondary | ICD-10-CM | POA: Diagnosis not present

## 2021-03-22 DIAGNOSIS — J9611 Chronic respiratory failure with hypoxia: Secondary | ICD-10-CM | POA: Diagnosis not present

## 2021-03-22 DIAGNOSIS — J441 Chronic obstructive pulmonary disease with (acute) exacerbation: Secondary | ICD-10-CM | POA: Diagnosis not present

## 2021-03-22 DIAGNOSIS — I2781 Cor pulmonale (chronic): Secondary | ICD-10-CM | POA: Diagnosis not present

## 2021-03-22 DIAGNOSIS — M810 Age-related osteoporosis without current pathological fracture: Secondary | ICD-10-CM | POA: Diagnosis not present

## 2021-03-22 DIAGNOSIS — D649 Anemia, unspecified: Secondary | ICD-10-CM | POA: Diagnosis not present

## 2021-03-24 ENCOUNTER — Telehealth: Payer: Self-pay | Admitting: Family Medicine

## 2021-03-24 ENCOUNTER — Telehealth: Payer: Self-pay | Admitting: Internal Medicine

## 2021-03-24 DIAGNOSIS — D649 Anemia, unspecified: Secondary | ICD-10-CM | POA: Diagnosis not present

## 2021-03-24 DIAGNOSIS — M810 Age-related osteoporosis without current pathological fracture: Secondary | ICD-10-CM | POA: Diagnosis not present

## 2021-03-24 DIAGNOSIS — J441 Chronic obstructive pulmonary disease with (acute) exacerbation: Secondary | ICD-10-CM | POA: Diagnosis not present

## 2021-03-24 DIAGNOSIS — F419 Anxiety disorder, unspecified: Secondary | ICD-10-CM | POA: Diagnosis not present

## 2021-03-24 DIAGNOSIS — Z87891 Personal history of nicotine dependence: Secondary | ICD-10-CM | POA: Diagnosis not present

## 2021-03-24 DIAGNOSIS — K5790 Diverticulosis of intestine, part unspecified, without perforation or abscess without bleeding: Secondary | ICD-10-CM | POA: Diagnosis not present

## 2021-03-24 DIAGNOSIS — Z9181 History of falling: Secondary | ICD-10-CM | POA: Diagnosis not present

## 2021-03-24 DIAGNOSIS — I2781 Cor pulmonale (chronic): Secondary | ICD-10-CM | POA: Diagnosis not present

## 2021-03-24 DIAGNOSIS — K644 Residual hemorrhoidal skin tags: Secondary | ICD-10-CM | POA: Diagnosis not present

## 2021-03-24 DIAGNOSIS — Z951 Presence of aortocoronary bypass graft: Secondary | ICD-10-CM | POA: Diagnosis not present

## 2021-03-24 DIAGNOSIS — Z9981 Dependence on supplemental oxygen: Secondary | ICD-10-CM | POA: Diagnosis not present

## 2021-03-24 DIAGNOSIS — Z8701 Personal history of pneumonia (recurrent): Secondary | ICD-10-CM | POA: Diagnosis not present

## 2021-03-24 DIAGNOSIS — Z9049 Acquired absence of other specified parts of digestive tract: Secondary | ICD-10-CM | POA: Diagnosis not present

## 2021-03-24 DIAGNOSIS — J9611 Chronic respiratory failure with hypoxia: Secondary | ICD-10-CM | POA: Diagnosis not present

## 2021-03-24 NOTE — Telephone Encounter (Signed)
Amy was advise . [place]

## 2021-03-24 NOTE — Telephone Encounter (Signed)
ok 

## 2021-03-24 NOTE — Telephone Encounter (Signed)
Amy with [facility] wants to get the ok from you to extend pts visit for 5 more weeks 1 time a week. She said that pt is getting better but not to where she wants her to be. Amy said she will let her finish up with palliative care after the 5 weeks. Amy can be reached at [phone]

## 2021-03-29 DIAGNOSIS — J9611 Chronic respiratory failure with hypoxia: Secondary | ICD-10-CM | POA: Diagnosis not present

## 2021-03-29 DIAGNOSIS — D649 Anemia, unspecified: Secondary | ICD-10-CM | POA: Diagnosis not present

## 2021-03-29 DIAGNOSIS — K5790 Diverticulosis of intestine, part unspecified, without perforation or abscess without bleeding: Secondary | ICD-10-CM | POA: Diagnosis not present

## 2021-03-29 DIAGNOSIS — J441 Chronic obstructive pulmonary disease with (acute) exacerbation: Secondary | ICD-10-CM | POA: Diagnosis not present

## 2021-03-29 DIAGNOSIS — I2781 Cor pulmonale (chronic): Secondary | ICD-10-CM | POA: Diagnosis not present

## 2021-03-29 DIAGNOSIS — M810 Age-related osteoporosis without current pathological fracture: Secondary | ICD-10-CM | POA: Diagnosis not present

## 2021-03-30 ENCOUNTER — Other Ambulatory Visit: Payer: Self-pay

## 2021-03-30 ENCOUNTER — Other Ambulatory Visit: Payer: Medicare Other

## 2021-03-30 VITALS — BP 130/80 | HR 77 | Temp 97.3°F

## 2021-03-30 DIAGNOSIS — Z515 Encounter for palliative care: Secondary | ICD-10-CM

## 2021-03-30 NOTE — Telephone Encounter (Signed)
Nothing noted in message. Will close encounter.  

## 2021-03-30 NOTE — Progress Notes (Signed)
COMMUNITY PALLIATIVE CARE SW NOTE  PATIENT NAME: [name] DOB: 26-Jun-1932 MRN: [id number]  PRIMARY CARE PROVIDER: Denita Lung, MD  RESPONSIBLE PARTY:  Acct ID - Guarantor Home Phone Work Phone Relationship Acct Type  [id number] - Setter,[id number]  Self P/F     [place], [name], [place] [address]     PLAN OF CARE and INTERVENTIONS:             1. GOALS OF CARE/ ADVANCE CARE PLANNING:  Patient is a Full code, but wishes to be DNR. Daughter is HCPOA. Patients goal is to remain in her home as independent as possible.   2.         SOCIAL/EMOTIONAL/SPIRITUAL ASSESSMENT/ INTERVENTIONS:  SW and RN Almyra Free met with patient in patients home for monthly palliative care visit. Patient resides in one level home. Daughter, [name], visiting in town and present for visit.  Patient updated team on current medical status. Patient has started Zpack and prednisone. Patient still on 2LPM continuously. No other medication changes. Patient eating well. Patient reports no pain at this time. Patient states that she does not sleep well at HS but does nap during the day.   RN reviewed meds and vitals. [facility] RN still visiting but will ending visits soon. Patient aware that palliative care will continue to follow. Patient continues to have a issues with breathing.  Advance care planning discussed at length with patient and daughter, [name]. MOST form reviewed. Patient has chosen to be a DNR. Palliative care NP to follow up with patient and daughter.   SW discussed goals, reviewed care plan, provided emotional support, used active and reflective listening. Palliative care will continue to monitor and assist with long term care planning as needed.   3.         PATIENT/CAREGIVER EDUCATION/ COPING:  Patient A&O. Patient able to answer and address all questions appropriately.  Daughter is supportive.  4.         PERSONAL EMERGENCY PLAN:  Patient/family will call 9-1-1 for emergencies. Patient has  life alert system.  5.         COMMUNITY RESOURCES COORDINATION/ HEALTH CARE NAVIGATION:  Patient manages all care. [facility] provide RN. Patient has house cleaner Q Fri and caregiver 2x/wk for grocery shopping and other needs.  6.         FINANCIAL/LEGAL CONCERNS/INTERVENTIONS:  None     SOCIAL HX:  Social History   Tobacco Use  . Smoking status: Former Smoker    Packs/day: 0.30    Years: 50.00    Pack years: 15.00    Types: Cigarettes    Quit date: 03/05/2013    Years since quitting: 8.0  . Smokeless tobacco: Never Used  Substance Use Topics  . Alcohol use: No    CODE STATUS: Full code ADVANCED DIRECTIVES: Y MOST FORM COMPLETE:  reviewed HOSPICE EDUCATION PROVIDED: N  OTL:XBWIOMB is independent with all ADLS. Patient is ambulatory w/o AD but has RW if needed. Patient able to prep her own meals.   Time spent: 33mn      [place]

## 2021-03-30 NOTE — Progress Notes (Signed)
PATIENT NAME: [name] DOB: [birth year] MRN: [id number]  PRIMARY CARE PROVIDER: Denita Lung, MD  RESPONSIBLE PARTY:  Acct ID - Guarantor Home Phone Work Phone Relationship Acct Type  [id number] - Ferrera,[phone]  Self P/F     [place], [name], [place] [address]    PLAN OF CARE and INTERVENTIONS:               1.  GOALS OF CARE/ ADVANCE CARE PLANNING:  Discussion completed on Advance Planning.                2.  PATIENT/CAREGIVER EDUCATION:  Further discussion on code status               4. PERSONAL EMERGENCY PLAN:  Remain home and comfortable.               5.  DISEASE STATUS:  Joint visit completed with [name], SW, [name] and patient.  [name] shared patient was recently seen by pulmonary and patient is maxed out on therapy options for COPD.  Patient has started on a z-pak due to yellow sputum production and absent breath sounds on [facility] visit yesterday.   Patient denies any worsening respiratory systems. Patient has 6 refills for her z-pak  She is compliant with her medication regimen and daughter feels changing inhalers to nebulizers where the best option.    Began discussion regarding Advance Planning and patient was resistant to discuss initially but with her daughter's assistance she was able to voice her desires and goals of care.  Patient does not wish to be resuscitated,  No further hospitalizations, continue use of antibiotics as needed, IV hydration on a trial basis and no tube feeding.  MOST form is left in the home for further review and Palliative Care NP will outreach patient/daughter to review.  Daughter has been proactive on setting up the home for patient and also has resources available should patient require more assistance in her home.  At this time, patient does not feel she needs additional help.  Daughter is requesting if additional assistance be needed that she be notified.  Patient continues to have issues with insomnia.  She reports  napping 30 minutes to 1 hour during the day.    Patient is ambulatory without assistive devices.  She denies any falls in the last month.  She continues to cook for herself and complete personal care.  Assistance is in the home to complete household chores, grocery shopping and transporting patient to [facility] appointments.   Patient will continue with home health for one more month.  Palliative Care Services explained again and daughter asked if patient began having symptoms could I see patient prior to her 1 month visit.  Advised that PRN visits could be made.  I have left business cards for both patient and daughter with my #.   HISTORY OF PRESENT ILLNESS:  87 year old female with COPD.  Patient is being followed by Palliative Care monthly and PRN.  CODE STATUS: Desires DNR ADVANCED DIRECTIVES: Yes MOST FORM: Reviewed  PPS: 50%   PHYSICAL EXAM:   VITALS: Today's Vitals   03/30/21 1249  BP: 130/80  Pulse: 77  Temp: (!) 97.3 F (36.3 C)  SpO2: 98%  PainSc: 0-No pain    LUNGS: Diminished breath sounds bilaterally, rhonchi present to middle/lower lobes.  + sputum yellow in appearace CARDIAC: HRR EXTREMITIES: - for edema SKIN: Warm and dry to touch NEURO: Alert and oriented x 3  Lorenza Burton, RN

## 2021-04-02 ENCOUNTER — Other Ambulatory Visit: Payer: Self-pay | Admitting: Internal Medicine

## 2021-04-03 ENCOUNTER — Other Ambulatory Visit: Payer: Medicare Other | Admitting: Hospice

## 2021-04-03 ENCOUNTER — Other Ambulatory Visit: Payer: Self-pay

## 2021-04-03 DIAGNOSIS — Z515 Encounter for palliative care: Secondary | ICD-10-CM

## 2021-04-03 NOTE — Progress Notes (Signed)
    [facility] Palliative Care Consult Note Telephone: [phone]  Fax: [phone]    Date of encounter: 04/03/21 PATIENT NAME: [name] [address] [place] [address]   [phone] (home)  DOB: 1932-06-18 MRN: [id number] PRIMARY CARE PROVIDER:    Denita Lung, MD,  [address] [place] [address] [phone]  REFERRING PROVIDER:   Denita Booker, [address] [place],  [place] [address] [phone]  RESPONSIBLE PARTY:   [name] is [place]    Name Relation Home Work Mobile   Winter,[name] Daughter   [phone]   [name] Sister   [phone]     TELEHEALTH VISIT STATEMENT Due to the COVID-19 crisis, this visit was done via telephone from my office. It was initiated and consented to by this patient and/or family.     Palliative Care was asked to follow this patient by consultation request of  Debra Lung, MD/[name] [facility] Palliative care Social Worker to address advance care planning. [name] was present with patient during visit.   ASSESSMENT AND PLAN / RECOMMENDATIONS:   Advance Care Planning/Goals of Care: Goals include to maximize quality of life and symptom management. Our advance care planning conversation included a discussion about:     The value and importance of advance care planning   Exploration of goals of care in the event of a sudden injury or illness   Identification and preparation of a healthcare agent   Review and updating or creation of an  advance directive document .  Decision not to resuscitate or to de-escalate disease focused treatments due to poor prognosis. CODE STATUS: Patient affirmed she is a DO NOT RESUSCITATE.  NP signed DNR form for patient.  Same document uploaded to epic today.    MOST form selections include comfort measures only, antibiotics as indicated, IV fluids for a defined trial period,  No feeding tube.  [name] will  coordinate with patient/[name] for a copy at home.  I spent 46 minutes providing this initial consultation; time includes time spent with patient/family, chart review, and documentation. More than 50% of the time in this consultation was spent on counseling andoordinating communication  Thank you for the opportunity to participate in the care of [name].  The palliative care team will continue to follow. Please call our office at [phone] if we can be of additional assistance.   Debra Spray, NP

## 2021-04-05 DIAGNOSIS — J441 Chronic obstructive pulmonary disease with (acute) exacerbation: Secondary | ICD-10-CM | POA: Diagnosis not present

## 2021-04-05 DIAGNOSIS — K5790 Diverticulosis of intestine, part unspecified, without perforation or abscess without bleeding: Secondary | ICD-10-CM | POA: Diagnosis not present

## 2021-04-05 DIAGNOSIS — J9611 Chronic respiratory failure with hypoxia: Secondary | ICD-10-CM | POA: Diagnosis not present

## 2021-04-05 DIAGNOSIS — I2781 Cor pulmonale (chronic): Secondary | ICD-10-CM | POA: Diagnosis not present

## 2021-04-05 DIAGNOSIS — D649 Anemia, unspecified: Secondary | ICD-10-CM | POA: Diagnosis not present

## 2021-04-05 DIAGNOSIS — M810 Age-related osteoporosis without current pathological fracture: Secondary | ICD-10-CM | POA: Diagnosis not present

## 2021-04-11 DIAGNOSIS — K5790 Diverticulosis of intestine, part unspecified, without perforation or abscess without bleeding: Secondary | ICD-10-CM | POA: Diagnosis not present

## 2021-04-11 DIAGNOSIS — M810 Age-related osteoporosis without current pathological fracture: Secondary | ICD-10-CM | POA: Diagnosis not present

## 2021-04-11 DIAGNOSIS — D649 Anemia, unspecified: Secondary | ICD-10-CM | POA: Diagnosis not present

## 2021-04-11 DIAGNOSIS — J9611 Chronic respiratory failure with hypoxia: Secondary | ICD-10-CM | POA: Diagnosis not present

## 2021-04-11 DIAGNOSIS — I2781 Cor pulmonale (chronic): Secondary | ICD-10-CM | POA: Diagnosis not present

## 2021-04-11 DIAGNOSIS — J441 Chronic obstructive pulmonary disease with (acute) exacerbation: Secondary | ICD-10-CM | POA: Diagnosis not present

## 2021-04-19 DIAGNOSIS — M810 Age-related osteoporosis without current pathological fracture: Secondary | ICD-10-CM | POA: Diagnosis not present

## 2021-04-19 DIAGNOSIS — J9611 Chronic respiratory failure with hypoxia: Secondary | ICD-10-CM | POA: Diagnosis not present

## 2021-04-19 DIAGNOSIS — D649 Anemia, unspecified: Secondary | ICD-10-CM | POA: Diagnosis not present

## 2021-04-19 DIAGNOSIS — J441 Chronic obstructive pulmonary disease with (acute) exacerbation: Secondary | ICD-10-CM | POA: Diagnosis not present

## 2021-04-19 DIAGNOSIS — I2781 Cor pulmonale (chronic): Secondary | ICD-10-CM | POA: Diagnosis not present

## 2021-04-19 DIAGNOSIS — K5790 Diverticulosis of intestine, part unspecified, without perforation or abscess without bleeding: Secondary | ICD-10-CM | POA: Diagnosis not present

## 2021-05-02 ENCOUNTER — Other Ambulatory Visit: Payer: Self-pay

## 2021-05-02 ENCOUNTER — Ambulatory Visit (INDEPENDENT_AMBULATORY_CARE_PROVIDER_SITE_OTHER): Payer: Medicare Other | Admitting: Podiatry

## 2021-05-02 ENCOUNTER — Encounter: Payer: Self-pay | Admitting: Podiatry

## 2021-05-02 DIAGNOSIS — N289 Disorder of kidney and ureter, unspecified: Secondary | ICD-10-CM

## 2021-05-02 DIAGNOSIS — M79676 Pain in unspecified toe(s): Secondary | ICD-10-CM

## 2021-05-02 DIAGNOSIS — B351 Tinea unguium: Secondary | ICD-10-CM

## 2021-05-02 NOTE — Progress Notes (Signed)
This patient returns to my office for at risk foot care.  This patient requires this care by a professional since this patient will be at risk due to having  renal insufficiency.  This patient is unable to cut nails herself since the patient cannot reach her nails.These nails are painful walking and wearing shoes.  This patient presents for at risk foot care today.    General Appearance  Alert, conversant and in no acute stress.  Vascular  Dorsalis pedis and posterior tibial  pulses are weakly  palpable  bilaterally.  Capillary return is within normal limits  Bilaterally. Cold feet. Bilaterally.  Absent digital hair  B/L.  Neurologic  Senn-Weinstein monofilament wire test diminished   bilaterally. Muscle power within normal limits bilaterally.  Nails Thick disfigured discolored nails with subungual debris  from hallux to fifth toes bilaterally. No evidence of bacterial infection or drainage bilaterally.  Orthopedic  No limitations of motion  feet .  No crepitus or effusions noted.  No bony pathology or digital deformities noted.  Skin  normotropic skin with no porokeratosis noted bilaterally.  No signs of infections or ulcers noted.     Onychomycosis  Pain in right toes  Pain in left toes  Consent was obtained for treatment procedures.   Mechanical debridement of nails 1-5  bilaterally performed with a nail nipper.  Filed with dremel without incident.    Return office visit   3 months                   Told patient to return for periodic foot care and evaluation due to potential at risk complications.   Gardiner Barefoot DPM

## 2021-05-04 ENCOUNTER — Other Ambulatory Visit: Payer: Self-pay

## 2021-05-04 ENCOUNTER — Other Ambulatory Visit: Payer: Medicare Other

## 2021-05-04 VITALS — BP 124/78 | HR 81 | Temp 97.1°F | Resp 22

## 2021-05-04 DIAGNOSIS — Z515 Encounter for palliative care: Secondary | ICD-10-CM

## 2021-05-04 NOTE — Progress Notes (Signed)
COMMUNITY PALLIATIVE CARE SW NOTE  PATIENT NAME: [name] DOB: 02/08/1932 MRN: [id number]  PRIMARY CARE PROVIDER: Denita Lung, MD  RESPONSIBLE PARTY:  Acct ID - Guarantor Home Phone Work Phone Relationship Acct Type  [id number] - Wag[place],[phone]  Self P/F     [place], [name], [place] [address]     PLAN OF CARE and INTERVENTIONS:             1. GOALS OF CARE/ ADVANCE CARE PLANNING:  Patient is a DNR. MOST form completed. Daughter is HCPOA. Patients goal is to remain in her home as independent as possible.    2.         SOCIAL/EMOTIONAL/SPIRITUAL ASSESSMENT/ INTERVENTIONS:  SW and RN Almyra Free met with patient in patients home for monthly palliative care visit. Patient resides in one level home alone.   Patient updated team on current medical status. Patient no longer taking Zpack. Patient still on 2.5LPM continuously. No other medication changes, continues on prednisone 91m. Patient eating well. Patient reports no pain at this time. Patient states that she does not sleep well at HS but does nap during the day. Patient's appetite is good and continues to have private care giver come in to assist with light housekeeping and grocery shopping.   RN reviewed meds and vitals. Patient continues to have an issues with breathing, however nothing abnormal. Patient shares that she does cough at times but is not coughing anything up. Patient scheduled to see pulmonology next month and daughter will be in town for visit as well.     SW discussed goals, reviewed care plan, provided emotional support, used active and reflective listening. Palliative care will continue to monitor and assist with long term care planning as needed.    3.         PATIENT/CAREGIVER EDUCATION/ COPING:  Patient A&O. Patient able to answer and address all questions appropriately.  Daughter is supportive.   4.         PERSONAL EMERGENCY PLAN:  Patient/family will call 9-1-1 for emergencies. Patient has life  alert system.   5.         COMMUNITY RESOURCES COORDINATION/ HEALTH CARE NAVIGATION:  Patient manages all care. Patient has house cleaner Q Fri and caregiver 2x/wk for grocery shopping and other needs.   6.         FINANCIAL/LEGAL CONCERNS/INTERVENTIONS:  None      SOCIAL HX:  Social History   Tobacco Use  . Smoking status: Former Smoker    Packs/day: 0.30    Years: 50.00    Pack years: 15.00    Types: Cigarettes    Quit date: 03/05/2013    Years since quitting: 8.1  . Smokeless tobacco: Never Used  Substance Use Topics  . Alcohol use: No    CODE STATUS: DNR  ADVANCED DIRECTIVES: Y MOST FORM COMPLETE:  Y HOSPICE EDUCATION PROVIDED: N  PPS: Patient is independent with all ADL's.   Time spent: 45 min       [place]

## 2021-05-04 NOTE — Progress Notes (Signed)
PATIENT NAME: [name] DOB: [birth year] MRN: [id number]  PRIMARY CARE PROVIDER: Denita Lung, MD  RESPONSIBLE PARTY:  Acct ID - Guarantor Home Phone Work Phone Relationship Acct Type  [id number] - [name],[phone]  Self P/F     [name], [name], [name] [address]    PLAN OF CARE and INTERVENTIONS:               1.  GOALS OF CARE/ ADVANCE CARE PLANNING:  Remain home with assistance.  Desires no further hospitalizations and to remain home and independent for as long as possible.               2.  PATIENT/CAREGIVER EDUCATION:  Follow up as scheduled with pulmonology.  Continue current medication regimen taking z-pak for worsening respiratory symptoms and change in sputum production.               4. PERSONAL EMERGENCY PLAN:  Activate 911 for emergencies.                5.  DISEASE STATUS:  Joint visit with [name], SW and patient.   Patient feels she has been doing very well over the last month.  She is down to 10 mg of prednisone daily.  She has not required a z-pak since my last visit. Re-enforced reasons to take z-pak with patient.   Patient confirms that she is continues with current medication regimen and no new medication changes are noted other than the decrease in prednisone to 10 mg daily.  She has not required any albuterol for shortness of breath or wheezes.  Patient continues to ambulate independently in her home.  No issues with falls and gait is steady.  Patient has housekeeping in 1x a week and another person gets groceries for her weekly.  She continues to due light cooking for herself and denies any weight loss.  Home Health completed services at the end of last month.  Patient will follow up with pulmonology next month when her daughter is in-town.   Visit scheduled for next month and patient is encouraged to call should daughter wish to meet with the team when she arrives.   Overall, patient feels she has been doing well over the last month.  No new issues or  concerns voiced.     HISTORY OF PRESENT ILLNESS:  87 year old female with COPD.  Patient is being followed by Palliative Care monthly and PRN.  CODE STATUS: DNR ADVANCED DIRECTIVES: No MOST FORM: Yes PPS: 50%   PHYSICAL EXAM:   VITALS: Today's Vitals   05/04/21 1318  BP: 124/78  Pulse: 81  Resp: (!) 22  Temp: (!) 97.1 F (36.2 C)  SpO2: 98%  PainSc: 0-No pain    LUNGS: Diminished bilaterally, no wheezes present.  Purseed lip breathing with exertion.  CARDIAC: HRR EXTREMITIES: - edema SKIN: warm and dry to touch, no skin breakdown present.  NEURO: alert and oriented x 4       Debra Burton, RN

## 2021-05-08 DIAGNOSIS — Z961 Presence of intraocular lens: Secondary | ICD-10-CM | POA: Diagnosis not present

## 2021-05-08 DIAGNOSIS — H401111 Primary open-angle glaucoma, right eye, mild stage: Secondary | ICD-10-CM | POA: Diagnosis not present

## 2021-05-22 ENCOUNTER — Other Ambulatory Visit: Payer: Self-pay | Admitting: Internal Medicine

## 2021-05-29 ENCOUNTER — Telehealth: Payer: Self-pay

## 2021-05-29 NOTE — Telephone Encounter (Signed)
1007 am.  Message received after-hours on Friday at 534 pm from patient.  Return call made this am.  Patient states her nebulizer machine stopped working on Friday.  She has a back-up nebulizer machine that she is currently using.  Patient's friend currently the broken nebulizer and lives close to [employer].  She will have this looked and replaced if needed.   1015 am.  Phone call made to [employer] to confirm replacement.  Patient is able to take this to any [employer] retail store to have the technician trouble-shoot or replace.  1017 am.  Phone call made to patient to advise of above.  Patient states she is concerned about what she should do if her back-up machine breaks.  I advised [employer] is able to replace the machine if it is brought into the store.  She does have a rescue inhaler if needed.  Patient will follow up with pulmonology to see if anything further needs to be set up should both nebulizer machines fail and patient is unable to get to the retail store for replacement.

## 2021-06-13 ENCOUNTER — Other Ambulatory Visit: Payer: Medicare Other

## 2021-06-13 ENCOUNTER — Ambulatory Visit: Payer: Medicare Other | Admitting: Internal Medicine

## 2021-06-13 ENCOUNTER — Other Ambulatory Visit: Payer: Self-pay

## 2021-06-13 VITALS — BP 132/80 | HR 94 | Temp 97.2°F | Resp 22 | Wt 144.0 lb

## 2021-06-13 DIAGNOSIS — Z23 Encounter for immunization: Secondary | ICD-10-CM | POA: Diagnosis not present

## 2021-06-13 DIAGNOSIS — Z515 Encounter for palliative care: Secondary | ICD-10-CM

## 2021-06-13 NOTE — Progress Notes (Signed)
PATIENT NAME: SHONTELL PROSSER DOB: 1932-09-29 MRN: [id number]  PRIMARY CARE PROVIDER: Denita Lung, MD  RESPONSIBLE PARTY:  Acct ID - Guarantor Home Phone Work Phone Relationship Acct Type  [id number] - [phone]  Self P/F     [place] [place], [name], [place] [address]    PLAN OF CARE and INTERVENTIONS:               1.  GOALS OF CARE/ ADVANCE CARE PLANNING:                2.  PATIENT/CAREGIVER EDUCATION:  Use of flutter valve and incentive spirometer.               4. PERSONAL EMERGENCY PLAN:  Activate 911 for emergencies.               5.  DISEASE STATUS:  Joint visit completed with [name], SW, daughter [name] and patient.  Patient is found in her kitchen with her daughter.  Patient states she has been doing well since our last visit.  She has not required any additional antibiotics since April.  Patient continues with shortness of breath with minimal exertion.  She notes a productive cough at times with clear sputum production.  Patient has not been using her flutter valve or incentive spirometer and daughter is working on getting patient back on schedule with this routine.  Patient feels overall her current medication regimen has been working well.  No refills are needed at this time.   Patient is using a wheelchair when she is outside of the home.  She does not use any assistive devices when in the home.  No falls are reported.  Daughter notes patient is napping at least 3x a day.  This was her routine prior to having end-stage COPD.  Patient is currently on prednisone 10 mg daily.  She notes her appetite has increased and she is snacking more.  Weight obtained and patient is 9 lb weight increase.  Trace edema present to bilateral feet.   Patient continues to cook meals for herself.  She has 2 private caregivers coming in 2-3 x weekly.  Daughter notes if needed caregivers could be increased.   Patient will have a follow up visit with pulmonology on July 5th.  Palliative  Care will see patient on July 13.     HISTORY OF PRESENT ILLNESS:  87 year old female with COPD.  Patient is being followed by Palliative Care monthly and PRN.  CODE STATUS: DNR ADVANCED DIRECTIVES: No MOST FORM: Yes PPS: 50%   PHYSICAL EXAM:   VITALS: Today's Vitals   06/13/21 1202  BP: 132/80  Pulse: 94  Resp: (!) 22  Temp: (!) 97.2 F (36.2 C)  SpO2: 94%  PainSc: 0-No pain    LUNGS: positive findings: rhonchi left mid posterior CARDIAC: Cor RRR}  EXTREMITIES: Trace edema SKIN: Skin color, texture, turgor normal. No rashes or lesions or normal NEURO: positive for gait problems       Lorenza Burton, RN

## 2021-06-13 NOTE — Progress Notes (Signed)
COMMUNITY PALLIATIVE CARE SW NOTE  PATIENT NAME: [name] DOB: May 13, 1932 MRN: [id number]  PRIMARY CARE PROVIDER: Denita Lung, MD  RESPONSIBLE PARTY:  Acct ID - Guarantor Home Phone Work Phone Relationship Acct Type  [id number] - [phone]  Self P/F     [address], [name], [place] [address]     PLAN OF CARE and INTERVENTIONS:             GOALS OF CARE/ ADVANCE CARE PLANNING:  Patient is a DNR. MOST form completed. Daughter is HCPOA. Patients goal is to remain in her home as independent as possible.   2.         SOCIAL/EMOTIONAL/SPIRITUAL ASSESSMENT/ INTERVENTIONS:  SW and RN Almyra Free met with patient in patients home for monthly palliative care visit. Patient resides in one level home alone.   Patient and daughter updated team on current medical status. Patient continues on on 2.5LPM continuously. No other medication changes, continues on prednisone 38m. Patient eating well with increased weight of 144lb. Patient reports no pain at this time. Patient states that she does not sleep well at HS but does nap during the day. Patient's continues to have private care giver come in to assist with light housekeeping and grocery shopping.   RN reviewed meds and vitals. Patient continues to have an issues with breathing, more during exertion. Patient shares that she does cough at times but is not coughing anything up.     Palliative care will continue to monitor and assist with long term care planning as needed.   3.         PATIENT/CAREGIVER EDUCATION/ COPING:  Patient A&O. Patient able to answer and address all questions appropriately.  Daughter is supportive.   4.         PERSONAL EMERGENCY PLAN:  Patient/family will call 9-1-1 for emergencies. Patient has life alert system.  5.         COMMUNITY RESOURCES COORDINATION/ HEALTH CARE NAVIGATION:  Patient manages all care. Patient has house cleaner Q Fri and caregiver 2x/wk for grocery shopping and other needs.  6.          FINANCIAL/LEGAL CONCERNS/INTERVENTIONS:  None      SOCIAL HX:  Social History   Tobacco Use   Smoking status: Former    Packs/day: 0.30    Years: 50.00    Pack years: 15.00    Types: Cigarettes    Quit date: 03/05/2013    Years since quitting: 8.2   Smokeless tobacco: Never  Substance Use Topics   Alcohol use: No    CODE STATUS: DNR ADVANCED DIRECTIVES: Y MOST FORM COMPLETE: Y HOSPICE EDUCATION PROVIDED:N  PPS: PATIENT IS INDEPENDENT WITH ALL ADL'S/       [place] LCSW

## 2021-06-16 DIAGNOSIS — L570 Actinic keratosis: Secondary | ICD-10-CM | POA: Diagnosis not present

## 2021-06-16 DIAGNOSIS — L821 Other seborrheic keratosis: Secondary | ICD-10-CM | POA: Diagnosis not present

## 2021-06-16 DIAGNOSIS — L720 Epidermal cyst: Secondary | ICD-10-CM | POA: Diagnosis not present

## 2021-06-16 DIAGNOSIS — L820 Inflamed seborrheic keratosis: Secondary | ICD-10-CM | POA: Diagnosis not present

## 2021-06-23 ENCOUNTER — Ambulatory Visit: Payer: Medicare Other | Admitting: Internal Medicine

## 2021-06-27 ENCOUNTER — Ambulatory Visit (INDEPENDENT_AMBULATORY_CARE_PROVIDER_SITE_OTHER): Payer: Medicare Other | Admitting: Internal Medicine

## 2021-06-27 ENCOUNTER — Encounter: Payer: Self-pay | Admitting: Internal Medicine

## 2021-06-27 ENCOUNTER — Other Ambulatory Visit: Payer: Self-pay

## 2021-06-27 DIAGNOSIS — J9611 Chronic respiratory failure with hypoxia: Secondary | ICD-10-CM

## 2021-06-27 DIAGNOSIS — J449 Chronic obstructive pulmonary disease, unspecified: Secondary | ICD-10-CM | POA: Diagnosis not present

## 2021-06-27 DIAGNOSIS — J9612 Chronic respiratory failure with hypercapnia: Secondary | ICD-10-CM | POA: Diagnosis not present

## 2021-06-27 NOTE — Patient Instructions (Addendum)
Try taper prednisone  09-27-09 for a week and if no difference then just take 5 mg daily  -  if worse start over with 20 mg   Plan A = Automatic = Always=    performist and yupelri 1st thing in am then repeat the performinst 12 h later   Plan B = Backup (to supplement plan A, not to replace it) Only use your albuterol nebulizer as a rescue medication to be used if you can't catch your breath by resting or doing a relaxed purse lip breathing pattern.  - The less you use it, the better it will work when you need it. - Ok to use the inhaler up to  once every 4 hours if you must but call for appointment if use goes up over your usual need       Please schedule a follow up visit in 6 months but call sooner if needed

## 2021-06-27 NOTE — Progress Notes (Signed)
Subjective:    Patient ID: [name]     DOB: 01-27-1932     MRN: [id number]  Brief patient profile:  34  yowf quit smoking 02/2013  referred 02/08/2012 by [name] for intermittent sob was on 02 but stopped in early 2013 and with GOLD III COPD documented 03/2012     History of Present Illness  11/09/2016  f/u ov/[name] re:   GOLD III / maint perforomist/ bud/Incruse/ prn saba neb  Chief Complaint  Patient presents with   Follow-up    Pt called on 11/05/16 with c/o cough with yellow sputum- zpack and pred taper given. She is much improved and barely coughing at all at this point. Breathing is at her normal baseline.    baseline doe = MMRC3 = can't walk 100 yards even at a slow pace at a flat grade s stopping due to sob  Even on 02  rec For flares of cough/ wheeze/ short of breath assoc with change in mucus as you have in the past  >>> zpak / Prednisone 10 mg take  4 each am x 2 days,   2 each am x 2 days,  1 each am x 2 days and stop    03/21/2021  f/u ov/[name] re: performist/ yupelri/pred 10 mg daily  Chief Complaint  Patient presents with   Follow-up    Breathing has improved since her last visit. She is still coughing-white sputum. She has not had to use her albuterol inhaler or neb.   Dyspnea:  Able to get around house easier  Cough: minimal white3  Sleeping: bed is flat/ 45 degree wedge   SABA use: minimal but neb  02: 2.5 lpm 24/7 does not monitor sats with activity  Covid status:   X 3  vax  Rec Prednisone 10 mg  2 until better then taper to 1/2 daily  Make sure you check your oxygen saturation  at your highest level of activity  to be sure it stays over 90% and adjust  02 flow upward to maintain this level if needed but remember to turn it back to previous settings when you stop (to conserve your supply).    06/27/2021  f/u ov/[name] re: endstage copd/ pred dep /02 dep and pred dep at 10 mg daily plus yupelri/performist Chief Complaint  Patient presents with   Follow-up     3 months, COPD Gold III,    Dyspnea:  variably sob better p saba / room to room at home, worse with hot weather  Cough: minimal rattling / not using neb  Sleeping: 45 degrees ok s am flare  SABA use: once in awhile  02: 2.5 lpm never titrates Covid status:   vax x 4    No obvious day to day or daytime variability or assoc excess/ purulent sputum or mucus plugs or hemoptysis or cp or chest tightness, subjective wheeze or overt sinus or hb symptoms.   Sleeping as above  without nocturnal  or early am exacerbation  of respiratory  c/o's or need for noct saba. Also denies any obvious fluctuation of symptoms with weather or environmental changes or other aggravating or alleviating factors except as outlined above   No unusual exposure hx or h/o childhood pna/ asthma or knowledge of premature birth.  Current Allergies, Complete Past Medical History, Past Surgical History, Family History, and Social History were reviewed in [employer] [facility] record.  ROS  The following are not active complaints unless bolded Hoarseness, sore throat,  dysphagia, dental problems, itching, sneezing,  nasal congestion or discharge of excess mucus or purulent secretions, ear ache,   fever, chills, sweats, unintended wt loss or wt gain, classically pleuritic or exertional cp,  orthopnea pnd or arm/hand swelling  or leg swelling, presyncope, palpitations, abdominal pain, anorexia, nausea, vomiting, diarrhea  or change in bowel habits or change in bladder habits, change in stools or change in urine, dysuria, hematuria,  rash, arthralgias, visual complaints, headache, numbness, weakness or ataxia or problems with walking or coordination,  change in mood or  memory.        Current Meds  Medication Sig   acetaminophen (TYLENOL) 650 MG CR tablet Take 650 mg by mouth every 8 (eight) hours as needed for pain.   albuterol (PROVENTIL) (2.5 MG/3ML) 0.083% nebulizer solution USE 1 VIAL VIA NEBULIZER EVERY 6 HOURS  AS NEEDED FOR WHEEZING OR SHORTNESS OF BREATH DX: J44.9   albuterol (VENTOLIN HFA) 108 (90 Base) MCG/ACT inhaler Inhale 2 puffs into the lungs every 4 (four) hours as needed for wheezing or shortness of breath (((PLAN A))). Reported on 01/12/2016   brimonidine (ALPHAGAN) 0.2 % ophthalmic solution Place 1 drop into the right eye 3 (three) times daily.    Cholecalciferol (VITAMIN D) 50 MCG (2000 UT) CAPS Take 1 capsule by mouth daily.   dextromethorphan-guaiFENesin (MUCINEX DM) 30-600 MG per 12 hr tablet Take 1 tablet by mouth at bedtime as needed (with flutter for cough, congestion, and thick mucus).    formoterol (PERFOROMIST) 20 MCG/2ML nebulizer solution Take 2 mLs (20 mcg total) by nebulization 2 (two) times daily.   OXYGEN Use 2L of O2 continuously   predniSONE (DELTASONE) 10 MG tablet TAKE 2 TABLETS BY MOUTH EVERY MORNING UNTIL BETTER, THEN 1 TABLET EVERY MORNING UNTIL SEEN BACK AT OFFICE   revefenacin (YUPELRI) 175 MCG/3ML nebulizer solution Take 3 mLs (175 mcg total) by nebulization daily.                Objective:   Physical Exam   06/27/2021      143 03/21/2021    135  02/21/2021      131 02/07/2021    133 08/09/2020    135  08/10/2019    137  05/14/2019  140  Wt 97 02/08/2012 > 03/24/2012  99 > 103 05/09/2012 > 06/12/2012  99 >   09/24/2012  96 > 98 01/05/2013 >  106 05/01/2013 > 05/05/2013  99 > 05/15/2013  104 > 06/01/2013 115 > 115 07/07/2013 > 114  07/17/13 > 127 09/18/2013 > 12/01/2013  131 >135 03/04/2014 > 03/12/2014  133 > 05/27/2014  133  > 06/28/2014  133 >131 07/26/2014 >133 09/21/2014 139 >  04/14/2015  138  >  05/08/2016  146 > 05/10/2017    145 >  11/12/2017  134 >  11/03/2018   144 >    Vital signs reviewed  06/27/2021  - Note at rest 02 sats  95% on 2.5lpm    General appearance:    w/c bound chronically ill elderly wf nad     HEENT : pt wearing mask not removed for exam due to covid -19 concerns.    NECK :  without JVD/Nodes/TM/ nl carotid upstrokes bilaterally   LUNGS: no acc muscle use,   Mod barrel  contour chest wall with bilateral  Distant bs s audible wheeze and  without cough on insp or exp maneuvers and mod  Hyperresonant  to  percussion bilaterally     CV:  RRR  no s3 or murmur or increase in P2, and no edema   ABD:  soft and nontender with pos mid insp Hoover's  in the supine position. No bruits or organomegaly appreciated, bowel sounds nl  MS:     ext warm without deformities, calf tenderness, cyanosis or clubbing No obvious joint restrictions   SKIN: warm and dry without lesions    NEURO:  alert, approp, nl sensorium with  no motor or cerebellar deficits apparent.                Assessment & Plan:

## 2021-06-28 ENCOUNTER — Encounter: Payer: Self-pay | Admitting: Internal Medicine

## 2021-06-28 NOTE — Assessment & Plan Note (Addendum)
Quit smoking 02/2013   - PFTs 03/24/2012  FEV1  0.91 (43%) ratio 48 and no better p B2,  DLCO 38% corrects to 53%  - changed to perforomist/bud bid 04/2013  -2015 pulmonary rehab  - med calendar 07/17/13 > did not bring 06/28/2014 , .redone 07/26/2014  - 06/28/2014 p extensive coaching HFA effectiveness =    75% (short Ti) - 08/24/14 started IMPACT trial >finished 08/2015  - PFTs 12/13/14  FEV1 0.85 (38%) ratio 45  p 5 % improvement from saba  09/20/2015 Med calendar  - 05/08/2016 changed lama to incruse  - 11/12/2017  After extensive coaching HFA effectiveness =   75% (short Ti) - 11/12/17 > changed to trelegy   - 02/07/2021  Flare of cough on anoro >  Try gerd rx  - Prednisone ceiling of 20 and floor of 10 mg daily 02/16/21  - 02/21/2021 changed to  performist bid and yupelri q am > improved 03/21/2021    Group D in terms of symptom/risk and laba/lama/ICS  therefore appropriate rx at this point >>>  Pred/ perfomist/ yupelri > relatively well compensated   Re pred dose: The goal with a chronic steroid dependent illness is always arriving at the lowest effective dose that controls the disease/symptoms and not accepting a set "formula" which is based on statistics or guidelines that don't always take into account patient  variability or the natural hx of the dz in every individual patient, which may well vary over time.  For now therefore I recommend the patient maintain  Ceiling of 20 mg and floor of 5 mg   Re saba: I spent extra time with pt today reviewing appropriate use of albuterol for prn use on exertion with the following points: 1) saba is for relief of sob that does not improve by walking a slower pace or resting but rather if the pt does not improve after trying this first. 2) If the pt is convinced, as many are, that saba helps recover from activity faster then it's easy to tell if this is the case by re-challenging : ie stop, take the inhaler, then p 5 minutes try the exact same activity (intensity  of workload) that just caused the symptoms and see if they are substantially diminished or not after saba 3) if there is an activity that reproducibly causes the symptoms, try the saba 15 min before the activity on alternate days   If in fact the saba really does help, then fine to continue to use it prn but advised may need to look closer at the maintenance regimen being used to achieve better control of airways disease with exertion.

## 2021-06-28 NOTE — Assessment & Plan Note (Signed)
Onset 02 rx 2013 initially just at hs   - ono RA 05/14/12   4 h 33 min < 89%   So ordered 02 2lpm    - HCO3  32 05/10/13    - 01/05/2013   Walked RA x one lap @ 185 stopped due to  88%   - 01/05/2013  Walked 2lpm 2 laps @ 185 ft each stopped due to  90% sob but improved vs baseline   - RA 05/01/2013 sats = 87%  - 05/10/2017 > 02 sats 88% RA > referred for POC eval > preferred continuous flow - 05/14/2019   Walked RA  1 laps @  approx 154ft each @ slow pace  stopped due to sob and desats to 84% corrected on 2lpm and completed lap s desats - HC03  11/24/2020  HC03  31   Adequate control on present rx, reviewed in detail with pt > no change in rx needed           Each maintenance medication was reviewed in detail including emphasizing most importantly the difference between maintenance and prns and under what circumstances the prns are to be triggered using an action plan format where appropriate.  Total time for H and P, chart review, counseling, reviewing 02 /neb  device(s) and generating customized AVS unique to this office visit / same day charting = 39min

## 2021-07-05 ENCOUNTER — Other Ambulatory Visit: Payer: Medicare Other

## 2021-07-05 ENCOUNTER — Other Ambulatory Visit: Payer: Self-pay

## 2021-07-05 VITALS — BP 132/72 | HR 82 | Temp 97.7°F | Resp 22 | Wt 143.0 lb

## 2021-07-05 DIAGNOSIS — Z515 Encounter for palliative care: Secondary | ICD-10-CM

## 2021-07-05 NOTE — Progress Notes (Signed)
PATIENT NAME: [name] DOB: 1932/04/16 MRN: [id number]  PRIMARY CARE PROVIDER: Denita Lung, MD  RESPONSIBLE PARTY:  Acct ID - Guarantor Home Phone Work Phone Relationship Acct Type  [id number] - [name]  Self P/F     [name], [name], [name]    PLAN OF CARE and INTERVENTIONS:               1.  GOALS OF CARE/ ADVANCE CARE PLANNING:  Remain home and independent with the assistance of private helpers and daughter.               2.  PATIENT/CAREGIVER EDUCATION:  Re-enforced medication regimen               4. PERSONAL EMERGENCY PLAN:  Activate 911 for emergencies.               5.  DISEASE STATUS:  Joint visit with [name], SW and patient.  Patient was napping on our arrival and took several minutes to get to the door.  Patient reports chronic issues with insomnia but reports napping in the day to make up for loss.   Patient had a recent visit with pulmonology.  She feels the current regimen is working well at this time.  No recent use of antibiotics.  Patient currently trying to taper down to 5 mg of prednisone.  Continues with a productive cough that has white sputum production.  Reviewed recent notes from pulmonology and re-enforced nebulizer and inhaler use.   Patient is not using assistive devices with ambulation.  She reports no recent falls.  Appetite has been very good.  Patient notes the prednisone has caused her to snack more often and she has reported an 8 lb weight gain since starting prednisone.  Patient feels she is doing well over-all.  No new concerns or needs at this time.  Visit scheduled for next month.   HISTORY OF PRESENT ILLNESS:  87 year old female with COPD.  Patient is being followed by Palliative Care monthly and PRN.   CODE STATUS: DNR ADVANCED DIRECTIVES: Yes MOST FORM: Yes PPS: 50%   PHYSICAL EXAM:   VITALS: Today's Vitals   07/05/21 1216  BP: 132/72  Pulse: 82  Resp: (!) 22  Temp: 97.7 F (36.5 C)   SpO2: 97%  Weight: 143 lb (64.9 kg)  PainSc: 0-No pain    LUNGS: clear to auscultation , decreased breath sounds CARDIAC: Cor RRR}  EXTREMITIES: - for edema SKIN: Skin color, texture, turgor normal. No rashes or lesions or normal  NEURO: negative       Lorenza Burton, RN

## 2021-07-05 NOTE — Progress Notes (Signed)
COMMUNITY PALLIATIVE CARE SW NOTE  PATIENT NAME: [name] DOB: 13-Aug-1932 MRN: [id number]  PRIMARY CARE PROVIDER: Denita Lung, MD  RESPONSIBLE PARTY:  Acct ID - Guarantor Home Phone Work Phone Relationship Acct Type  [id number] - [phone]  Self P/F     [address], [name], [place] [address]     PLAN OF CARE and INTERVENTIONS:             GOALS OF CARE/ ADVANCE CARE PLANNING:  Patient is a DNR. MOST form completed. Daughter is HCPOA. Patients goal is to remain in her home as independent as possible.   2.         SOCIAL/EMOTIONAL/SPIRITUAL ASSESSMENT/ INTERVENTIONS:  SW and RN Almyra Free met with patient in patients home for monthly palliative care visit. Patient resides in one level home alone.   Patient recently saw pulmonology and recommended a taper of prednisone to 58m/5mg alternating days for one week, should patient see no change or difference continue with 54mdaily, if worse restart 2065maily. Patient currently does not feel comfortable with tapering down to only 5mg82mily. Patient updated team on current medical status. Patient continues on 2.5LPM continuously. No other medication changes. Patient eating well with current weight of 143lb. Patient reports no pain at this time. Patient states that she does not sleep well at HS but does nap during the day. Patient's continues to have private care giver come in to assist with light housekeeping and grocery shopping.   RN reviewed meds and vitals. Patient continues to have an issues with breathing, more during exertion. Patient shares that she does cough at times has decreased and is not coughing anything up. Patient sees podiatry next month for routine toenail trimming.  No psychosocial needs at this time. Patient has adequate food and transportation.     Palliative care will continue to monitor and assist with long term care planning as needed.   3.         PATIENT/CAREGIVER EDUCATION/ COPING:  Patient A&O.  Patient able to answer and address all questions appropriately. Patient continues to enjoy word puzzles and watching her bird feeders. Daughter is supportive.   4.         PERSONAL EMERGENCY PLAN:  Patient/family will call 9-1-1 for emergencies. Patient has life alert system.   5.         COMMUNITY RESOURCES COORDINATION/ HEALTH CARE NAVIGATION:  Patient manages all care. Patient has house cleaner Q Fri and caregiver 2x/wk for grocery shopping and other needs.   6.         FINANCIAL/LEGAL CONCERNS/INTERVENTIONS:  None      SOCIAL HX:  Social History   Tobacco Use   Smoking status: Former    Packs/day: 0.30    Years: 50.00    Pack years: 15.00    Types: Cigarettes    Quit date: 03/05/2013    Years since quitting: 8.3   Smokeless tobacco: Never  Substance Use Topics   Alcohol use: No    CODE STATUS: DNR  ADVANCED DIRECTIVES: Y MOST FORM COMPLETE: Y HOSPICE EDUCATION PROVIDED: N  PPS: Patient is independent with ADL's.   Time spent: 30 min.       AsiaDoreene ElandSWSouth Saddle [place]te

## 2021-07-10 ENCOUNTER — Telehealth: Payer: Self-pay | Admitting: Internal Medicine

## 2021-07-10 MED ORDER — FORMOTEROL FUMARATE 20 MCG/2ML IN NEBU: 20.0000 ug | INHALATION_SOLUTION | Freq: Two times a day (BID) | RESPIRATORY_TRACT | 0 refills | Status: DC

## 2021-07-10 NOTE — Telephone Encounter (Signed)
Called and spoke with Patient.  Patient stated she is needing a refill for Perforomist  sent to her local [facility]. Patient stated she was told by [facility] that she received 2 Perforomist shipments  in March and insurance will cover another Perforomist shipment until August.  Patient requested small Perforomist prescription be sent to local pharmacy to cover until August. Perforomist prescription sent to requested pharmacy.  Nothing further at this time.

## 2021-07-10 NOTE — Telephone Encounter (Signed)
pt is calling to get rx for Performist sent to [facility] [address] states that [facility] said that her insurance wouldnt pay for rx because it was a double shipement in march.. pt also mentions that her left foot and ankle is swollen but has not fallen  thinks it may have something to do with current medications shes on.  [phone]

## 2021-07-17 ENCOUNTER — Telehealth: Payer: Self-pay | Admitting: Internal Medicine

## 2021-07-17 MED ORDER — PREDNISONE 10 MG PO TABS: ORAL_TABLET | ORAL | 0 refills | Status: DC

## 2021-07-17 NOTE — Telephone Encounter (Addendum)
Spoke to patient, who reports of left foot edema. She has not contacted PCP,as she was not sure who she should contact. Advised patient to contact PCP regarding foot edema.  She stated that her breathing is baseline. No worsening sx.  Rx for prednisone has been sent to preferred pharmacy per request.  Nothing further needed.

## 2021-07-18 ENCOUNTER — Encounter: Payer: Self-pay | Admitting: Medical

## 2021-07-18 ENCOUNTER — Ambulatory Visit (INDEPENDENT_AMBULATORY_CARE_PROVIDER_SITE_OTHER): Payer: Medicare Other | Admitting: Medical

## 2021-07-18 ENCOUNTER — Other Ambulatory Visit: Payer: Self-pay

## 2021-07-18 VITALS — BP 128/82 | HR 86 | Temp 97.8°F | Wt 144.0 lb

## 2021-07-18 DIAGNOSIS — R0989 Other specified symptoms and signs involving the circulatory and respiratory systems: Secondary | ICD-10-CM

## 2021-07-18 DIAGNOSIS — R2242 Localized swelling, mass and lump, left lower limb: Secondary | ICD-10-CM

## 2021-07-18 DIAGNOSIS — Z87891 Personal history of nicotine dependence: Secondary | ICD-10-CM

## 2021-07-18 DIAGNOSIS — S9030XA Contusion of unspecified foot, initial encounter: Secondary | ICD-10-CM

## 2021-07-18 NOTE — Progress Notes (Signed)
Subjective:  [name] is a 87 y.o. female who presents for Chief Complaint  Patient presents with   other    Lt. Leg swelling couple weeks,      Here accompanied by friend today  She reports for couple weeks feeling swelling in the left foot from the ball of the foot to the toes.  Feels swelling mostly in the toes.  She denies pain.  No injury, no trauma.  She denies redness.  No fever.  No numbness or tingling.  She is wearing lace up shoes today.  She says she often goes barefooted at home.  She has limited exercise but walking and she has a type of foot bicycle device at home that she is not using it for exercise currently.  She has COPD on oxygen.  She has oxygen with her today.  Her exercise limited by COPD  She has thickened toenails and sees podiatry for nail care  She denies other leg or foot swelling.  No shortness of breath other than her COPD, no palpitations of the heart.  No other aggravating or relieving factors.    No other c/o.  The following portions of the patient's history were reviewed and updated as appropriate: allergies, current medications, past family history, past medical history, past social history, past surgical history and problem list.  ROS Otherwise as in subjective above  Objective: BP 128/82   Pulse 86   Temp 97.8 F (36.6 C)   Wt 144 lb (65.3 kg)   BMI 22.55 kg/m   Wt Readings from Last 3 Encounters:  07/18/21 144 lb (65.3 kg)  07/05/21 143 lb (64.9 kg)  06/27/21 143 lb (64.9 kg)    General appearance: alert, no distress, well developed, well nourished Left dorsal foot over the distal third through fourth metatarsal with some purplish brown bruising, no other bruising noted of the foot, she has thickened yellowish toenails throughout. I see no obvious swelling of the toes or feet bilaterally Her feet and toes are nontender 1+ pedal pulses of both feet but capillary refill is decreased in toes throughout.  Her feet are cooler to  touch than the lower legs bilaterally Monofilament and sensation of feet and toes is normal Strength of the toes and feet seem normal    Assessment: Encounter Diagnoses  Name Primary?   Localized swelling of left foot Yes   Contusion of dorsum of foot    Prolonged capillary refill time    Decreased pulses in feet    Former smoker      Plan: We discussed her symptoms and concerns.  There is no obvious swelling on the exam.  I suspect 1 of 2 things.  I suspect she is either wearing shoes tight enough to may be caused some localized swelling, or this could be a vascular issue.  She has a long history of tobacco use, former smoker, COPD.  I will set her up for ABIs of the legs.  I recommend she continue walking for exercise as tolerated but also consider using the bicycle till she has at home to get additional exercise with the legs  I suspect the bruising on her left foot dorsum is from shoes being too tight.  We discussed that if her ABIs are abnormal we would have vascular surgery do a consult to discuss recommendations however I do not think she would be a surgical candidate given her overall health issues; but they may have other suggestions for treatment  [name] was  seen today for other.  Diagnoses and all orders for this visit:  Localized swelling of left foot -     VAS [place] ABI WITH/WO TBI; Future  Contusion of dorsum of foot -     VAS [place] ABI WITH/WO TBI; Future  Prolonged capillary refill time -     VAS [place] ABI WITH/WO TBI; Future  Decreased pulses in feet -     VAS [place] ABI WITH/WO TBI; Future  Former smoker -     VAS [place] ABI WITH/WO TBI; Future   Follow up: pending ABIs

## 2021-07-24 ENCOUNTER — Telehealth (INDEPENDENT_AMBULATORY_CARE_PROVIDER_SITE_OTHER): Payer: Medicare Other | Admitting: Family Medicine

## 2021-07-24 VITALS — Ht 67.0 in | Wt 144.0 lb

## 2021-07-24 DIAGNOSIS — Z20822 Contact with and (suspected) exposure to covid-19: Secondary | ICD-10-CM | POA: Diagnosis not present

## 2021-07-24 NOTE — Progress Notes (Signed)
   Subjective:    Patient ID: [name], female    DOB: 26-Jun-1932, 87 y.o.   MRN: [id number]  HPI Documentation for virtual audio  telecommunications through [place] encounter: She has unable to do audio and video.  The patient was located at home. 2 patient identifiers used.  The provider was located in the office. The patient did consent to this visit and is aware of possible charges through their insurance for this visit.  The other persons participating in this telemedicine service were none. Time spent on call was 5 minutes and in review of previous records >10 minutes total for counseling and coordination of care.  This virtual service is not related to other E/M service within previous 7 days.  She states that one of her sisters has [place] and another sister visited her while she had [place] and then came by to visit [name].  She is concerned over possible exposure to COVID.  Review of Systems     Objective:   Physical Exam Alert and in no distress otherwise not examined       Assessment & Plan:  Exposure to COVID-19 virus I explained that it is unlikely that she has been exposed to COVID as the sister that visited her was not having any symptoms nor tested positive so she should be protected.

## 2021-08-01 ENCOUNTER — Other Ambulatory Visit: Payer: Self-pay

## 2021-08-01 ENCOUNTER — Ambulatory Visit (INDEPENDENT_AMBULATORY_CARE_PROVIDER_SITE_OTHER): Payer: Medicare Other | Admitting: Podiatry

## 2021-08-01 ENCOUNTER — Encounter: Payer: Self-pay | Admitting: Podiatry

## 2021-08-01 DIAGNOSIS — N289 Disorder of kidney and ureter, unspecified: Secondary | ICD-10-CM | POA: Diagnosis not present

## 2021-08-01 DIAGNOSIS — M79676 Pain in unspecified toe(s): Secondary | ICD-10-CM | POA: Diagnosis not present

## 2021-08-01 DIAGNOSIS — B351 Tinea unguium: Secondary | ICD-10-CM

## 2021-08-01 NOTE — Progress Notes (Signed)
This patient returns to my office for at risk foot care.  This patient requires this care by a professional since this patient will be at risk due to having  renal insufficiency.  This patient is unable to cut nails herself since the patient cannot reach her nails.These nails are painful walking and wearing shoes.  This patient presents for at risk foot care today.    General Appearance  Alert, conversant and in no acute stress.  Vascular  Dorsalis pedis and posterior tibial  pulses are weakly  palpable  bilaterally.  Capillary return is within normal limits  Bilaterally. Cold feet. Bilaterally.  Absent digital hair  B/L.  Neurologic  Senn-Weinstein monofilament wire test diminished   bilaterally. Muscle power within normal limits bilaterally.  Nails Thick disfigured discolored nails with subungual debris  from hallux to fifth toes bilaterally. No evidence of bacterial infection or drainage bilaterally.  Orthopedic  No limitations of motion  feet .  No crepitus or effusions noted.  No bony pathology or digital deformities noted.  Skin  normotropic skin with no porokeratosis noted bilaterally.  No signs of infections or ulcers noted.   Brown discoloration dorsum left foot is fading.  Onychomycosis  Pain in right toes  Pain in left toes  Consent was obtained for treatment procedures.   Mechanical debridement of nails 1-5  bilaterally performed with a nail nipper.  Filed with dremel without incident.    Return office visit   3 months                   Told patient to return for periodic foot care and evaluation due to potential at risk complications.   Gardiner Barefoot DPM

## 2021-08-03 ENCOUNTER — Other Ambulatory Visit: Payer: Self-pay

## 2021-08-03 ENCOUNTER — Other Ambulatory Visit: Payer: Medicare Other

## 2021-08-03 VITALS — BP 154/82 | HR 60 | Temp 97.5°F | Wt 144.0 lb

## 2021-08-03 DIAGNOSIS — Z515 Encounter for palliative care: Secondary | ICD-10-CM

## 2021-08-03 NOTE — Progress Notes (Signed)
PATIENT NAME: [name] DOB: 26-Jul-1932 MRN: [id number]  PRIMARY CARE PROVIDER: Denita Lung, MD  RESPONSIBLE PARTY:  Acct ID - Guarantor Home Phone Work Phone Relationship Acct Type  [id number] - [phone]  Self P/F     [address], [name],  [address]    PLAN OF CARE and INTERVENTIONS:               1.  GOALS OF CARE/ ADVANCE CARE PLANNING:  Remain home with assistance of her private caregivers and daughter.               2.  PATIENT/CAREGIVER EDUCATION:  CHF and COPD.               4. PERSONAL EMERGENCY PLAN:  Activate 911 for emergencies.               5.  DISEASE STATUS:  Joint visit completed with [name], SW.  Patient reports doing well over the last month.  She was seen last month due to swelling in her left foot.   Patient states provider did not note any edema on that visit.  Patient states she may have some puffiness present to the top of her foot and edema around her ankle that fluctuates.  She does have 1+ left ankle edema today.  We discussed s/s of CHF and monitoring her weight more often.  She has base weight of 144 lbs as 07/18/21.  Encouraged patient to check this more often, monitor lower extremity edema, abdominal fullness or worsening shortness of breath.  Patient is advised to contact her PCP or PC for follow up should symptoms present.  Patient verbalized understanding.  No assistive devices are used.  Patient denies any recent falls.   Patient is compliant with medications and was able to show her nebulizer treatment she is doing daily.  No medication refills are needed at this time.  Patient continues to take naps periodically.  She does not feel she is napping any more than normal.  No issues with insomnia.  Patient denies chest pain, dizziness, headaches or weakness.  She continues with shortness of breath that remains at her baseline.  She remains on 2.5 L of oxygen.  Appetite remains good.  Patient notes her weight gain due to her  increased appetite.  She has been taking prednisone 20 mg daily and has not been able to taper this down to 10 mg as of today.    HISTORY OF PRESENT ILLNESS:  87 year old female with COPD.  Patient is being followed by Palliative Care monthly and PRN.   CODE STATUS: DNR ADVANCED DIRECTIVES: No MOST FORM: Yes PPS: 50%   PHYSICAL EXAM:   VITALS: Today's Vitals   08/03/21 1210  BP: (!) 154/82  Pulse: 60  Temp: (!) 97.5 F (36.4 C)  SpO2: 94%  Weight: 144 lb (65.3 kg)  PainSc: 0-No pain    LUNGS: positive findings: rhonchi left lower posterior CARDIAC: Cor RRR}  EXTREMITIES: left ankle with 1+ pitting edema SKIN: Skin color, texture, turgor normal. No rashes or lesions or normal  NEURO: negative.       Lorenza Burton, RN

## 2021-08-03 NOTE — Progress Notes (Signed)
COMMUNITY PALLIATIVE CARE SW NOTE  PATIENT NAME: [name] DOB: [birth year] MRN: [id number]  PRIMARY CARE PROVIDER: Denita Lung, MD  RESPONSIBLE PARTY:  Acct ID - Guarantor Home Phone Work Phone Relationship Acct Type  [id number] - [id number]  Self P/F     [address], [name], [place] [address]     PLAN OF CARE and INTERVENTIONS:             [facility] is a DNR. MOST form completed. Daughter is HCPOA. Patients goal is to remain in her home as independent as possible.   2.         SOCIAL/EMOTIONAL/SPIRITUAL ASSESSMENT/ INTERVENTIONS:  SW and RN Almyra Free met with patient in patients home for monthly palliative care visit. Patient resides in one level home alone.   Patient recently saw podiatry for swelling in feet, no new orders or recommendations. PC suggested patient keep feet elevated. Patient continues on prednisone to 17m/5mg alternating days for one week. Patient updated team on current medical status. Patient continues on 2.5LPM continuously. No other medication changes. Patient eating well with current weight of 143lb. Patient reports no pain at this time. Patient states that she does not sleep well at HS but does nap during the day. Patient's continues to have private care giver come in to assist with light housekeeping and grocery shopping.   RN reviewed meds and vitals. Patient continues to have an issues with breathing, more during exertion. Patient shares that she does cough at times has decreased and is not coughing anything up. Patient has some swelling in bilateral feet/ankles. RN educated patients on signs of CHF and fluid retention and to [facility] aware of any increase of these symptoms. Patient educated on keeping legs elevated as well.    No psychosocial needs at this time. Patient has adequate food and transportation.     Palliative care will continue to monitor and assist with long term care planning as  needed.   3.         PATIENT/CAREGIVER EDUCATION/ COPING:  Patient A&O. Patient able to answer and address all questions appropriately. Patient continues to enjoy word puzzles and watching her bird feeders. Daughter is supportive.   4.         PERSONAL EMERGENCY PLAN:  Patient/family will call 9-1-1 for emergencies. Patient has life alert system.   5.         COMMUNITY RESOURCES COORDINATION/ HEALTH CARE NAVIGATION:  Patient manages all care. Patient has house cleaner Q Fri and caregiver 2x/wk for grocery shopping and other needs.   6.         FINANCIAL/LEGAL CONCERNS/INTERVENTIONS:  None          SOCIAL HX:  Social History   Tobacco Use   Smoking status: Former    Packs/day: 0.30    Years: 50.00    Pack years: 15.00    Types: Cigarettes    Quit date: 03/05/2013    Years since quitting: 8.4   Smokeless tobacco: Never  Substance Use Topics   Alcohol use: No    CODE STATUS: DNR ADVANCED DIRECTIVES: Y MOST FORM COMPLETE:  Y HOSPICE EDUCATION PROVIDED: N  PPS: Patient is independent with all ADL's.   Time spent: 40 min       ASomaliaPHenrene Pastor LSouth [place]

## 2021-08-20 ENCOUNTER — Other Ambulatory Visit: Payer: Self-pay | Admitting: Internal Medicine

## 2021-08-21 ENCOUNTER — Telehealth: Payer: Self-pay | Admitting: Internal Medicine

## 2021-08-21 MED ORDER — PREDNISONE 10 MG PO TABS: ORAL_TABLET | ORAL | 0 refills | Status: DC

## 2021-08-21 NOTE — Telephone Encounter (Signed)
Called spoke with patient.  According to the last note from Dr. Melvyn Novas in July  "Try taper prednisone  09-27-09 for a week and if no difference then just take 5 mg daily "  Patient states she is doing better but just finished a Z pac next office visit is in January , the RX instructions state to take until next visit.   Sending to Dr. Melvyn Novas as to how to refill the prednisone for patient. She still has eight 10 mg tablets she can cut in half to make '5mg'$  , just needs clarification for refill order 10 mg or 5 mg

## 2021-08-21 NOTE — Telephone Encounter (Signed)
To keep it simple I would just refill 10 mg  x 100 and let her take a half daily but if worse take the whole

## 2021-08-21 NOTE — Telephone Encounter (Signed)
Patient is aware of below message and voiced her understanding.  Rx has been sent to preferred pharmacy. Nothing further needed at this time.

## 2021-08-30 ENCOUNTER — Telehealth: Payer: Self-pay | Admitting: Internal Medicine

## 2021-08-30 NOTE — Telephone Encounter (Signed)
Called and spoke with patient. She states she "just doesn't feel good" and she "don't feel like doing anything". States it is hard to explain, and she feels fatigue. She stated that the only thing that is different is she started Prednisone 5 mg. Reports she has been feeling like this since she started the medication.   Dr. Melvyn Novas please advise  Thank you

## 2021-08-31 NOTE — Telephone Encounter (Signed)
Spoke with the pt  She reports still feeling fatigued  No other symptoms  She states that she has noticed this since decreased to 5 mg pred daily  I advised that per last instructions she can increased to 10 mg if feeling worse  She will try this and go back to 5 mg when she is feeling better  Will call if not improving or has further questions  Forwarding to MW just as Juluis Rainier

## 2021-08-31 NOTE — Telephone Encounter (Signed)
Pt calling back again.

## 2021-09-06 ENCOUNTER — Other Ambulatory Visit: Payer: Medicare Other

## 2021-09-06 ENCOUNTER — Other Ambulatory Visit: Payer: Self-pay

## 2021-09-06 VITALS — BP 132/80 | HR 90 | Temp 97.0°F | Resp 28

## 2021-09-06 DIAGNOSIS — Z515 Encounter for palliative care: Secondary | ICD-10-CM

## 2021-09-06 NOTE — Progress Notes (Signed)
PATIENT NAME: [name] DOB: [birth year] MRN: [id number]  PRIMARY CARE PROVIDER: Denita Lung, MD  RESPONSIBLE PARTY:  Acct ID - Guarantor Home Phone Work Phone Relationship Acct Type  [id number] - [id number]  Self P/F     [address], [name], [place] [address]    PLAN OF CARE and INTERVENTIONS:               1.  GOALS OF CARE/ ADVANCE CARE PLANNING:  Patient desires to remain in her home and independent for as long as possible.                2.  PATIENT/CAREGIVER EDUCATION:  Use of PRN Albuterol               4. PERSONAL EMERGENCY PLAN:  Activate 911 for emergencies.                5.  DISEASE STATUS:  Joint visit completed with [name], SW and patient.   Patient visibly short of breath with exertion.  Patient required azithromycin at the end of last month due to worsening cough.  Coughing is now rare and phlegm is clear in appearance. She attempted to decrease her prednisone to 5 mg but was unable to do so.  Patient noted her shortness of breath was significantly worse.  She reports being unable to engage in activities that required her to exert herself such as walking outside onto her back patio.  Patient has started taking prednisone 10 mg  as of 08/31/2021.   She reports an improvement in her breathing and is now able to engage in activities such as above.  Patient is visibly short of breath at rest and discussion completed on using albuterol nebs as needed for shortness of breath or wheezing.   Refill completed for Azithromycin and will be ready tomorrow after 830 am.  Patient will have a caregiver pick up her prescription.   Patient reports her appetite continues to be good.  She is cooking occasionally.  Patient continues to caregiver pick up groceries for her weekly.  She also has an aide that assists in house cleaning.  Patient does not require the use of assistive devices for ambulation.  She denies any recent falls.  Patient continues to take naps  periodically during the day.  She denies any issus with insomnia.  Patient reports her daughter will be present next week.  Patient is planning on having a COVID booster and flu shot during this time.  Encouraged patient to have daughter call with any questions or concerns.    HISTORY OF PRESENT ILLNESS:  87 year old female with COPD.  Patient is being followed by Palliative Care monthly and PRN.  CODE STATUS:  DNR has form. ADVANCED DIRECTIVES: No MOST FORM: Yes PPS: 50%   PHYSICAL EXAM:   VITALS: Today's Vitals   09/06/21 1211  BP: 132/80  Pulse: 90  Resp: (!) 28  Temp: (!) 97 F (36.1 C)  SpO2: 93%  PainSc: 0-No pain    LUNGS: positive findings: tachypnea CARDIAC: Cor RRR}  EXTREMITIES: trace ankle edema to left lateral ankle. SKIN: Skin color, texture, turgor normal. No rashes or lesions or normal  NEURO: negative       Lorenza Burton, RN

## 2021-09-06 NOTE — Progress Notes (Signed)
COMMUNITY PALLIATIVE CARE SW NOTE  PATIENT NAME: [name] DOB: 12-Mar-1932 MRN: [id number]  PRIMARY CARE PROVIDER: Denita Lung, MD  RESPONSIBLE PARTY:  Acct ID - Guarantor Home Phone Work Phone Relationship Acct Type  [id number] - [place],[id number]  Self P/F     [place], [name], [place] [address]     PLAN OF CARE and INTERVENTIONS:             GOALS OF CARE/ ADVANCE CARE PLANNING:  Patient is a DNR. MOST form completed. Daughter is HCPOA. Patients goal is to remain in her home as independent as possible.   2.         SOCIAL/EMOTIONAL/SPIRITUAL ASSESSMENT/ INTERVENTIONS:  SW and RN Almyra Free met with patient in patients home for monthly palliative care visit. Patient resides in one level home alone.   Patient attempted to taper prednisone down to $Remov'5mg'xRFUPy$ , this was not beneficial for patient as her breathing became more problematic. Patient upped prednisone back to $Remov'10mg'OEKgsM$ . Patient updated team on current medical status. Patient continues on 2.5LPM continuously. No other medication changes. Patient reports no pain at this time. Patient states that she continues to not sleep well at HS but does nap during the day. Patient's continues to have private care giver come in to assist with light housekeeping and grocery shopping.   RN reviewed meds and vitals. Patient continues to have an issues with breathing, more during exertion. Patient appears to be breathing heavier at rest this visit, versus previous visits. Patient shares that she does cough at times has decreased and is not coughing anything up, if does it is clear in color. Patients swelling in bilateral feet/ankles has decreased. Patient out of Z-Pac, RN called in re-fill to pharmacy. Patient is scheduled to have flu shot and possible COVID booster next week.   No psychosocial needs at this time. Patient has adequate food and transportation. Patients daughter will be visiting next week (09/11/21).    Palliative care will continue  to monitor and assist with long term care planning as needed.   3.         PATIENT/CAREGIVER EDUCATION/ COPING:  Patient A&O. Patient able to answer and address all questions appropriately. Patient continues to enjoy word puzzles and watching her bird feeders. Daughter is supportive.   4.         PERSONAL EMERGENCY PLAN:  Patient/family will call 9-1-1 for emergencies. Patient has life alert system.   5.         COMMUNITY RESOURCES COORDINATION/ HEALTH CARE NAVIGATION:  Patient manages all care. Patient has house cleaner Q Fri and caregiver 2x/wk for grocery shopping and other needs.   6.         FINANCIAL/LEGAL CONCERNS/INTERVENTIONS:  None         SOCIAL HX:  Social History   Tobacco Use   Smoking status: Former    Packs/day: 0.30    Years: 50.00    Pack years: 15.00    Types: Cigarettes    Quit date: 03/05/2013    Years since quitting: 8.5   Smokeless tobacco: Never  Substance Use Topics   Alcohol use: No    CODE STATUS: DNR ADVANCED DIRECTIVES: Y MOST FORM COMPLETE: Y HOSPICE EDUCATION PROVIDED: N  PPS: Patient is independent with all ADL's at this time Patient is ale to cook small things, continues to have assistance from caregivers. Patient is A&O x3 with average insight and judgement.    Time spent: 40 min  [name], [place]

## 2021-09-07 ENCOUNTER — Telehealth: Payer: Self-pay

## 2021-09-07 ENCOUNTER — Telehealth: Payer: Self-pay | Admitting: Internal Medicine

## 2021-09-07 NOTE — Telephone Encounter (Signed)
1056 am.  Phone call made to Dr. Gustavus Bryant office to advise of patient's current respiratory status, recent use of azithromycin and trial decrease of prednisone to 5 mg but returned to 10 mg on 08/31/21.  Message will be given to MD nurse.

## 2021-09-07 NOTE — Telephone Encounter (Signed)
Called and spoke with Almyra Free, Palliative RN.  Almyra Free stated she wanted to update Dr Melvyn Novas on Patient's current condition.  Almyra Free stated Patient felt she needed azithromycin at the end of last month, so she took azithromycin.  Patient attempted to decrease Prednisone '5mg'$ , but was unable.  Almyra Free stated Patient was very sob, and was unable to walk a short distance to her patio.  Almyra Free stated Patient has been back on Prednisone '10mg'$  daily, since 08/31/21. Almyra Free stated she saw Patient yesterday and she was having increased sob, even at rest.  Almyra Free stated Patient is having labored breathing and unable to do any activities. Almyra Free stated lung sounds were diminished, but clear.  Message routed to Dr. Melvyn Novas as Juluis Rainier

## 2021-09-18 ENCOUNTER — Other Ambulatory Visit: Payer: Self-pay

## 2021-09-18 ENCOUNTER — Ambulatory Visit (INDEPENDENT_AMBULATORY_CARE_PROVIDER_SITE_OTHER): Payer: Medicare Other | Admitting: Family Medicine

## 2021-09-18 VITALS — BP 122/72 | HR 101 | Temp 98.4°F | Wt 145.0 lb

## 2021-09-18 DIAGNOSIS — Z23 Encounter for immunization: Secondary | ICD-10-CM | POA: Diagnosis not present

## 2021-09-18 DIAGNOSIS — J9612 Chronic respiratory failure with hypercapnia: Secondary | ICD-10-CM | POA: Diagnosis not present

## 2021-09-18 DIAGNOSIS — R609 Edema, unspecified: Secondary | ICD-10-CM | POA: Insufficient documentation

## 2021-09-18 DIAGNOSIS — R627 Adult failure to thrive: Secondary | ICD-10-CM | POA: Diagnosis not present

## 2021-09-18 NOTE — Progress Notes (Signed)
   Subjective:    Patient ID: [name], female    DOB: [birth year], 87 y.o.   MRN: [id number]  HPI She is here with her daughter.  Her daughter has been helping coordinate the care for [place].  Presently she is on 2-1/2 L/min of oxygen as well as prednisone.  She is being followed by Dr. Melvyn Novas.  She is getting palliative care from [facility].  The nurses coming out once per month.  She is getting help with her meals, housekeeping and with laundry.  Her physical status is slowly going downhill and that she is becoming more and more and active mainly because of her breathing issues.  The daughter is interested in physical therapy and possibly occupational therapy to see if this will help.  She also notes difficulty peripheral edema.  Mainly in the ankles.  She does sit around a lot.   Review of Systems     Objective:   Physical Exam Alert and in no distress exam of her lower extremities show 1+ pitting edema in the ankle area.     Assessment & Plan:  Failure to thrive in adult - Plan: Ambulatory referral to Physical Therapy, Ambulatory referral to Occupational Therapy  Need for influenza vaccination - Plan: Flu Vaccine QUAD High Dose(Fluad), CANCELED: Flu vaccine HIGH DOSE PF (Fluzone High dose)  Need for COVID-19 vaccine - Plan: Ambulance person Booster  Chronic respiratory failure with hypercapnia ([place]) - Plan: Ambulatory referral to Physical Therapy, Ambulatory referral to Occupational Therapy  Dependent edema I think it is reasonable to get physical therapy and Occupational Therapy to see if there is any else we can do to get her more physically active.  She seems to be handling the present situation fairly well and does get help with meals, housekeeping and laundry.  She apparently bates her self.  Did recommend using support stockings for her feet as putting more medication in the system is not necessarily ideal.

## 2021-09-18 NOTE — Telephone Encounter (Signed)
error 

## 2021-09-19 ENCOUNTER — Telehealth: Payer: Self-pay | Admitting: Family Medicine

## 2021-09-19 NOTE — Telephone Encounter (Signed)
[facility] called and said they don't offer PT or OT services and will not be able to see pt. They said she needs to be admitted to home health because they only offer hospice and palliative care.

## 2021-09-20 NOTE — Telephone Encounter (Signed)
Pt. Daughter called back to check on the status of the PT/OT order. I let her know that Kim faxed it earlier today to [facility] and they would be calling her to set this up in home.

## 2021-09-20 NOTE — Telephone Encounter (Signed)
Pt's daughter called and states that she heard from [facility] and they only do PT in office and she needs home PT. She suggested [facility]. Please advise [name] at [phone].

## 2021-09-21 DIAGNOSIS — Z7952 Long term (current) use of systemic steroids: Secondary | ICD-10-CM | POA: Diagnosis not present

## 2021-09-21 DIAGNOSIS — Z9181 History of falling: Secondary | ICD-10-CM | POA: Diagnosis not present

## 2021-09-21 DIAGNOSIS — R627 Adult failure to thrive: Secondary | ICD-10-CM | POA: Diagnosis not present

## 2021-09-21 DIAGNOSIS — R600 Localized edema: Secondary | ICD-10-CM | POA: Diagnosis not present

## 2021-09-21 DIAGNOSIS — Z9981 Dependence on supplemental oxygen: Secondary | ICD-10-CM | POA: Diagnosis not present

## 2021-09-21 DIAGNOSIS — J9612 Chronic respiratory failure with hypercapnia: Secondary | ICD-10-CM | POA: Diagnosis not present

## 2021-09-26 ENCOUNTER — Telehealth: Payer: Self-pay | Admitting: Family Medicine

## 2021-09-26 ENCOUNTER — Telehealth: Payer: Self-pay

## 2021-09-26 DIAGNOSIS — Z9981 Dependence on supplemental oxygen: Secondary | ICD-10-CM | POA: Diagnosis not present

## 2021-09-26 DIAGNOSIS — J9612 Chronic respiratory failure with hypercapnia: Secondary | ICD-10-CM | POA: Diagnosis not present

## 2021-09-26 DIAGNOSIS — Z7952 Long term (current) use of systemic steroids: Secondary | ICD-10-CM | POA: Diagnosis not present

## 2021-09-26 DIAGNOSIS — R600 Localized edema: Secondary | ICD-10-CM | POA: Diagnosis not present

## 2021-09-26 DIAGNOSIS — Z9181 History of falling: Secondary | ICD-10-CM | POA: Diagnosis not present

## 2021-09-26 DIAGNOSIS — R627 Adult failure to thrive: Secondary | ICD-10-CM | POA: Diagnosis not present

## 2021-09-26 NOTE — Telephone Encounter (Signed)
[name] with [facility]  [phone]  Wants order to continue PT 1 x week for 1 week ( which was last week)  2 x  week for 4 weeks and then    1 x a week for 4 weeks

## 2021-09-26 NOTE — Telephone Encounter (Signed)
Done KH 

## 2021-09-26 NOTE — Telephone Encounter (Signed)
Called pt to find out how she would like to have her parking placard application obtained. It is in the brown folder up front for pick and a copy has been made for pt chart. [place]

## 2021-09-27 DIAGNOSIS — Z9981 Dependence on supplemental oxygen: Secondary | ICD-10-CM | POA: Diagnosis not present

## 2021-09-27 DIAGNOSIS — R627 Adult failure to thrive: Secondary | ICD-10-CM | POA: Diagnosis not present

## 2021-09-27 DIAGNOSIS — R600 Localized edema: Secondary | ICD-10-CM | POA: Diagnosis not present

## 2021-09-27 DIAGNOSIS — Z7952 Long term (current) use of systemic steroids: Secondary | ICD-10-CM | POA: Diagnosis not present

## 2021-09-27 DIAGNOSIS — Z9181 History of falling: Secondary | ICD-10-CM | POA: Diagnosis not present

## 2021-09-27 DIAGNOSIS — J9612 Chronic respiratory failure with hypercapnia: Secondary | ICD-10-CM | POA: Diagnosis not present

## 2021-09-28 DIAGNOSIS — J9612 Chronic respiratory failure with hypercapnia: Secondary | ICD-10-CM | POA: Diagnosis not present

## 2021-09-28 DIAGNOSIS — Z9181 History of falling: Secondary | ICD-10-CM | POA: Diagnosis not present

## 2021-09-28 DIAGNOSIS — Z9981 Dependence on supplemental oxygen: Secondary | ICD-10-CM | POA: Diagnosis not present

## 2021-09-28 DIAGNOSIS — R600 Localized edema: Secondary | ICD-10-CM | POA: Diagnosis not present

## 2021-09-28 DIAGNOSIS — R627 Adult failure to thrive: Secondary | ICD-10-CM | POA: Diagnosis not present

## 2021-09-28 DIAGNOSIS — Z7952 Long term (current) use of systemic steroids: Secondary | ICD-10-CM | POA: Diagnosis not present

## 2021-10-02 DIAGNOSIS — J9612 Chronic respiratory failure with hypercapnia: Secondary | ICD-10-CM | POA: Diagnosis not present

## 2021-10-02 DIAGNOSIS — R600 Localized edema: Secondary | ICD-10-CM | POA: Diagnosis not present

## 2021-10-02 DIAGNOSIS — Z7952 Long term (current) use of systemic steroids: Secondary | ICD-10-CM | POA: Diagnosis not present

## 2021-10-02 DIAGNOSIS — Z9981 Dependence on supplemental oxygen: Secondary | ICD-10-CM | POA: Diagnosis not present

## 2021-10-02 DIAGNOSIS — Z9181 History of falling: Secondary | ICD-10-CM | POA: Diagnosis not present

## 2021-10-02 DIAGNOSIS — R627 Adult failure to thrive: Secondary | ICD-10-CM | POA: Diagnosis not present

## 2021-10-03 DIAGNOSIS — R600 Localized edema: Secondary | ICD-10-CM | POA: Diagnosis not present

## 2021-10-03 DIAGNOSIS — Z9181 History of falling: Secondary | ICD-10-CM | POA: Diagnosis not present

## 2021-10-03 DIAGNOSIS — J9612 Chronic respiratory failure with hypercapnia: Secondary | ICD-10-CM | POA: Diagnosis not present

## 2021-10-03 DIAGNOSIS — R627 Adult failure to thrive: Secondary | ICD-10-CM | POA: Diagnosis not present

## 2021-10-03 DIAGNOSIS — Z9981 Dependence on supplemental oxygen: Secondary | ICD-10-CM | POA: Diagnosis not present

## 2021-10-03 DIAGNOSIS — Z7952 Long term (current) use of systemic steroids: Secondary | ICD-10-CM | POA: Diagnosis not present

## 2021-10-04 ENCOUNTER — Other Ambulatory Visit: Payer: Self-pay

## 2021-10-04 ENCOUNTER — Other Ambulatory Visit: Payer: Medicare Other

## 2021-10-04 DIAGNOSIS — Z9981 Dependence on supplemental oxygen: Secondary | ICD-10-CM | POA: Diagnosis not present

## 2021-10-04 DIAGNOSIS — Z9181 History of falling: Secondary | ICD-10-CM | POA: Diagnosis not present

## 2021-10-04 DIAGNOSIS — R600 Localized edema: Secondary | ICD-10-CM | POA: Diagnosis not present

## 2021-10-04 DIAGNOSIS — J9612 Chronic respiratory failure with hypercapnia: Secondary | ICD-10-CM | POA: Diagnosis not present

## 2021-10-04 DIAGNOSIS — Z7952 Long term (current) use of systemic steroids: Secondary | ICD-10-CM | POA: Diagnosis not present

## 2021-10-04 DIAGNOSIS — Z515 Encounter for palliative care: Secondary | ICD-10-CM

## 2021-10-04 DIAGNOSIS — R627 Adult failure to thrive: Secondary | ICD-10-CM | POA: Diagnosis not present

## 2021-10-04 NOTE — Progress Notes (Signed)
COMMUNITY PALLIATIVE CARE SW NOTE  PATIENT NAME: [name] DOB: 1932/01/10 MRN: [id number]  PRIMARY CARE PROVIDER: Denita Lung, MD  RESPONSIBLE PARTY:  Acct ID - Guarantor Home Phone Work Phone Relationship Acct Type  [id number] LENISE, JR* [phone]  Self P/F     [place], [place], [place] [address]     PLAN OF CARE and INTERVENTIONS:             GOALS OF CARE/ ADVANCE CARE PLANNING:  Patient is a DNR. MOST form completed. Daughter is HCPOA. Patients goal is to remain in her home as independent as possible.   2.         SOCIAL/EMOTIONAL/SPIRITUAL ASSESSMENT/ INTERVENTIONS:  SW met with patient in patients home for monthly palliative care visit. Patient resides in one level home alone.   Patient updated SW on current medical condition/changes. Patient seen by PCP since PC last visit. HH PT/OT services were started with [facility]. Currently they are visiting 3x.wk between both disciplines. Patient reports no pain at this time. Patient states that she continues to not sleep well at HS but does nap during the day. Patient's continues to have private care giver come in to assist with light housekeeping and grocery shopping.   Patient continues on prednisone 50m. Patient continues on 2.5LPM continuously. Patient continues with nebulizer treatments TID. No other medication changes. Patient continues to have issues with breathing, more during exertion. Patient continues to have some swelling in bilateral feet/ankles. Patient has compression stockings but has difficulty taking them off. SW suggested wearing compression on days she has therapy so that they can assist her with taking them off, otherwise she is encouraged to keep her feet elevated as much as possible.   No psychosocial needs at this time. Patients mood was calm and broad during visit. No S/s of depression or anxiety. Patient has adequate food and transportation. Patient shared that she is afraid to cook on her  stove as it is gas and she has to wear her O2 continuously. Caregiver picks up hot meals from [employer] patient 2x/wk, patient shares this is enough for her and denies the need for MOW's at this time.    Palliative care will continue to monitor and assist with long term care planning as needed.   3.         PATIENT/CAREGIVER EDUCATION/ COPING:  Patient A&O. Patient able to answer and address all questions appropriately. Patients mood was calm and broad during visit. No S/s of depression or anxiety. Patient continues to enjoy word puzzles and watching her bird feeders. Daughter is supportive.   4.         PERSONAL EMERGENCY PLAN:  Patient/family will call 9-1-1 for emergencies. Patient has life alert system.   5.         COMMUNITY RESOURCES COORDINATION/ HEALTH CARE NAVIGATION:  Patient manages all care. Patient has house cleaner Q Fri and caregiver 2x/wk for grocery shopping and other needs.   6.         FINANCIAL/LEGAL CONCERNS/INTERVENTIONS:  None         SOCIAL HX:  Social History   Tobacco Use   Smoking status: Former    Packs/day: 0.30    Years: 50.00    Pack years: 15.00    Types: Cigarettes    Quit date: 03/05/2013    Years since quitting: 8.5   Smokeless tobacco: Never  Substance Use Topics   Alcohol use: No    CODE  STATUS: DNR  ADVANCED DIRECTIVES: Y MOST FORM COMPLETE:  Y HOSPICE EDUCATION PROVIDED: N  PPS: Patient is independent with all ADL's at this time Patient is able to cook small things, but does not due to caregivers providing meals. Patient is A&O x3 with average insight and judgement.    Time spent: 40 min    [name], [place]

## 2021-10-09 DIAGNOSIS — J9612 Chronic respiratory failure with hypercapnia: Secondary | ICD-10-CM | POA: Diagnosis not present

## 2021-10-09 DIAGNOSIS — R627 Adult failure to thrive: Secondary | ICD-10-CM | POA: Diagnosis not present

## 2021-10-09 DIAGNOSIS — Z9181 History of falling: Secondary | ICD-10-CM | POA: Diagnosis not present

## 2021-10-09 DIAGNOSIS — R600 Localized edema: Secondary | ICD-10-CM | POA: Diagnosis not present

## 2021-10-09 DIAGNOSIS — Z7952 Long term (current) use of systemic steroids: Secondary | ICD-10-CM | POA: Diagnosis not present

## 2021-10-09 DIAGNOSIS — Z9981 Dependence on supplemental oxygen: Secondary | ICD-10-CM | POA: Diagnosis not present

## 2021-10-10 DIAGNOSIS — Z7952 Long term (current) use of systemic steroids: Secondary | ICD-10-CM | POA: Diagnosis not present

## 2021-10-10 DIAGNOSIS — R627 Adult failure to thrive: Secondary | ICD-10-CM | POA: Diagnosis not present

## 2021-10-10 DIAGNOSIS — Z9981 Dependence on supplemental oxygen: Secondary | ICD-10-CM | POA: Diagnosis not present

## 2021-10-10 DIAGNOSIS — R600 Localized edema: Secondary | ICD-10-CM | POA: Diagnosis not present

## 2021-10-10 DIAGNOSIS — Z9181 History of falling: Secondary | ICD-10-CM | POA: Diagnosis not present

## 2021-10-10 DIAGNOSIS — J9612 Chronic respiratory failure with hypercapnia: Secondary | ICD-10-CM | POA: Diagnosis not present

## 2021-10-11 DIAGNOSIS — R600 Localized edema: Secondary | ICD-10-CM | POA: Diagnosis not present

## 2021-10-11 DIAGNOSIS — J9612 Chronic respiratory failure with hypercapnia: Secondary | ICD-10-CM | POA: Diagnosis not present

## 2021-10-11 DIAGNOSIS — R627 Adult failure to thrive: Secondary | ICD-10-CM | POA: Diagnosis not present

## 2021-10-11 DIAGNOSIS — Z9181 History of falling: Secondary | ICD-10-CM | POA: Diagnosis not present

## 2021-10-11 DIAGNOSIS — Z9981 Dependence on supplemental oxygen: Secondary | ICD-10-CM | POA: Diagnosis not present

## 2021-10-11 DIAGNOSIS — Z7952 Long term (current) use of systemic steroids: Secondary | ICD-10-CM | POA: Diagnosis not present

## 2021-10-16 DIAGNOSIS — R600 Localized edema: Secondary | ICD-10-CM | POA: Diagnosis not present

## 2021-10-16 DIAGNOSIS — Z9181 History of falling: Secondary | ICD-10-CM | POA: Diagnosis not present

## 2021-10-16 DIAGNOSIS — J9612 Chronic respiratory failure with hypercapnia: Secondary | ICD-10-CM | POA: Diagnosis not present

## 2021-10-16 DIAGNOSIS — Z7952 Long term (current) use of systemic steroids: Secondary | ICD-10-CM | POA: Diagnosis not present

## 2021-10-16 DIAGNOSIS — Z9981 Dependence on supplemental oxygen: Secondary | ICD-10-CM | POA: Diagnosis not present

## 2021-10-16 DIAGNOSIS — R627 Adult failure to thrive: Secondary | ICD-10-CM | POA: Diagnosis not present

## 2021-10-17 DIAGNOSIS — R627 Adult failure to thrive: Secondary | ICD-10-CM | POA: Diagnosis not present

## 2021-10-17 DIAGNOSIS — Z7952 Long term (current) use of systemic steroids: Secondary | ICD-10-CM | POA: Diagnosis not present

## 2021-10-17 DIAGNOSIS — R600 Localized edema: Secondary | ICD-10-CM | POA: Diagnosis not present

## 2021-10-17 DIAGNOSIS — Z9981 Dependence on supplemental oxygen: Secondary | ICD-10-CM | POA: Diagnosis not present

## 2021-10-17 DIAGNOSIS — J9612 Chronic respiratory failure with hypercapnia: Secondary | ICD-10-CM | POA: Diagnosis not present

## 2021-10-17 DIAGNOSIS — Z9181 History of falling: Secondary | ICD-10-CM | POA: Diagnosis not present

## 2021-10-19 DIAGNOSIS — J9612 Chronic respiratory failure with hypercapnia: Secondary | ICD-10-CM | POA: Diagnosis not present

## 2021-10-19 DIAGNOSIS — R627 Adult failure to thrive: Secondary | ICD-10-CM | POA: Diagnosis not present

## 2021-10-19 DIAGNOSIS — Z9181 History of falling: Secondary | ICD-10-CM | POA: Diagnosis not present

## 2021-10-19 DIAGNOSIS — Z7952 Long term (current) use of systemic steroids: Secondary | ICD-10-CM | POA: Diagnosis not present

## 2021-10-19 DIAGNOSIS — R600 Localized edema: Secondary | ICD-10-CM | POA: Diagnosis not present

## 2021-10-19 DIAGNOSIS — Z9981 Dependence on supplemental oxygen: Secondary | ICD-10-CM | POA: Diagnosis not present

## 2021-10-21 DIAGNOSIS — Z7952 Long term (current) use of systemic steroids: Secondary | ICD-10-CM | POA: Diagnosis not present

## 2021-10-21 DIAGNOSIS — Z9981 Dependence on supplemental oxygen: Secondary | ICD-10-CM | POA: Diagnosis not present

## 2021-10-21 DIAGNOSIS — J9612 Chronic respiratory failure with hypercapnia: Secondary | ICD-10-CM | POA: Diagnosis not present

## 2021-10-21 DIAGNOSIS — Z9181 History of falling: Secondary | ICD-10-CM | POA: Diagnosis not present

## 2021-10-21 DIAGNOSIS — R600 Localized edema: Secondary | ICD-10-CM | POA: Diagnosis not present

## 2021-10-21 DIAGNOSIS — R627 Adult failure to thrive: Secondary | ICD-10-CM | POA: Diagnosis not present

## 2021-10-24 DIAGNOSIS — Z9981 Dependence on supplemental oxygen: Secondary | ICD-10-CM | POA: Diagnosis not present

## 2021-10-24 DIAGNOSIS — R600 Localized edema: Secondary | ICD-10-CM | POA: Diagnosis not present

## 2021-10-24 DIAGNOSIS — Z7952 Long term (current) use of systemic steroids: Secondary | ICD-10-CM | POA: Diagnosis not present

## 2021-10-24 DIAGNOSIS — Z9181 History of falling: Secondary | ICD-10-CM | POA: Diagnosis not present

## 2021-10-24 DIAGNOSIS — J9612 Chronic respiratory failure with hypercapnia: Secondary | ICD-10-CM | POA: Diagnosis not present

## 2021-10-24 DIAGNOSIS — R627 Adult failure to thrive: Secondary | ICD-10-CM | POA: Diagnosis not present

## 2021-10-25 DIAGNOSIS — R627 Adult failure to thrive: Secondary | ICD-10-CM | POA: Diagnosis not present

## 2021-10-25 DIAGNOSIS — Z9981 Dependence on supplemental oxygen: Secondary | ICD-10-CM | POA: Diagnosis not present

## 2021-10-25 DIAGNOSIS — J9612 Chronic respiratory failure with hypercapnia: Secondary | ICD-10-CM | POA: Diagnosis not present

## 2021-10-25 DIAGNOSIS — Z7952 Long term (current) use of systemic steroids: Secondary | ICD-10-CM | POA: Diagnosis not present

## 2021-10-25 DIAGNOSIS — Z9181 History of falling: Secondary | ICD-10-CM | POA: Diagnosis not present

## 2021-10-25 DIAGNOSIS — R600 Localized edema: Secondary | ICD-10-CM | POA: Diagnosis not present

## 2021-10-31 DIAGNOSIS — R600 Localized edema: Secondary | ICD-10-CM | POA: Diagnosis not present

## 2021-10-31 DIAGNOSIS — J9612 Chronic respiratory failure with hypercapnia: Secondary | ICD-10-CM | POA: Diagnosis not present

## 2021-10-31 DIAGNOSIS — Z9181 History of falling: Secondary | ICD-10-CM | POA: Diagnosis not present

## 2021-10-31 DIAGNOSIS — Z9981 Dependence on supplemental oxygen: Secondary | ICD-10-CM | POA: Diagnosis not present

## 2021-10-31 DIAGNOSIS — R627 Adult failure to thrive: Secondary | ICD-10-CM | POA: Diagnosis not present

## 2021-10-31 DIAGNOSIS — Z7952 Long term (current) use of systemic steroids: Secondary | ICD-10-CM | POA: Diagnosis not present

## 2021-11-01 ENCOUNTER — Other Ambulatory Visit: Payer: Medicare Other

## 2021-11-01 ENCOUNTER — Other Ambulatory Visit: Payer: Self-pay

## 2021-11-01 VITALS — BP 142/80 | HR 92 | Temp 97.2°F | Resp 32

## 2021-11-01 DIAGNOSIS — Z515 Encounter for palliative care: Secondary | ICD-10-CM

## 2021-11-01 NOTE — Progress Notes (Signed)
PATIENT NAME: ARLYCE CIRCLE DOB: [birth year] MRN: [id number]  PRIMARY CARE PROVIDER: Denita Lung, MD  RESPONSIBLE PARTY:  Acct ID - Guarantor Home Phone Work Phone Relationship Acct Type  [id number] SULEIMA, OHLENDORF* [phone]  Self P/F     [place], [place], [place] [address]    PLAN OF CARE and INTERVENTIONS:               1.  GOALS OF CARE/ ADVANCE CARE PLANNING:  Remain home with the assistance caregivers and daughter.                2.  PATIENT/CAREGIVER EDUCATION:  Albuterol               4. PERSONAL EMERGENCY PLAN:  Activate 911 for emergencies.                5.  DISEASE STATUS:  Joint visit completed with patient and [name], SW.  Respiratory:  Patient reports worsening shortness of breath last week.  She did take azithromycin and felt this improved her symptoms. Patient has not required azithromycin since the beginning of September.  Continues with productive cough with clear to white sputum.  Shortness of breath noted with minimal exertion.  Continues on 2.5 L of O2 via [place].  Encouraged to utilize albuterol nebulizer treatment for shortness of breath or wheezing.   Weakness:  Patient is continuing with therapy 1-2x weekly for strengthening.  She notes improvement in overall strength.  Edema:  Continues with trace edema to bilateral ankles.  Patient considered TED hose but has declined this as she is unable to take them off by herself.  Appetite:  Po intake continues to be good.  Patient is making light meals herself.  She does not believe there has been any changes in her weight.   Medication Management:  Refill called in for Azithromycin.   Patient denies need for any refills.  Daughter is assisting with medication management.   Phone call made to daughter [name] during this visit.  Update provided.  She stated the OT felt patient was back to her baseline of when he first started seeing her last month.  No other concerns noted at this time.  Daughter will be coming  in at the end of November.  Patient advised to contact PC if daughter would like a visit made while she is in town.   HISTORY OF PRESENT ILLNESS:  87 year old female with COPD.  Patient is being followed by Palliative Care monthly and PRN.   CODE STATUS: DNR ADVANCED DIRECTIVES: Yes MOST FORM: Yes PPS: 50%   PHYSICAL EXAM:   VITALS: Today's Vitals   11/01/21 1116  BP: (!) 142/80  Pulse: 92  Resp: (!) 32  Temp: (!) 97.2 F (36.2 C)  SpO2: 95%  PainSc: 0-No pain    LUNGS: clear to auscultation , decreased breath sounds CARDIAC: Cor RRR}  EXTREMITIES: Trace bilateral ankle edema SKIN: Skin color, texture, turgor normal. No rashes or lesions or normal  NEURO: positive for weakness       Lorenza Burton, RN

## 2021-11-01 NOTE — Progress Notes (Signed)
COMMUNITY PALLIATIVE CARE SW NOTE  PATIENT NAME: [name] DOB: 05-Mar-1932 MRN: [id number]  PRIMARY CARE PROVIDER: Denita Lung, MD  RESPONSIBLE PARTY:  Acct ID - Guarantor Home Phone Work Phone Relationship Acct Type  [id number] [name]* [phone]  Self P/F     [place], [place], [place] [address]     PLAN OF CARE and INTERVENTIONS:             GOALS OF CARE/ ADVANCE CARE PLANNING:  Patient is a DNR. MOST form completed. Daughter is HCPOA. Patients goal is to remain in her home as independent as possible.   2.         SOCIAL/EMOTIONAL/SPIRITUAL ASSESSMENT/ INTERVENTIONS:  SW and RN met with patient in patients home for monthly palliative care visit. Patient resides in one level home alone.   Patient updated SW and RN on current medical condition/changes. Patient shared that she had more difficulty breathing last week, but took a Z pack and is feeling better today. Patient reports no pain at this time. Patient states that she continues to not sleep well at HS but does nap during the day. Patient's continues to have private care giver come in to assist with light housekeeping and grocery shopping. Patient continues to receive [facility] PT/OT. No falls reported.   Patient continues on prednisone 61m. Patient continues on 2.5LPM continuously. Patient continues with nebulizer treatments TID. No other medication changes. Patient continues to have issues with breathing, more during exertion. Patient continues to have some swelling in bilateral feet/ankles. Patient has compression stockings but has difficulty taking them off. SW suggested wearing compression on days she has therapy so that they can assist her with taking them off, otherwise she is encouraged to keep her feet elevated as much as possible.  RN viewed and checked vitals. Refill for Z pack called into the pharmacy for patient. No other medication needs.    No psychosocial needs at this time. Patients mood was calm and  broad during visit. No S/s of depression or anxiety. Patient has adequate food and transportation. Patient shared that she is afraid to cook on her stove as it is gas and she has to wear her O2 continuously. Caregiver picks up hot meals from [employer] patient 2x/wk, patient shares this is enough for her and denies the need for MOW's at this time.    Palliative care will continue to monitor and assist with long term care planning as needed.   3.         PATIENT/CAREGIVER EDUCATION/ COPING:  Patient A&O. Patient able to answer and address all questions appropriately. Patients mood was calm and broad during visit. No S/s of depression or anxiety. Patient continues to enjoy word puzzles and watching her bird feeders. Daughter is supportive.   4.         PERSONAL EMERGENCY PLAN:  Patient/family will call 9-1-1 for emergencies. Patient has life alert system.   5.         COMMUNITY RESOURCES COORDINATION/ HEALTH CARE NAVIGATION:  Patient manages all care. Patient has house cleaner Q Fri and caregiver 2x/wk for grocery shopping and other needs.   6.         FINANCIAL/LEGAL CONCERNS/INTERVENTIONS:  None         SOCIAL HX:  Social History   Tobacco Use   Smoking status: Former    Packs/day: 0.30    Years: 50.00    Pack years: 15.00    Types: Cigarettes  Quit date: 03/05/2013    Years since quitting: 8.6   Smokeless tobacco: Never  Substance Use Topics   Alcohol use: No    CODE STATUS: DNR  ADVANCED DIRECTIVES: Y MOST FORM COMPLETE:  Y HOSPICE EDUCATION PROVIDED: N  YOV:ZCHYIFO is independent with all ADL's at this time Patient is able to cook small things, but does not due to caregivers providing meals. Patient is A&O x3 with average insight and judgement.   Time spent: 40 min       [name], [place]

## 2021-11-02 DIAGNOSIS — Z7952 Long term (current) use of systemic steroids: Secondary | ICD-10-CM | POA: Diagnosis not present

## 2021-11-02 DIAGNOSIS — R600 Localized edema: Secondary | ICD-10-CM | POA: Diagnosis not present

## 2021-11-02 DIAGNOSIS — Z9981 Dependence on supplemental oxygen: Secondary | ICD-10-CM | POA: Diagnosis not present

## 2021-11-02 DIAGNOSIS — J9612 Chronic respiratory failure with hypercapnia: Secondary | ICD-10-CM | POA: Diagnosis not present

## 2021-11-02 DIAGNOSIS — R627 Adult failure to thrive: Secondary | ICD-10-CM | POA: Diagnosis not present

## 2021-11-02 DIAGNOSIS — Z9181 History of falling: Secondary | ICD-10-CM | POA: Diagnosis not present

## 2021-11-07 ENCOUNTER — Ambulatory Visit (INDEPENDENT_AMBULATORY_CARE_PROVIDER_SITE_OTHER): Payer: Medicare Other | Admitting: Podiatry

## 2021-11-07 ENCOUNTER — Other Ambulatory Visit: Payer: Self-pay

## 2021-11-07 ENCOUNTER — Encounter: Payer: Self-pay | Admitting: Podiatry

## 2021-11-07 DIAGNOSIS — J9612 Chronic respiratory failure with hypercapnia: Secondary | ICD-10-CM | POA: Diagnosis not present

## 2021-11-07 DIAGNOSIS — B351 Tinea unguium: Secondary | ICD-10-CM

## 2021-11-07 DIAGNOSIS — M79676 Pain in unspecified toe(s): Secondary | ICD-10-CM

## 2021-11-07 DIAGNOSIS — R627 Adult failure to thrive: Secondary | ICD-10-CM | POA: Diagnosis not present

## 2021-11-07 DIAGNOSIS — N289 Disorder of kidney and ureter, unspecified: Secondary | ICD-10-CM

## 2021-11-07 DIAGNOSIS — R600 Localized edema: Secondary | ICD-10-CM | POA: Diagnosis not present

## 2021-11-07 DIAGNOSIS — Z9181 History of falling: Secondary | ICD-10-CM | POA: Diagnosis not present

## 2021-11-07 DIAGNOSIS — Z7952 Long term (current) use of systemic steroids: Secondary | ICD-10-CM | POA: Diagnosis not present

## 2021-11-07 DIAGNOSIS — Z9981 Dependence on supplemental oxygen: Secondary | ICD-10-CM | POA: Diagnosis not present

## 2021-11-07 NOTE — Progress Notes (Signed)
This patient returns to my office for at risk foot care.  This patient requires this care by a professional since this patient will be at risk due to having  renal insufficiency.  This patient is unable to cut nails herself since the patient cannot reach her nails.These nails are painful walking and wearing shoes.  This patient presents for at risk foot care today.    General Appearance  Alert, conversant and in no acute stress.  Vascular  Dorsalis pedis and posterior tibial  pulses are weakly  palpable  bilaterally.  Capillary return is within normal limits  Bilaterally. Cold feet. Bilaterally.  Absent digital hair  B/L.  Neurologic  Senn-Weinstein monofilament wire test diminished   bilaterally. Muscle power within normal limits bilaterally.  Nails Thick disfigured discolored nails with subungual debris  from hallux to fifth toes bilaterally. No evidence of bacterial infection or drainage bilaterally.  Orthopedic  No limitations of motion  feet .  No crepitus or effusions noted.  No bony pathology or digital deformities noted.  Skin  normotropic skin with no porokeratosis noted bilaterally.  No signs of infections or ulcers noted.   Brown discoloration dorsum left foot is fading.  Onychomycosis  Pain in right toes  Pain in left toes  Consent was obtained for treatment procedures.   Mechanical debridement of nails 1-5  bilaterally performed with a nail nipper.  Filed with dremel without incident.    Return office visit   3 months                   Told patient to return for periodic foot care and evaluation due to potential at risk complications.   Gardiner Barefoot DPM

## 2021-11-08 DIAGNOSIS — J9612 Chronic respiratory failure with hypercapnia: Secondary | ICD-10-CM | POA: Diagnosis not present

## 2021-11-08 DIAGNOSIS — R600 Localized edema: Secondary | ICD-10-CM | POA: Diagnosis not present

## 2021-11-08 DIAGNOSIS — Z9181 History of falling: Secondary | ICD-10-CM | POA: Diagnosis not present

## 2021-11-08 DIAGNOSIS — Z9981 Dependence on supplemental oxygen: Secondary | ICD-10-CM | POA: Diagnosis not present

## 2021-11-08 DIAGNOSIS — R627 Adult failure to thrive: Secondary | ICD-10-CM | POA: Diagnosis not present

## 2021-11-08 DIAGNOSIS — Z7952 Long term (current) use of systemic steroids: Secondary | ICD-10-CM | POA: Diagnosis not present

## 2021-11-14 DIAGNOSIS — Z7952 Long term (current) use of systemic steroids: Secondary | ICD-10-CM | POA: Diagnosis not present

## 2021-11-14 DIAGNOSIS — Z9181 History of falling: Secondary | ICD-10-CM | POA: Diagnosis not present

## 2021-11-14 DIAGNOSIS — R627 Adult failure to thrive: Secondary | ICD-10-CM | POA: Diagnosis not present

## 2021-11-14 DIAGNOSIS — R600 Localized edema: Secondary | ICD-10-CM | POA: Diagnosis not present

## 2021-11-14 DIAGNOSIS — J9612 Chronic respiratory failure with hypercapnia: Secondary | ICD-10-CM | POA: Diagnosis not present

## 2021-11-14 DIAGNOSIS — Z9981 Dependence on supplemental oxygen: Secondary | ICD-10-CM | POA: Diagnosis not present

## 2021-11-20 ENCOUNTER — Telehealth: Payer: Self-pay

## 2021-11-20 ENCOUNTER — Other Ambulatory Visit: Payer: Self-pay | Admitting: Internal Medicine

## 2021-11-20 NOTE — Telephone Encounter (Signed)
456 pm.  Message received from patient.  She will need a refill on prednisone 10 mg po daily.  She will be out of this medication before her next follow up visit with pulmonology.  Return call made to patient to advise message was received.  I will contact [facility] at [facility] regarding a refill.   509 pm. Phone call made to [facility] to advise of refill need.  No refills currently in place.  Pharmacy has sent a refill request to Dr. Gustavus Bryant office.

## 2021-11-22 ENCOUNTER — Telehealth: Payer: Self-pay | Admitting: Internal Medicine

## 2021-11-22 ENCOUNTER — Telehealth: Payer: Self-pay

## 2021-11-22 NOTE — Telephone Encounter (Signed)
925 am.  Incoming call from [name]-daughter.  Prednisone has not been refilled as of today.  Daughter requesting follow up.  Phone call made to Dr. Melvyn Novas office to follow up.  Apolonio Schneiders will notify nurse but it appears the order may have been sent over on Monday.  I will follow up with [facility] to ensure medication refill is completed.   936 am.  Phone made to [facility] to follow up on prednisone script.   Medication is ready for pick up.   Notified daughter [name] of medication being ready.

## 2021-11-22 NOTE — Telephone Encounter (Signed)
Call made to pharmacy, placed on hold for approx 20 minutes.   Call made to patient, confirmed DOB. She has picked up the prednisone.   No number listed for [facility].   Nothing further needed at this time.

## 2021-11-23 ENCOUNTER — Other Ambulatory Visit: Payer: Self-pay | Admitting: Family Medicine

## 2021-11-23 NOTE — Telephone Encounter (Signed)
[facility] is requesting to fill pt albuterol. Please advise. kh

## 2021-11-25 IMAGING — DX DG CHEST 2V
2 series · 2 of 2 positions shown · non-contrast
Comparison: Chest x-ray 01/17/2021.

CLINICAL DATA: 88-year-old female with history of COPD presenting
with cough.

EXAM:
CHEST - 2 VIEW

[chest pa]
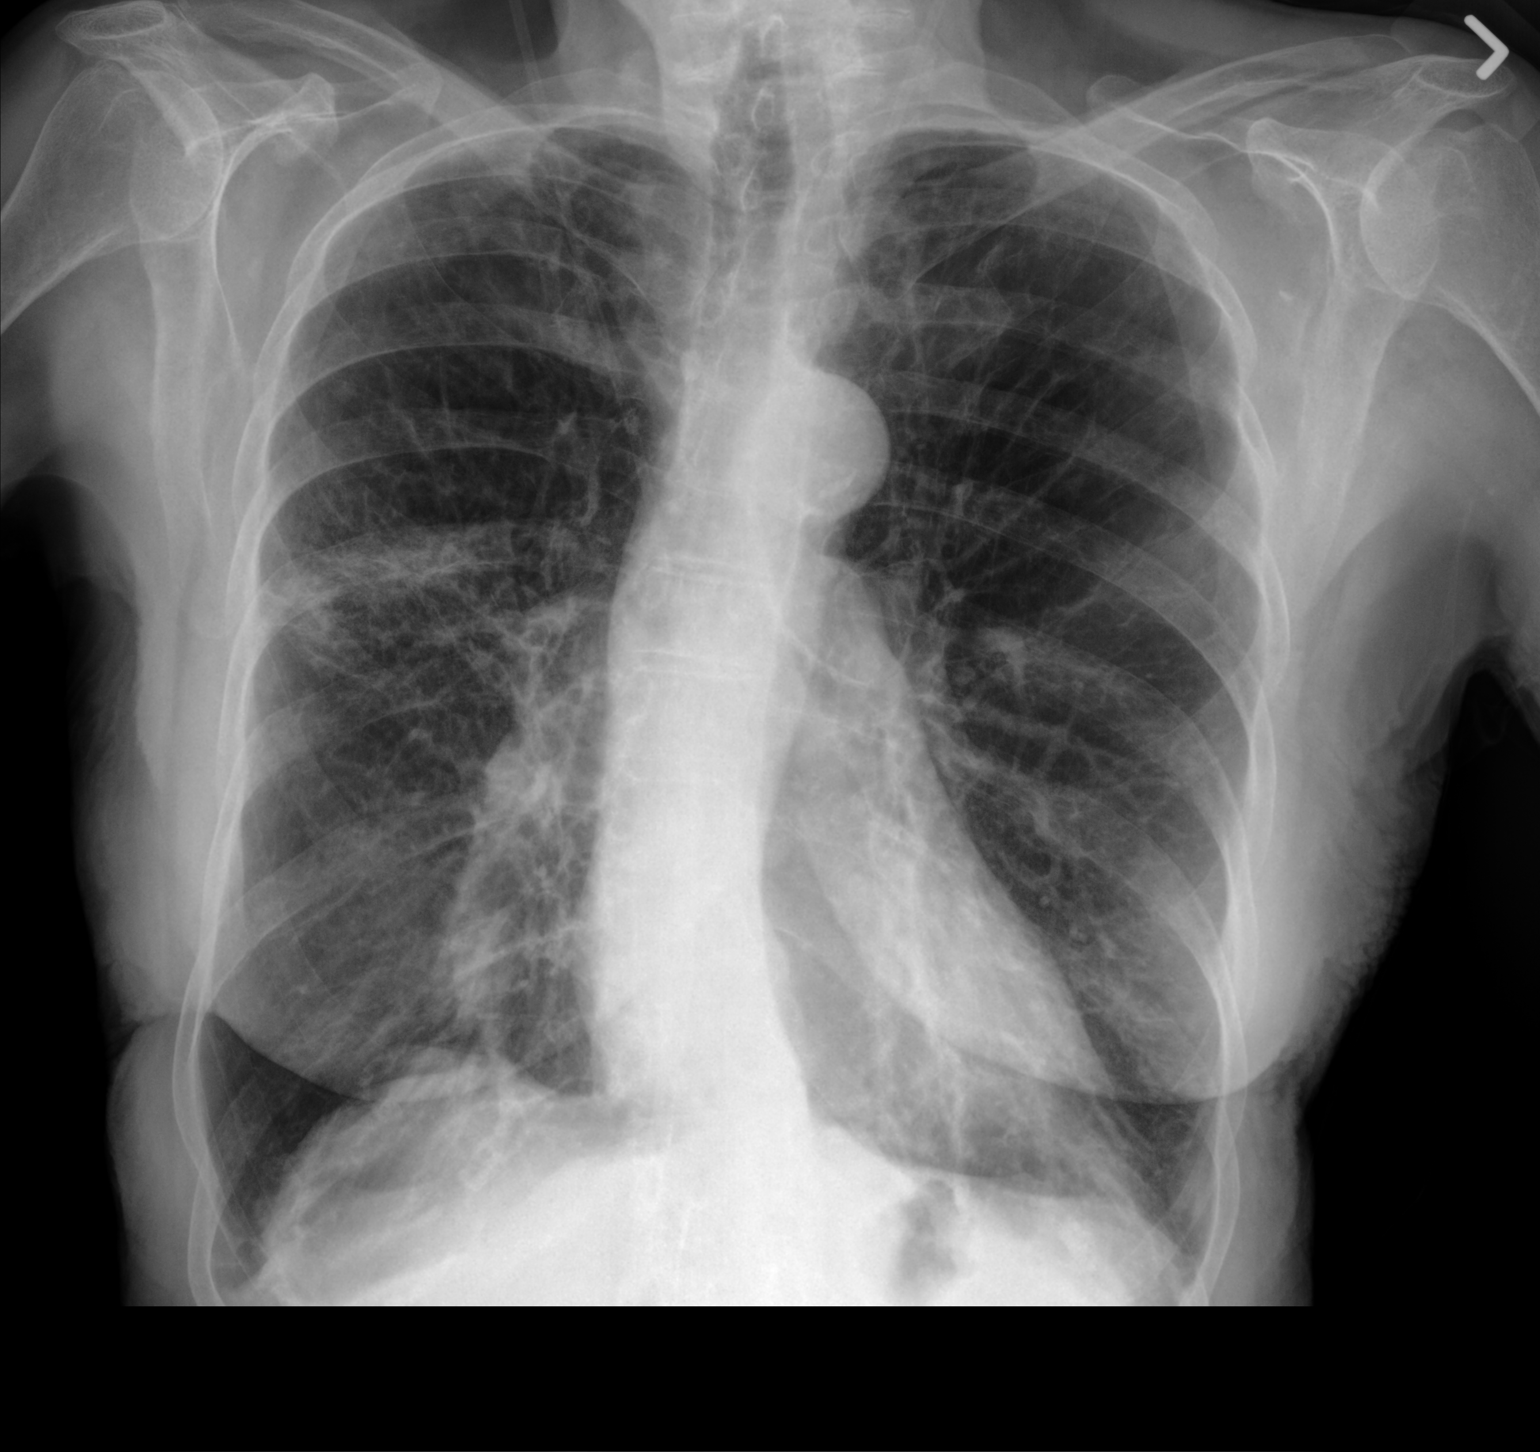

[chest lat]
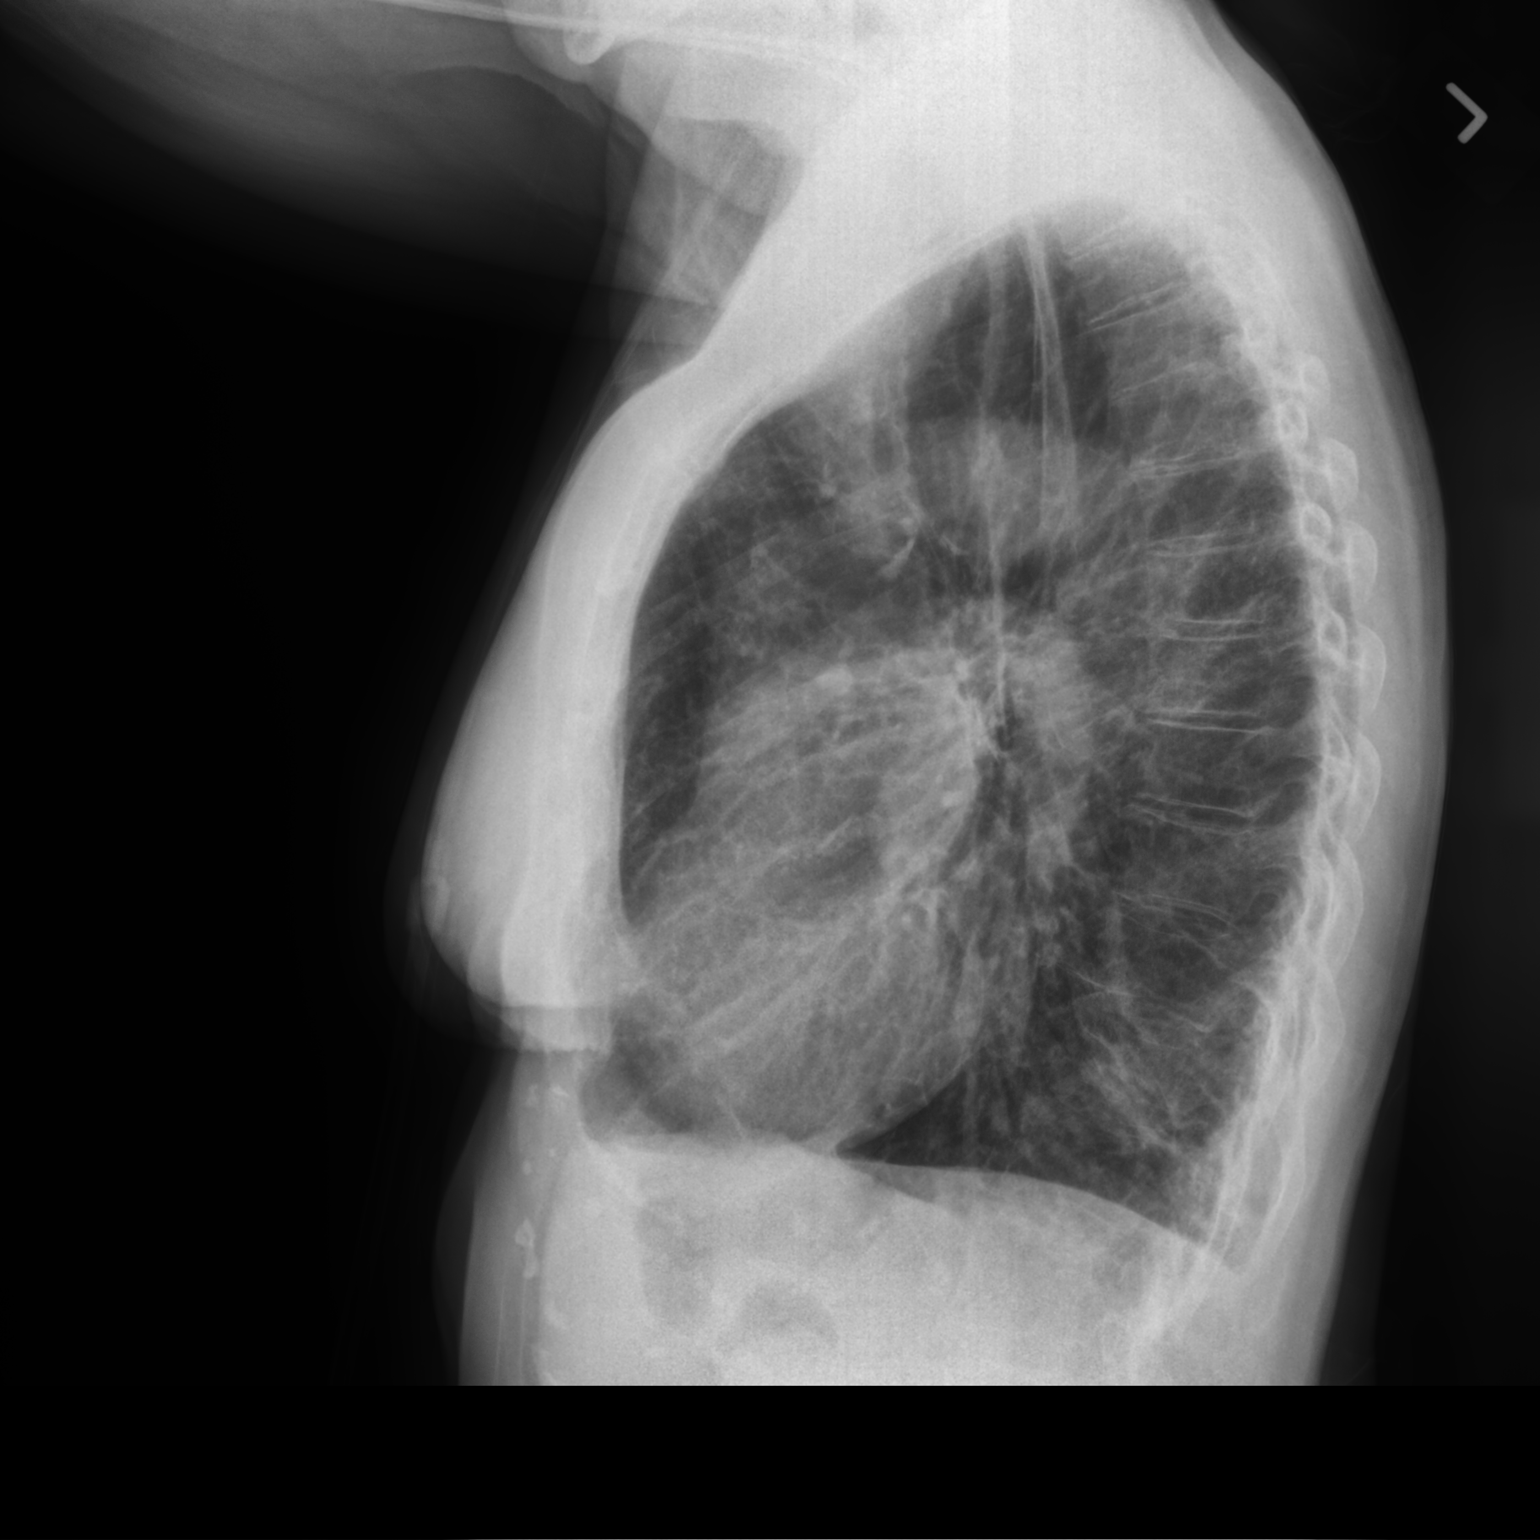

[2 of 2 positions shown; findings below may reference images not displayed]

FINDINGS: Hyperinflation of the lungs with emphysematous changes again noted.
Persistent ill-defined opacity in the right mid lung, similar to the
recent chest radiographs, corresponding to areas of consolidation
noted on prior chest CT from July 2020, likely to reflect evolving
post infectious or inflammatory scarring. No new areas of airspace
consolidation are noted. No pneumothorax. No pleural effusions. No
evidence of pulmonary edema. Heart size is normal. Upper mediastinal
contours are within normal limits. Aortic atherosclerosis.
IMPRESSION: 1. Probable area of chronic post infectious or inflammatory scarring
in the right mid lung. Given the lack of resolution, if there is
clinical concern for underlying malignancy, follow-up contrast
enhanced chest CT would be recommended in the near future to better
evaluate these findings.
2. Emphysema.
3. Aortic atherosclerosis.

## 2021-12-04 ENCOUNTER — Other Ambulatory Visit: Payer: Self-pay

## 2021-12-04 ENCOUNTER — Other Ambulatory Visit: Payer: Medicare Other

## 2021-12-04 VITALS — BP 140/90 | HR 108 | Temp 97.3°F | Resp 40

## 2021-12-04 DIAGNOSIS — Z515 Encounter for palliative care: Secondary | ICD-10-CM

## 2021-12-04 NOTE — Progress Notes (Signed)
PATIENT NAME: AZHAR YOGI DOB: [birth year] MRN: [id number]  PRIMARY CARE PROVIDER: Denita Lung, MD  RESPONSIBLE PARTY:  Acct ID - Guarantor Home Phone Work Phone Relationship Acct Type  [id number] COOKIE, [phone]  Self P/F     [place], [place], [place] [address]    PLAN OF CARE and INTERVENTIONS:               1.  GOALS OF CARE/ ADVANCE CARE PLANNING:  Patient desires to remain in her home with assistance for as long as possible.               2.  PATIENT/CAREGIVER EDUCATION:  Podiatry that makes home visits.  Re-enforced using albuterol for shortness of breath.                4. PERSONAL EMERGENCY PLAN:  Activate 911 for emergencies.               5.  DISEASE STATUS:  Respiratory:  Patient very short of breath with exertion.  O2 sats dropped to 89% on 2.5 L of oxygen via [place].  Takes about 10 minutes to recover and O2 sats are in the upper 90's.  Pursed lip breathing noted.Patient requested pulse ox be checked with mine as there was concern readings were off on hers.  Noted 1 point off with heart rate and pulse ox reading.    No use of antibiotics since my last visit.  Continues on prednisone 10 mg daily.  Patient is now using her albuterol nebulizer when needed.   Podiatry:  Patient states her daughter is asking about a home based podiatry company.  Information for [facility] out of [place], [place].  Patient states her next appointment is in February.  Pain:  Patient reports some discomfort to her right rib cage region.  She reached across her bed and believes she may have stretched her muscles.  She has tylenol in the home and will try this as needed.   Edema:  1+ bilateral lower extremity edema. No worse per patient.  Unable to use ted hose as she is unable to put them on or take them off.  She is elevating her feet daily for about 1 hour when she takes an afternoon nap.    Medication:  No refills needed at this time.    355 pm.  Phone call made Lurena Joiner to follow up on my visit today.  No answer but message has been left on her cell phone.     HISTORY OF PRESENT ILLNESS:  87 year old female with COPD.  Patient is being followed by Palliative Care monthly and PRN.   CODE STATUS: DNR ADVANCED DIRECTIVES: Yes MOST FORM: Yes PPS: 50%   PHYSICAL EXAM:   VITALS: Today's Vitals   12/04/21 1202  BP: 140/90  Pulse: (!) 108  Resp: (!) 40  Temp: (!) 97.3 F (36.3 C)  SpO2: 91%  PainSc: 2   PainLoc: Chest    LUNGS: decreased breath sounds, tachypnea CARDIAC: Cor Tachy}  EXTREMITIES: 1+ lower extremity edema bilaterally SKIN: Skin color, texture, turgor normal. No rashes or lesions or normal  NEURO: alert and oriented x 4.        Lorenza Burton, RN

## 2021-12-08 ENCOUNTER — Telehealth: Payer: Self-pay

## 2021-12-08 NOTE — Telephone Encounter (Signed)
[name] with Pallative care called requesting order to have podiatrist visit home for routine exam of feet.  Verbal given informed if anything further needed we would call and let her know at her number [phone]

## 2021-12-08 NOTE — Telephone Encounter (Signed)
821 am.  [name]-daughter would like to pursue home podiatry with [facility].  Phone call made to PCP office and spoke with Christin Edison Pace, LPN and orders obtained for podiatry consult.  Paper work completed and sent to [facility].

## 2021-12-14 ENCOUNTER — Telehealth: Payer: Self-pay

## 2021-12-14 NOTE — Telephone Encounter (Signed)
826 am.  Phone call made to [facility] to ensure all documents were received.  Information re-faxed as requested and received fax from [facility] confirming all information was received.  Patient to be scheduled for a home podiatry visit in February.

## 2021-12-26 ENCOUNTER — Other Ambulatory Visit: Payer: Self-pay

## 2021-12-26 ENCOUNTER — Other Ambulatory Visit: Payer: Medicare Other

## 2021-12-26 ENCOUNTER — Telehealth: Payer: Self-pay | Admitting: Family Medicine

## 2021-12-26 VITALS — BP 132/70 | HR 100 | Temp 97.9°F | Resp 28

## 2021-12-26 DIAGNOSIS — Z515 Encounter for palliative care: Secondary | ICD-10-CM

## 2021-12-26 MED ORDER — ALPRAZOLAM 0.25 MG PO TABS: 0.2500 mg | ORAL_TABLET | Freq: Two times a day (BID) | ORAL | 0 refills | Status: DC | PRN

## 2021-12-26 NOTE — Telephone Encounter (Signed)
[name] with [name] called  [phone]     She said patient had xanax order from year ago but never used it. She is now having some issues with anxiety and wants to know if they can get rx xanax to use PRN  [facility]  Please notify [facility]

## 2021-12-26 NOTE — Progress Notes (Signed)
PATIENT NAME: [name] DOB: Sep 27, 1932 MRN: [id number]  PRIMARY CARE PROVIDER: Denita Lung, MD  RESPONSIBLE PARTY:  Acct ID - Guarantor Home Phone Work Phone Relationship Acct Type  [id number] [name], [name] [phone]  Self P/F     [place], [place], [place] [address]    PLAN OF CARE and INTERVENTIONS:               1.  GOALS OF CARE/ ADVANCE CARE PLANNING:  DNR.  Patient does not wish to return to the hospital for any type of treatments.                2.  PATIENT/CAREGIVER EDUCATION:  Hydration. Safety. Anxiety.  Hospital vs hospice.                4. PERSONAL EMERGENCY PLAN: Activate 911 for emergencies.               5.  DISEASE STATUS:  Joint visit with patient and [name].  [name] left a message stating patient had a change in condition yesterday, requesting  a visit today and questioning if hospice needs to be called. She is on the way to [facility] now from [place].   Increase weakness, shortness of breath,decrease appetite,  palpations and racing heart per patient. Daughter encouraged patient to go to the hospital but she has declined this.   She has started on her z-pack and is utilizing her albuterol nebs and routine nebs.    Anxiety:  Patient notes her anxiety was elevated more so yesterday when she was feeling poorly.  She does not have any anti-anxiety medication in the home.  Patient   previously had xanax after a hospitalization in 2021 but never utilized.  Education provided on the use of anti-anxiety medications. 148 pm.  Phone call made to PCP office with request for PRN xanax.   Anorexia:  Patient endorses a poor appetite that began yesterday.   She does not recall eating anything and her fluid intake has been poor.  Today, she has only taken bites of an egg and a cracker.  Currently working on a cup of coffee and has a small bottle of water she is sipping on.  Discussion completed on hydration.   Last documented weight was 09/18/21 at 145 lbs.   Weight obtained today and she is 142 lbs.  BMI is 22.2  Edema:  2 + bilateral pedal edema present.  Patient denies abdominal fullness.  Does not feel her breathing is worse than yesterday.  Patient notes she is sleeping with a wedge pillow but this is not new for her.     Mobility:  Patient denies any recent falls.  She notes increase in fatigue yesterday and was uncertain she would make it to the kitchen to take her medications.  Today, her sister is present and has been attending to her needs.   Patient is unable to complete tasks without breaks. Breaks are typically 20-30 minutes.  She notes toileting in the am, returning to bed to complete her breathing treatment, resting, going back to the bathroom to dress and then returning to her bed to rest again.  When patient is not in the kitchen sitting,  she is laying in the bed resting.  Gait somewhat unsteady.  Not using assistive devices but utilizing the wall and furniture to keep her balance steady.  Patient endorses having 2 rolling walkers, one that has a seat.  We discussed safety and utilizing her  rolling walker with the seat.  This would allow patient to rest 1/2 to the kitchen if needed and provide a better stability.   Podiatry:  Phone call made to [facility] to follow up on scheduling and appointment.   Patient is on the rotation to be seen next month but a date has not yet been established.  Phone number for patient and her [name] provided and advised it would be best to contact her daughter.    Respiratory:  Booker sounds are diminished bilaterally.  Coughing and white sputum production is reported yesterday.  She continues with O2 at 2.5 L via [place].  O2 saturations are ranging from 90-93% at rest.  She notes sats dropping to the mid to upper 80's with exertion but she recovers after 10-15 minutes. Albuterol nebulizer utilized 2 x yesterday and none today.  Pursed lip breathing at rest.   [name] arrived at the home  around 1140 am.  Updated on my visit and discussion.  Advised that I did not discuss hospice with patient as I was waiting for her arrival.  Discussion completed with daughter and patient regarding hospice services.  Patient is reluctant to accept.  Advised she did not need to make a decision today.  Patient and daughter agreeable for me to call PCP with xanax request.  Daughter aware she can contact me directly should they decide to pursue hospice services.     HISTORY OF PRESENT ILLNESS:  88 year old female with COPD.  Patient is being followed by Palliative Care monthly and PRN.  CODE STATUS: DNR ADVANCED DIRECTIVES: Yes MOST FORM: Yes PPS: 50%   PHYSICAL EXAM:   VITALS: Today's Vitals   12/26/21 0944  BP: 132/70  Pulse: 100  Resp: (!) 28  Temp: 97.9 F (36.6 C)  SpO2: 90%  PainSc: 0-No pain    LUNGS: positive findings: tachypnea CARDIAC: Cor RRR}  EXTREMITIES: 2+ bilateral pedal edema.  SKIN: Skin color, texture, turgor normal. No rashes or lesions or normal  NEURO: unsteady gait  Time 945 am-1155 am.      Lorenza Burton, RN

## 2021-12-28 ENCOUNTER — Ambulatory Visit: Payer: Medicare Other | Admitting: Internal Medicine

## 2022-01-01 ENCOUNTER — Other Ambulatory Visit: Payer: Medicare Other

## 2022-01-01 ENCOUNTER — Other Ambulatory Visit: Payer: Self-pay

## 2022-01-01 VITALS — BP 154/80 | HR 91 | Temp 97.7°F

## 2022-01-01 DIAGNOSIS — Z515 Encounter for palliative care: Secondary | ICD-10-CM

## 2022-01-01 NOTE — Progress Notes (Signed)
PATIENT NAME: [name] DOB: 03-17-1932 MRN: [id number]  PRIMARY CARE PROVIDER: Denita Lung, MD  RESPONSIBLE PARTY:  Acct ID - Guarantor Home Phone Work Phone Relationship Acct Type  [id number] Debra, [phone]  Self P/F     [place], [place], [place] [address]    PLAN OF CARE and INTERVENTIONS:               1.  GOALS OF CARE/ ADVANCE CARE PLANNING:  Remain home and independent for as long as possible               2.  PATIENT/CAREGIVER EDUCATION:  Hospice and Private home care.               4. PERSONAL EMERGENCY PLAN:  Activate 911 for emergencies.               5.  DISEASE STATUS:  Joint visit completed with [name], [place] and patient.  Respiratory:  Patient has completed antibiotics.  Breathing has improved since last week.  Daughter noted cough was more productive by day 3 of antibiotic use. Sputum production remains clear.  O2 sats ranging 93-94% on 3 L via [place].  Patient endorses shortness of breath with minimal exertion.   Mobility:  Patient has not yet returned to her baseline.  She has a walker in the home but is not currently using.  Daughter notes patient is resting in bed more often due to weakness.  No falls since my last visit.  Hospice:  Patient does not wish to pursue hospice.  We discussed the misconceptions of hospice and past experiences she has had.  Patient's spouse was under hospice but went to the hospice home for end of life care. Discussed at length, patient's concerns regarding use of medications.  Patient continues to decline hospice support.  Private Caregivers:  Discussed the use of hired caregivers for additional support as daughter lives out of state.   Patient is reluctant to have services as she does not wish to have someone watching her all the time.  She is agreeable to have someone come in for a couple of hours in the am and pm.  Daughter has resources she will check into to get this established.    HISTORY OF  PRESENT ILLNESS:  88 year old female with COPD.  Patient is being followed by Palliative Care monthly and PRN.   CODE STATUS: DNR ADVANCED DIRECTIVES: None on file MOST FORM: Yes PPS: 50%   PHYSICAL EXAM:   VITALS: Today's Vitals   01/01/22 1228  BP: (!) 154/80  Pulse: 91  Temp: 97.7 F (36.5 C)  SpO2: 93%  PainSc: 0-No pain    LUNGS: decreased breath sounds CARDIAC: Cor RRR}  EXTREMITIES: trace edema SKIN: Skin color, texture, turgor normal. No rashes or lesions or normal  NEURO: positive for weakness       Lorenza Burton, RN

## 2022-01-01 NOTE — Progress Notes (Signed)
COMMUNITY PALLIATIVE CARE SW NOTE  PATIENT NAME: [name] DOB: 06/30/1932 MRN: [id number]  PRIMARY CARE PROVIDER: Denita Lung, MD  RESPONSIBLE PARTY:  Acct ID - Guarantor Home Phone Work Phone Relationship Acct Type  [id number] SHADDAI, SHAPLEY* [phone]  Self P/F     [place], [place], [place] [address]     PLAN OF CARE and INTERVENTIONS:             1. GOALS OF CARE/ ADVANCE CARE PLANNING: Goals include to maximize quality of life and symptom management. Our advance care planning conversation included a discussion about:    The value and importance of advance care planning  Review and updating or creation of an advance directive document.   Patient is a DNR. MOST form completed. HCPOA is daughter, Kayleen Memos.    2.  Palliative care encounter: Palliative medicine team will continue to support patient, patient's family, and medical team. Visit consisted of counseling and education dealing with the complex and emotionally intense issues of symptom management and palliative care in the setting of serious and potentially life-threatening illness  SW and RN met with patient and daughter Kayleen Memos, in patients home for monthly palliative care visit. Patient resides in a one story home.   Functional changes/updates: Patient has had a slight functional decline over the past few weeks. Recently last week patient presented with Increase weakness, shortness of breath, decrease appetite, palpations and racing heart per patient. Today patient is not at baseline but feeling bit better than last week. Patient continues to have weakness and requiring more assistance around the home than before. No falls reported. Patient has RW but does not use.  Home & Environment assessment: No concerns.  Protein calorie malnutrition/weight loss/Appetite: Patient continues with fair-poor appetite.  Family share patient is sleeping more. Up to 6 additional hours during the day. Family share that lately  patient stays awake normally for meals and naps the rest of the day. Patient napping/sleeping during most of todays visit.  Psychosocial assessment: completed. No additional physical needs identified at this time. SW discussed additional caregiver needs with patient and daughter. Patient expressed disintrest in obtaining additional caregivers but is agreeable to the service due to her recent decline. SW explained caregiver agencies vs private caregivers. Patient's daughter has contacts for personal caregivers and will outreach them to discuss patient needs. Patients daughter is in town with no return date as yet and assisting patient with household task and ADL's until additional caregiver plan is in place. Ongoing support/resources will continued to be offered if needed. Military hx: N/A   Medical assessment: RN reviewed medications and took vitals. RN and SW provided education around potential life threatening illness and symptom management. No medication needs at this time.  Hospice discussion: Hospice services discussed in detail with patient and daughter, as patient has exhibited an overall decline since last PC visit. Patient is not receptive to hospice services at this time.   SW discussed goals, reviewed care plan, provided emotional support, used active and reflective listening in the form of reciprocity emotional response. Questions and concerns were addressed. The patient/family was encouraged to call with any additional questions and/or concerns. PC Provided general support and encouragement, no other unmet needs identified. Will continue to follow.  3.         PATIENT/CAREGIVER EDUCATION/ COPING:   Appearance: well groomed, appropriate given situation  Mental Status: Alert/oriented x3.  Eye Contact: fair. Patients eyes closed majority of visit, unless addressed directly.  Thought Process:  rational  Thought Content: not assessed  Speech: Normal rate, volume, tone  Mood: Normal and  calm Affect: Congruent to endorsed mood, full ranging Insight: normal Judgement: normal  Interaction Style: Cooperative  Patient A&O x3, able to simple conversation but is less engaging during this visit. At baseline patient is HOH. Patient denies depression and anxiety, patient has new refill of Xanax that she has not utilized. PHQ 9 not completed. Patients family is supportive.   4.         PERSONAL EMERGENCY PLAN:  Patient/family will call 9-1-1 for emergencies. Patient has life alert system.  5.         COMMUNITY RESOURCES COORDINATION/ HEALTH CARE NAVIGATION:  daughter manages his care.  6.  FINANCIAL CONCERNS/NEEDS: None.   Primary Health Insurance: Medicare Secondary Health Insurance:  Prescription Coverage: Yes, no history of difficulty obtaining or affording prescriptions reported     SOCIAL HX:  Social History   Tobacco Use   Smoking status: Former    Packs/day: 0.30    Years: 50.00    Pack years: 15.00    Types: Cigarettes    Quit date: 03/05/2013    Years since quitting: 8.8   Smokeless tobacco: Never  Substance Use Topics   Alcohol use: No    CODE STATUS: DNR ADVANCED DIRECTIVES: Y MOST FORM COMPLETE:  Y HOSPICE EDUCATION PROVIDED: Y  PPS: Patient isSUP A with all ADL's at this time. Patient is A&O x3 with average-fair insight and judgement.    Time spent: 45 min   [place], [place]

## 2022-01-08 ENCOUNTER — Telehealth: Payer: Self-pay | Admitting: Internal Medicine

## 2022-01-08 NOTE — Telephone Encounter (Signed)
Called and spoke with pt's daughter [name] who was wanting to know if pt's appt could be changed to a mychart visit and also be moved up sooner as she is currently in town this week with pt. Stated to her that he did have a cancellation tomorrow 1/17 at 1:30 that we could move pt's appt up to and she said that would be great. Also changed pt's appt to mychart visit and put note that we would need to send link to [name]'s phone for the visit. Nothing further needed.

## 2022-01-09 ENCOUNTER — Encounter: Payer: Self-pay | Admitting: Internal Medicine

## 2022-01-09 ENCOUNTER — Telehealth (INDEPENDENT_AMBULATORY_CARE_PROVIDER_SITE_OTHER): Payer: Medicare Other | Admitting: Internal Medicine

## 2022-01-09 ENCOUNTER — Other Ambulatory Visit: Payer: Self-pay

## 2022-01-09 DIAGNOSIS — J9612 Chronic respiratory failure with hypercapnia: Secondary | ICD-10-CM

## 2022-01-09 DIAGNOSIS — J9611 Chronic respiratory failure with hypoxia: Secondary | ICD-10-CM | POA: Diagnosis not present

## 2022-01-09 DIAGNOSIS — J449 Chronic obstructive pulmonary disease, unspecified: Secondary | ICD-10-CM | POA: Diagnosis not present

## 2022-01-09 NOTE — Progress Notes (Addendum)
Subjective:    Patient ID: [name]     DOB: 1932/03/21     MRN: [id number]  Brief patient profile:  62 yowf quit smoking 02/2013  referred 02/08/2012 by [name] for intermittent sob was on 02 but stopped in early 2013 and with GOLD III COPD documented 03/2012     History of Present Illness  11/09/2016  f/u ov/[name] re:   GOLD III / maint perforomist/ bud/Incruse/ prn saba neb  Chief Complaint  Patient presents with   Follow-up    Pt called on 11/05/16 with c/o cough with yellow sputum- zpack and pred taper given. She is much improved and barely coughing at all at this point. Breathing is at her normal baseline.    baseline doe = MMRC3 = can't walk 100 yards even at a slow pace at a flat grade s stopping due to sob  Even on 02  rec For flares of cough/ wheeze/ short of breath assoc with change in mucus as you have in the past  >>> zpak / Prednisone 10 mg take  4 each am x 2 days,   2 each am x 2 days,  1 each am x 2 days and stop    03/21/2021  f/u ov/[name] re: performist/ yupelri/pred 10 mg daily  Chief Complaint  Patient presents with   Follow-up    Breathing has improved since her last visit. She is still coughing-white sputum. She has not had to use her albuterol inhaler or neb.   Dyspnea:  Able to get around house easier  Cough: minimal white3  Sleeping: bed is flat/ 45 degree wedge   SABA use: minimal but neb  02: 2.5 lpm 24/7 does not monitor sats with activity  Covid status:   X 3  vax  Rec Prednisone 10 mg  2 until better then taper to 1/2 daily  Make sure you check your oxygen saturation  at your highest level of activity  to be sure it stays over 90% and adjust  02 flow upward to maintain this level if needed but remember to turn it back to previous settings when you stop (to conserve your supply).    06/27/2021  f/u ov/[name] re: endstage copd/ pred dep /02 dep and pred dep at 10 mg daily plus yupelri/performist Chief Complaint  Patient presents with   Follow-up     3 months, COPD Gold III,    Dyspnea:  variably sob better p saba / room to room at home, worse with hot weather  Cough: minimal rattling / not using neb  Sleeping: 45 degrees ok s am flare  SABA use: once in awhile  02: 2.5 lpm never titrates Covid status:   vax x 4  Rec Try taper prednisone  09-27-09 for a week and if no difference then just take 5 mg daily  -  if worse start over with 20 mg  Plan A = Automatic = Always=    performist and yupelri 1st thing in am then repeat the performinst 12 h later  Plan B = Backup (to supplement plan A, not to replace it) Only use your albuterol nebulizer as a rescue medication    01/09/2022  f/u ov/[name] virutal visit  Virtual Visit via  computer 12/20/2021   I connected with [name]  on 01/09/22  at 130 pm by video and verified that I am speaking with the correct person using two identifiers. Pt is at home and this call made from my office with  no other participants    I discussed the limitations, risks, security and privacy concerns of performing an evaluation and management service by video  and the availability of in person appointments. I also discussed with the patient that there may be a patient responsible charge related to this service. The patient expressed understanding and agreed to proceed.    I provided 24 minutes of non-face-to-face time during this encounter.  re: endstage copd    maint on prednisone plus performist/yupelri  Chief Complaint  Patient presents with   Follow-up    Breathing is unchanged since the last visit. She just finished zpack about a week ago.   Dyspnea:  room to room = baseline Cough:  just finished prn zpak which turned the mucus back to clear  Sleeping: 45 degrees wedge SABA use: not much  02: 2.5 lpm 24/7  Covid status:   vax x 5    No obvious day to day or daytime variability or assoc purulent sputum or mucus plugs or hemoptysis or cp or chest tightness, subjective wheeze or overt sinus or  hb symptoms.   Sleeping  without nocturnal  or early am exacerbation  of respiratory  c/o's or need for noct saba. Also denies any obvious fluctuation of symptoms with weather or environmental changes or other aggravating or alleviating factors except as outlined above   No unusual exposure hx or h/o childhood pna/ asthma or knowledge of premature birth.  Current Allergies, Complete Past Medical History, Past Surgical History, Family History, and Social History were reviewed in [employer] record.  ROS  The following are not active complaints unless bolded Hoarseness, sore throat, dysphagia, dental problems, itching, sneezing,  nasal congestion or discharge of excess mucus or purulent secretions, ear ache,   fever, chills, sweats, unintended wt loss or wt gain, classically pleuritic or exertional cp,  orthopnea pnd or arm/hand swelling  or leg swelling, presyncope, palpitations, abdominal pain, anorexia, nausea, vomiting, diarrhea  or change in bowel habits or change in bladder habits, change in stools or change in urine, dysuria, hematuria,  rash, arthralgias, visual complaints, headache, numbness, weakness or ataxia or problems with walking or coordination,  change in mood or  memory.        Current Meds  Medication Sig   acetaminophen (TYLENOL) 650 MG CR tablet Take 650 mg by mouth every 8 (eight) hours as needed for pain.   albuterol (PROVENTIL) (2.5 MG/3ML) 0.083% nebulizer solution USE 1 VIAL VIA NEBULIZER EVERY 6 HOURS AS NEEDED FOR WHEEZING OR SHORTNESS OF BREATH DX: J44.9   albuterol (VENTOLIN HFA) 108 (90 Base) MCG/ACT inhaler INHALE 2 PUFFS BY MOUTH EVERY 4 HOURS AS NEEDED FOR WHEEZING OR SHORTNESS OF BREATH   ALPRAZolam (XANAX) 0.25 MG tablet Take 1 tablet (0.25 mg total) by mouth 2 (two) times daily as needed for anxiety.   brimonidine (ALPHAGAN) 0.2 % ophthalmic solution Place 1 drop into the right eye 3 (three) times daily.    cetirizine (ZYRTEC) 10 MG tablet  Take 10 mg by mouth daily.   Cholecalciferol (VITAMIN D) 50 MCG (2000 UT) CAPS Take 1 capsule by mouth daily.   dextromethorphan-guaiFENesin (MUCINEX DM) 30-600 MG per 12 hr tablet Take 1 tablet by mouth at bedtime as needed (with flutter for cough, congestion, and thick mucus).    formoterol (PERFOROMIST) 20 MCG/2ML nebulizer solution Take 2 mLs (20 mcg total) by nebulization 2 (two) times daily.   omeprazole (PRILOSEC) 20 MG capsule Take 20 mg by mouth  daily.   OXYGEN Use 2L of O2 continuously   predniSONE (DELTASONE) 10 MG tablet TAKE 1/2 TABLET BY MOUTH DAILY, IF SYMPTOMS WORSEN TAKE 1 TABLET DAILY.   revefenacin (YUPELRI) 175 MCG/3ML nebulizer solution Take 3 mLs (175 mcg total) by nebulization daily.                Objective:   Physical Exam   Appears comfortable at rest on 02 on virtual visit, speaking in full phrases  Min rattling cough to request.         Assessment & Plan:

## 2022-01-09 NOTE — Assessment & Plan Note (Signed)
Onset 02 rx 2013 initially just at hs   - ono RA 05/14/12   4 h 33 min < 89%   So ordered 02 2lpm    - HCO3  32 05/10/13    - 01/05/2013   Walked RA x one lap @ 185 stopped due to  88%   - 01/05/2013  Walked 2lpm 2 laps @ 185 ft each stopped due to  90% sob but improved vs baseline   - RA 05/01/2013 sats = 87%  - 05/10/2017 > 02 sats 88% RA > referred for POC eval > preferred continuous flow - 05/14/2019   Walked RA  1 laps @  approx 154ft each @ slow pace  stopped due to sob and desats to 84% corrected on 2lpm and completed lap s desats  rec as of 01/09/2022  2.5 lpm but should  titrate with activity with target > 90%

## 2022-01-09 NOTE — Patient Instructions (Signed)
No change in medications but ok to double prednisone to 20 mg until better then back to 10 mg daily as your floor.   Please schedule a follow up visit in 6  months but call sooner if needed

## 2022-01-09 NOTE — Assessment & Plan Note (Signed)
Quit smoking 02/2013   - PFTs 03/24/2012  FEV1  0.91 (43%) ratio 48 and no better p B2,  DLCO 38% corrects to 53%  - changed to perforomist/bud bid 04/2013  -2015 pulmonary rehab  - med calendar 07/17/13 > did not bring 06/28/2014 , .redone 07/26/2014  - 06/28/2014 p extensive coaching HFA effectiveness =    75% (short Ti) - 08/24/14 started IMPACT trial >finished 08/2015  - PFTs 12/13/14  FEV1 0.85 (38%) ratio 45  p 5 % improvement from saba  09/20/2015 Med calendar  - 05/08/2016 changed lama to incruse  - 11/12/2017  After extensive coaching HFA effectiveness =   75% (short Ti) - 11/12/17 > changed to trelegy   - 02/07/2021  Flare of cough on anoro >  Try gerd rx  - Prednisone ceiling of 20 and floor of 10 mg daily 02/16/21  - 02/21/2021 changed to  performist bid and yupelri q am > improved 03/21/2021     Group D in terms of symptom/risk and laba/lama/ICS  therefore appropriate rx at this point >>>  Yup, performist, prednisone floor 10 and prn zpak seem to have stabilized what amts to endstage copd for now so no change in rx needed          Each maintenance medication was reviewed in detail including emphasizing most importantly the difference between maintenance and prns and under what circumstances the prns are to be triggered using an action plan format where appropriate.  Total time for H and P, chart review, counseling, reviewing neb/02 device(s) and generating customized AVS unique to this office visit / same day charting = 20 min

## 2022-01-15 ENCOUNTER — Ambulatory Visit: Payer: Medicare Other | Admitting: Internal Medicine

## 2022-01-15 DIAGNOSIS — B351 Tinea unguium: Secondary | ICD-10-CM | POA: Diagnosis not present

## 2022-01-15 DIAGNOSIS — M2041 Other hammer toe(s) (acquired), right foot: Secondary | ICD-10-CM | POA: Diagnosis not present

## 2022-02-01 ENCOUNTER — Other Ambulatory Visit: Payer: Self-pay

## 2022-02-01 ENCOUNTER — Other Ambulatory Visit: Payer: Medicare Other

## 2022-02-01 ENCOUNTER — Other Ambulatory Visit: Payer: Medicare Other | Admitting: Hospice

## 2022-02-01 ENCOUNTER — Other Ambulatory Visit: Payer: Self-pay | Admitting: Internal Medicine

## 2022-02-01 VITALS — BP 122/80 | HR 88 | Temp 97.3°F | Resp 24

## 2022-02-01 DIAGNOSIS — J449 Chronic obstructive pulmonary disease, unspecified: Secondary | ICD-10-CM

## 2022-02-01 DIAGNOSIS — Z515 Encounter for palliative care: Secondary | ICD-10-CM

## 2022-02-01 DIAGNOSIS — F419 Anxiety disorder, unspecified: Secondary | ICD-10-CM

## 2022-02-01 NOTE — Progress Notes (Deleted)
[facility] Consult Note Telephone: [phone]  Fax: [phone]    Date of encounter: 02/01/22 12:03 PM PATIENT NAME: [name] [address] [place] [address]   [id number] (home)  DOB: [birth year] MRN: [id number] PRIMARY CARE PROVIDER:    Denita Lung, MD,  [place] [address] [phone]  REFERRING PROVIDER:   Denita Lung, [place],  [place] [address] [phone]  RESPONSIBLE PARTY:    Contact Information     Name Relation Home Work Leroy Daughter   [phone]   Micheal Likens   [phone]      Due to the COVID-19 crisis, this visit was done via telemedicine from my office and it was initiated and consent by this patient and or family.  I connected with  [name] OR PROXY on 02/01/22 by a video enabled telemedicine application and verified that I am speaking with the correct person using two identifiers.   I discussed the limitations of evaluation and management by telemedicine. The patient expressed understanding and agreed to proceed.    I met face to face with patient and family in the home connecting virtually with [name], RN.   Palliative Care was asked to follow this patient by consultation request of  Denita Lung, MD to address advance care planning and complex medical decision making. This is a follow up visit.                                   ASSESSMENT AND PLAN / RECOMMENDATIONS:   Advance Care Planning/Goals of Care: Goals include to maximize quality of life and symptom management. Patient/health care surrogate gave his/her permission to discuss. Our advance care planning conversation included a discussion about:    The value and importance of advance care planning Identification of a healthcare agent-[name]-daughter Review advance directive Decision not to resuscitate  confirmed. CODE STATUS: DNR  Symptom Management/Plan:    Follow up Palliative Care Visit: Palliative care will continue to follow for complex medical decision making, advance care planning, and clarification of goals. Return *** weeks or prn.  I spent *** minutes providing this consultation. More than 50% of the time in this consultation was spent in counseling and care coordination.  This visit was coded based on medical decision making (MDM).***  PPS: 50%  HOSPICE ELIGIBILITY/DIAGNOSIS: TBD  Chief Complaint: ***  HISTORY OF PRESENT ILLNESS:  [name] is a 88 y.o. year old female  with *** .   History obtained from review of EMR, discussion with primary team, and interview with family, facility staff/caregiver and/or [name].  I reviewed available labs, medications, imaging, studies and related documents from the EMR.  Records reviewed and summarized above.   ROS  General: NAD EYES: denies vision changes ENMT: denies dysphagia Cardiovascular: denies chest pain, + DOE Pulmonary: + cough, denies increased SOB Abdomen: endorses good appetite, denies constipation, endorses continence of bowel GU: denies dysuria, endorses continence of urine MSK:  denies increased weakness,  no falls reported Skin: denies rashes or wounds Neurological: denies pain, denies insomnia Psych: Endorses positive mood Heme/lymph/immuno: denies bruises, abnormal bleeding  Physical Exam: Current and past weights: No recent weights. Constitutional: NAD General: frail appearing EYES: anicteric sclera, lids intact, no discharge  ENMT: intact hearing, oral mucous membranes moist, dentition intact CV: S1S2, RRR, 1+ bilateral  ankle edema Pulmonary: Diminished breath sounds bilaterally,  pursed lip breathing,  + cough with clear sputum production, O2 @ 2.5 L via [place]. Abdomen: intake 50-75%, normo-active BS + 4 quadrants, soft and non tender, no ascites GU: deferred MSK: + sarcopenia, moves  all extremities, ambulatory Skin: warm and dry, no rashes or wounds on visible skin Neuro:  no generalized weakness,  no cognitive impairment Psych: non-anxious affect, A and O x 3 Hem/lymph/immuno: no widespread bruising   Thank you for the opportunity to participate in the care of [name].  The palliative care team will continue to follow. Please call our office at [phone] if we can be of additional assistance.   Lorenza Burton, RN   COVID-19 PATIENT SCREENING TOOL Asked and negative response unless otherwise noted:   Have you had symptoms of covid, tested positive or been in contact with someone with symptoms/positive test in the past 5-10 days?

## 2022-02-01 NOTE — Progress Notes (Signed)
[facility] SW NOTE  PATIENT NAME: [name] DOB: 10/17/1932 MRN: [id number]  PRIMARY CARE PROVIDER: Denita Lung, MD  RESPONSIBLE PARTY:  Acct ID - Guarantor Home Phone Work Phone Relationship Acct Type  [id number] [name], [name]* [phone]  Self P/F     [place], [place], [place] [address]     PLAN OF CARE and INTERVENTIONS:              GOALS OF CARE/ ADVANCE CARE PLANNING:  Goals include to maximize quality of life and symptom management. Our advance care planning conversation included a discussion about:    The value and importance of advance care planning  Review and updating or creation of an advance directive document.  Patient is a DNR. MOST form completed. HCPOA is daughter, Kayleen Memos.    Palliative care encounter: Palliative medicine team will continue to support patient, patient's family, and medical team. Visit consisted of counseling and education dealing with the complex and emotionally intense issues of symptom management and palliative care in the setting of serious and potentially life-threatening illness   SW and RN met with patient Patient resides in a one story home.    Functional changes/updates: Patient has not had a functional decline since previous visit. No falls reported. Patient has RW but does not use.   Home & Environment assessment: No concerns.   Protein calorie malnutrition/weight loss/Appetite: Patient continues  with fair-poor appetite.   Family share patient is sleeping more. Up to 6 additional hours during the day. Family share that lately patient stays awake normally for meals and naps the rest of the day. Patient napping/sleeping during most of todays visit.   Psychosocial assessment: completed. No additional physical needs identified at this time. SW discussed additional caregiver needs with patient and daughter. Patient expressed disintrest in obtaining additional caregivers but is agreeable to the service due to  her recent decline. SW explained caregiver agencies vs private caregivers. Patient's daughter has contacts for personal caregivers and will outreach them to discuss patient needs. Patients daughter is in town with no return date as yet and assisting patient with household task and ADL's until additional caregiver plan is in place. Ongoing support/resources will continued to be offered if needed.  Military hx: N/A    Medical assessment: RN reviewed medications and took vitals. RN and SW provided education around potential life threatening illness and symptom management. No medication needs at this time.   Hospice discussion: Hospice services discussed in detail with patient and daughter, as patient has exhibited an overall decline since last PC visit. Patient is not receptive to hospice services at this time.    SW discussed goals, reviewed care plan, provided emotional support, used active and reflective listening in the form of reciprocity emotional response. Questions and concerns were addressed. The patient/family was encouraged to call with any additional questions and/or concerns. PC Provided general support and encouragement, no other unmet needs identified. Will continue to follow.   3.         PATIENT/CAREGIVER EDUCATION/ COPING:   Appearance: well groomed, appropriate given situation  Mental Status: Alert/oriented x3.  Eye Contact: fair. Patients eyes closed majority of visit, unless addressed directly.  Thought Process: rational  Thought Content: not assessed  Speech: Normal rate, volume, tone  Mood: Normal and calm Affect: Congruent to endorsed mood, full ranging Insight: normal Judgement: normal  Interaction Style: Cooperative   Patient A&O x3, able to simple conversation but is less engaging during this visit. At  baseline patient is HOH. Patient denies depression and anxiety, patient has new refill of Xanax that she has not utilized. PHQ 9 not completed. Patients family is  supportive.    4.         PERSONAL EMERGENCY PLAN:  Patient/family will call 9-1-1 for emergencies. Patient has life alert system.   5.         COMMUNITY RESOURCES COORDINATION/ HEALTH CARE NAVIGATION:  daughter manages his care.   6.         FINANCIAL CONCERNS/NEEDS: None.                         Primary Health Insurance: Medicare Secondary Health Insurance:  Prescription Coverage: Yes, no history of difficulty obtaining or affording prescriptions reported     SOCIAL HX:  Social History   Tobacco Use   Smoking status: Former    Packs/day: 0.30    Years: 50.00    Pack years: 15.00    Types: Cigarettes    Quit date: 03/05/2013    Years since quitting: 8.9   Smokeless tobacco: Never  Substance Use Topics   Alcohol use: No    CODE STATUS: DNR ADVANCED DIRECTIVES: Y MOST FORM COMPLETE: Y HOSPICE EDUCATION PROVIDED: N  PPS: 70%   Time spent: 45 min       [place], [place]

## 2022-02-01 NOTE — Progress Notes (Addendum)
[facility] Consult Note Telephone: [phone]  Fax: [phone]  PATIENT NAME: [name] DOB: [birth year] MRN: [id number]  PRIMARY CARE PROVIDER:   Denita Lung, MD [name],  [place] [address]  REFERRING PROVIDER: Denita Lung, MD [name],  [place] [address]  RESPONSIBLE PARTY:    [name] is [facility] [place]     Name Relation Home Work Mobile   Booker,Debra Daughter   [phone]   [name] Sister   [phone]      TELEHEALTH VISIT STATEMENT Due to the COVID-19 crisis, this visit was done via telemedicine from my office and it was initiated and consent by this patient and or family.  I connected with patient OR PROXY by a telephone  and verified that I am speaking with the correct person. I discussed the limitations of evaluation and management by telemedicine. The patient expressed understanding and agreed to proceed. Palliative Care was asked to follow this patient to address advance care planning, complex medical decision making and goals of care clarification.  [name] is with patient during visit. Visit is to build trust and highlight Palliative Medicine as specialized medical care for people living with serious illness, aimed at facilitating better quality of life through symptoms relief, assisting with advance care planning and complex medical decision making. This is a follow up visit.  RECOMMENDATIONS/PLAN:   Advance Care Planning/Code Status:Patient is a Do Not Resuscitate.   Goals of Care: Goals of care include to maximize quality of life and symptom management.  Reviewed MOST selections which include DO NOT RESUSCITATE, comfort measures, antibiotics if indicated, IV fluids for defined trial.,  No feeding tube. Palliative care team will continue to support patient, patient's family, and medical  team.  Symptom management/Plan:   COPD: with associated cough and shortness of breath on exertion. Continue oxygen supplementation as ordered. OTC Mucinex. Continue Prednisone 10mg  daily and breathing treatments as ordered. Education on slow deep breathing and appropriate use of Albuterol.  Anxiety:Managed with Xanax.  Use approach, redirection and de-escalation. Follow up: Palliative care will continue to follow for complex medical decision making, advance care planning, and clarification of goals. Return 6 weeks or prn. Encouraged to call provider sooner with any concerns. CHIEF COMPLAINT: Palliative follow up  HISTORY OF PRESENT ILLNESS:  [name] a 88 y.o. female with multiple medical problems including COPD, anxiety, chronic respiratory failure with hypoxia.  Patient denies pain/discomfort, endorses shortness of breath on exertion, requiring assistance in activities of daily living.  History obtained from review of EMR, discussion with primary team, family and/or patient. Records reviewed and summarized above. All 10 point systems reviewed and are negative except as documented in history of present illness above  Review and summarization of Epic records shows history from other than patient.   Palliative Care was asked to follow this patient o help address complex decision making in the context of advance care planning and goals of care clarification. I reviewed, as needed, available labs, patient records, imaging, studies and related documents from the EMR.     PERTINENT MEDICATIONS:  Outpatient Encounter Medications as of 02/01/2022  Medication Sig   acetaminophen (TYLENOL) 650 MG CR tablet Take 650 mg by mouth every 8 (eight) hours as needed for pain.   albuterol (PROVENTIL) (2.5 MG/3ML) 0.083% nebulizer solution USE 1 VIAL VIA NEBULIZER EVERY 6 HOURS AS NEEDED FOR  WHEEZING OR SHORTNESS OF BREATH DX: J44.9   albuterol (VENTOLIN HFA) 108 (90 Base) MCG/ACT inhaler INHALE 2 PUFFS BY  MOUTH EVERY 4 HOURS AS NEEDED FOR WHEEZING OR SHORTNESS OF BREATH   ALPRAZolam (XANAX) 0.25 MG tablet Take 1 tablet (0.25 mg total) by mouth 2 (two) times daily as needed for anxiety.   azithromycin (ZITHROMAX) 250 MG tablet Take by mouth. (Patient not taking: Reported on 01/09/2022)   brimonidine (ALPHAGAN) 0.2 % ophthalmic solution Place 1 drop into the right eye 3 (three) times daily.    cetirizine (ZYRTEC) 10 MG tablet Take 10 mg by mouth daily.   Cholecalciferol (VITAMIN D) 50 MCG (2000 UT) CAPS Take 1 capsule by mouth daily.   dextromethorphan-guaiFENesin (MUCINEX DM) 30-600 MG per 12 hr tablet Take 1 tablet by mouth at bedtime as needed (with flutter for cough, congestion, and thick mucus).    formoterol (PERFOROMIST) 20 MCG/2ML nebulizer solution Take 2 mLs (20 mcg total) by nebulization 2 (two) times daily.   omeprazole (PRILOSEC) 20 MG capsule Take 20 mg by mouth daily.   OXYGEN Use 2L of O2 continuously   predniSONE (DELTASONE) 10 MG tablet TAKE 1/2 TABLET BY MOUTH DAILY, IF SYMPTOMS WORSEN TAKE 1 TABLET DAILY.   revefenacin (YUPELRI) 175 MCG/3ML nebulizer solution Take 3 mLs (175 mcg total) by nebulization daily.   Facility-Administered Encounter Medications as of 02/01/2022  Medication   influenza  inactive virus vaccine (FLUZONE/FLUARIX) injection 0.5 mL    HOSPICE ELIGIBILITY/DIAGNOSIS: TBD  PAST MEDICAL HISTORY:  Past Medical History:  Diagnosis Date   Asthma    COPD (chronic obstructive pulmonary disease) ([place]) 01/2010   Dr. Melvyn Novas;   [facility] pulmon[facility])    Former smoker    quit 02/2013   H/O bone density study 2008   osteoporosis, improved density on Fosamax at that time   Hypoxia 01/2013   hospitalization, Bipap therapy, COPD exacerbation   Osteoporosis       ALLERGIES: No Known Allergies    I spent 60 minutes providing this consultation; this includes time spent with patient/family, chart review and documentation. More than 50% of the time in this consultation was  spent on counseling and coordinating communication   Thank you for the opportunity to participate in the care of [name] Please call our office at [phone] if we can be of additional assistance.  Note: Portions of this note were generated with Lobbyist. Dictation errors may occur despite best attempts at proofreading.  Teodoro Spray, NP

## 2022-02-05 ENCOUNTER — Telehealth: Payer: Self-pay | Admitting: Internal Medicine

## 2022-02-05 MED ORDER — YUPELRI 175 MCG/3ML IN SOLN: RESPIRATORY_TRACT | 11 refills | Status: DC

## 2022-02-05 NOTE — Telephone Encounter (Signed)
I have resent the Rx for pt's Yupelri to [facility] for pt and added a year's supply of refills on Rx for pt. Called and spoke with pt letting her know this was done and she verbalized understanding. Nothing further needed.

## 2022-02-06 ENCOUNTER — Telehealth: Payer: Self-pay | Admitting: Internal Medicine

## 2022-02-07 ENCOUNTER — Ambulatory Visit: Payer: Medicare Other | Admitting: Podiatry

## 2022-02-07 NOTE — Telephone Encounter (Signed)
I called and left a message with the daughter that we had sent in a refill of the nebulizer solution to the pharmacy. She was asked to call back with any questions.

## 2022-03-01 ENCOUNTER — Other Ambulatory Visit: Payer: Medicare Other | Admitting: *Deleted

## 2022-03-01 ENCOUNTER — Telehealth: Payer: Self-pay | Admitting: Internal Medicine

## 2022-03-01 ENCOUNTER — Other Ambulatory Visit: Payer: Self-pay

## 2022-03-01 ENCOUNTER — Other Ambulatory Visit: Payer: Medicare Other

## 2022-03-01 VITALS — BP 146/90 | HR 94 | Temp 97.8°F | Resp 18

## 2022-03-01 DIAGNOSIS — Z515 Encounter for palliative care: Secondary | ICD-10-CM

## 2022-03-01 MED ORDER — FORMOTEROL FUMARATE 20 MCG/2ML IN NEBU: INHALATION_SOLUTION | RESPIRATORY_TRACT | 11 refills | Status: DC

## 2022-03-01 MED ORDER — YUPELRI 175 MCG/3ML IN SOLN: RESPIRATORY_TRACT | 11 refills | Status: DC

## 2022-03-01 MED ORDER — PREDNISONE 10 MG PO TABS: 10.0000 mg | ORAL_TABLET | Freq: Every day | ORAL | 5 refills | Status: DC

## 2022-03-01 NOTE — Telephone Encounter (Signed)
ATC daughter x1.  LVM that refill sent to pharmacy as requested and to call the office with any questions. ?

## 2022-03-01 NOTE — Progress Notes (Signed)
[facility] PALLIATIVE CARE RN NOTE ? ?PATIENT NAME: [name] ?DOB: Nov 10, 1932 ?MRN: [id number] ? ?PRIMARY CARE PROVIDER: Denita Lung, MD ? ?RESPONSIBLE PARTY: [name] (daughter) ?Acct ID - Guarantor Home Phone Work Phone Relationship Acct Type  ?[id number] EDNA, REDE* [phone]  Self P/F  ?   [address] [place], [place] [address]  ? ?Covid-19 Pre-screening Negative ? ?PLAN OF CARE and INTERVENTION:  ?ADVANCE CARE PLANNING/GOALS OF CARE: Patient wants to remain in her home and as independent as possible. She has a DNR and MOST form. ?PATIENT/CAREGIVER EDUCATION: Symptom management and safe mobility ?DISEASE STATUS: ? ?Joint visit completed with Olene Craven MSW. Met with patient and both daughter [name] in the home. Daughter lives out of town but visits patient regularly. ? ?Respiratory: She is oxygen dependent at 2.5 L continuously. Last night woke up at 2a short of breath. Took  prn Albuterol nebulizer which was effective and she was able to go back to sleep. Takes Albuterol prior to going out to appointments and only requires about 1-2x/month on average. She continues on Perforomist twice daily and Yupelri daily and feels medications are controlling symptoms well. Productive cough. Phlegm is clear. Lungs are clear but diminished. No wheezing or crackles noted. ? ?Cardiovascular: Daughter reports significant lower leg edema a few days ago. This has improved with elevation and removing socks that were too tight and daughter felt they were cutting off her circulation. Doesn't want compression hose as it would be too difficult for her to remove herself.  ? ?Mobility: She is ambulatory. She has a walker, cane and wheelchair but only uses when travelling outside of the home. No recent falls. Takes sponge baths and able to dress self independently. Has caregivers who come daily for a few hours to help with light housework and meal preparation. ? ?Appetite: Patient feels her appetite is  good. Eats 2 meals/day usually lunch and dinner. Denies dysphagia.  ? ?Palliative care will continue to follow patient. Next visit 04/12/22 @ 12p. ? ?CODE STATUS: DNR ?ADVANCED DIRECTIVES: Y ?MOST FORM: yes ?PPS: 50% ? ? ?PHYSICAL EXAM:  ? ?VITALS: ?Today's Vitals  ? 03/01/22 1235  ?BP: (!) 146/90  ?Pulse: 94  ?Resp: 18  ?Temp: 97.8 ?F (36.6 ?C)  ?TempSrc: Temporal  ?SpO2: 95%  ?PainSc: 0-No pain  ?  ?LUNGS: clear to auscultation  ?CARDIAC: Cor RRR ?EXTREMITIES: Trace edema to bilateral lower extremities, left>right ?SKIN:  Thin/frail skin; bruise noted to left hand   ?NEURO:  Alert and oriented x 3, pleasant mood, generalized weakness, ambulatory ? ? ?(Duration of visit and documentation 45 minutes) ? ? ? ?Daryl Eastern, RN BSN ? ?

## 2022-03-01 NOTE — Telephone Encounter (Signed)
Called and spoke with patient's daughter, [name] regarding the scripts sent to [facility].  Advised they have been corrected and resent.  Advised that one of the scripts is not on the formulary and will likely require a PA.  Advised that [facility] will do the PA and they will be advised if it is approved or not and they will advise our office as well if it is denied for a substitution.  She verbalized understanding.  Nothing further needed. ?

## 2022-03-27 ENCOUNTER — Other Ambulatory Visit: Payer: Self-pay | Admitting: Family Medicine

## 2022-03-27 MED ORDER — AZITHROMYCIN 250 MG PO TABS
250.0000 mg | ORAL_TABLET | Freq: Every day | ORAL | 0 refills | Status: DC
  Filled 2022-03-27: qty 6, 6d supply, fill #0

## 2022-03-27 NOTE — Telephone Encounter (Signed)
Pt is advising that she was advised to keep this script on hand. Please advise if you will fill Thanks Idamay ?

## 2022-03-28 ENCOUNTER — Other Ambulatory Visit: Payer: Self-pay

## 2022-03-28 ENCOUNTER — Encounter: Payer: Self-pay | Admitting: Internal Medicine

## 2022-03-28 MED ORDER — AZITHROMYCIN 250 MG PO TABS: ORAL_TABLET | ORAL | 3 refills | Status: DC

## 2022-04-04 ENCOUNTER — Ambulatory Visit (INDEPENDENT_AMBULATORY_CARE_PROVIDER_SITE_OTHER): Payer: Medicare Other | Admitting: Nurse Practitioner

## 2022-04-04 ENCOUNTER — Ambulatory Visit (INDEPENDENT_AMBULATORY_CARE_PROVIDER_SITE_OTHER): Payer: Medicare Other

## 2022-04-04 ENCOUNTER — Encounter: Payer: Self-pay | Admitting: Nurse Practitioner

## 2022-04-04 VITALS — BP 124/64 | HR 87 | Ht 67.0 in | Wt 137.4 lb

## 2022-04-04 DIAGNOSIS — J9612 Chronic respiratory failure with hypercapnia: Secondary | ICD-10-CM

## 2022-04-04 DIAGNOSIS — R600 Localized edema: Secondary | ICD-10-CM | POA: Diagnosis not present

## 2022-04-04 DIAGNOSIS — J9611 Chronic respiratory failure with hypoxia: Secondary | ICD-10-CM | POA: Diagnosis not present

## 2022-04-04 DIAGNOSIS — J449 Chronic obstructive pulmonary disease, unspecified: Secondary | ICD-10-CM

## 2022-04-04 DIAGNOSIS — R0600 Dyspnea, unspecified: Secondary | ICD-10-CM | POA: Diagnosis not present

## 2022-04-04 NOTE — Assessment & Plan Note (Signed)
End-stage COPD on supplemental oxygen followed by palliative care.  Breathing overall stable and at baseline.  Has not required Z-Pak or increase in daily prednisone since January.  Maintained on dual therapy nebulizers. ? ?Patient Instructions  ?-Continue Perforomist 2 mL neb Twice daily ?-Continue Yupelri one vial neb daily ?-Continue Albuterol inhaler 2 puffs or 3 mL neb every 6 hours as needed for shortness of breath or wheezing. Notify if symptoms persist despite rescue inhaler/neb use. ?-Continue Zytrec 10 mg daily for allergies ?-Continue Prilosec 20 mg daily ?-Continue daily prednisone 10 mg, increase to 20 mg daily for worsening symptoms then return back to 10 mg as previously directed ?-Continue Z pack as needed for increased productive cough  ?-Continue supplemental O2 3 lpm continuous and 4 lpm POC. Goal oxygen saturation >88-90%. Order sent to medical equipment company to get you a new POC machine that will go up to 4 lpm.  ? ?Chest x ray today. ? ?Follow up in 3 months with Dr. Melvyn Novas or Alanson Aly. If symptoms do not improve or worsen, please contact office for sooner follow up or seek emergency care. ? ? ?

## 2022-04-04 NOTE — Patient Instructions (Addendum)
-  Continue Perforomist 2 mL neb Twice daily ?-Continue Yupelri one vial neb daily ?-Continue Albuterol inhaler 2 puffs or 3 mL neb every 6 hours as needed for shortness of breath or wheezing. Notify if symptoms persist despite rescue inhaler/neb use. ?-Continue Zytrec 10 mg daily for allergies ?-Continue Prilosec 20 mg daily ?-Continue daily prednisone 10 mg, increase to 20 mg daily for worsening symptoms then return back to 10 mg as previously directed ?-Continue Z pack as needed for increased productive cough  ?-Continue supplemental O2 3 lpm continuous and 4 lpm POC. Goal oxygen saturation >88-90%. Order sent to medical equipment company to get you a new POC machine that will go up to 4 lpm.  ? ?Chest x ray today. ? ?Follow up in 3 months with Dr. Melvyn Novas or Alanson Aly. If symptoms do not improve or worsen, please contact office for sooner follow up or seek emergency care. ?

## 2022-04-04 NOTE — Assessment & Plan Note (Signed)
No evidence of pulm edema on CXR or worsening SOB. Advised she follow up with PCP who has prescribed lasix in the past for this per the pt and her friend.  ?

## 2022-04-04 NOTE — Progress Notes (Signed)
Please notify pt no evidence of pulmonary edema or acute infectious process. Findings are relatively unchanged, consistent with possible scarring. She also has some possible atelectasis vs scarring in the bases. Given these findings, I would have her follow up with her PCP regarding her leg swelling, as this is a known problem for her, and determine next steps as far as lasix treatment goes. Thanks

## 2022-04-04 NOTE — Assessment & Plan Note (Addendum)
Noted to be 87% on 3 L POC.  Stabilized to 93% with change to 3 L continuous. Current POC unable to be increased past 3 lpm. Did trial on our office POC at 4 L/min which she was able to maintain saturations 93 to 96%.  Order sent for new POC machine 4 lpm. Advised her to use her continuous home concentrator 3 lpm when at home. Goal SpO2 >88-90%. Given slight increase in O2 demand and BLE edema, CXR obtained - no evidence of superimposed infection or overt pulm edema present.  ?

## 2022-04-04 NOTE — Progress Notes (Signed)
? ?'@Patient'$  ID: [name], female    DOB: [birth year], 88 y.o.   MRN: [id number] ? ?Chief Complaint  ?Patient presents with  ? Follow-up  ?  Needs letter signed that she needs o2  ? ? ?Referring provider: ?Denita Lung, MD ? ?HPI: ?88 year old female, former smoker (15 pack years) followed for end stage COPD and chronic respiratory failure on supplemental O2. She is a patient of Dr. Gustavus Bryant and last seen via virtual visit on 01/09/2022. Past medical history significant for sciatica, anxiety, renal insufficiency, hx of pseudomonal infection, GERD, and allergic rhinitis. She is followed by palliative care.  ? ?TEST/EVENTS:  ?08/31/2015 PFTs: FVC 63, FEV1 35, ratio 42. Very severe obstructive airway disease ? ?01/09/2022: Virtual visit with Dr. Melvyn Novas. Finished z pack about a week ago for purulent cough; cough at baseline with clear sputum. Breathing at baseline on dual therapy nebs and daily 10 mg prednisone. Oxygen stable on 3 lpm continuous at home.  ? ?04/04/2022: Today-follow-up ?Patient presents today with a close friend for oxygen renewal.  She reports that her breathing has been relatively stable.  She has not required an increase in her daily prednisone recently.  She did complete a Z-Pak in the beginning of January.  Her cough has been stable since then with occasional clear sputum.  She was noted to be 87% on 3 L POC upon arrival to exam room.  She was placed on 3 L continuous and recovered to 93%.  She normally uses continuous oxygen at home and only uses POC when going to appointments.  She has had a slight increase in swelling in both her lower legs.  This is a chronic problem for her and she has been on Lasix in the past prescribed at her primary care doctor for this.  She denies any calf pain or redness.  Denies any orthopnea, PND or wheezing.  She continues on dual therapy nebs with Perforomist and Yupelri.  Rarely uses her albuterol. ? ?No Known Allergies ? ?Immunization History  ?Administered  Date(s) Administered  ? DTaP 12/06/1992, 04/27/2004  ? Fluad Quad(high Dose 65+) 09/01/2020, 09/18/2021  ? Influenza Split 10/07/2012, 09/11/2013  ? Influenza Whole 10/08/2001, 10/02/2002, 09/19/2011, 09/23/2016  ? Influenza, High Dose Seasonal PF 09/22/2014, 09/30/2017, 09/17/2018, 09/17/2019  ? Influenza,inj,Quad PF,6+ Mos 09/20/2015  ? PFIZER(Purple Top)SARS-COV-2 Vaccination 01/02/2020, 02/02/2020, 09/01/2020, 06/20/2021  ? Pension scheme manager 26yr & up 09/18/2021  ? Pneumococcal Conjugate-13 05/27/2014  ? Pneumococcal Polysaccharide-23 04/07/2013  ? Tdap 06/05/2019  ? Zoster, Live 12/03/2008  ? ? ?Past Medical History:  ?Diagnosis Date  ? Asthma   ? [place]  ? Dr. WMelvyn Novas  ? [facility] pulmonale ([facility]   ? Former smoker   ? quit 02/2013  ? H/O bone density study 2008  ? osteoporosis, improved density on Fosamax at that time  ? Hypoxia 01/2013  ? hospitalization, Bipap therapy, COPD exacerbation  ? Osteoporosis   ? ? ?Tobacco History: ?Social History  ? ?Tobacco Use  ?Smoking Status Former  ? Packs/day: 0.30  ? Years: 50.00  ? Pack years: 15.00  ? Types: Cigarettes  ? Quit date: 03/05/2013  ? Years since quitting: 9.0  ?Smokeless Tobacco Never  ? ?Counseling given: Not Answered ? ? ?Outpatient Medications Prior to Visit  ?Medication Sig Dispense Refill  ? acetaminophen (TYLENOL) 650 MG CR tablet Take 650 mg by mouth every 8 (eight) hours as needed for pain.    ? albuterol (  PROVENTIL) (2.5 MG/3ML) 0.083% nebulizer solution USE 1 VIAL VIA NEBULIZER EVERY 6 HOURS AS NEEDED FOR WHEEZING OR SHORTNESS OF BREATH DX: J44.9 150 mL 3  ? albuterol (VENTOLIN HFA) 108 (90 Base) MCG/ACT inhaler INHALE 2 PUFFS BY MOUTH EVERY 4 HOURS AS NEEDED FOR WHEEZING OR SHORTNESS OF BREATH 8.5 g 1  ? ALPRAZolam (XANAX) 0.25 MG tablet Take 1 tablet (0.25 mg total) by mouth 2 (two) times daily as needed for anxiety. 12 tablet 0  ? brimonidine (ALPHAGAN) 0.2 % ophthalmic solution  Place 1 drop into the right eye 3 (three) times daily.     ? cetirizine (ZYRTEC) 10 MG tablet Take 10 mg by mouth daily.    ? Cholecalciferol (VITAMIN D) 50 MCG (2000 UT) CAPS Take 1 capsule by mouth daily.    ? dextromethorphan-guaiFENesin (MUCINEX DM) 30-600 MG per 12 hr tablet Take 1 tablet by mouth at bedtime as needed (with flutter for cough, congestion, and thick mucus).     ? formoterol (PERFOROMIST) 20 MCG/2ML nebulizer solution Inhale one vial in nebulizer twice a day. 120 mL 11  ? omeprazole (PRILOSEC) 20 MG capsule Take 20 mg by mouth daily.    ? OXYGEN Use 2L of O2 continuously    ? predniSONE (DELTASONE) 10 MG tablet Take 1 tablet (10 mg total) by mouth daily with breakfast. 30 tablet 5  ? revefenacin (YUPELRI) 175 MCG/3ML nebulizer solution Inhale one vial in nebulizer once daily. Do not mix with other nebulized medications. 30 mL 11  ? azithromycin (ZITHROMAX Z-PAK) 250 MG tablet Take as directed 6 each 3  ? azithromycin (ZITHROMAX) 250 MG tablet Take 1 tablet (250 mg total) by mouth daily. 6 each 0  ? predniSONE (DELTASONE) 10 MG tablet TAKE 1/2 TABLET BY MOUTH DAILY, IF SYMPTOMS WORSEN TAKE 1 TABLET DAILY. 100 tablet 0  ? ?Facility-Administered Medications Prior to Visit  ?Medication Dose Route Frequency Provider Last Rate Last Admin  ? influenza  inactive virus vaccine (FLUZONE/FLUARIX) injection 0.5 mL  0.5 mL Intramuscular Once Denita Lung, MD      ? ? ? ?Review of Systems:  ? ?Constitutional: No weight loss or gain, night sweats, fevers, chills, fatigue, or lassitude. ?HEENT: No headaches, difficulty swallowing, tooth/dental problems, or sore throat. No sneezing, itching, ear ache, nasal congestion, or post nasal drip ?CV:  +swelling in lower extremities. No chest pain, orthopnea, PND, anasarca, dizziness, palpitations, syncope ?Resp: +shortness of breath with exertion (baseline); occasional productive cough (clear; baseline) No excess mucus or change in color of mucus. No hemoptysis. No  wheezing.  No chest wall deformity ?GI:  No heartburn, indigestion, abdominal pain, nausea, vomiting, diarrhea, change in bowel habits, loss of appetite, bloody stools.  ?Skin: No rash, lesions, ulcerations ?MSK:  No joint pain or swelling.  No decreased range of motion.  No back pain. ?Neuro: No dizziness or lightheadedness.  ?Psych: No depression or anxiety. Mood stable.  ? ? ? ?Physical Exam: ? ?BP 124/64 (BP Location: Left Arm, Cuff Size: Normal)   Pulse 87   Ht '5\' 7"'$  (1.702 m)   Wt 137 lb 6.4 oz (62.3 kg)   SpO2 93%   BMI 21.52 kg/m?  ? ?GEN: Pleasant, interactive, chronically-ill appearing; elderly, frail; in no acute distress. ?HEENT:  Normocephalic and atraumatic. PERRLA. Sclera white. Nasal turbinates pink, moist and patent bilaterally. No rhinorrhea present. Oropharynx pink and moist, without exudate or edema. No lesions, ulcerations, or postnasal drip.  ?NECK:  Supple w/ fair ROM. No JVD present.  Normal carotid impulses w/o bruits. Thyroid symmetrical with no goiter or nodules palpated. No lymphadenopathy.   ?CV: RRR, no m/r/g, +1 pitting BLE edema, L slightly more than R. Pulses intact, +2 bilaterally. No cyanosis, pallor or clubbing. ?PULMONARY:  Unlabored, regular breathing. Diminished bilaterally A&P w/o wheezes/rales/rhonchi. No accessory muscle use. No dullness to percussion. ?GI: BS present and normoactive. Soft, non-tender to palpation. No organomegaly or masses detected. No CVA tenderness. ?MSK: No erythema, warmth or tenderness. Cap refil <2 sec all extrem. No deformities or joint swelling noted.  ?Neuro: A/Ox3. No focal deficits noted.   ?Skin: Warm, no lesions or rashe ?Psych: Normal affect and behavior. Judgement and thought content appropriate.  ? ? ? ?Lab Results: ? ?CBC ?   ?Component Value Date/Time  ? WBC 8.7 11/24/2020 1210  ? RBC 3.87 11/24/2020 1210  ? HGB 11.5 (L) 11/24/2020 1210  ? HGB 11.4 09/01/2020 1200  ? HCT 35.0 (L) 11/24/2020 1210  ? HCT 35.8 09/01/2020 1200  ? PLT 446.0  (H) 11/24/2020 1210  ? PLT 314 09/01/2020 1200  ? MCV 90.4 11/24/2020 1210  ? MCV 88 09/01/2020 1200  ? MCH 27.9 09/01/2020 1200  ? MCH 28.3 08/23/2020 0500  ? MCHC 33.0 11/24/2020 1210  ? RDW 15.0 11/24/2020 1210

## 2022-04-05 ENCOUNTER — Other Ambulatory Visit: Payer: Medicare Other

## 2022-04-05 DIAGNOSIS — Z515 Encounter for palliative care: Secondary | ICD-10-CM

## 2022-04-05 NOTE — Progress Notes (Signed)
COMMUNITY PALLIATIVE CARE SW NOTE ? ?PATIENT NAME: [name] ?DOB: May 02, 1932 ?MRN: [id number] ? ?PRIMARY CARE PROVIDER: Denita Lung, MD ? ?RESPONSIBLE PARTY:  ?Acct ID - Guarantor Home Phone Work Phone Relationship Acct Type  ?[id number] CAMDEN, MAZZAFERRO* [phone]  Self P/F  ?   [address], [place], [place] [address]  ? ?Due to the COVID-19 crisis, this virtual check-in visit was done via telephone from my office and it was initiated and consent by this patient and or family ? ? ?PC SW completed a telephonic encounter with patient and her daughter-[name]. Patient and daughter report that patient saw her PCP yesterday. He discussed with them that he was considering putting patient on lasix as patient is having increased swelling in her legs.  Patient's o2 was increased to 3 L continuous and o2 recertification was done. Patient's daughter also purchased support stockings for patient. Patient's portable o2 will also be replaced. Patient report that she is eating and sleeping better the last few days. Patient report increased fatigue and SOB with any exertion. [name] advised that she would like for patient to have a follow-up visit if she is placed on lasix. SW provided supportive presence, active listening, reassurance of support while assessing patient status and needs.  ? ? ?Katheren Puller, LCSW ? ?

## 2022-04-06 NOTE — Telephone Encounter (Signed)
Okay, thanks. We had discussed and her friend and she thought she had been on it before for leg swelling and said this wasn't a new problem. She does have dependent edema listed on her problem list from September of 2022, so maybe this is what they were referring to? Please advise her to schedule appt with her PCP to discuss her lower extremity swelling and determine if she needs a fluid pill, such as lasix. They would be the ones to manage this. Thanks!

## 2022-04-06 NOTE — Telephone Encounter (Signed)
Received the following message from patient's daughter:  ? ?"She has never had Lasix from Dr Redmond School or anybody. She was mistaken or did not hear the question well. If Lasix is needed on an as needed basis could Dr. Gustavus Bryant office order that for her? ?Thank you, ?[name]" ? ?I did search her chart since [facility] is apart of [facility] and did not see any mention of Lasix until her office visit on 04/04/22 with you on Wednesday.  ?

## 2022-04-12 ENCOUNTER — Other Ambulatory Visit: Payer: Medicare Other

## 2022-04-12 ENCOUNTER — Other Ambulatory Visit: Payer: Medicare Other | Admitting: Hospice

## 2022-04-12 ENCOUNTER — Other Ambulatory Visit: Payer: Medicare Other | Admitting: *Deleted

## 2022-04-12 VITALS — BP 148/80 | HR 89 | Temp 97.8°F | Resp 20

## 2022-04-12 DIAGNOSIS — Z515 Encounter for palliative care: Secondary | ICD-10-CM

## 2022-04-12 DIAGNOSIS — J449 Chronic obstructive pulmonary disease, unspecified: Secondary | ICD-10-CM | POA: Diagnosis not present

## 2022-04-12 DIAGNOSIS — R600 Localized edema: Secondary | ICD-10-CM

## 2022-04-12 DIAGNOSIS — F419 Anxiety disorder, unspecified: Secondary | ICD-10-CM

## 2022-04-12 NOTE — Progress Notes (Signed)
? ? ?  Manufacturing engineer ?Community Palliative Care Consult Note ?Telephone: [phone]  ?Fax: [phone] ? ?PATIENT NAME: [name] ?DOB: Feb 28, 1932 ?MRN: [id number] ? ?PRIMARY CARE PROVIDER:   Denita Lung, MD ?Denita Lung, MD ?[address] ?[place],  [place] [address] ? ?REFERRING PROVIDER: Denita Lung, MD ?Denita Lung, MD ?[address] ?[place],  [place] [address] ? ?RESPONSIBLE PARTY:    [name] is UGQBV [phone] ?Contact Information   ? ? Name Relation Home Work Mobile  ? [name],[name] Daughter   [phone]  ? [name] Sister   [phone]  ? ?  ? ?This visit already recorded and billed - 04/12/22 ?

## 2022-04-12 NOTE — Progress Notes (Signed)
? ? ?Manufacturing engineer ?Community Palliative Care Consult Note ?Telephone: [phone]  ?Fax: [phone] ? ?PATIENT NAME: [name] ?DOB: 1932-09-04 ?MRN: [id number] ? ?PRIMARY CARE PROVIDER:   Denita Lung, MD ?Denita Lung, MD ?[address] ?[place],  [place] [address] ? ?REFERRING PROVIDER: Denita Lung, MD ?Denita Lung, MD ?[address] ?[place],  [place] [address] ? ?RESPONSIBLE PARTY:    [name] is TAVWP [phone] ?Contact Information   ? ? Name Relation Home Work Mobile  ? [name],[name] Daughter   [phone]  ? [name] Sister   [phone]  ? ?  ? ?TELEHEALTH VISIT STATEMENT ?Due to the COVID-19 crisis, this visit was done via telemedicine from my office and it was initiated and consent by this patient and or family.  ?I connected with patient OR PROXY by a telephone  and verified that I am speaking with the correct person. I discussed the limitations of evaluation and management by telemedicine. The patient expressed understanding and agreed to proceed. ?Palliative Care was asked to follow this patient to address advance care planning, complex medical decision making and goals of care clarification.  [place] Sandria Senter is with patient during visit. ?Visit is to build trust and highlight Palliative Medicine as specialized medical care for people living with serious illness, aimed at facilitating better quality of life through symptoms relief, assisting with advance care planning and complex medical decision making. This is a follow up visit. ? ?RECOMMENDATIONS/PLAN:  ? ?Advance Care Planning/Code Status:Patient is a Do Not Resuscitate.  ? ?Goals of Care: Goals of care include to maximize quality of life and symptom management.  MOST selections which include DO NOT RESUSCITATE, comfort measures, antibiotics if indicated, IV fluids for defined trial.,  No feeding tube. ?Palliative care team will continue to support patient, patient's family, and medical  team. ? ?Symptom management/Plan:   ?Edema: Bilateral LE. Compression hose in use. Elevate BLE. Adhere to salt/fluid limits. Lasix 20 mg daily x 3 days, may extend it to 5 days. 10 MEQ Potassium chloride daily while taking Lasix. Follow up with PCP within a week. ?Routine CBC BMP.  ?COPD: Followed by Pulmonologist. Continue oxygen supplementation as ordered. OTC Mucinex as needed for cough. Continue Prednisone and breathing treatments as ordered. Education on slow deep breathing and appropriate use of Albuterol.  ?Anxiety:Managed with Xanax.  Use approach, redirection and de-escalation. ?Follow up: Palliative care will continue to follow for complex medical decision making, advance care planning, and clarification of goals. Return 6 weeks or prn. Encouraged to call provider sooner with any concerns. ?CHIEF COMPLAINT: Palliative follow up ? ?HISTORY OF PRESENT ILLNESS:  [name] a 88 y.o. female with multiple medical problems including COPD, anxiety, chronic respiratory failure with hypoxia.  Report of edema to BLE . Patient denies pain/discomfort, endorses shortness of breath on exertion, requiring assistance in activities of daily living.  History obtained from review of EMR, discussion with primary team, family and/or patient. Records reviewed and summarized above. All 10 point systems reviewed and are negative except as documented in history of present illness above ? ?Review and summarization of Epic records shows history from other than patient.  ? ?Palliative Care was asked to follow this patient o help address complex decision making in the context of advance care planning and goals of care clarification. I reviewed, as needed, available labs, patient records, imaging, studies and related documents from the EMR.   ? ? ?PERTINENT MEDICATIONS:  ?Outpatient Encounter Medications as  of 04/12/2022  ?Medication Sig  ? acetaminophen (TYLENOL) 650 MG CR tablet Take 650 mg by mouth every 8 (eight) hours as  needed for pain.  ? albuterol (PROVENTIL) (2.5 MG/3ML) 0.083% nebulizer solution USE 1 VIAL VIA NEBULIZER EVERY 6 HOURS AS NEEDED FOR WHEEZING OR SHORTNESS OF BREATH DX: J44.9  ? albuterol (VENTOLIN HFA) 108 (90 Base) MCG/ACT inhaler INHALE 2 PUFFS BY MOUTH EVERY 4 HOURS AS NEEDED FOR WHEEZING OR SHORTNESS OF BREATH  ? ALPRAZolam (XANAX) 0.25 MG tablet Take 1 tablet (0.25 mg total) by mouth 2 (two) times daily as needed for anxiety.  ? azithromycin (ZITHROMAX Z-PAK) 250 MG tablet Take as directed  ? azithromycin (ZITHROMAX) 250 MG tablet Take 1 tablet (250 mg total) by mouth daily.  ? brimonidine (ALPHAGAN) 0.2 % ophthalmic solution Place 1 drop into the right eye 3 (three) times daily.   ? cetirizine (ZYRTEC) 10 MG tablet Take 10 mg by mouth daily.  ? Cholecalciferol (VITAMIN D) 50 MCG (2000 UT) CAPS Take 1 capsule by mouth daily.  ? dextromethorphan-guaiFENesin (MUCINEX DM) 30-600 MG per 12 hr tablet Take 1 tablet by mouth at bedtime as needed (with flutter for cough, congestion, and thick mucus).   ? formoterol (PERFOROMIST) 20 MCG/2ML nebulizer solution Inhale one vial in nebulizer twice a day.  ? omeprazole (PRILOSEC) 20 MG capsule Take 20 mg by mouth daily.  ? OXYGEN Use 2L of O2 continuously  ? predniSONE (DELTASONE) 10 MG tablet TAKE 1/2 TABLET BY MOUTH DAILY, IF SYMPTOMS WORSEN TAKE 1 TABLET DAILY.  ? predniSONE (DELTASONE) 10 MG tablet Take 1 tablet (10 mg total) by mouth daily with breakfast.  ? revefenacin (YUPELRI) 175 MCG/3ML nebulizer solution Inhale one vial in nebulizer once daily. Do not mix with other nebulized medications.  ? ?Facility-Administered Encounter Medications as of 04/12/2022  ?Medication  ? influenza  inactive virus vaccine (FLUZONE/FLUARIX) injection 0.5 mL  ? ? ?HOSPICE ELIGIBILITY/DIAGNOSIS: TBD ? ?PAST MEDICAL HISTORY:  ?Past Medical History:  ?Diagnosis Date  ? Asthma   ? COPD (chronic obstructive pulmonary disease) ([place]) 01/2010  ? Dr. Melvyn Novas;  ? Cor pulmonale [facility])   ? Former  smoker   ? quit 02/2013  ? H/O bone density study 2008  ? osteoporosis, improved density on Fosamax at that time  ? Hypoxia 01/2013  ? hospitalization, Bipap therapy, COPD exacerbation  ? Osteoporosis   ?  ? ? ?ALLERGIES: No Known Allergies   ? ?Thank you for the opportunity to participate in the care of SHERHONDA GASPAR Please call our office at [phone] if we can be of additional assistance. ? ?Note: Portions of this note were generated with Lobbyist. Dictation errors may occur despite best attempts at proofreading. ? ?Teodoro Spray, NP ? ?  ?

## 2022-04-13 NOTE — Progress Notes (Signed)
COMMUNITY PALLIATIVE CARE SW NOTE ? ?PATIENT NAME: [name] ?DOB: 25-Jun-1932 ?MRN: [id number] ? ?PRIMARY CARE PROVIDER: Denita Lung, MD ? ?RESPONSIBLE PARTY:  ?Acct ID - Guarantor Home Phone Work Phone Relationship Acct Type  ?[id number] [name], [name]* [phone]  Self P/F  ?   [address], [place], [place] [address]  ? ?SOCIAL WORK VISIT ? ?PC SW, RN-M. Howard and NP-L. Eshiet (virtually) completed a visit with patient and her daughter-[name] (virtually). Patient and daughter provided a status update on patient since her physician visit a week ago. The NP discussed putting patient on lasix as patient is having increased swelling in her legs, but looked better today. Patient's o2 was increased from 2L to 3L continuous.  Patient's daughter also purchased zip compression stockings for patient which has been helpful with the swelling. Patient's portable o2  tank (Innogene) will also be replaced as her o2 is increased. Patient report increased fatigue and SOB with any exertion. The RN and NP completed an assessment and new orders for lasix was given. Patient continues to live alone, independently with  daily contact from high caregivers.  ?SW provided supportive presence, assessment of needs and comfort, active listening, reassurance of support.  ? ?Duration of visit and documentation: 60 minutes. ? ?Katheren Puller, LCSW ? ?

## 2022-04-16 ENCOUNTER — Other Ambulatory Visit: Payer: Medicare Other | Admitting: Hospice

## 2022-04-16 NOTE — Progress Notes (Signed)
[place] PALLIATIVE CARE RN NOTE ? ?PATIENT NAME: [name] ?DOB: 1932-04-13 ?MRN: [id number] ? ?PRIMARY CARE PROVIDER: Denita Lung, MD ? ?RESPONSIBLE PARTY: [name] (daughter) ?Acct ID - Guarantor Home Phone Work Phone Relationship Acct Type  ?[id number] [name], [name]* [phone]  Self P/F  ?   [address], [place], [place] [address]  ? ?Covid-19 Pre-screening Negative ? ?Joint visit made with LCSW M. Lonon and patient in her home. Conferenced in [place] NP via telehealth (video) to address patient edema. See NP note for full details of visit. ? ? ?PHYSICAL EXAM:  ? ?LUNGS: clear to auscultation  ?CARDIAC: Cor RRR ?EXTREMITIES: 1+ edema in left lower extremity, trace edema in right lower extremity ?SKIN:  Thin/frail skin that bruises easily   ?NEURO:  Alert and oriented x 3, forgetful, pleasant mood, generalized weakness, ambulatory ? ? ?(Duration of visit and documentation 60 minutes) ? ? ? ?Daryl Eastern, RN BSN ? ?

## 2022-04-19 ENCOUNTER — Other Ambulatory Visit: Payer: Medicare Other | Admitting: *Deleted

## 2022-04-19 VITALS — BP 147/86 | HR 93 | Temp 97.9°F | Resp 20

## 2022-04-19 DIAGNOSIS — Z515 Encounter for palliative care: Secondary | ICD-10-CM

## 2022-04-23 NOTE — Progress Notes (Signed)
[facility]  PALLIATIVE CARE RN NOTE ? ?PATIENT NAME: [name] ?DOB: 1932/06/12 ?MRN: [id number] ? ?PRIMARY CARE PROVIDER: Denita Lung, MD ? ?RESPONSIBLE PARTY: [name] (daughter) ?Acct ID - Guarantor Home Phone Work Phone Relationship Acct Type  ?[id number] LAURINE, KUYPER* [phone]  Self P/F  ?   [address], [place], [place] [address]  ? ?Covid-19 Pre-screening Negative ? ?RN face-to-face visit completed with patient in her home to follow up on bilateral lower extremity edema. Palliative NP Laverda Sorenson ordered patient Lasix 20 mg last week along with 10 meq of Potassium due to increased edema. Patient took this for 5 days. Swelling improved. Trace edema noted in left lower extremity and no edema in right lower extremity. Encouraged patient to elevate her legs throughout the day, limit salt intake and continue with compression socks daily. She is now able to make it to MD appointments since her new Inogen machine has arrived. She had to get a bigger machine with a higher liter flow since she is now on 3L of oxygen continuously. No new issues or concerns. Communication sent to NP to provide patient update. Palliative care will continue to follow. ? ? ?Daryl Eastern, RN ? ?

## 2022-05-07 ENCOUNTER — Other Ambulatory Visit: Payer: Medicare Other | Admitting: *Deleted

## 2022-05-07 DIAGNOSIS — Z515 Encounter for palliative care: Secondary | ICD-10-CM

## 2022-05-07 NOTE — Progress Notes (Signed)
[place] PALLIATIVE CARE RN NOTE ? ?PATIENT NAME: [name] ?DOB: Mar 19, 1932 ?MRN: [id number] ? ?PRIMARY CARE PROVIDER: Denita Lung, MD ? ?RESPONSIBLE PARTY: [name] (daughter) ?Acct ID - Guarantor Home Phone Work Phone Relationship Acct Type  ?[id number] [name], [name]* [phone]  Self P/F  ?   [address], [place], [place] [address]  ? ?Due to the COVID-19 crisis, this virtual check-in visit was done via telephone from my office and it was initiated and consent by this patient and or family. ? ?RN telephonic encounter completed with patient's daughter [name]. She reports when initially asking her mom regarding her bilateral leg swelling she said they had gone down. Patient then stated that the left leg edema never went down. RN visited with patient on 04/19/22 after a 5 day course of Lasix 20 mg and it was effective in minimizing swelling in both lower extremities. She continues to wear compression socks that zip on the side, as they are easier for patient to put on by herself. Daughter lives out of town and would like to schedule an in person follow up visit for this month for patient. Visit scheduled for 05/16/22 '@12p'$ .  ? ? ?(Duration of visit and documentation 10 minutes) ? ? ?Daryl Eastern, RN BSN ? ?

## 2022-05-16 ENCOUNTER — Other Ambulatory Visit: Payer: Medicare Other | Admitting: *Deleted

## 2022-05-16 VITALS — BP 157/93 | HR 95 | Temp 97.8°F | Resp 24

## 2022-05-16 DIAGNOSIS — M2041 Other hammer toe(s) (acquired), right foot: Secondary | ICD-10-CM | POA: Diagnosis not present

## 2022-05-16 DIAGNOSIS — I739 Peripheral vascular disease, unspecified: Secondary | ICD-10-CM | POA: Diagnosis not present

## 2022-05-16 DIAGNOSIS — B351 Tinea unguium: Secondary | ICD-10-CM | POA: Diagnosis not present

## 2022-05-16 DIAGNOSIS — Z515 Encounter for palliative care: Secondary | ICD-10-CM

## 2022-05-17 ENCOUNTER — Telehealth: Payer: Self-pay

## 2022-05-17 NOTE — Progress Notes (Signed)
[facility] COMMUNITY PALLIATIVE CARE RN NOTE  PATIENT NAME: [name] DOB: August 15, 1932 MRN: [id number]  PRIMARY CARE PROVIDER: Denita Lung, MD  RESPONSIBLE PARTY: [name] (daughter) Acct ID - Guarantor Home Phone Work Phone Relationship Acct Type  [id number] [name], [name]* [phone]  Self P/F     [address] [place], [place] [address]   RN follow up visit completed with patient in her home.  Edema: Increased pitting edema noted to bilateral feet/ankles up to right below her calf. Left>Right. Left foot darker in color and patient states feels tight. 04/12/26 palliative NP prescribed Lasix 20 mg, Potassium 10 meq due to swelling x 5 days.. I followed up on 04/19/22 and this was effective. She is now more swollen than prior to course of Lasix. Wears compressions socks daily and admits that she does not always elevate her legs as directed other than lying in bed at night.  Respiratory: Remains on oxygen at 3L/min via [place]. Dyspnea with exertion unchanged and at baseline. Continues nebulizer treatment. Denies need for PRN Albuterol neb over past month.   Appetite remains good. Continues to ambulate using her rollator walking and is independent w/ADLs. Tires very easily. Called and spoke with [name] to provide update. She would like patient to have more Lasix if possible. Advised that I will need to discuss with PCP.  Called and spoke with Quita Skye at Dr. Lanice Shirts office to advise of above information regarding edema. He received a communication back from PCP stating that he would need to see patient in the office and have labwork done. PCP suggests patient continue with compression socks and encourage leg elevation. He advised for daughter to call and schedule an appointment for when she is in town the week of May 28, 2022.  Communication sent to daughter to advise of what PCP said. She is appreciative and will schedule patient appointment.   PHYSICAL EXAM:   VITALS: Today's  Vitals   05/16/22 1247  BP: (!) 157/93  Pulse: 95  Resp: (!) 24  Temp: 97.8 F (36.6 C)  TempSrc: Temporal  SpO2: 95%  PainSc: 0-No pain    LUNGS:  slight expiratory wheeze noted in all lung fields; sob w/exertion CARDIAC: Cor RRR EXTREMITIES: pitting edema noted to bilateral lower feet/ankles up to right below calf L>R; left foot darker in appearance SKIN:  Thin frail skin; scattered bruises   NEURO:  Alert and oriented x 3, pleasant mood, forgetful, generalized weakness, ambulatory w/walker   (Duration of visit and documentation 60 minutes)    Daryl Eastern, RN BSN

## 2022-05-17 NOTE — Telephone Encounter (Signed)
[name] Nurse with [facility] care called stated the pt. Has some swelling in both feet below the calf and the lt. Leg/foot feels tighter than the rt. Pt. Has some pitting in both lower legs. They did tell her to keep them elevated and wear her compression socks. She said a nurse with [facility] called in a temporary amount of Lasix and Potassium before to help but they wanted her primary doctor to take over this for her now. She can not come into the office until her daughter is back in town which would be 05/28/22.

## 2022-05-22 ENCOUNTER — Ambulatory Visit (INDEPENDENT_AMBULATORY_CARE_PROVIDER_SITE_OTHER): Payer: Medicare Other | Admitting: Family Medicine

## 2022-05-22 ENCOUNTER — Encounter: Payer: Self-pay | Admitting: Family Medicine

## 2022-05-22 VITALS — BP 128/62 | HR 99 | Temp 99.0°F

## 2022-05-22 DIAGNOSIS — R609 Edema, unspecified: Secondary | ICD-10-CM | POA: Diagnosis not present

## 2022-05-22 NOTE — Progress Notes (Signed)
   Subjective:    Patient ID: [name], female    DOB: 21-Feb-1932, 88 y.o.   MRN: [id number]  HPI She is here for evaluation of peripheral edema.  This has been going on for quite some time.  She now has support stockings that zipped off and on and does have them in place today.  She has had no worsening of her shortness of breath.   Review of Systems     Objective:   Physical Exam Alert and in no distress.  Nasal cannula in place.  Cardiac exam shows a regular rhythm without murmurs or gallops.  Exam of her extremities does show minimal edema underneath the support stockings and slight edema distal where the stocking ends.       Assessment & Plan:  Peripheral edema I explained this is the best way to handle her edema and it seems to be working quite nicely as she has no pitting edema except distally where the stocking ends.  Recommend she get stockings that go the entire length not just to the midportion of her foot.

## 2022-05-29 ENCOUNTER — Ambulatory Visit: Payer: Medicare Other | Admitting: Podiatry

## 2022-06-20 ENCOUNTER — Other Ambulatory Visit: Payer: Medicare Other | Admitting: *Deleted

## 2022-07-02 ENCOUNTER — Ambulatory Visit (INDEPENDENT_AMBULATORY_CARE_PROVIDER_SITE_OTHER): Payer: Medicare Other | Admitting: Internal Medicine

## 2022-07-02 ENCOUNTER — Encounter: Payer: Self-pay | Admitting: Internal Medicine

## 2022-07-02 DIAGNOSIS — J449 Chronic obstructive pulmonary disease, unspecified: Secondary | ICD-10-CM

## 2022-07-02 DIAGNOSIS — R0609 Other forms of dyspnea: Secondary | ICD-10-CM | POA: Diagnosis not present

## 2022-07-02 DIAGNOSIS — J9611 Chronic respiratory failure with hypoxia: Secondary | ICD-10-CM

## 2022-07-02 DIAGNOSIS — J9612 Chronic respiratory failure with hypercapnia: Secondary | ICD-10-CM | POA: Diagnosis not present

## 2022-07-02 LAB — CBC WITH DIFFERENTIAL/PLATELET
Basophils Absolute: 0 10*3/uL (ref 0.0–0.1)
Basophils Relative: 0.2 % (ref 0.0–3.0)
Eosinophils Absolute: 0.1 10*3/uL (ref 0.0–0.7)
Eosinophils Relative: 0.7 % (ref 0.0–5.0)
HCT: 34.9 % — ABNORMAL LOW (ref 36.0–46.0)
Hemoglobin: 11.3 g/dL — ABNORMAL LOW (ref 12.0–15.0)
Lymphocytes Relative: 6.9 % — ABNORMAL LOW (ref 12.0–46.0)
Lymphs Abs: 0.6 10*3/uL — ABNORMAL LOW (ref 0.7–4.0)
MCHC: 32.4 g/dL (ref 30.0–36.0)
MCV: 92 fl (ref 78.0–100.0)
Monocytes Absolute: 0.4 10*3/uL (ref 0.1–1.0)
Monocytes Relative: 5.2 % (ref 3.0–12.0)
Neutro Abs: 7.1 10*3/uL (ref 1.4–7.7)
Neutrophils Relative %: 87 % — ABNORMAL HIGH (ref 43.0–77.0)
Platelets: 204 10*3/uL (ref 150.0–400.0)
RBC: 3.79 Mil/uL — ABNORMAL LOW (ref 3.87–5.11)
RDW: 15.9 % — ABNORMAL HIGH (ref 11.5–15.5)
WBC: 8.2 10*3/uL (ref 4.0–10.5)

## 2022-07-02 LAB — BASIC METABOLIC PANEL
BUN: 15 mg/dL (ref 6–23)
CO2: 35 mEq/L — ABNORMAL HIGH (ref 19–32)
Calcium: 9.8 mg/dL (ref 8.4–10.5)
Chloride: 101 mEq/L (ref 96–112)
Creatinine, Ser: 0.75 mg/dL (ref 0.40–1.20)
GFR: 71.39 mL/min (ref >60.00)
Glucose, Bld: 98 mg/dL (ref 70–99)
Potassium: 4.3 mEq/L (ref 3.5–5.1)
Sodium: 141 mEq/L (ref 135–145)

## 2022-07-02 LAB — TSH: TSH: 1.43 u[IU]/mL (ref 0.35–5.50)

## 2022-07-02 LAB — BRAIN NATRIURETIC PEPTIDE: Pro B Natriuretic peptide (BNP): 50 pg/mL (ref 0.0–100.0)

## 2022-07-02 NOTE — Patient Instructions (Signed)
Please remember to go to the lab department   for your tests - we will call you with the results when they are available.      Please schedule a follow up visit in 12  months but call sooner if needed

## 2022-07-02 NOTE — Progress Notes (Unsigned)
Subjective:    Patient ID: [name]     DOB: Apr 06, 1932     MRN: [id number]  Brief patient profile:  23 yowf quit smoking 02/2013  referred 02/08/2012 by Glade Lloyd for intermittent sob was on 02 but stopped in early 2013 and with GOLD III COPD documented 03/2012     History of Present Illness  11/09/2016  f/u ov/Daiwik Buffalo re:   GOLD III / maint perforomist/ bud/Incruse/ prn saba neb  Chief Complaint  Patient presents with   Follow-up    Pt called on 11/05/16 with c/o cough with yellow sputum- zpack and pred taper given. She is much improved and barely coughing at all at this point. Breathing is at her normal baseline.    baseline doe = MMRC3 = can't walk 100 yards even at a slow pace at a flat grade s stopping due to sob  Even on 02  rec For flares of cough/ wheeze/ short of breath assoc with change in mucus as you have in the past  >>> zpak / Prednisone 10 mg take  4 each am x 2 days,   2 each am x 2 days,  1 each am x 2 days and stop     11/12/2017  f/u ov/Jorge Amparo re:  GOLD III/ maint  rx - took one cycle zpak /pred since last ov/ using med cal well / 2lpm 24/ 7 continuous, not pulsed Chief Complaint  Patient presents with   Follow-up    2 liters O2, some sob with activity  MMRC3 = can't walk 100 yards even at a slow pace at a flat grade s stopping due to sob   No need for saba on perf/bud/incruse  rec trelegy     05/14/2019  f/u ov/Netanel Yannuzzi re: GOLD III spirometry/ 02 dep hs and with activity  Chief Complaint  Patient presents with   Follow-up    States her breathing has been mostly good. She has good days and bad days. Reports a congested cough. Wants to qualify for innogen.   Dyspnea:  MMRC3 = can't walk 100 yards even at a slow pace at a flat grade s stopping due to sob  - can do mailbox and back flat 50 ft on 02 2lpm  Cough: more rattling x sev days / no color/ not using flutter  Sleeping:  Props up x pillows / bed is flat  SABA use:   02: 2lpm  rec Prednisone 10 mg take   4 each am x 2 days,   2 each am x 2 days,  1 each am x 2 days and stop  For cough/congestion > mucinex dm up to 1200 mg every 12 hours and use the flutter valve as much as possbile Only use your albuterol as a rescue medication      08/10/2019  f/u ov/Tahjai Schetter re:  GOLD III   spirometry  But  02 dep 2lpm 24/7  maint on trelegy  Chief Complaint  Patient presents with   Follow-up    Patient reports that she is doing better. She still has sob and cough. She reports not having to use her rescue inhaler.  Dyspnea:  MMRC3 = can't walk 100 yards even at a slow pace at a flat grade s stopping due to sob   Cough: none   Sleeping: props up on 2 pillows  SABA use: none  02: 2lpm 24/7 does not titrate     08/09/2020  f/u ov/Maryama Kuriakose re: GOLD III spirometry / 02 dep /  maint trelegy  Chief Complaint  Patient presents with   Follow-up    pt wants to talk to about booster samples. pt wants samples for inhaler   Dyspnea:  MMRC3 = can't walk 100 yards even at a slow pace at a flat grade s stopping due to sob  = walking room to room  Cough: not much/ slt rattle  Sleeping: bed is flat/ 2 pillows SABA use: none 02: 2lpm 24/ 7 does not titrate  rec No change rx   02/07/2021  f/u ov/Monya Kozakiewicz re:  aecopd  On anoro  Chief Complaint  Patient presents with   Follow-up    Increased cough over the past 2 days- prod with clear sputum. She is using her albuterol inhaler once per day on average and neb with albuterol 2 x per day.   Dyspnea:  Room to room / no outdoor walking  Cough: worse x 2 days esp hs and in am x 2 days > only min clear sputum Sleeping: bed is flat with wedge x 45 degrees  SABA use: only used 2 x days  02: 2lpm 24/7  Covid status:   vax x 3  rec For cough/ congestion >  mucinex 1200 mg every 12 hours and flutter valve and tylenol #3 if can't stop up to every 4 hours as needed  For nasty color mucus > zpak Try prilosec otc '20mg'$   Take 30-60 min before first meal of the day and Pepcid ac  (famotidine) 20 mg one @  bedtime until cough is completely gone for at least a week without the need for cough suppression Prednisone 10 mg take  4 each am x 2 days,   2 each am x 2 days,  1 each am x 2 days and stop      02/21/2021  Acute  ov/Ahsan Esterline re: anoro daily / zpak finishing  Chief Complaint  Patient presents with   Acute Visit    Increased SOB and cough x 2 days.   Dyspnea:  Able cross the house on 2.5 lpm = about 50 ft and not checking sats walking or adjusting 02  Cough: rattling / using flutter / mucinex / flutter / hypertonic saline  Sleeping: 45 degrees and resting ok s resp flares noct  SABA use: nebulizer every 4 hours  02: 2.5 24/7 Covid status:   vax x 3 rec Plan A = Automatic = Always= performist is twice daily in nebulizer      yupelri is each am in nebulizer (use anoro until then)  Predisone 10 mg 2 daily until better then one daily  Plan B = Backup (to supplement plan A, not to replace it) Only use your albuterol nebulizer  as a rescue medication  As needed for cough / congestion > mucinex dm/ flutter valve and hypertonic saline then tylenol #3 if all else fails    03/21/2021  f/u ov/Yuuki Skeens re: performist/ yupelri/pred 10 mg daily  Chief Complaint  Patient presents with   Follow-up    Breathing has improved since her last visit. She is still coughing-white sputum. She has not had to use her albuterol inhaler or neb.   Dyspnea:  Able to get around house easier  Cough: minimal white3  Sleeping: bed is flat/ 45 degree wedge   SABA use: minimal but neb  02: 2.5 lpm 24/7 does not monitor sats with activity  Covid status:   X 3  vax Rec     07/02/2022  f/u ov/Bita Cartwright re: GOLD  3 copd    maint on perforomist / yupelri  a zpak prn avg qom Prednisone 10 mg  Chief Complaint  Patient presents with   Follow-up    Pt states she has not been doing too good. Pt states she has no energy and now using a walker.   Dyspnea:  room to room with rollator on palliative care   Cough: min mucoid in am / slt smoker's rattle last used zpak on month prior to OV  with greens mucus which cleared  Sleeping: on a wedge/ one pillow  SABA use: none  02: 3lpm 247      No obvious day to day or daytime variability or assoc excess/ purulent sputum or mucus plugs or hemoptysis or cp or chest tightness, subjective wheeze or overt sinus or hb symptoms.   *** without nocturnal  or early am exacerbation  of respiratory  c/o's or need for noct saba. Also denies any obvious fluctuation of symptoms with weather or environmental changes or other aggravating or alleviating factors except as outlined above   No unusual exposure hx or h/o childhood pna/ asthma or knowledge of premature birth.  Current Allergies, Complete Past Medical History, Past Surgical History, Family History, and Social History were reviewed in [employer] record.  ROS  The following are not active complaints unless bolded Hoarseness, sore throat, dysphagia, dental problems, itching, sneezing,  nasal congestion or discharge of excess mucus or purulent secretions, ear ache,   fever, chills, sweats, unintended wt loss or wt gain, classically pleuritic or exertional cp,  orthopnea pnd or arm/hand swelling  or leg swelling, presyncope, palpitations, abdominal pain, anorexia, nausea, vomiting, diarrhea  or change in bowel habits or change in bladder habits, change in stools or change in urine, dysuria, hematuria,  rash, arthralgias, visual complaints, headache, numbness, weakness or ataxia or problems with walking or coordination = using rollator ,  change in mood or  memory.        No outpatient medications have been marked as taking for the 07/02/22 encounter (Office Visit) with Tanda Rockers, MD.                   Objective:   Physical Exam   07/02/2022   132  03/21/2021    135  02/21/2021      131 02/07/2021    133 08/09/2020    135  08/10/2019    137  05/14/2019  140  Wt 97 02/08/2012 >  03/24/2012  99 > 103 05/09/2012 > 06/12/2012  99 >   09/24/2012  96 > 98 01/05/2013 >  106 05/01/2013 > 05/05/2013  99 > 05/15/2013  104 > 06/01/2013 115 > 115 07/07/2013 > 114  07/17/13 > 127 09/18/2013 > 12/01/2013  131 >135 03/04/2014 > 03/12/2014  133 > 05/27/2014  133  > 06/28/2014  133 >131 07/26/2014 >133 09/21/2014 139 >  04/14/2015  138  >  05/08/2016  146 > 05/10/2017    145 >  11/12/2017  134 >  11/03/2018   144 >   Vital signs reviewed  07/02/2022  - Note at rest 02 sats  ***% on ***   General appearance:    elderly wf w/c bound       5cmod        Assessment & Plan:

## 2022-07-03 ENCOUNTER — Encounter: Payer: Self-pay | Admitting: Internal Medicine

## 2022-07-03 NOTE — Assessment & Plan Note (Signed)
Quit smoking 02/2013   - PFTs 03/24/2012  FEV1  0.91 (43%) ratio 48 and no better p B2,  DLCO 38% corrects to 53%  - changed to perforomist/bud bid 04/2013  -2015 pulmonary rehab  - med calendar 07/17/13 > did not bring 06/28/2014 , .redone 07/26/2014  - 06/28/2014 p extensive coaching HFA effectiveness =    75% (short Ti) - 08/24/14 started IMPACT trial >finished 08/2015  - PFTs 12/13/14  FEV1 0.85 (38%) ratio 45  p 5 % improvement from saba  09/20/2015 Med calendar  - 05/08/2016 changed lama to incruse  - 11/12/2017  After extensive coaching HFA effectiveness =   75% (short Ti) - 11/12/17 > changed to trelegy   - 02/07/2021  Flare of cough on anoro >  Try gerd rx  - Prednisone ceiling of 20 and floor of 10 mg daily 02/16/21  - 02/21/2021 changed to  performist bid and yupelri q am > improved 03/21/2021    Group D (now reclassified as E) in terms of symptom/risk and laba/lama/ICS  therefore appropriate rx at this point >>>  Bud/formoterol and pred 10 mg daily now stabilized though very debilitated and mostly w/c bound x for room to room with rollator at home. No change rx needed at this point

## 2022-07-03 NOTE — Progress Notes (Signed)
Spoke with pt and notified of results per Dr. Wert. Pt verbalized understanding and denied any questions. 

## 2022-07-03 NOTE — Assessment & Plan Note (Signed)
Onset 02 rx 2013 initially just at hs   - ono RA 05/14/12   4 h 33 min < 89%   So ordered 02 2lpm    - HCO3  32 05/10/13    - 01/05/2013   Walked RA x one lap @ 185 stopped due to  88%   - 01/05/2013  Walked 2lpm 2 laps @ 185 ft each stopped due to  90% sob but improved vs baseline   - RA 05/01/2013 sats = 87%  - 05/10/2017 > 02 sats 88% RA > referred for POC eval > preferred continuous flow - 05/14/2019   Walked RA  1 laps @  approx 146f each @ slow pace  stopped due to sob and desats to 84% corrected on 2lpm and completed lap s desats -  HCO3    07/02/2022   = 35   Though somewhat paradoxic, when the lung fails to clear C02 properly and pC02 rises the lung then becomes a more efficient scavenger of C02 allowing lower work of breathing and  better C02 clearance albeit at a higher serum pC02 level - this is why pts can look a lot better than their ABG's would suggest and why it's so difficult to prognosticate endstage dz.  It's also why I strongly rec DNI status (ventilating pts down to a nl pC02 adversely affects this compensatory mechanism)   No change rx needed, f/u in office can be yearly as on palliative care program now          Each maintenance medication was reviewed in detail including emphasizing most importantly the difference between maintenance and prns and under what circumstances the prns are to be triggered using an action plan format where appropriate.  Total time for H and P, chart review, counseling, reviewing neb/02  device(s) and generating customized AVS unique to this office visit / same day charting = 21 min

## 2022-07-18 ENCOUNTER — Other Ambulatory Visit: Payer: Medicare Other | Admitting: *Deleted

## 2022-07-18 VITALS — BP 138/74 | HR 90 | Temp 97.8°F | Resp 20

## 2022-07-18 DIAGNOSIS — Z515 Encounter for palliative care: Secondary | ICD-10-CM

## 2022-07-18 NOTE — Progress Notes (Signed)
[facility]UN[facility] PALLIATIVE CARE RN NOTE  PATIENT NAME: DEVINA BEZOLD DOB: 26-Feb-1932 MRN: [id number]  PRIMARY CARE PROVIDER: Denita Lung, MD  RESPONSIBLE PARTY: [name] (daughter) Acct ID - Guarantor Home Phone Work Phone Relationship Acct Type  [id number] SAVAYAH, WALTRIP* [phone]  Self P/F     [address] [place], [place] [address]   RN follow up visit completed with patient today in her home.  Respiratory: Continues with shortness of breath with any exertion. Requires frequent rest periods. Says "I can't do anything anymore." Currently on 3L/min via [place] continuously. Lungs clear, no wheezing. Continues on Prednisone 10 mg daily and Perforomist neb twice daily. She has not required her prn Albuterol neb. Occasional productive cough. Sputum is clear.  Cardiovascular: No edema noted today. Compression stockings on and patient says are working well. Continue to encourage leg elevation during the day.  Appetite: Reports having a good appetite and is drinking her fluids. Daughter reports patient had an episode this month where she thinks her blood sugar drops due to not eating as she should. She has hired more help in the home to make sure that someone prepares 3 meals/day for patient daily.  Mobility: Remains ambulatory using her rollator walker. Able to perform ADLs independently, just takes a long time due to need to rest in between tasks. No recent falls.  Daughter contacted while at visit and provided with patient update. Palliative care will continue to follow patient. Next visit scheduled for 08/22/22 at 12p.    PHYSICAL EXAM:   VITALS: Today's Vitals   07/18/22 1432  BP: 138/74  Pulse: 90  Resp: 20  Temp: 97.8 F (36.6 C)  TempSrc: Temporal  SpO2: 96%  PainSc: 0-No pain    LUNGS: clear to auscultation , occasional nonproductive cough with clear sputum CARDIAC: Cor RRR EXTREMITIES: No edema; compression stockings on SKIN:  Thin/frail skin; no open  areas or skin breakdown noted   NEURO:  Alert and oriented x 3, forgetful at times, ambulatory w/rollator walker.    Daryl Eastern, RN BSN

## 2022-08-22 ENCOUNTER — Other Ambulatory Visit: Payer: Medicare Other | Admitting: *Deleted

## 2022-08-29 ENCOUNTER — Encounter: Payer: Self-pay | Admitting: Internal Medicine

## 2022-09-12 ENCOUNTER — Other Ambulatory Visit (INDEPENDENT_AMBULATORY_CARE_PROVIDER_SITE_OTHER): Payer: Medicare Other

## 2022-09-12 DIAGNOSIS — Z23 Encounter for immunization: Secondary | ICD-10-CM | POA: Diagnosis not present

## 2022-09-13 ENCOUNTER — Other Ambulatory Visit: Payer: Self-pay | Admitting: *Deleted

## 2022-09-13 MED ORDER — PREDNISONE 10 MG PO TABS: 10.0000 mg | ORAL_TABLET | Freq: Every day | ORAL | 5 refills | Status: DC

## 2022-09-19 ENCOUNTER — Other Ambulatory Visit: Payer: Medicare Other | Admitting: *Deleted

## 2022-09-19 VITALS — BP 150/80 | HR 98 | Temp 97.9°F | Resp 20

## 2022-09-19 DIAGNOSIS — Z515 Encounter for palliative care: Secondary | ICD-10-CM

## 2022-09-24 ENCOUNTER — Ambulatory Visit: Payer: Self-pay

## 2022-09-24 NOTE — Patient Instructions (Signed)
Visit Information  Thank you for taking time to visit with me today. Please don't hesitate to contact me if I can be of assistance to you.   Following are the goals we discussed today:   Goals Addressed             This Visit's Progress    COMPLETED: Care Coordination Activities       Care Coordination Interventions: SDoH screening completed - no acute resource challenges identified at this time Discussed the patient doe not have concerns with medication costs and/or adherence at this time Education provided on the role of the care coordination team - no follow up desired at this time Instructed the patient to contact her primary care provider as needed         If you are experiencing a Mental Health or Roff or need someone to talk to, please call 1-800-273-TALK (toll free, 24 hour hotline)  Patient verbalizes understanding of instructions and care plan provided today and agrees to view in [place]. Active MyChart status and patient understanding of how to access instructions and care plan via MyChart confirmed with patient.     No further follow up required: Please contact your primary care provider as needed  Daneen Schick, Arita Miss, CDP Social Worker, Certified Dementia Practitioner [facility] [phone]

## 2022-09-24 NOTE — Patient Outreach (Signed)
  Care Coordination   Initial Visit Note   09/24/2022 Name: [name] MRN: [id number] DOB: 08/20/1932  [name] is a 88 y.o. year old female who sees Debra Lung, MD for primary care. I spoke with  [name] by phone today.  What matters to the patients health and wellness today?  No concerns, doing well at this time    Goals Addressed             This Visit's Progress    COMPLETED: Care Coordination Activities       Care Coordination Interventions: SDoH screening completed - no acute resource challenges identified at this time Discussed the patient doe not have concerns with medication costs and/or adherence at this time Education provided on the role of the care coordination team - no follow up desired at this time Instructed the patient to contact her primary care provider as needed         SDOH assessments and interventions completed:  Yes  SDOH Interventions Today    Flowsheet Row Most Recent Value  SDOH Interventions   Food Insecurity Interventions Intervention Not Indicated  Housing Interventions Intervention Not Indicated  Transportation Interventions Intervention Not Indicated  Utilities Interventions Intervention Not Indicated        Care Coordination Interventions Activated:  Yes  Care Coordination Interventions:  Yes, provided   Follow up plan: No further intervention required.   Encounter Outcome:  Pt. Visit Completed   [name], BSW, CDP Social Worker, Certified Dementia Practitioner [facility]  Care Coordination [phone]

## 2022-09-25 NOTE — Progress Notes (Signed)
[facility] RN NOTE  PATIENT NAME: [name] DOB: 1932-09-16 MRN: [id number]  PRIMARY CARE PROVIDER: Denita Lung, MD  RESPONSIBLE PARTY: [name] (daughter) Acct ID - Guarantor Home Phone Work Phone Relationship Acct Type  [id number] [name]* [phone]  Self P/F     [address], [place], [place] [address]    RN follow up visit completed today with patient in her home.  Cognitive: Alert and oriented x 4. Pleasant mood. Forgetful at times.  Pain: Denies pain or discomfort  Respiratory: Notice that patient is becoming increasingly short of breath with exertion after answering the door. Taking longer to recover at rest. Remains on 3L/min via . Continues scheduled nebs. Denies need for prn neb treatments  Mobility: Remains ambulatory using her rollator walker. Able to perform ADLs independently. Requires frequent rest periods.  Appetite: Remains good. Has hired caregivers twice daily that comes in to check on patient and makes sure she eats 3 meals/day. Denies dysphagia. Taking medications without difficulty as prescribed.  Cardiovascular: Trace edema in bilateral lower extremities, L>R, but is stable. Continues with compression hose daily during the day and removes at night.  CODE STATUS:   Code Status: Prior  ADVANCED DIRECTIVES: N MOST FORM: yes PPS: 50%   PHYSICAL EXAM:   VITALS: Today's Vitals   09/19/22 1241  BP: (!) 150/80  Pulse: 98  Resp: 20  Temp: 97.9 F (36.6 C)  TempSrc: Temporal  SpO2: 96%    LUNGS: clear to auscultation  CARDIAC: Cor RRR  EXTREMITIES: Trace edema bilateral lower extremities L>R SKIN:  Exposed skin is dry and intact   NEURO:  Alert and oriented x 4, pleasant mood, forgetful, generalized weakness, ambulatory w/rollator walker    Debra Eastern, RN BSN

## 2022-10-02 ENCOUNTER — Encounter: Payer: Self-pay | Admitting: Internal Medicine

## 2022-10-10 ENCOUNTER — Other Ambulatory Visit: Payer: Medicare Other

## 2022-10-11 ENCOUNTER — Other Ambulatory Visit: Payer: Self-pay | Admitting: Internal Medicine

## 2022-10-15 ENCOUNTER — Encounter: Payer: Self-pay | Admitting: Internal Medicine

## 2022-10-17 ENCOUNTER — Other Ambulatory Visit (INDEPENDENT_AMBULATORY_CARE_PROVIDER_SITE_OTHER): Payer: Medicare Other

## 2022-10-17 DIAGNOSIS — Z23 Encounter for immunization: Secondary | ICD-10-CM | POA: Diagnosis not present

## 2022-10-18 ENCOUNTER — Other Ambulatory Visit: Payer: Medicare Other | Admitting: *Deleted

## 2022-11-02 ENCOUNTER — Encounter: Payer: Self-pay | Admitting: Podiatry

## 2022-11-02 ENCOUNTER — Ambulatory Visit (INDEPENDENT_AMBULATORY_CARE_PROVIDER_SITE_OTHER): Payer: Medicare Other | Admitting: Podiatry

## 2022-11-02 DIAGNOSIS — B351 Tinea unguium: Secondary | ICD-10-CM

## 2022-11-02 DIAGNOSIS — N289 Disorder of kidney and ureter, unspecified: Secondary | ICD-10-CM | POA: Diagnosis not present

## 2022-11-02 DIAGNOSIS — M79676 Pain in unspecified toe(s): Secondary | ICD-10-CM | POA: Diagnosis not present

## 2022-11-02 NOTE — Progress Notes (Signed)
This patient returns to my office for at risk foot care.  This patient requires this care by a professional since this patient will be at risk due to having  renal insufficiency.  This patient is unable to cut nails herself since the patient cannot reach her nails.These nails are painful walking and wearing shoes.  She presents to the office in a wheelchair with her daughter. This patient presents for at risk foot care today.    General Appearance  Alert, conversant and in no acute stress.  Vascular  Dorsalis pedis and posterior tibial  pulses are weakly  palpable  bilaterally.  Capillary return is within normal limits  Bilaterally. Cold feet. Bilaterally.  Absent digital hair  B/L.  Neurologic  Senn-Weinstein monofilament wire test diminished   bilaterally. Muscle power within normal limits bilaterally.  Nails Thick disfigured discolored nails with subungual debris  from hallux to fifth toes bilaterally. No evidence of bacterial infection or drainage bilaterally.  Orthopedic  No limitations of motion  feet .  No crepitus or effusions noted.  No bony pathology or digital deformities noted.  Skin  normotropic skin with no porokeratosis noted bilaterally.  No signs of infections or ulcers noted.   Brown discoloration dorsum left foot is fading.  Onychomycosis  Pain in right toes  Pain in left toes  Consent was obtained for treatment procedures.   Mechanical debridement of nails 1-5  bilaterally performed with a nail nipper.  Filed with dremel without incident.    Return office visit   prn                Told patient to return for periodic foot care and evaluation due to potential at risk complications.   Gardiner Barefoot DPM

## 2022-11-20 ENCOUNTER — Other Ambulatory Visit: Payer: Medicare Other | Admitting: *Deleted

## 2022-11-20 VITALS — BP 137/73 | HR 84 | Temp 97.8°F | Resp 20

## 2022-11-20 DIAGNOSIS — Z515 Encounter for palliative care: Secondary | ICD-10-CM

## 2022-11-30 NOTE — Progress Notes (Signed)
[facility] COMMUNITY PALLIATIVE CARE RN NOTE  PATIENT NAME: [name] DOB: 10-May-1932 MRN: [id number]  PRIMARY CARE PROVIDER: Denita Lung, MD  RESPONSIBLE PARTY: [name] (daughter) Acct ID - Guarantor Home Phone Work Phone Relationship Acct Type  [id number] TOCARRA, GASSEN* [phone]  Self P/F     [address] [place], [place] [address]    RN completed follow up visit with patient in her home.  Respiratory: Reports rattle in her throat. She has had to use her prn Albuterol nebulizer 2-3 times over the past month. She was having issues expectorating phlegm and nebulizer helped. Lungs sound clear. Congestion appears to be more in her throat area. Encouraged to continue drinking her fluids to help thin secretions. Continues on 3L/min. Noticing slight increase in dyspnea with exertion and taking longer to recover at rest. Encouraged patient to utilize her incentive spirometer at request of daughter.  Cardiovascular: Trace edema in left ankle but has been stable. Compression hose on.   Mobility: Remains independent with ADLs, but requiring frequent rest periods. Tires easily. Continues with in-home caregivers BID to make sure she eats 3 meals/day. Walking at a slower pace using her rollator walker.  Appetite: Feels her appetite is good, except in the mornings after she does her routine breathing treatments.   Goals of care: To remain in her home as long as possible. She has a DNR and a MOST form.  Next RN visit scheduled for 12/27/2022 @ at 12pm  PHYSICAL EXAM:   VITALS: Today's Vitals   11/20/22 1215  BP: 137/73  Pulse: 84  Resp: 20  Temp: 97.8 F (36.6 C)  TempSrc: Temporal  SpO2: 97%  PainSc: 0-No pain      Daryl Eastern, RN BSN

## 2022-12-27 ENCOUNTER — Other Ambulatory Visit: Payer: Medicare Other

## 2022-12-27 VITALS — BP 132/74 | HR 93 | Temp 97.5°F

## 2022-12-27 DIAGNOSIS — Z515 Encounter for palliative care: Secondary | ICD-10-CM

## 2022-12-27 NOTE — Progress Notes (Signed)
PATIENT NAME: [name] DOB: 1932/12/03 MRN: [id number]  PRIMARY CARE PROVIDER: Denita Lung, MD  RESPONSIBLE PARTY:  Acct ID - Guarantor Home Phone Work Phone Relationship Acct Type  [id number] BREIONA, [phone]  Self P/F     [address], [place], [place] [address]   Cardiovascular:  Continues with trace edema to left ankle.  Unchanged from last PC visit.  Compression stocking being worn routinely.   Mobility:  Using a rollator walker for safe mobility.  No recent falls.   Respiratory: Continues with breathing treatments 3x daily.  She has not required z-pak since October of 2023.  Continues with shortness of breath with exertion (walking and talking).  Tires easily and request frequent rests.  Currently on oxygen at 3 L via [place].  Continues taking mucinex bid.  Cough with sputum production but no color reported.   Weight loss:  I last saw patient 1 year ago.  She is visibly thinner.  Reviewed weights.  No recent weight since July of 2023.  Patient endorses eating 3 meals a day.  Breakfast is minimal per patient.  She continues to have caregivers come in 2 x daily.  They are assisting with meal preparations daily.  9/22-145 lbs 04/04/22- 137 lbs 07/02/22- 132 lbs  Phone call made to daughter [name] at patient's request.  Updated on today's visit.  Daughter has no new concerns at this time.  CODE STATUS: DNR-form in the home. ADVANCED DIRECTIVES: Yes MOST FORM: Yes PPS: 50%   PHYSICAL EXAM:   VITALS: Today's Vitals   12/27/22 1212  BP: 132/74  Pulse: 93  Temp: (!) 97.5 F (36.4 C)  SpO2: 92%    LUNGS: decreased breath sounds CARDIAC: Cor RRR}  EXTREMITIES: Left ankle with trace edema present. Compression stockings in place. SKIN: Skin color, texture, turgor normal. No rashes or lesions or normal  NEURO: positive for gait problems       Lorenza Burton, RN

## 2023-01-11 ENCOUNTER — Other Ambulatory Visit: Payer: Self-pay | Admitting: Internal Medicine

## 2023-01-16 ENCOUNTER — Other Ambulatory Visit: Payer: Medicare Other

## 2023-01-16 VITALS — BP 148/82 | HR 97 | Temp 97.5°F

## 2023-01-16 DIAGNOSIS — Z515 Encounter for palliative care: Secondary | ICD-10-CM

## 2023-01-16 NOTE — Progress Notes (Signed)
PATIENT NAME: [name] DOB: [birth year] MRN: [id number]  PRIMARY CARE PROVIDER: Denita Lung, MD  RESPONSIBLE PARTY:  Acct ID - Guarantor Home Phone Work Phone Relationship Acct Type  [id number] [name], [name]  Self P/F     [address], [place], [place] [address]  Home visit completed with patient.  Message received from daughter [name] at 8 am.  She has requested a visit with patient today to check bowel sounds.  Last bm was yesterday.  Patient consumed chili yesterday and abdomen did not feel well.  She was unable to pass much gas but did burp a few times.  GI:  Bowel sounds present x 4 quads.  No abdominal distention, nausea or vomiting.  Patient reports feeling better this morning but she was unable to sleep last night due to the abdominal pain.  She reports diffuse pain across her abdomen that lasted until 4 am.   Encouraged liquids with patient and to try a bland diet this morning to make sure there are no further issues with abdominal pain.   Respiratory:  Continues with shortness of breath with minimal exertion.  Remains on O2 at 3L via [name].  Minimal production with cough.  Compliant with breathing treatments.  Scattered rhonchi present to bilateral lobes.  Patient administered Yuperli during this visit.   Phone call made to daughter [name] during this visit to update on above.  Daughter aware of signs and symptoms to watch for if concern for bowel obstruction.    CODE STATUS: DNR ADVANCED DIRECTIVES: Yes MOST FORM: Yes PPS: 50%   PHYSICAL EXAM:   VITALS: Today's Vitals   01/16/23 0908  BP: (!) 148/82  Pulse: 97  Temp: (!) 97.5 F (36.4 C)  SpO2: 92%    LUNGS: scattered rhonchi bilaterally CARDIAC: Cor [place]}  EXTREMITIES: trace edema to left ankle SKIN: Skin color, texture, turgor normal. No rashes or lesions or temperature normal and texture normal  NEURO: positive for headaches       Lorenza Burton, RN

## 2023-01-25 ENCOUNTER — Other Ambulatory Visit: Payer: Medicare Other

## 2023-01-28 ENCOUNTER — Other Ambulatory Visit: Payer: Medicare Other

## 2023-01-28 VITALS — BP 130/76 | HR 89 | Temp 97.8°F

## 2023-01-28 DIAGNOSIS — Z515 Encounter for palliative care: Secondary | ICD-10-CM

## 2023-01-28 NOTE — Progress Notes (Signed)
PATIENT NAME: [name] DOB: 08-08-1932 MRN: [id number]  PRIMARY CARE PROVIDER: Denita Lung, MD  RESPONSIBLE PARTY:  Acct ID - Guarantor Home Phone Work Phone Relationship Acct Type  [id number] Debra, Booker* [phone]  Self P/F     [address] [place], [place] [address]    Joint visit completed with Lorelee Market, LPN as she will be following patient from this point forward.       Lorenza Burton, RN

## 2023-01-28 NOTE — Progress Notes (Signed)
PATIENT NAME: [name] DOB: Jul 25, 1932 MRN: [id number]  PRIMARY CARE PROVIDER: Denita Lung, MD  RESPONSIBLE PARTY:  Acct ID - Guarantor Home Phone Work Phone Relationship Acct Type  [id number] SHANNEL, [phone]  Self P/F     [address], [place], [place] [address]   Palliative Care Follow Up Encounter Note   Completed home visit with [name], RN.  Made call to [name] (dtr that lives in [place]) to update on visit. Follow up visit on 3.5.24 @ 12pm.    Visit info:  has standing order for Z-pak;   Cardiovascular: has trace edema in LLE; no diuretics on board but wears compression socks which the patient says helps with fluid retention  Respiratory: currently on 3L O2; can talk in full sentences;  Cognitive: A&O x3   Appetite/Wt Loss: eats 2-3 meals that are prepared by someone who comes in to her home to cook; today's wt is 114.8lbs; pt has a half sandwich on the dining room table that is in a plastic baggie - there is only one bite of the sandwich eaten   Mobility: walks with a rollator; balance issues without the rollator when she stood on the scale to get her weight   Sleeping Pattern: gets up only to use the bathroom twice during the night  Pain: denies pain now and none usually   CODE STATUS: DNR ADVANCED DIRECTIVES: Y MOST FORM: Y PPS: 50%   PHYSICAL EXAM:   VITALS: Today's Vitals   01/28/23 1209  BP: 130/76  Pulse: 89  Temp: 97.8 F (36.6 C)  TempSrc: Temporal  SpO2: 97%  PainSc: 0-No pain    LUNGS: clear to auscultation  CARDIAC: Cor RRR}  EXTREMITIES: LLE with trace edema noted SKIN: Skin color, texture, turgor normal. No rashes or lesions  NEURO: negative       Kandis Mannan, RN

## 2023-02-11 ENCOUNTER — Ambulatory Visit (INDEPENDENT_AMBULATORY_CARE_PROVIDER_SITE_OTHER): Payer: Medicare Other | Admitting: Medical

## 2023-02-11 ENCOUNTER — Other Ambulatory Visit (INDEPENDENT_AMBULATORY_CARE_PROVIDER_SITE_OTHER): Payer: Medicare Other

## 2023-02-11 DIAGNOSIS — R300 Dysuria: Secondary | ICD-10-CM | POA: Diagnosis not present

## 2023-02-11 LAB — POCT URINALYSIS DIP (PROADVANTAGE DEVICE)
Blood, UA: NEGATIVE
Glucose, UA: NEGATIVE mg/dL
Ketones, POC UA: NEGATIVE mg/dL
Nitrite, UA: NEGATIVE
Specific Gravity, Urine: 1.015
Urobilinogen, Ur: 0.2
pH, UA: 6 (ref 5.0–8.0)

## 2023-02-11 MED ORDER — SULFAMETHOXAZOLE-TRIMETHOPRIM 800-160 MG PO TABS: 1.0000 | ORAL_TABLET | Freq: Two times a day (BID) | ORAL | 0 refills | Status: DC

## 2023-02-11 NOTE — Progress Notes (Addendum)
Subjective:     Patient ID: [name], female   DOB: 01-Apr-1932, 89 y.o.   MRN: [id number]  This visit type was conducted due to national recommendations for restrictions regarding the COVID-19 Pandemic (e.g. social distancing) in an effort to limit this patient's exposure and mitigate transmission in our community.  Due to their co-morbid illnesses, this patient is at least at moderate risk for complications without adequate follow up.  This format is felt to be most appropriate for this patient at this time.    Documentation for virtual audio and video telecommunications through [place] encounter:  The patient was located at home. The provider was located in the office. The patient did consent to this visit and is aware of possible charges through their insurance for this visit.  The other persons participating in this telemedicine service were none. Time spent on call was 20 minutes and in review of previous records 20 minutes total.  This virtual service is not related to other E/M service within previous 7 days.   HPI Chief Complaint  Patient presents with   Dysuria   Virtual consult for UTI.   She reports possible UTI.  Started today with burning with urination, chills, urgency, and when she goes to urinate just a few a drops comes out.   No fever.  No NVD.  No belly or back pain.   No blood in urine.  Drinks a good amount of water.  No change in hygiene products.   No other aggravating or relieving factors. No other complaint.  Past Medical History:  Diagnosis Date   Asthma    COPD (chronic obstructive pulmonary disease) ([place]) 01/2010   Dr. Melvyn Novas;   Cor pulmonale [facility])    Former smoker    quit 02/2013   H/O bone density study 2008   osteoporosis, improved density on Fosamax at that time   Hypoxia 01/2013   hospitalization, Bipap therapy, COPD exacerbation   Osteoporosis    Current Outpatient Medications on File Prior to Visit  Medication Sig Dispense Refill    acetaminophen (TYLENOL) 650 MG CR tablet Take 650 mg by mouth every 8 (eight) hours as needed for pain.     albuterol (PROVENTIL) (2.5 MG/3ML) 0.083% nebulizer solution USE 1 VIAL VIA NEBULIZER EVERY 6 HOURS AS NEEDED FOR WHEEZING OR SHORTNESS OF BREATH DX: J44.9 150 mL 3   albuterol (VENTOLIN HFA) 108 (90 Base) MCG/ACT inhaler INHALE 2 PUFFS BY MOUTH EVERY 4 HOURS AS NEEDED FOR WHEEZING OR SHORTNESS OF BREATH 8.5 g 1   brimonidine (ALPHAGAN) 0.2 % ophthalmic solution Place 1 drop into the right eye 3 (three) times daily.  (Patient not taking: Reported on 12/27/2022)     cetirizine (ZYRTEC) 10 MG tablet Take 10 mg by mouth daily.     Cholecalciferol (VITAMIN D) 50 MCG (2000 UT) CAPS Take 1 capsule by mouth daily.     dextromethorphan-guaiFENesin (MUCINEX DM) 30-600 MG per 12 hr tablet Take 1 tablet by mouth at bedtime as needed (with flutter for cough, congestion, and thick mucus).      formoterol (PERFOROMIST) 20 MCG/2ML nebulizer solution Substituted for: Perforomist Solution Inhale one vial in nebulizer twice a day. 60 mL 11   omeprazole (PRILOSEC) 20 MG capsule Take 20 mg by mouth daily.     OXYGEN Use 2L of O2 continuously     predniSONE (DELTASONE) 10 MG tablet Take 1 tablet (10 mg total) by mouth daily with breakfast. 30 tablet 5   revefenacin (YUPELRI)  175 MCG/3ML nebulizer solution Inhale one vial in nebulizer once daily. Do not mix with other nebulized medications. 30 mL 11   Current Facility-Administered Medications on File Prior to Visit  Medication Dose Route Frequency Provider Last Rate Last Admin   influenza  inactive virus vaccine (FLUZONE/FLUARIX) injection 0.5 mL  0.5 mL Intramuscular Once Denita Lung, MD         Review of Systems As in subjective     Objective:   Physical Exam Due to coronavirus pandemic stay at home measures, patient visit was virtual and they were not examined in person.   There were no vitals taken for this visit.  Gen: nad Otherwise virtual  prevents any other pertinent exam today Hard of hearing     Assessment:     Encounter Diagnosis  Name Primary?   Dysuria Yes       Plan:     She has a palliative care nurse that collected a urinalysis from her today and brought the sample to our office around the same time of her appointment.  Positive for leukocytes.  We will send for culture  I will start her on Bactrim empirically.  Continue good water intake.  If new or worse symptoms in the coming days such as fever, vomiting, significant belly or back pain and much worse to go to the emergency department.  Otherwise await culture  Teruko was seen today for dysuria.  Diagnoses and all orders for this visit:  Dysuria -     Urine Culture  Other orders -     sulfamethoxazole-trimethoprim (BACTRIM DS) 800-160 MG tablet; Take 1 tablet by mouth 2 (two) times daily.    F/u pending culture

## 2023-02-14 NOTE — Progress Notes (Signed)
Urine culture positive.  See how she is doing on the Bactrim antibiotic

## 2023-02-15 ENCOUNTER — Ambulatory Visit: Payer: Medicare Other | Admitting: Podiatry

## 2023-02-15 LAB — URINE CULTURE

## 2023-02-15 NOTE — Progress Notes (Signed)
The urine culture was positive for infection, and the bacteria is sensitive to bactrim.   As long as you are improving, finish the antibiotic as it should work.  If not improving, let me know.

## 2023-02-18 ENCOUNTER — Other Ambulatory Visit: Payer: Medicare Other

## 2023-02-18 ENCOUNTER — Telehealth: Payer: Self-pay | Admitting: Family Medicine

## 2023-02-18 VITALS — BP 102/63 | HR 90 | Temp 97.8°F | Resp 24 | Wt 110.0 lb

## 2023-02-18 DIAGNOSIS — Z515 Encounter for palliative care: Secondary | ICD-10-CM

## 2023-02-18 NOTE — Telephone Encounter (Signed)
[name] called from [facility] and is asking will Dr.Lalonde manage her comfort care orders or should a physician there do this? Provided call back number [phone]

## 2023-02-19 NOTE — Progress Notes (Signed)
[place] [facility] Follow Up Encounter Note     PATIENT NAME: [name] DOB: Apr 28, 1932 MRN: [id number]  PRIMARY CARE PROVIDER: Denita Lung, MD  RESPONSIBLE PARTY:  Acct ID - Guarantor Home Phone Work Phone Relationship Acct Type  [id number] [name], [name]* [phone]  Self P/F     [address] [place], [place] [address]   RN/SW completed home visit. Daughter also present.    HISTORY OF PRESENT ILLNESS:  89 y.o. female with multiple medical problems including COPD, anxiety, chronic respiratory failure with hypoxia.     Cognitive: Alert and oriented x 3. Participating in conversation and answers questions appropriately.  Appetite: Still not eating much. Weight at last visit was 114.8. Today's weight is 110. Daughter reports that when pt was on antibiotic recently for UTI, that she completely stopped eating. So they stopped the antibiotic.   Mobility: Pt walked in to kitchen to meet with RN using a rollator walker and with daughter holding on her arm. She is walking very cautiously. RN and daughter assisted pt to step onto scales with using walker for weight.    Respiratory: Pt currently appears very short of breath. Frequent rests during sentences. No audible wheezes and sats are in mid 90s%. Pt using accessory muscles in neck and abdomen when breathing. Reports that she has increased her oxygen to 3Lpm via n/c at all times and uses her nebulizers three times a day and prn.   Palliative Care/ Hospice: RN discussed benefits of hospice care as well as the differences between hospice and palliative with patient. Pt reports that husband was at hospice facility during beginning of covid and that "they would not let me in to see him." RN validated feelings of anger and frustration and then was very transparent with pt regarding need for higher level of support that she could get from hospice and not palliative. Pt agreed to hospice av visit. RN to get referral from PCP.     PHYSICAL EXAM:   VITALS: Today's Vitals   02/18/23 1020  BP: 102/63  Pulse: 90  Resp: (!) 24  Temp: 97.8 F (36.6 C)  TempSrc: Temporal  SpO2: 94%  Weight: 110 lb (49.9 kg)    LUNGS: breath sounds diminished in all lobes bilaterally. Use of accessory muscles noted. RR 24 and shallow.  CARDIAC:  regular, no jvd EXTREMITIES: MAE x 4. Pt appears weak while walking and unsteady.   Jacqulyn Cane, RN

## 2023-02-25 ENCOUNTER — Other Ambulatory Visit: Payer: Medicare Other

## 2023-02-26 ENCOUNTER — Other Ambulatory Visit: Payer: Medicare Other

## 2023-02-26 VITALS — BP 118/60 | HR 76 | Temp 97.2°F | Wt 114.0 lb

## 2023-02-26 DIAGNOSIS — Z515 Encounter for palliative care: Secondary | ICD-10-CM

## 2023-02-26 NOTE — Progress Notes (Signed)
PATIENT NAME: [name] DOB: 1932/12/19 MRN: [id number]  PRIMARY CARE PROVIDER: Denita Lung, MD  RESPONSIBLE PARTY:  Acct ID - Guarantor Home Phone Work Phone Relationship Acct Type  [id number] REFUJIA, [id number] [phone]  Self P/F     [address], [place], [place] [address]     Palliative Care Follow Up Encounter Note   Completed home visit. [name] also present.     HISTORY OF PRESENT ILLNESS:    Respiratory: 3L O2; breathing is much improved from the last Palliative Care visit  Cognitive: pt is at baseline   Appetite: improved since last visit; eating 3 meals per day again  GI/GU: had UTI but stopped the ABT because she wasn't eating; has a BM daily; no urinary issues at this time   Mobility: ambulates at baseline; daughter reports she wasn't walking while she was ill  ADLs: some assist needed; will have aides in the home beginning mid-month   Sleeping Pattern: sleeps well and awakens to use BSC during the night  Pain: denies paint this time  Wt: 114 lbs (taken on pt's scale)    Goals of Care: To stay in the home with assist of aides for as long as possible  CODE STATUS: DNR ADVANCED DIRECTIVES: Y MOST FORM: Ys PPS: 50%   PHYSICAL EXAM:   VITALS: Today's Vitals   02/26/23 1135  BP: 118/60  Pulse: 76  Temp: (!) 97.2 F (36.2 C)  SpO2: 98%  Weight: 114 lb (51.7 kg)    LUNGS: clear to auscultation  CARDIAC: Cor RRR EXTREMITIES: normal SKIN: Skin color, texture, turgor normal. No rashes or lesions  NEURO: negative       Lyn Deemer Georgann Housekeeper, LPN

## 2023-02-27 ENCOUNTER — Other Ambulatory Visit: Payer: Self-pay | Admitting: Family Medicine

## 2023-02-27 ENCOUNTER — Ambulatory Visit: Payer: Medicare Other | Admitting: Podiatry

## 2023-03-20 ENCOUNTER — Telehealth: Payer: Self-pay | Admitting: Internal Medicine

## 2023-03-20 ENCOUNTER — Other Ambulatory Visit: Payer: Self-pay

## 2023-03-20 MED ORDER — PREDNISONE 10 MG PO TABS: 10.0000 mg | ORAL_TABLET | Freq: Every day | ORAL | 2 refills | Status: DC

## 2023-03-20 MED ORDER — PREDNISONE 10 MG PO TABS: 10.0000 mg | ORAL_TABLET | Freq: Every day | ORAL | 5 refills | Status: DC

## 2023-03-20 NOTE — Telephone Encounter (Signed)
Refill on pred sent to preferred pharm  Pt notified  Nothing further needed

## 2023-03-20 NOTE — Telephone Encounter (Signed)
Spoke with patient. Advised refill of prednisone has been sent to pharmacy. NFN

## 2023-03-20 NOTE — Telephone Encounter (Signed)
PT calling saying [facility] is waiting for Dr. Christie Beckers on her Pred.  Pls call to advise. States she takes one per day.  [facility] [address] on [address].  [phone]

## 2023-03-28 ENCOUNTER — Other Ambulatory Visit: Payer: Medicare Other

## 2023-03-28 VITALS — BP 122/70 | HR 74 | Temp 94.9°F

## 2023-03-28 DIAGNOSIS — Z515 Encounter for palliative care: Secondary | ICD-10-CM

## 2023-03-28 NOTE — Progress Notes (Signed)
PATIENT NAME: [name] DOB: 01-17-1932 MRN: [id number]  PRIMARY CARE PROVIDER: Ronnald Nian, MD  RESPONSIBLE PARTY:  Acct ID - Guarantor Home Phone Work Phone Relationship Acct Type  [id number] [name], [name] [phone]  Self P/F     [address], [place], [place] [address]  Palliative Care Follow Up Encounter Note    Completed home visit.         Respiratory: 3L O2; breathing is at baseline   Cognitive: pt is at baseline   Appetite: eating 3 reduced meals per day and snacks; drinks a small Pepsi and leaves 8oz bottles of water around the house   GI/GU: has a BM daily; no urinary issues at this time   Mobility: ambulates at baseline with a rolling walker   ADLs: some assist needed; has aides in the home and likes the help   Sleeping Pattern: sleeps well and awakens to use BSC during the night   Pain: denies pain this time   Wt: 114 lbs (taken on pt's scale); today's weight is 116 lbs       Goals of Care: To stay in the home with assist of aides for as long as possible   CODE STATUS: DNR ADVANCED DIRECTIVES: Y MOST FORM: Y PPS: 50%  PHYSICAL EXAM:   VITALS: Today's Vitals   03/28/23 1209  BP: 122/70  Pulse: 74  Temp: (!) 94.9 F (34.9 C)  TempSrc: Temporal  SpO2: 98%  PainSc: 0-No pain    LUNGS: clear to auscultation , decreased breath sounds CARDIAC: Cor RRR EXTREMITIES: no edema SKIN: cool, dry and intact NEURO: negative   Telephone call to daughter [name] to update on today's visit.    [name], LPN

## 2023-03-29 ENCOUNTER — Telehealth: Payer: Self-pay | Admitting: Internal Medicine

## 2023-03-29 NOTE — Telephone Encounter (Signed)
Pt has forms from [employer] and she needs them filled out and she said she will mail them [place] next week

## 2023-03-29 NOTE — Telephone Encounter (Signed)
Spoke with patient she needs papwerwork completed from [employer]. She is going to place in the mail next week. If we could please sign and fax paperwork.  nfn

## 2023-04-10 ENCOUNTER — Telehealth: Payer: Self-pay

## 2023-04-10 NOTE — Telephone Encounter (Signed)
1432 Palliative Care Note  RN called for PC check in. Pt states, she is "doing ok. Denies any changes since last visit. Pt open to frequent calls.   Barbette Merino, RN

## 2023-04-18 ENCOUNTER — Telehealth: Payer: Self-pay

## 2023-04-18 NOTE — Telephone Encounter (Signed)
1043 Palliative Care Note  RN called for palliative care check in. Pt states, "I am doing good this week." Denies having any questions, concerns, or complaints today. Continues to be open to weekly calls.  Barbette Merino, RN

## 2023-04-25 ENCOUNTER — Telehealth: Payer: Self-pay

## 2023-04-25 NOTE — Telephone Encounter (Signed)
1639 Palliative Care Note  RN called and spoke with pt.Verified using two identifiers. Pt states she has done well since my last call. No c/o, no questions no concerns. Continues to be open to frequent calls.  Barbette Merino, RN

## 2023-04-30 ENCOUNTER — Other Ambulatory Visit: Payer: Medicare Other

## 2023-05-02 ENCOUNTER — Other Ambulatory Visit: Payer: Medicare Other

## 2023-05-02 DIAGNOSIS — Z515 Encounter for palliative care: Secondary | ICD-10-CM

## 2023-05-02 NOTE — Progress Notes (Signed)
COMMUNITY PALLIATIVE CARE SW NOTE  PATIENT NAME: [name] DOB: June 23, 1932 MRN: [id number]  PRIMARY CARE PROVIDER: Ronnald Nian, MD  RESPONSIBLE PARTY:  Acct ID - Guarantor Home Phone Work Phone Relationship Acct Type  [id number] Debra, Booker* [phone]  Self P/F     [address], [place], [place] [address]     PLAN OF CARE and INTERVENTIONS:              Palliative care encounter: SW completed follow up home visit with patient. Patients private caregiver also present in other room.  Patient received sitting at kitchen table, shared that she is not too well this visit due to a recent loss in the family.  Functional status: patient has been stable since previous visit, with no medical concerns. No falls reported.  No provder visits since previous PC visit.  Grief/bereavement: Patient had recent death in family (grandson). Patient is withdrawn and mostly quiet this visit. Patient did not offer much information about her relationship with her grandson, other than they had a "really good relationship". Patient shared that the funeral arrangements have been made for tomorrow in [place] and she will be unable to travel due to medical condtions. Her daughter will make arrangements for patient to attend the funeral virtually. Patients friends  and family are providing emotional support. SW offered/provided emtional support during visit and patient declined any other greif counselng support at this time. Patient voiced appreciation of SW condolences and visit.   End stage COPD: patient continue on 3LPM. Patient has daily swelling/edema in bilateral legs, wears comprehension stockings.  Appetite: poor. patient state that she does not feel like eating much at this current time. This is due to s/s of grief. patient does drink ensure.   Resources: Patient continues to have private caregivers in place. Patient did voice some concern about transportation to appointments, due her family  that are local aging and not being able to drive as much and her current caregiver being elderly as well. SW advised patient that PC can assist with transportation resources if needed. Patient stated understanding and will discuss with her daughter at a later time. Patient has a f/u pulmonology appt in July, that she is unsure she will make or not.    Plan: PC to continue to follow. PC will f/u in 3-4 weeks.  SOCIAL HX:  Social History   Tobacco Use   Smoking status: Former    Packs/day: 0.30    Years: 50.00    Additional pack years: 0.00    Total pack years: 15.00    Types: Cigarettes    Quit date: 03/05/2013    Years since quitting: 10.1    Passive exposure: Past   Smokeless tobacco: Never  Substance Use Topics   Alcohol use: No    CODE STATUS: DNR ADVANCED DIRECTIVES: Y; [name] is HCPOA MOST FORM COMPLETE:  Y HOSPICE EDUCATION PROVIDED: N  PPS: 40%  Time spent: 30 min     [name], [place]

## 2023-05-14 ENCOUNTER — Ambulatory Visit (INDEPENDENT_AMBULATORY_CARE_PROVIDER_SITE_OTHER): Payer: Medicare Other | Admitting: Nurse Practitioner

## 2023-05-14 DIAGNOSIS — H6123 Impacted cerumen, bilateral: Secondary | ICD-10-CM

## 2023-05-14 NOTE — Progress Notes (Signed)
Ear lavage done on both ears for patient. Pt has an appt next week for fitting for new hearing aids  Bilateral ears clear following lavage.   I have reviewed this encounter including the documentation in this note and/or discussed this patient with the CMA. All orders and medical decision making have been approved by the supervising primary care provider.   I am certifying that I agree with the content of this note as supervising primary care provider.   Tollie Eth, DNP, AGNP-c

## 2023-06-03 ENCOUNTER — Ambulatory Visit (INDEPENDENT_AMBULATORY_CARE_PROVIDER_SITE_OTHER): Payer: Medicare Other | Admitting: Podiatry

## 2023-06-03 ENCOUNTER — Encounter: Payer: Self-pay | Admitting: Podiatry

## 2023-06-03 DIAGNOSIS — M79676 Pain in unspecified toe(s): Secondary | ICD-10-CM

## 2023-06-03 DIAGNOSIS — B351 Tinea unguium: Secondary | ICD-10-CM | POA: Diagnosis not present

## 2023-06-03 DIAGNOSIS — N289 Disorder of kidney and ureter, unspecified: Secondary | ICD-10-CM | POA: Diagnosis not present

## 2023-06-03 DIAGNOSIS — T148XXA Other injury of unspecified body region, initial encounter: Secondary | ICD-10-CM | POA: Insufficient documentation

## 2023-06-03 NOTE — Progress Notes (Signed)
This patient returns to my office for at risk foot care.  This patient requires this care by a professional since this patient will be at risk due to having  renal insufficiency.  This patient is unable to cut nails herself since the patient cannot reach her nails.These nails are painful walking and wearing shoes.  She presents to the office in a wheelchair with her daughter. This patient presents for at risk foot care today.    General Appearance  Alert, conversant and in no acute stress.  Vascular  Dorsalis pedis and posterior tibial  pulses are weakly  palpable  bilaterally.  Capillary return is within normal limits  Bilaterally. Cold feet. Bilaterally.  Absent digital hair  B/L.  Neurologic  Senn-Weinstein monofilament wire test diminished   bilaterally. Muscle power within normal limits bilaterally.  Nails Thick disfigured discolored nails with subungual debris  from hallux to fifth toes bilaterally.  Subungual blister right hallux nail.  Orthopedic  No limitations of motion  feet .  No crepitus or effusions noted.  No bony pathology or digital deformities noted.  Skin  normotropic skin with no porokeratosis noted bilaterally.  No signs of infections or ulcers noted.   Brown discoloration dorsum left foot is fading.  Onychomycosis  Pain in right toes  Pain in left toes  Consent was obtained for treatment procedures.   Mechanical debridement of nails 1-5  bilaterally performed with a nail nipper.  Filed with dremel without incident. DSD applied to right hallux.   Return office visit   10 weeks               Told patient to return for periodic foot care and evaluation due to potential at risk complications.   Helane Gunther DPM

## 2023-06-24 ENCOUNTER — Encounter: Payer: Self-pay | Admitting: Nurse Practitioner

## 2023-06-24 ENCOUNTER — Other Ambulatory Visit (INDEPENDENT_AMBULATORY_CARE_PROVIDER_SITE_OTHER): Payer: Medicare Other

## 2023-06-24 ENCOUNTER — Other Ambulatory Visit: Payer: Medicare Other

## 2023-06-24 ENCOUNTER — Telehealth (INDEPENDENT_AMBULATORY_CARE_PROVIDER_SITE_OTHER): Payer: Medicare Other | Admitting: Nurse Practitioner

## 2023-06-24 DIAGNOSIS — R3915 Urgency of urination: Secondary | ICD-10-CM | POA: Diagnosis not present

## 2023-06-24 DIAGNOSIS — R350 Frequency of micturition: Secondary | ICD-10-CM

## 2023-06-24 DIAGNOSIS — R300 Dysuria: Secondary | ICD-10-CM | POA: Diagnosis not present

## 2023-06-24 LAB — POCT URINALYSIS DIP (CLINITEK)
Glucose, UA: NEGATIVE mg/dL
Ketones, POC UA: NEGATIVE mg/dL
Nitrite, UA: NEGATIVE
POC PROTEIN,UA: 30 — AB
Spec Grav, UA: 1.01 (ref 1.010–1.025)
Urobilinogen, UA: 0.2 E.U./dL
pH, UA: 6 (ref 5.0–8.0)

## 2023-06-24 NOTE — Assessment & Plan Note (Signed)
UA in the office shows the presence of leukocytes and a small amount of blood. I would like to send this for culture to determine the best treatment needed. She does have a z-pack at home that she can use, if needed. We discussed that this is not typically an ideal choice for urinary infections.  Plan: - urine culture to determine antibiotic need

## 2023-06-24 NOTE — Progress Notes (Signed)
Virtual Visit Encounter mychart visit.   I connected with  [name] on 06/24/23 at  4:30 PM EDT by secure video and audio telemedicine application. I verified that I am speaking with the correct person using two identifiers.   I introduced myself as a Publishing rights manager with the practice. The limitations of evaluation and management by telemedicine discussed with the patient and the availability of in person appointments. The patient expressed verbal understanding and consent to proceed.  Participating parties in this visit include: Myself and patient  The patient is: Patient Location: Home I am: Provider Location: Office/Clinic Subjective:    CC and HPI: [name] is a 89 y.o. year old female presenting for new evaluation and treatment of dysuria. Patient reports the following: She reports increased urinary urgency and frequency for the last day or two. She has not had any fevers or chills, back pain, nausea, or pelvic pain. Mild dysuria present.   She dropped a urine sample off to the office earlier today for testing.   Past medical history, Surgical history, Family history not pertinant except as noted below, Social history, Allergies, and medications have been entered into the medical record, reviewed, and corrections made.   Review of Systems:  All review of systems negative except what is listed in the HPI  Objective:    Alert and oriented x 4 Speaking in clear sentences with no shortness of breath. No distress.  Impression and Recommendations:    Problem List Items Addressed This Visit     Dysuria - Primary    UA in the office shows the presence of leukocytes and a small amount of blood. I would like to send this for culture to determine the best treatment needed. She does have a z-pack at home that she can use, if needed. We discussed that this is not typically an ideal choice for urinary infections.  Plan: - urine culture to determine antibiotic need        orders and follow up as documented in EMR I discussed the assessment and treatment plan with the patient. The patient was provided an opportunity to ask questions and all were answered. The patient agreed with the plan and demonstrated an understanding of the instructions.   The patient was advised to call back or seek an in-person evaluation if the symptoms worsen or if the condition fails to improve as anticipated.  Follow-Up: after labs and/or imaging, consults, etc. have been completed  I provided 12 minutes of non-face-to-face interaction with this non face-to-face encounter including intake, same-day documentation, and chart review.   Tollie Eth, NP , DNP, AGNP-c [facility]

## 2023-06-26 ENCOUNTER — Other Ambulatory Visit: Payer: Self-pay | Admitting: Nurse Practitioner

## 2023-06-26 DIAGNOSIS — N3090 Cystitis, unspecified without hematuria: Secondary | ICD-10-CM

## 2023-06-26 MED ORDER — CIPROFLOXACIN HCL 500 MG PO TABS: 500.0000 mg | ORAL_TABLET | Freq: Two times a day (BID) | ORAL | 0 refills | Status: AC

## 2023-06-27 LAB — URINE CULTURE

## 2023-07-01 ENCOUNTER — Encounter: Payer: Self-pay | Admitting: Nurse Practitioner

## 2023-07-04 ENCOUNTER — Ambulatory Visit: Payer: Medicare Other | Admitting: Internal Medicine

## 2023-07-04 ENCOUNTER — Telehealth: Payer: Self-pay

## 2023-07-04 ENCOUNTER — Other Ambulatory Visit: Payer: Self-pay | Admitting: Nurse Practitioner

## 2023-07-04 DIAGNOSIS — B9689 Other specified bacterial agents as the cause of diseases classified elsewhere: Secondary | ICD-10-CM

## 2023-07-04 MED ORDER — CIPROFLOXACIN HCL 500 MG PO TABS
500.0000 mg | ORAL_TABLET | Freq: Two times a day (BID) | ORAL | 0 refills | Status: AC
Start: 2023-07-04 — End: 2023-07-09

## 2023-07-04 NOTE — Telephone Encounter (Signed)
Pt. Daughter called back stating she was confused about round of another Cipro for her mom. She said she had already finished the first round of Cipro and was not having any more urinary symptoms. She said that had sent a message about rechecking the urine and since she was not having any symptoms we told her that we did not need to recheck her urine. So she just wanted to clarify that you wanted her to take a second round of Cipro or not before they pick it up.

## 2023-07-04 NOTE — Telephone Encounter (Signed)
Pt. Daughter aware she does not need to do the second round of Cipro because she is longer having symptoms.

## 2023-07-11 ENCOUNTER — Other Ambulatory Visit: Payer: Self-pay | Admitting: Family Medicine

## 2023-07-16 ENCOUNTER — Ambulatory Visit (INDEPENDENT_AMBULATORY_CARE_PROVIDER_SITE_OTHER): Payer: Medicare Other

## 2023-07-16 VITALS — Ht 67.0 in | Wt 120.0 lb

## 2023-07-16 DIAGNOSIS — Z0000 Encounter for general adult medical examination without abnormal findings: Secondary | ICD-10-CM

## 2023-07-16 NOTE — Patient Instructions (Signed)
[name] , Thank you for taking time to come for your Medicare Wellness Visit. I appreciate your ongoing commitment to your health goals. Please review the following plan we discussed and let me know if I can assist you in the future.   These are the goals we discussed:  Goals      Patient Stated     07/16/2023, denies goals at this time        This is a list of the screening recommended for you and due dates:  Health Maintenance  Topic Date Due   Zoster (Shingles) Vaccine (1 of 2) 09/14/1984   COVID-19 Vaccine (7 - 2023-24 season) 02/17/2023   Flu Shot  07/25/2023   DTaP/Tdap/Td vaccine (4 - Td or Tdap) 06/04/2029   Pneumonia Vaccine  Completed   DEXA scan (bone density measurement)  Completed   HPV Vaccine  Aged Out    Advanced directives: copy in chart  Conditions/risks identified: none  Next appointment: Follow up in one year for your annual wellness visit    Preventive Care 65 Years and Older, Female Preventive care refers to lifestyle choices and visits with your health care provider that can promote health and wellness. What does preventive care include? A yearly physical exam. This is also called an annual well check. Dental exams once or twice a year. Routine eye exams. Ask your health care provider how often you should have your eyes checked. Personal lifestyle choices, including: Daily care of your teeth and gums. Regular physical activity. Eating a healthy diet. Avoiding tobacco and drug use. Limiting alcohol use. Practicing safe sex. Taking low-dose aspirin every day. Taking vitamin and mineral supplements as recommended by your health care provider. What happens during an annual well check? The services and screenings done by your health care provider during your annual well check will depend on your age, overall health, lifestyle risk factors, and family history of disease. Counseling  Your health care provider may ask you questions about  your: Alcohol use. Tobacco use. Drug use. Emotional well-being. Home and relationship well-being. Sexual activity. Eating habits. History of falls. Memory and ability to understand (cognition). Work and work Astronomer. Reproductive health. Screening  You may have the following tests or measurements: Height, weight, and BMI. Blood pressure. Lipid and cholesterol levels. These may be checked every 5 years, or more frequently if you are over 58 years old. Skin check. Lung cancer screening. You may have this screening every year starting at age 23 if you have a 30-pack-year history of smoking and currently smoke or have quit within the past 15 years. Fecal occult blood test (FOBT) of the stool. You may have this test every year starting at age 41. Flexible sigmoidoscopy or colonoscopy. You may have a sigmoidoscopy every 5 years or a colonoscopy every 10 years starting at age 55. Hepatitis C blood test. Hepatitis B blood test. Sexually transmitted disease (STD) testing. Diabetes screening. This is done by checking your blood sugar (glucose) after you have not eaten for a while (fasting). You may have this done every 1-3 years. Bone density scan. This is done to screen for osteoporosis. You may have this done starting at age 71. Mammogram. This may be done every 1-2 years. Talk to your health care provider about how often you should have regular mammograms. Talk with your health care provider about your test results, treatment options, and if necessary, the need for more tests. Vaccines  Your health care provider may recommend certain vaccines, such  as: Influenza vaccine. This is recommended every year. Tetanus, diphtheria, and acellular pertussis (Tdap, Td) vaccine. You may need a Td booster every 10 years. Zoster vaccine. You may need this after age 52. Pneumococcal 13-valent conjugate (PCV13) vaccine. One dose is recommended after age 50. Pneumococcal polysaccharide (PPSV23) vaccine.  One dose is recommended after age 46. Talk to your health care provider about which screenings and vaccines you need and how often you need them. This information is not intended to replace advice given to you by your health care provider. Make sure you discuss any questions you have with your health care provider. Document Released: 01/06/2016 Document Revised: 08/29/2016 Document Reviewed: 10/11/2015 Elsevier Interactive Patient Education  2017 [employer].  Fall Prevention in the Home Falls can cause injuries. They can happen to people of all ages. There are many things you can do to make your home safe and to help prevent falls. What can I do on the outside of my home? Regularly fix the edges of walkways and driveways and fix any cracks. Remove anything that might make you trip as you walk through a door, such as a raised step or threshold. Trim any bushes or trees on the path to your home. Use bright outdoor lighting. Clear any walking paths of anything that might make someone trip, such as rocks or tools. Regularly check to see if handrails are loose or broken. Make sure that both sides of any steps have handrails. Any raised decks and porches should have guardrails on the edges. Have any leaves, snow, or ice cleared regularly. Use sand or salt on walking paths during winter. Clean up any spills in your garage right away. This includes oil or grease spills. What can I do in the bathroom? Use night lights. Install grab bars by the toilet and in the tub and shower. Do not use towel bars as grab bars. Use non-skid mats or decals in the tub or shower. If you need to sit down in the shower, use a plastic, non-slip stool. Keep the floor dry. Clean up any water that spills on the floor as soon as it happens. Remove soap buildup in the tub or shower regularly. Attach bath mats securely with double-sided non-slip rug tape. Do not have throw rugs and other things on the floor that can make  you trip. What can I do in the bedroom? Use night lights. Make sure that you have a light by your bed that is easy to reach. Do not use any sheets or blankets that are too big for your bed. They should not hang down onto the floor. Have a firm chair that has side arms. You can use this for support while you get dressed. Do not have throw rugs and other things on the floor that can make you trip. What can I do in the kitchen? Clean up any spills right away. Avoid walking on wet floors. Keep items that you use a lot in easy-to-reach places. If you need to reach something above you, use a strong step stool that has a grab bar. Keep electrical cords out of the way. Do not use floor polish or wax that makes floors slippery. If you must use wax, use non-skid floor wax. Do not have throw rugs and other things on the floor that can make you trip. What can I do with my stairs? Do not leave any items on the stairs. Make sure that there are handrails on both sides of the stairs and use  them. Fix handrails that are broken or loose. Make sure that handrails are as long as the stairways. Check any carpeting to make sure that it is firmly attached to the stairs. Fix any carpet that is loose or worn. Avoid having throw rugs at the top or bottom of the stairs. If you do have throw rugs, attach them to the floor with carpet tape. Make sure that you have a light switch at the top of the stairs and the bottom of the stairs. If you do not have them, ask someone to add them for you. What else can I do to help prevent falls? Wear shoes that: Do not have high heels. Have rubber bottoms. Are comfortable and fit you well. Are closed at the toe. Do not wear sandals. If you use a stepladder: Make sure that it is fully opened. Do not climb a closed stepladder. Make sure that both sides of the stepladder are locked into place. Ask someone to hold it for you, if possible. Clearly mark and make sure that you can  see: Any grab bars or handrails. First and last steps. Where the edge of each step is. Use tools that help you move around (mobility aids) if they are needed. These include: Canes. Walkers. Scooters. Crutches. Turn on the lights when you go into a dark area. Replace any light bulbs as soon as they burn out. Set up your furniture so you have a clear path. Avoid moving your furniture around. If any of your floors are uneven, fix them. If there are any pets around you, be aware of where they are. Review your medicines with your doctor. Some medicines can make you feel dizzy. This can increase your chance of falling. Ask your doctor what other things that you can do to help prevent falls. This information is not intended to replace advice given to you by your health care provider. Make sure you discuss any questions you have with your health care provider. Document Released: 10/06/2009 Document Revised: 05/17/2016 Document Reviewed: 01/14/2015 Elsevier Interactive Patient Education  2017 [employer].

## 2023-07-16 NOTE — Progress Notes (Signed)
Subjective:   [name] is a 89 y.o. female who presents for an Initial Medicare Annual Wellness Visit.  Visit Complete: Virtual  I connected with  [name] on 07/16/23 by a audio enabled telemedicine application and verified that I am speaking with the correct person using two identifiers.  Patient Location: Home  Provider Location: Office/Clinic  I discussed the limitations of evaluation and management by telemedicine. The patient expressed understanding and agreed to proceed.  Per patient no change in vitals since last visit; unable to obtain new vitals due to this being a telehealth visit.  Patient was unable to self-report vital signs via telehealth due to a lack of equipment at home.  Review of Systems     Cardiac Risk Factors include: advanced age (>26men, >67 women)     Objective:    Today's Vitals   07/16/23 0941  Weight: 120 lb (54.4 kg)  Height: 5\' 7"  (1.702 m)   Body mass index is 18.79 kg/m.     07/16/2023    9:49 AM 08/20/2020   10:00 PM 05/05/2013    4:03 PM 02/28/2013    5:00 PM  Advanced Directives  Does Patient Have a Medical Advance Directive? Yes Yes Patient has advance directive, copy not in chart Patient has advance directive, copy not in chart  Type of Advance Directive Healthcare Power of [place];Living will Healthcare Power of [employer] of [place];Living will Living will;Healthcare Power of Attorney  Does patient want to make changes to medical advance directive?  No - Patient declined    Copy of Healthcare Power of Attorney in Chart? Yes - validated most recent copy scanned in chart (See row information)  Copy requested from family Copy requested from family  Pre-existing out of facility DNR order (yellow form or pink MOST form)    No    Current Medications (verified) Outpatient Encounter Medications as of 07/16/2023  Medication Sig   acetaminophen (TYLENOL) 650 MG CR tablet Take 650 mg by mouth every 8  (eight) hours as needed for pain.   albuterol (PROVENTIL) (2.5 MG/3ML) 0.083% nebulizer solution USE 1 VIAL VIA NEBULIZER EVERY 6 HOURS AS NEEDED FOR WHEEZING OR SHORTNESS OF BREATH DX: J44.9   albuterol (VENTOLIN HFA) 108 (90 Base) MCG/ACT inhaler INHALE 2 PUFFS BY MOUTH EVERY 4 HOURS AS NEEDED FOR WHEEZING OR SHORTNESS OF BREATH   cetirizine (ZYRTEC) 10 MG tablet Take 10 mg by mouth daily.   Cholecalciferol (VITAMIN D) 50 MCG (2000 UT) CAPS Take 1 capsule by mouth daily.   dextromethorphan-guaiFENesin (MUCINEX DM) 30-600 MG per 12 hr tablet Take 1 tablet by mouth at bedtime as needed (with flutter for cough, congestion, and thick mucus).    formoterol (PERFOROMIST) 20 MCG/2ML nebulizer solution Substituted for: Perforomist Solution Inhale one vial in nebulizer twice a day.   omeprazole (PRILOSEC) 20 MG capsule Take 20 mg by mouth daily.   OXYGEN Use 2L of O2 continuously   predniSONE (DELTASONE) 10 MG tablet Take 1 tablet (10 mg total) by mouth daily with breakfast.   revefenacin (YUPELRI) 175 MCG/3ML nebulizer solution Inhale one vial in nebulizer once daily. Do not mix with other nebulized medications.   brimonidine (ALPHAGAN) 0.2 % ophthalmic solution Place 1 drop into the right eye 3 (three) times daily. (Patient not taking: Reported on 07/16/2023)   Facility-Administered Encounter Medications as of 07/16/2023  Medication   influenza  inactive virus vaccine (FLUZONE/FLUARIX) injection 0.5 mL    Allergies (verified) Bactrim ds [sulfamethoxazole-trimethoprim]  History: Past Medical History:  Diagnosis Date   Asthma    COPD (chronic obstructive pulmonary disease) (HCC) 01/2010   Dr. Sherene Sires;   [facility])    Former smoker    quit 02/2013   H/O bone density study 2008   osteoporosis, improved density on Fosamax at that time   Hypoxia 01/2013   hospitalization, Bipap therapy, COPD exacerbation   Osteoporosis    Past Surgical History:  Procedure Laterality Date    CHOLECYSTECTOMY  1970's   COLONOSCOPY  01/22/07   severe diverticulosis, external hemorrhoids, Dr. Loreta Ave; advised repeat 2013   Family History  Problem Relation Age of Onset   Diabetes Mother    Colon cancer Mother    Arthritis Father    Stroke Father    Social History   Socioeconomic History   Marital status: Widowed    Spouse name: Not on file   Number of children: 1   Years of education: Not on file   Highest education level: Not on file  Occupational History   Occupation: Retired    Comment: Teacher, music   Tobacco Use   Smoking status: Former    Current packs/day: 0.00    Average packs/day: 0.3 packs/day for 50.0 years (15.0 ttl pk-yrs)    Types: Cigarettes    Start date: 03/06/1963    Quit date: 03/05/2013    Years since quitting: 10.3    Passive exposure: Past   Smokeless tobacco: Never  Vaping Use   Vaping status: Never Used  Substance and Sexual Activity   Alcohol use: No   Drug use: No   Sexual activity: Not on file  Other Topics Concern   Not on file  Social History Narrative   Not on file   Social Determinants of Health   Financial Resource Strain: Low Risk  (07/16/2023)   Overall Financial Resource Strain (CARDIA)    Difficulty of Paying Living Expenses: Not hard at all  Food Insecurity: No Food Insecurity (07/16/2023)   Hunger Vital Sign    Worried About Running Out of Food in the Last Year: Never true    Ran Out of Food in the Last Year: Never true  Transportation Needs: No Transportation Needs (07/16/2023)   PRAPARE - Administrator, Civil Service (Medical): No    Lack of Transportation (Non-Medical): No  Physical Activity: Inactive (07/16/2023)   Exercise Vital Sign    Days of Exercise per Week: 0 days    Minutes of Exercise per Session: 0 min  Stress: No Stress Concern Present (07/16/2023)   [employer] of Occupational Health - Occupational Stress Questionnaire    Feeling of Stress : Not at all  Social Connections: Socially  Isolated (07/16/2023)   Social Connection and Isolation Panel [NHANES]    Frequency of Communication with Friends and Family: More than three times a week    Frequency of Social Gatherings with Friends and Family: More than three times a week    Attends Religious Services: Never    Database administrator or Organizations: No    Attends Banker Meetings: Never    Marital Status: Widowed    Tobacco Counseling Counseling given: Not Answered   Clinical Intake:  Pre-visit preparation completed: Yes  Pain : No/denies pain     Nutritional Status: BMI of 19-24  Normal Nutritional Risks: None Diabetes: No  How often do you need to have someone help you when you read instructions, pamphlets, or other written materials  from your doctor or pharmacy?: 1 - Never  Interpreter Needed?: No  Information entered by :: NAllen LPN   Activities of Daily Living    07/16/2023    9:43 AM  In your present state of health, do you have any difficulty performing the following activities:  Hearing? 1  Comment has hearing aids  Vision? 0  Difficulty concentrating or making decisions? 0  Walking or climbing stairs? 1  Dressing or bathing? 0  Doing errands, shopping? 1  Comment has someone with her  Preparing Food and eating ? N  Using the Toilet? N  In the past six months, have you accidently leaked urine? N  Do you have problems with loss of bowel control? N  Managing your Medications? N  Managing your Finances? N  Housekeeping or managing your Housekeeping? N    Patient Care Team: Ronnald Nian, MD as PCP - General (Family Medicine) Tysinger, Kermit Balo, PA-C as Physician Assistant  Indicate any recent Medical Services you may have received from other than [facility] providers in the past year (date may be approximate).     Assessment:   This is a routine wellness examination for [place].  Hearing/Vision screen Hearing Screening - Comments:: Has hearing aids that are  maintained Vision Screening - Comments:: Regular eye exams  Dietary issues and exercise activities discussed:     Goals Addressed             This Visit's Progress    Patient Stated       07/16/2023, denies goals at this time       Depression Screen    07/16/2023    9:50 AM 05/22/2022    3:02 PM 10/13/2020    2:06 PM 04/26/2014   10:15 AM  PHQ 2/9 Scores  PHQ - 2 Score 0 0 0 0  PHQ- 9 Score 0       Fall Risk    07/16/2023    9:49 AM 09/18/2021    1:34 PM 11/16/2019   11:34 AM 11/13/2018    2:49 PM 07/24/2018    2:37 PM  Fall Risk   Falls in the past year? 0 0 0 0 No  Comment   Emmi Telephone Survey: data to providers prior to load [employer] Survey: data to providers prior to load [employer] Survey: data to providers prior to load  Number falls in past yr: 0 0     Injury with Fall? 0 0     Risk for fall due to : Medication side effect;Impaired mobility;Impaired balance/gait      Follow up Falls prevention discussed;Falls evaluation completed Falls evaluation completed       MEDICARE RISK AT HOME:  Medicare Risk at Home - 07/16/23 0949     Any stairs in or around the home? Yes    If so, are there any without handrails? No    Home free of loose throw rugs in walkways, pet beds, electrical cords, etc? Yes    Adequate lighting in your home to reduce risk of falls? Yes    Life alert? No    Use of a cane, walker or w/c? Yes    Grab bars in the bathroom? Yes    Shower chair or bench in shower? Yes    Elevated toilet seat or a handicapped toilet? Yes             TIMED UP AND GO:  Was the test performed? No    Cognitive Function:  07/16/2023    9:51 AM  6CIT Screen  What Year? 0 points  What month? 0 points  What time? 0 points  Count back from 20 0 points  Months in reverse 0 points  Repeat phrase 0 points  Total Score 0 points    Immunizations Immunization History  Administered Date(s) Administered   COVID-19, mRNA,  vaccine(Comirnaty)12 years and older 10/17/2022   DTaP 12/06/1992, 04/27/2004   Fluad Quad(high Dose 65+) 09/01/2020, 09/18/2021, 09/12/2022   Influenza Split 10/07/2012, 09/11/2013   Influenza Whole 10/08/2001, 10/02/2002, 09/19/2011, 09/23/2016   Influenza, High Dose Seasonal PF 09/22/2014, 09/30/2017, 09/17/2018, 09/17/2019   Influenza,inj,Quad PF,6+ Mos 09/20/2015   PFIZER(Purple Top)SARS-COV-2 Vaccination 01/02/2020, 02/02/2020, 09/01/2020, 06/20/2021   Pfizer Covid-19 Vaccine Bivalent Booster 32yrs & up 09/18/2021   Pneumococcal Conjugate-13 05/27/2014   Pneumococcal Polysaccharide-23 04/07/2013   Tdap 06/05/2019   Zoster, Live 12/03/2008    TDAP status: Up to date  Flu Vaccine status: Up to date  Pneumococcal vaccine status: Up to date  Covid-19 vaccine status: Completed vaccines  Qualifies for Shingles Vaccine? Yes   Zostavax completed Yes   Shingrix Completed?: No.    Education has been provided regarding the importance of this vaccine. Patient has been advised to call insurance company to determine out of pocket expense if they have not yet received this vaccine. Advised may also receive vaccine at local pharmacy or Health Dept. Verbalized acceptance and understanding.  Screening Tests Health Maintenance  Topic Date Due   Zoster Vaccines- Shingrix (1 of 2) 09/14/1984   COVID-19 Vaccine (7 - 2023-24 season) 02/17/2023   INFLUENZA VACCINE  07/25/2023   DTaP/Tdap/Td (4 - Td or Tdap) 06/04/2029   Pneumonia Vaccine 41+ Years old  Completed   DEXA SCAN  Completed   HPV VACCINES  Aged Out    Health Maintenance  Health Maintenance Due  Topic Date Due   Zoster Vaccines- Shingrix (1 of 2) 09/14/1984   COVID-19 Vaccine (7 - 2023-24 season) 02/17/2023    Colorectal cancer screening: No longer required.   Mammogram status: No longer required due to age.  Bone Density status: Completed 12/13/2007  Lung Cancer Screening: (Low Dose CT Chest recommended if Age 23-80  years, 20 pack-year currently smoking OR have quit w/in 15years.) does not qualify.   Lung Cancer Screening Referral: no  Additional Screening:  Hepatitis C Screening: does not qualify;   Vision Screening: Recommended annual ophthalmology exams for early detection of glaucoma and other disorders of the eye. Is the patient up to date with their annual eye exam?  Yes  Who is the provider or what is the name of the office in which the patient attends annual eye exams? Can't remember name If pt is not established with a provider, would they like to be referred to a provider to establish care? No .   Dental Screening: Recommended annual dental exams for proper oral hygiene  Diabetic Foot Exam: n/a  Community Resource Referral / Chronic Care Management: CRR required this visit?  No   CCM required this visit?  No     Plan:     I have personally reviewed and noted the following in the patient's chart:   Medical and social history Use of alcohol, tobacco or illicit drugs  Current medications and supplements including opioid prescriptions. Patient is not currently taking opioid prescriptions. Functional ability and status Nutritional status Physical activity Advanced directives List of other physicians Hospitalizations, surgeries, and ER visits in previous 12 months Vitals Screenings to  include cognitive, depression, and falls Referrals and appointments  In addition, I have reviewed and discussed with patient certain preventive protocols, quality metrics, and best practice recommendations. A written personalized care plan for preventive services as well as general preventive health recommendations were provided to patient.     Barb Merino, LPN   7/84/6962   After Visit Summary: (Pick Up) Due to this being a telephonic visit, with patients personalized plan was offered to patient and patient has requested to Pick up at office.  Nurse Notes: none

## 2023-08-06 ENCOUNTER — Ambulatory Visit: Payer: Medicare Other | Admitting: Internal Medicine

## 2023-08-06 ENCOUNTER — Encounter: Payer: Self-pay | Admitting: Internal Medicine

## 2023-08-06 VITALS — BP 116/64 | HR 88 | Ht 67.0 in | Wt 120.0 lb

## 2023-08-06 DIAGNOSIS — J449 Chronic obstructive pulmonary disease, unspecified: Secondary | ICD-10-CM

## 2023-08-06 DIAGNOSIS — J9612 Chronic respiratory failure with hypercapnia: Secondary | ICD-10-CM | POA: Diagnosis not present

## 2023-08-06 DIAGNOSIS — J9611 Chronic respiratory failure with hypoxia: Secondary | ICD-10-CM | POA: Diagnosis not present

## 2023-08-06 NOTE — Patient Instructions (Signed)
Make sure you check your oxygen saturation  AT  your highest level of activity (not after you stop)   to be sure it stays over 90% and adjust  02 flow upward to maintain this level if needed but remember to turn it back to previous settings when you stop (to conserve your supply).  ° ° °Please schedule a follow up visit in 12 months but call sooner if needed  °

## 2023-08-06 NOTE — Assessment & Plan Note (Signed)
Onset 02 rx 2013 initially just at hs   - ono RA 05/14/12   4 h 33 min < 89%   So ordered 02 2lpm    - HCO3  32 05/10/13    - 01/05/2013   Walked RA x one lap @ 185 stopped due to  88%   - 01/05/2013  Walked 2lpm 2 laps @ 185 ft each stopped due to  90% sob but improved vs baseline   - RA 05/01/2013 sats = 87%  - 05/10/2017 > 02 sats 88% RA > referred for POC eval > preferred continuous flow - 05/14/2019   Walked RA  1 laps @  approx 159ft each @ slow pace  stopped due to sob and desats to 84% corrected on 2lpm and completed lap s desats -  HCO3    07/02/2022   = 35   Though somewhat paradoxic, when the lung fails to clear C02 properly and pC02 rises the lung then becomes a more efficient scavenger of C02 allowing lower work of breathing and  better C02 clearance albeit at a higher serum pC02 level - this is why pts can look a lot better than their ABG's would suggest and why it's so difficult to prognosticate endstage dz.  It's also why I strongly rec DNI status (ventilating pts down to a nl pC02 adversely affects this compensatory mechanism)   >>> well compensated on 3lpm POC but needs to turn up for transfers in and out of w/c  F/u yearly, sooner prn          Each maintenance medication was reviewed in detail including emphasizing most importantly the difference between maintenance and prns and under what circumstances the prns are to be triggered using an action plan format where appropriate.  Total time for H and P, chart review, counseling, reviewing neb/02/pulse ox  device(s) and generating customized AVS unique to this office visit / same day charting = 20 min

## 2023-08-06 NOTE — Progress Notes (Signed)
Subjective:   Patient ID: [name]     DOB: 04-19-1932     MRN: [id number]  Brief patient profile:  89 yowf quit smoking 02/2013  referred 02/08/2012 by [name] for intermittent sob was on 02 but stopped in early 2013 and with GOLD III COPD documented 03/2012     History of Present Illness  11/09/2016  f/u ov/ re:   GOLD III / maint perforomist/ bud/Incruse/ prn saba neb  Chief Complaint  Patient presents with   Follow-up    Pt called on 11/05/16 with c/o cough with yellow sputum- zpack and pred taper given. She is much improved and barely coughing at all at this point. Breathing is at her normal baseline.    baseline doe = MMRC3 = can't walk 100 yards even at a slow pace at a flat grade s stopping due to sob  Even on 02  rec For flares of cough/ wheeze/ short of breath assoc with change in mucus as you have in the past  >>> zpak / Prednisone 10 mg take  4 each am x 2 days,   2 each am x 2 days,  1 each am x 2 days and stop      07/02/2022  f/u ov/ re: GOLD 3 copd  maint on perforomist / yupelri  a zpak prn avg qom Prednisone 10 mg  Chief Complaint  Patient presents with   Follow-up    Pt states she has not been doing too good. Pt states she has no energy and now using a walker.   Dyspnea:  room to room with rollator on palliative care  Cough: min mucoid in am / slt smoker's rattle last used zpak on month prior to OV  with greens mucus which cleared  Sleeping: on a wedge/ one pillow  SABA use: none  02: 3lpm 24/7  Rec No change rx    08/06/2023  f/u ov/ re: GOLD 3/ hypercarbic and hypoxemic RF   maint on performint/yupelri and prednisone 10 mg daily   Chief Complaint  Patient presents with   Follow-up   Dyspnea:  room on room on 3lpm continuous/ pulsing when out and not titrating Cough: minimal mucoid Sleeping: wedge  s resp cc  SABA use: none  02: 3lpm 24/7     No obvious day to day or daytime variability or assoc excess/ purulent sputum or mucus  plugs or hemoptysis or cp or chest tightness, subjective wheeze or overt sinus or hb symptoms.     Also denies any obvious fluctuation of symptoms with weather or environmental changes or other aggravating or alleviating factors except as outlined above   No unusual exposure hx or h/o childhood pna/ asthma or knowledge of premature birth.  Current Allergies, Complete Past Medical History, Past Surgical History, Family History, and Social History were reviewed in [employer] record.  ROS  The following are not active complaints unless bolded Hoarseness, sore throat, dysphagia, dental problems, itching, sneezing,  nasal congestion or discharge of excess mucus or purulent secretions, ear ache,   fever, chills, sweats, unintended wt loss or wt gain, classically pleuritic or exertional cp,  orthopnea pnd or arm/hand swelling  or leg swelling, presyncope, palpitations, abdominal pain, anorexia, nausea, vomiting, diarrhea  or change in bowel habits or change in bladder habits, change in stools or change in urine, dysuria, hematuria,  rash, arthralgias, visual complaints, headache, numbness, weakness or ataxia or problems with walking or coordination,  change in mood  or  memory.        Current Meds  Medication Sig   acetaminophen (TYLENOL) 650 MG CR tablet Take 650 mg by mouth every 8 (eight) hours as needed for pain.   albuterol (PROVENTIL) (2.5 MG/3ML) 0.083% nebulizer solution USE 1 VIAL VIA NEBULIZER EVERY 6 HOURS AS NEEDED FOR WHEEZING OR SHORTNESS OF BREATH DX: J44.9   albuterol (VENTOLIN HFA) 108 (90 Base) MCG/ACT inhaler INHALE 2 PUFFS BY MOUTH EVERY 4 HOURS AS NEEDED FOR WHEEZING OR SHORTNESS OF BREATH   brimonidine (ALPHAGAN) 0.2 % ophthalmic solution Place 1 drop into the right eye 3 (three) times daily.   cetirizine (ZYRTEC) 10 MG tablet Take 10 mg by mouth daily.   Cholecalciferol (VITAMIN D) 50 MCG (2000 UT) CAPS Take 1 capsule by mouth daily.    dextromethorphan-guaiFENesin (MUCINEX DM) 30-600 MG per 12 hr tablet Take 1 tablet by mouth at bedtime as needed (with flutter for cough, congestion, and thick mucus).    formoterol (PERFOROMIST) 20 MCG/2ML nebulizer solution Substituted for: Perforomist Solution Inhale one vial in nebulizer twice a day.   omeprazole (PRILOSEC) 20 MG capsule Take 20 mg by mouth daily.   OXYGEN Use 2L of O2 continuously   predniSONE (DELTASONE) 10 MG tablet Take 1 tablet (10 mg total) by mouth daily with breakfast.   revefenacin (YUPELRI) 175 MCG/3ML nebulizer solution Inhale one vial in nebulizer once daily. Do not mix with other nebulized medications.                          Objective:   Physical Exam  Wts  08/06/2023    120  07/02/2022   132  03/21/2021    135  02/21/2021      131 02/07/2021    133 08/09/2020    135  08/10/2019    137  05/14/2019  140  Wt 97 02/08/2012 > 03/24/2012  99 > 103 05/09/2012 > 06/12/2012  99 >   09/24/2012  96 > 98 01/05/2013 >  106 05/01/2013 > 05/05/2013  99 > 05/15/2013  104 > 06/01/2013 115 > 115 07/07/2013 > 114  07/17/13 > 127 09/18/2013 > 12/01/2013  131 >135 03/04/2014 > 03/12/2014  133 > 05/27/2014  133  > 06/28/2014  133 >131 07/26/2014 >133 09/21/2014 139 >  04/14/2015  138  >  05/08/2016  146 > 05/10/2017    145 >  11/12/2017  134 >  11/03/2018   144 >   Vital signs reviewed  08/06/2023  - Note at rest 02 sats  82% on 3lpm POC p transfer to w/c   General appearance:    frail but quite pleasant well compensated s wob/ w/c bound    HEENT :  Oropharynx  clear      NECK :  without JVD/Nodes/TM/ nl carotid upstrokes bilaterally   LUNGS: no acc muscle use,  Mod barrel  contour chest wall with bilateral  Distant bs s audible wheeze and  without cough on insp or exp maneuvers and mod  Hyperresonant  to  percussion bilaterally     CV:  RRR  no s3 or murmur or increase in P2, and  elastic hose both LEs  ABD:  soft and nontender with pos mid insp Hoover's  in the supine position. No bruits or  organomegaly appreciated, bowel sounds nl  MS:   Ext warm without deformities or   obvious joint restrictions , calf tenderness, cyanosis or clubbing  SKIN: warm  and dry without lesions    NEURO:  alert, approp, nl sensorium with  no motor or cerebellar deficits apparent.                Assessment & Plan:

## 2023-08-06 NOTE — Assessment & Plan Note (Signed)
Quit smoking 02/2013   - PFTs 03/24/2012  FEV1  0.91 (43%) ratio 48 and no better p B2,  DLCO 38% corrects to 53%  - changed to perforomist/bud bid 04/2013  -2015 pulmonary rehab  - med calendar 07/17/13 > did not bring 06/28/2014 , .redone 07/26/2014  - 06/28/2014 p extensive coaching HFA effectiveness =    75% (short Ti) - 08/24/14 started IMPACT trial >finished 08/2015  - PFTs 12/13/14  FEV1 0.85 (38%) ratio 45  p 5 % improvement from saba  09/20/2015 Med calendar  - 05/08/2016 changed lama to incruse  - 11/12/2017  After extensive coaching HFA effectiveness =   75% (short Ti) - 11/12/17 > changed to trelegy   - 02/07/2021  Flare of cough on anoro >  Try gerd rx  - Prednisone ceiling of 20 and floor of 10 mg daily 02/16/21  - 02/21/2021 changed to  performist bid and yupelri q am > improved 03/21/2021  - 08/06/2023 stabilized on pred 10 mg daily   No change rx/ contine palliative care and present meds

## 2023-08-13 ENCOUNTER — Encounter: Payer: Self-pay | Admitting: Podiatry

## 2023-08-13 ENCOUNTER — Ambulatory Visit (INDEPENDENT_AMBULATORY_CARE_PROVIDER_SITE_OTHER): Payer: Medicare Other | Admitting: Podiatry

## 2023-08-13 DIAGNOSIS — N289 Disorder of kidney and ureter, unspecified: Secondary | ICD-10-CM

## 2023-08-13 DIAGNOSIS — B351 Tinea unguium: Secondary | ICD-10-CM | POA: Diagnosis not present

## 2023-08-13 DIAGNOSIS — M79676 Pain in unspecified toe(s): Secondary | ICD-10-CM

## 2023-08-13 NOTE — Progress Notes (Signed)
This patient returns to my office for at risk foot care.  This patient requires this care by a professional since this patient will be at risk due to having  renal insufficiency.  This patient is unable to cut nails herself since the patient cannot reach her nails.These nails are painful walking and wearing shoes.  She presents to the office in a wheelchair with her daughter. This patient presents for at risk foot care today.    General Appearance  Alert, conversant and in no acute stress.  Vascular  Dorsalis pedis and posterior tibial  pulses are weakly  palpable  bilaterally.  Capillary return is within normal limits  Bilaterally. Cold feet. Bilaterally.  Absent digital hair  B/L.  Neurologic  Senn-Weinstein monofilament wire test diminished   bilaterally. Muscle power within normal limits bilaterally.  Nails Thick disfigured discolored nails with subungual debris  from hallux to fifth toes bilaterally.  Orthopedic  No limitations of motion  feet .  No crepitus or effusions noted.  No bony pathology or digital deformities noted.  Skin  normotropic skin with no porokeratosis noted bilaterally.  No signs of infections or ulcers noted.   Brown discoloration dorsum left foot is fading.  Onychomycosis  Pain in right toes  Pain in left toes  Consent was obtained for treatment procedures.   Mechanical debridement of nails 1-5  bilaterally performed with a nail nipper.  Filed with dremel without incident. .   Return office visit   12 weeks               Told patient to return for periodic foot care and evaluation due to potential at risk complications.   Helane Gunther DPM

## 2023-08-28 ENCOUNTER — Telehealth: Payer: Self-pay | Admitting: Family Medicine

## 2023-08-28 NOTE — Telephone Encounter (Signed)
Called PJ advised Dr Susann Givens will be attending and Hospice to manage the symptoms.

## 2023-08-28 NOTE — Telephone Encounter (Signed)
PJ with [facility] called and wants a referral to Hospice for this patient.  Also, do you want to remain the attending? Do you want to manage symptoms or do you want Hospice to take care of?  PJ phone [phone]

## 2023-08-30 DIAGNOSIS — J302 Other seasonal allergic rhinitis: Secondary | ICD-10-CM | POA: Diagnosis not present

## 2023-08-30 DIAGNOSIS — H409 Unspecified glaucoma: Secondary | ICD-10-CM | POA: Diagnosis not present

## 2023-08-30 DIAGNOSIS — J449 Chronic obstructive pulmonary disease, unspecified: Secondary | ICD-10-CM | POA: Diagnosis not present

## 2023-08-30 DIAGNOSIS — E43 Unspecified severe protein-calorie malnutrition: Secondary | ICD-10-CM | POA: Diagnosis not present

## 2023-08-30 DIAGNOSIS — F411 Generalized anxiety disorder: Secondary | ICD-10-CM | POA: Diagnosis not present

## 2023-08-31 DIAGNOSIS — H409 Unspecified glaucoma: Secondary | ICD-10-CM | POA: Diagnosis not present

## 2023-08-31 DIAGNOSIS — J449 Chronic obstructive pulmonary disease, unspecified: Secondary | ICD-10-CM | POA: Diagnosis not present

## 2023-08-31 DIAGNOSIS — F411 Generalized anxiety disorder: Secondary | ICD-10-CM | POA: Diagnosis not present

## 2023-08-31 DIAGNOSIS — E43 Unspecified severe protein-calorie malnutrition: Secondary | ICD-10-CM | POA: Diagnosis not present

## 2023-08-31 DIAGNOSIS — J302 Other seasonal allergic rhinitis: Secondary | ICD-10-CM | POA: Diagnosis not present

## 2023-09-01 DIAGNOSIS — J302 Other seasonal allergic rhinitis: Secondary | ICD-10-CM | POA: Diagnosis not present

## 2023-09-01 DIAGNOSIS — F411 Generalized anxiety disorder: Secondary | ICD-10-CM | POA: Diagnosis not present

## 2023-09-01 DIAGNOSIS — J449 Chronic obstructive pulmonary disease, unspecified: Secondary | ICD-10-CM | POA: Diagnosis not present

## 2023-09-01 DIAGNOSIS — E43 Unspecified severe protein-calorie malnutrition: Secondary | ICD-10-CM | POA: Diagnosis not present

## 2023-09-01 DIAGNOSIS — H409 Unspecified glaucoma: Secondary | ICD-10-CM | POA: Diagnosis not present

## 2023-09-02 DIAGNOSIS — F411 Generalized anxiety disorder: Secondary | ICD-10-CM | POA: Diagnosis not present

## 2023-09-02 DIAGNOSIS — J302 Other seasonal allergic rhinitis: Secondary | ICD-10-CM | POA: Diagnosis not present

## 2023-09-02 DIAGNOSIS — E43 Unspecified severe protein-calorie malnutrition: Secondary | ICD-10-CM | POA: Diagnosis not present

## 2023-09-02 DIAGNOSIS — J449 Chronic obstructive pulmonary disease, unspecified: Secondary | ICD-10-CM | POA: Diagnosis not present

## 2023-09-02 DIAGNOSIS — H409 Unspecified glaucoma: Secondary | ICD-10-CM | POA: Diagnosis not present

## 2023-09-03 DIAGNOSIS — J302 Other seasonal allergic rhinitis: Secondary | ICD-10-CM | POA: Diagnosis not present

## 2023-09-03 DIAGNOSIS — J449 Chronic obstructive pulmonary disease, unspecified: Secondary | ICD-10-CM | POA: Diagnosis not present

## 2023-09-03 DIAGNOSIS — F411 Generalized anxiety disorder: Secondary | ICD-10-CM | POA: Diagnosis not present

## 2023-09-03 DIAGNOSIS — H409 Unspecified glaucoma: Secondary | ICD-10-CM | POA: Diagnosis not present

## 2023-09-03 DIAGNOSIS — E43 Unspecified severe protein-calorie malnutrition: Secondary | ICD-10-CM | POA: Diagnosis not present

## 2023-09-04 DIAGNOSIS — H409 Unspecified glaucoma: Secondary | ICD-10-CM | POA: Diagnosis not present

## 2023-09-04 DIAGNOSIS — J449 Chronic obstructive pulmonary disease, unspecified: Secondary | ICD-10-CM | POA: Diagnosis not present

## 2023-09-04 DIAGNOSIS — E43 Unspecified severe protein-calorie malnutrition: Secondary | ICD-10-CM | POA: Diagnosis not present

## 2023-09-04 DIAGNOSIS — F411 Generalized anxiety disorder: Secondary | ICD-10-CM | POA: Diagnosis not present

## 2023-09-04 DIAGNOSIS — J302 Other seasonal allergic rhinitis: Secondary | ICD-10-CM | POA: Diagnosis not present

## 2023-09-05 DIAGNOSIS — E43 Unspecified severe protein-calorie malnutrition: Secondary | ICD-10-CM | POA: Diagnosis not present

## 2023-09-05 DIAGNOSIS — J302 Other seasonal allergic rhinitis: Secondary | ICD-10-CM | POA: Diagnosis not present

## 2023-09-05 DIAGNOSIS — J449 Chronic obstructive pulmonary disease, unspecified: Secondary | ICD-10-CM | POA: Diagnosis not present

## 2023-09-05 DIAGNOSIS — F411 Generalized anxiety disorder: Secondary | ICD-10-CM | POA: Diagnosis not present

## 2023-09-05 DIAGNOSIS — H409 Unspecified glaucoma: Secondary | ICD-10-CM | POA: Diagnosis not present

## 2023-09-06 DIAGNOSIS — H409 Unspecified glaucoma: Secondary | ICD-10-CM | POA: Diagnosis not present

## 2023-09-06 DIAGNOSIS — F411 Generalized anxiety disorder: Secondary | ICD-10-CM | POA: Diagnosis not present

## 2023-09-06 DIAGNOSIS — J302 Other seasonal allergic rhinitis: Secondary | ICD-10-CM | POA: Diagnosis not present

## 2023-09-06 DIAGNOSIS — E43 Unspecified severe protein-calorie malnutrition: Secondary | ICD-10-CM | POA: Diagnosis not present

## 2023-09-06 DIAGNOSIS — J449 Chronic obstructive pulmonary disease, unspecified: Secondary | ICD-10-CM | POA: Diagnosis not present

## 2023-09-07 DIAGNOSIS — J449 Chronic obstructive pulmonary disease, unspecified: Secondary | ICD-10-CM | POA: Diagnosis not present

## 2023-09-07 DIAGNOSIS — J302 Other seasonal allergic rhinitis: Secondary | ICD-10-CM | POA: Diagnosis not present

## 2023-09-07 DIAGNOSIS — E43 Unspecified severe protein-calorie malnutrition: Secondary | ICD-10-CM | POA: Diagnosis not present

## 2023-09-07 DIAGNOSIS — F411 Generalized anxiety disorder: Secondary | ICD-10-CM | POA: Diagnosis not present

## 2023-09-07 DIAGNOSIS — H409 Unspecified glaucoma: Secondary | ICD-10-CM | POA: Diagnosis not present

## 2023-09-08 DIAGNOSIS — H409 Unspecified glaucoma: Secondary | ICD-10-CM | POA: Diagnosis not present

## 2023-09-08 DIAGNOSIS — E43 Unspecified severe protein-calorie malnutrition: Secondary | ICD-10-CM | POA: Diagnosis not present

## 2023-09-08 DIAGNOSIS — J302 Other seasonal allergic rhinitis: Secondary | ICD-10-CM | POA: Diagnosis not present

## 2023-09-08 DIAGNOSIS — F411 Generalized anxiety disorder: Secondary | ICD-10-CM | POA: Diagnosis not present

## 2023-09-08 DIAGNOSIS — J449 Chronic obstructive pulmonary disease, unspecified: Secondary | ICD-10-CM | POA: Diagnosis not present

## 2023-09-09 DIAGNOSIS — H409 Unspecified glaucoma: Secondary | ICD-10-CM | POA: Diagnosis not present

## 2023-09-09 DIAGNOSIS — J449 Chronic obstructive pulmonary disease, unspecified: Secondary | ICD-10-CM | POA: Diagnosis not present

## 2023-09-09 DIAGNOSIS — E43 Unspecified severe protein-calorie malnutrition: Secondary | ICD-10-CM | POA: Diagnosis not present

## 2023-09-09 DIAGNOSIS — F411 Generalized anxiety disorder: Secondary | ICD-10-CM | POA: Diagnosis not present

## 2023-09-09 DIAGNOSIS — J302 Other seasonal allergic rhinitis: Secondary | ICD-10-CM | POA: Diagnosis not present

## 2023-09-10 DIAGNOSIS — F411 Generalized anxiety disorder: Secondary | ICD-10-CM | POA: Diagnosis not present

## 2023-09-10 DIAGNOSIS — H409 Unspecified glaucoma: Secondary | ICD-10-CM | POA: Diagnosis not present

## 2023-09-10 DIAGNOSIS — J449 Chronic obstructive pulmonary disease, unspecified: Secondary | ICD-10-CM | POA: Diagnosis not present

## 2023-09-10 DIAGNOSIS — E43 Unspecified severe protein-calorie malnutrition: Secondary | ICD-10-CM | POA: Diagnosis not present

## 2023-09-10 DIAGNOSIS — J302 Other seasonal allergic rhinitis: Secondary | ICD-10-CM | POA: Diagnosis not present

## 2023-09-11 DIAGNOSIS — E43 Unspecified severe protein-calorie malnutrition: Secondary | ICD-10-CM | POA: Diagnosis not present

## 2023-09-11 DIAGNOSIS — J449 Chronic obstructive pulmonary disease, unspecified: Secondary | ICD-10-CM | POA: Diagnosis not present

## 2023-09-11 DIAGNOSIS — F411 Generalized anxiety disorder: Secondary | ICD-10-CM | POA: Diagnosis not present

## 2023-09-11 DIAGNOSIS — J302 Other seasonal allergic rhinitis: Secondary | ICD-10-CM | POA: Diagnosis not present

## 2023-09-11 DIAGNOSIS — H409 Unspecified glaucoma: Secondary | ICD-10-CM | POA: Diagnosis not present

## 2023-09-12 DIAGNOSIS — E43 Unspecified severe protein-calorie malnutrition: Secondary | ICD-10-CM | POA: Diagnosis not present

## 2023-09-12 DIAGNOSIS — J449 Chronic obstructive pulmonary disease, unspecified: Secondary | ICD-10-CM | POA: Diagnosis not present

## 2023-09-12 DIAGNOSIS — J302 Other seasonal allergic rhinitis: Secondary | ICD-10-CM | POA: Diagnosis not present

## 2023-09-12 DIAGNOSIS — H409 Unspecified glaucoma: Secondary | ICD-10-CM | POA: Diagnosis not present

## 2023-09-12 DIAGNOSIS — F411 Generalized anxiety disorder: Secondary | ICD-10-CM | POA: Diagnosis not present

## 2023-09-13 DIAGNOSIS — F411 Generalized anxiety disorder: Secondary | ICD-10-CM | POA: Diagnosis not present

## 2023-09-13 DIAGNOSIS — H409 Unspecified glaucoma: Secondary | ICD-10-CM | POA: Diagnosis not present

## 2023-09-13 DIAGNOSIS — J449 Chronic obstructive pulmonary disease, unspecified: Secondary | ICD-10-CM | POA: Diagnosis not present

## 2023-09-13 DIAGNOSIS — E43 Unspecified severe protein-calorie malnutrition: Secondary | ICD-10-CM | POA: Diagnosis not present

## 2023-09-13 DIAGNOSIS — J302 Other seasonal allergic rhinitis: Secondary | ICD-10-CM | POA: Diagnosis not present

## 2023-09-14 DIAGNOSIS — J302 Other seasonal allergic rhinitis: Secondary | ICD-10-CM | POA: Diagnosis not present

## 2023-09-14 DIAGNOSIS — H409 Unspecified glaucoma: Secondary | ICD-10-CM | POA: Diagnosis not present

## 2023-09-14 DIAGNOSIS — E43 Unspecified severe protein-calorie malnutrition: Secondary | ICD-10-CM | POA: Diagnosis not present

## 2023-09-14 DIAGNOSIS — F411 Generalized anxiety disorder: Secondary | ICD-10-CM | POA: Diagnosis not present

## 2023-09-14 DIAGNOSIS — J449 Chronic obstructive pulmonary disease, unspecified: Secondary | ICD-10-CM | POA: Diagnosis not present

## 2023-09-15 DIAGNOSIS — E43 Unspecified severe protein-calorie malnutrition: Secondary | ICD-10-CM | POA: Diagnosis not present

## 2023-09-15 DIAGNOSIS — H409 Unspecified glaucoma: Secondary | ICD-10-CM | POA: Diagnosis not present

## 2023-09-15 DIAGNOSIS — F411 Generalized anxiety disorder: Secondary | ICD-10-CM | POA: Diagnosis not present

## 2023-09-15 DIAGNOSIS — J302 Other seasonal allergic rhinitis: Secondary | ICD-10-CM | POA: Diagnosis not present

## 2023-09-15 DIAGNOSIS — J449 Chronic obstructive pulmonary disease, unspecified: Secondary | ICD-10-CM | POA: Diagnosis not present

## 2023-09-16 DIAGNOSIS — E43 Unspecified severe protein-calorie malnutrition: Secondary | ICD-10-CM | POA: Diagnosis not present

## 2023-09-16 DIAGNOSIS — J302 Other seasonal allergic rhinitis: Secondary | ICD-10-CM | POA: Diagnosis not present

## 2023-09-16 DIAGNOSIS — H409 Unspecified glaucoma: Secondary | ICD-10-CM | POA: Diagnosis not present

## 2023-09-16 DIAGNOSIS — F411 Generalized anxiety disorder: Secondary | ICD-10-CM | POA: Diagnosis not present

## 2023-09-16 DIAGNOSIS — J449 Chronic obstructive pulmonary disease, unspecified: Secondary | ICD-10-CM | POA: Diagnosis not present

## 2023-09-17 DIAGNOSIS — H409 Unspecified glaucoma: Secondary | ICD-10-CM | POA: Diagnosis not present

## 2023-09-17 DIAGNOSIS — F411 Generalized anxiety disorder: Secondary | ICD-10-CM | POA: Diagnosis not present

## 2023-09-17 DIAGNOSIS — J302 Other seasonal allergic rhinitis: Secondary | ICD-10-CM | POA: Diagnosis not present

## 2023-09-17 DIAGNOSIS — E43 Unspecified severe protein-calorie malnutrition: Secondary | ICD-10-CM | POA: Diagnosis not present

## 2023-09-17 DIAGNOSIS — J449 Chronic obstructive pulmonary disease, unspecified: Secondary | ICD-10-CM | POA: Diagnosis not present

## 2023-09-18 DIAGNOSIS — H409 Unspecified glaucoma: Secondary | ICD-10-CM | POA: Diagnosis not present

## 2023-09-18 DIAGNOSIS — E43 Unspecified severe protein-calorie malnutrition: Secondary | ICD-10-CM | POA: Diagnosis not present

## 2023-09-18 DIAGNOSIS — J302 Other seasonal allergic rhinitis: Secondary | ICD-10-CM | POA: Diagnosis not present

## 2023-09-18 DIAGNOSIS — J449 Chronic obstructive pulmonary disease, unspecified: Secondary | ICD-10-CM | POA: Diagnosis not present

## 2023-09-18 DIAGNOSIS — F411 Generalized anxiety disorder: Secondary | ICD-10-CM | POA: Diagnosis not present

## 2023-09-19 DIAGNOSIS — J302 Other seasonal allergic rhinitis: Secondary | ICD-10-CM | POA: Diagnosis not present

## 2023-09-19 DIAGNOSIS — F411 Generalized anxiety disorder: Secondary | ICD-10-CM | POA: Diagnosis not present

## 2023-09-19 DIAGNOSIS — H409 Unspecified glaucoma: Secondary | ICD-10-CM | POA: Diagnosis not present

## 2023-09-19 DIAGNOSIS — E43 Unspecified severe protein-calorie malnutrition: Secondary | ICD-10-CM | POA: Diagnosis not present

## 2023-09-19 DIAGNOSIS — J449 Chronic obstructive pulmonary disease, unspecified: Secondary | ICD-10-CM | POA: Diagnosis not present

## 2023-09-20 DIAGNOSIS — J302 Other seasonal allergic rhinitis: Secondary | ICD-10-CM | POA: Diagnosis not present

## 2023-09-20 DIAGNOSIS — E43 Unspecified severe protein-calorie malnutrition: Secondary | ICD-10-CM | POA: Diagnosis not present

## 2023-09-20 DIAGNOSIS — H409 Unspecified glaucoma: Secondary | ICD-10-CM | POA: Diagnosis not present

## 2023-09-20 DIAGNOSIS — F411 Generalized anxiety disorder: Secondary | ICD-10-CM | POA: Diagnosis not present

## 2023-09-20 DIAGNOSIS — J449 Chronic obstructive pulmonary disease, unspecified: Secondary | ICD-10-CM | POA: Diagnosis not present

## 2023-09-21 DIAGNOSIS — J449 Chronic obstructive pulmonary disease, unspecified: Secondary | ICD-10-CM | POA: Diagnosis not present

## 2023-09-21 DIAGNOSIS — J302 Other seasonal allergic rhinitis: Secondary | ICD-10-CM | POA: Diagnosis not present

## 2023-09-21 DIAGNOSIS — H409 Unspecified glaucoma: Secondary | ICD-10-CM | POA: Diagnosis not present

## 2023-09-21 DIAGNOSIS — F411 Generalized anxiety disorder: Secondary | ICD-10-CM | POA: Diagnosis not present

## 2023-09-21 DIAGNOSIS — E43 Unspecified severe protein-calorie malnutrition: Secondary | ICD-10-CM | POA: Diagnosis not present

## 2023-09-22 DIAGNOSIS — H409 Unspecified glaucoma: Secondary | ICD-10-CM | POA: Diagnosis not present

## 2023-09-22 DIAGNOSIS — J302 Other seasonal allergic rhinitis: Secondary | ICD-10-CM | POA: Diagnosis not present

## 2023-09-22 DIAGNOSIS — F411 Generalized anxiety disorder: Secondary | ICD-10-CM | POA: Diagnosis not present

## 2023-09-22 DIAGNOSIS — J449 Chronic obstructive pulmonary disease, unspecified: Secondary | ICD-10-CM | POA: Diagnosis not present

## 2023-09-22 DIAGNOSIS — E43 Unspecified severe protein-calorie malnutrition: Secondary | ICD-10-CM | POA: Diagnosis not present

## 2023-09-23 DIAGNOSIS — F411 Generalized anxiety disorder: Secondary | ICD-10-CM | POA: Diagnosis not present

## 2023-09-23 DIAGNOSIS — E43 Unspecified severe protein-calorie malnutrition: Secondary | ICD-10-CM | POA: Diagnosis not present

## 2023-09-23 DIAGNOSIS — H409 Unspecified glaucoma: Secondary | ICD-10-CM | POA: Diagnosis not present

## 2023-09-23 DIAGNOSIS — J449 Chronic obstructive pulmonary disease, unspecified: Secondary | ICD-10-CM | POA: Diagnosis not present

## 2023-09-23 DIAGNOSIS — J302 Other seasonal allergic rhinitis: Secondary | ICD-10-CM | POA: Diagnosis not present

## 2023-09-24 DIAGNOSIS — J302 Other seasonal allergic rhinitis: Secondary | ICD-10-CM | POA: Diagnosis not present

## 2023-09-24 DIAGNOSIS — J449 Chronic obstructive pulmonary disease, unspecified: Secondary | ICD-10-CM | POA: Diagnosis not present

## 2023-09-24 DIAGNOSIS — E43 Unspecified severe protein-calorie malnutrition: Secondary | ICD-10-CM | POA: Diagnosis not present

## 2023-09-24 DIAGNOSIS — F411 Generalized anxiety disorder: Secondary | ICD-10-CM | POA: Diagnosis not present

## 2023-09-24 DIAGNOSIS — H409 Unspecified glaucoma: Secondary | ICD-10-CM | POA: Diagnosis not present

## 2023-09-25 DIAGNOSIS — E43 Unspecified severe protein-calorie malnutrition: Secondary | ICD-10-CM | POA: Diagnosis not present

## 2023-09-25 DIAGNOSIS — H409 Unspecified glaucoma: Secondary | ICD-10-CM | POA: Diagnosis not present

## 2023-09-25 DIAGNOSIS — J302 Other seasonal allergic rhinitis: Secondary | ICD-10-CM | POA: Diagnosis not present

## 2023-09-25 DIAGNOSIS — F411 Generalized anxiety disorder: Secondary | ICD-10-CM | POA: Diagnosis not present

## 2023-09-25 DIAGNOSIS — J449 Chronic obstructive pulmonary disease, unspecified: Secondary | ICD-10-CM | POA: Diagnosis not present

## 2023-09-26 DIAGNOSIS — F411 Generalized anxiety disorder: Secondary | ICD-10-CM | POA: Diagnosis not present

## 2023-09-26 DIAGNOSIS — E43 Unspecified severe protein-calorie malnutrition: Secondary | ICD-10-CM | POA: Diagnosis not present

## 2023-09-26 DIAGNOSIS — J449 Chronic obstructive pulmonary disease, unspecified: Secondary | ICD-10-CM | POA: Diagnosis not present

## 2023-09-26 DIAGNOSIS — J302 Other seasonal allergic rhinitis: Secondary | ICD-10-CM | POA: Diagnosis not present

## 2023-09-26 DIAGNOSIS — H409 Unspecified glaucoma: Secondary | ICD-10-CM | POA: Diagnosis not present

## 2023-09-27 DIAGNOSIS — E43 Unspecified severe protein-calorie malnutrition: Secondary | ICD-10-CM | POA: Diagnosis not present

## 2023-09-27 DIAGNOSIS — J302 Other seasonal allergic rhinitis: Secondary | ICD-10-CM | POA: Diagnosis not present

## 2023-09-27 DIAGNOSIS — F411 Generalized anxiety disorder: Secondary | ICD-10-CM | POA: Diagnosis not present

## 2023-09-27 DIAGNOSIS — J449 Chronic obstructive pulmonary disease, unspecified: Secondary | ICD-10-CM | POA: Diagnosis not present

## 2023-09-27 DIAGNOSIS — H409 Unspecified glaucoma: Secondary | ICD-10-CM | POA: Diagnosis not present

## 2023-09-28 DIAGNOSIS — J449 Chronic obstructive pulmonary disease, unspecified: Secondary | ICD-10-CM | POA: Diagnosis not present

## 2023-09-28 DIAGNOSIS — J302 Other seasonal allergic rhinitis: Secondary | ICD-10-CM | POA: Diagnosis not present

## 2023-09-28 DIAGNOSIS — F411 Generalized anxiety disorder: Secondary | ICD-10-CM | POA: Diagnosis not present

## 2023-09-28 DIAGNOSIS — E43 Unspecified severe protein-calorie malnutrition: Secondary | ICD-10-CM | POA: Diagnosis not present

## 2023-09-28 DIAGNOSIS — H409 Unspecified glaucoma: Secondary | ICD-10-CM | POA: Diagnosis not present

## 2023-09-29 DIAGNOSIS — J302 Other seasonal allergic rhinitis: Secondary | ICD-10-CM | POA: Diagnosis not present

## 2023-09-29 DIAGNOSIS — H409 Unspecified glaucoma: Secondary | ICD-10-CM | POA: Diagnosis not present

## 2023-09-29 DIAGNOSIS — F411 Generalized anxiety disorder: Secondary | ICD-10-CM | POA: Diagnosis not present

## 2023-09-29 DIAGNOSIS — J449 Chronic obstructive pulmonary disease, unspecified: Secondary | ICD-10-CM | POA: Diagnosis not present

## 2023-09-29 DIAGNOSIS — E43 Unspecified severe protein-calorie malnutrition: Secondary | ICD-10-CM | POA: Diagnosis not present

## 2023-09-30 DIAGNOSIS — J302 Other seasonal allergic rhinitis: Secondary | ICD-10-CM | POA: Diagnosis not present

## 2023-09-30 DIAGNOSIS — E43 Unspecified severe protein-calorie malnutrition: Secondary | ICD-10-CM | POA: Diagnosis not present

## 2023-09-30 DIAGNOSIS — F411 Generalized anxiety disorder: Secondary | ICD-10-CM | POA: Diagnosis not present

## 2023-09-30 DIAGNOSIS — H409 Unspecified glaucoma: Secondary | ICD-10-CM | POA: Diagnosis not present

## 2023-09-30 DIAGNOSIS — J449 Chronic obstructive pulmonary disease, unspecified: Secondary | ICD-10-CM | POA: Diagnosis not present

## 2023-10-01 DIAGNOSIS — J449 Chronic obstructive pulmonary disease, unspecified: Secondary | ICD-10-CM | POA: Diagnosis not present

## 2023-10-01 DIAGNOSIS — H409 Unspecified glaucoma: Secondary | ICD-10-CM | POA: Diagnosis not present

## 2023-10-01 DIAGNOSIS — E43 Unspecified severe protein-calorie malnutrition: Secondary | ICD-10-CM | POA: Diagnosis not present

## 2023-10-01 DIAGNOSIS — F411 Generalized anxiety disorder: Secondary | ICD-10-CM | POA: Diagnosis not present

## 2023-10-01 DIAGNOSIS — J302 Other seasonal allergic rhinitis: Secondary | ICD-10-CM | POA: Diagnosis not present

## 2023-10-02 DIAGNOSIS — F411 Generalized anxiety disorder: Secondary | ICD-10-CM | POA: Diagnosis not present

## 2023-10-02 DIAGNOSIS — H409 Unspecified glaucoma: Secondary | ICD-10-CM | POA: Diagnosis not present

## 2023-10-02 DIAGNOSIS — E43 Unspecified severe protein-calorie malnutrition: Secondary | ICD-10-CM | POA: Diagnosis not present

## 2023-10-02 DIAGNOSIS — J449 Chronic obstructive pulmonary disease, unspecified: Secondary | ICD-10-CM | POA: Diagnosis not present

## 2023-10-02 DIAGNOSIS — J302 Other seasonal allergic rhinitis: Secondary | ICD-10-CM | POA: Diagnosis not present

## 2023-10-03 DIAGNOSIS — J449 Chronic obstructive pulmonary disease, unspecified: Secondary | ICD-10-CM | POA: Diagnosis not present

## 2023-10-03 DIAGNOSIS — J302 Other seasonal allergic rhinitis: Secondary | ICD-10-CM | POA: Diagnosis not present

## 2023-10-03 DIAGNOSIS — H409 Unspecified glaucoma: Secondary | ICD-10-CM | POA: Diagnosis not present

## 2023-10-03 DIAGNOSIS — E43 Unspecified severe protein-calorie malnutrition: Secondary | ICD-10-CM | POA: Diagnosis not present

## 2023-10-03 DIAGNOSIS — F411 Generalized anxiety disorder: Secondary | ICD-10-CM | POA: Diagnosis not present

## 2023-10-04 DIAGNOSIS — F411 Generalized anxiety disorder: Secondary | ICD-10-CM | POA: Diagnosis not present

## 2023-10-04 DIAGNOSIS — J449 Chronic obstructive pulmonary disease, unspecified: Secondary | ICD-10-CM | POA: Diagnosis not present

## 2023-10-04 DIAGNOSIS — J302 Other seasonal allergic rhinitis: Secondary | ICD-10-CM | POA: Diagnosis not present

## 2023-10-04 DIAGNOSIS — E43 Unspecified severe protein-calorie malnutrition: Secondary | ICD-10-CM | POA: Diagnosis not present

## 2023-10-04 DIAGNOSIS — H409 Unspecified glaucoma: Secondary | ICD-10-CM | POA: Diagnosis not present

## 2023-10-05 DIAGNOSIS — J449 Chronic obstructive pulmonary disease, unspecified: Secondary | ICD-10-CM | POA: Diagnosis not present

## 2023-10-05 DIAGNOSIS — J302 Other seasonal allergic rhinitis: Secondary | ICD-10-CM | POA: Diagnosis not present

## 2023-10-05 DIAGNOSIS — E43 Unspecified severe protein-calorie malnutrition: Secondary | ICD-10-CM | POA: Diagnosis not present

## 2023-10-05 DIAGNOSIS — F411 Generalized anxiety disorder: Secondary | ICD-10-CM | POA: Diagnosis not present

## 2023-10-05 DIAGNOSIS — H409 Unspecified glaucoma: Secondary | ICD-10-CM | POA: Diagnosis not present

## 2023-10-06 DIAGNOSIS — J302 Other seasonal allergic rhinitis: Secondary | ICD-10-CM | POA: Diagnosis not present

## 2023-10-06 DIAGNOSIS — J449 Chronic obstructive pulmonary disease, unspecified: Secondary | ICD-10-CM | POA: Diagnosis not present

## 2023-10-06 DIAGNOSIS — E43 Unspecified severe protein-calorie malnutrition: Secondary | ICD-10-CM | POA: Diagnosis not present

## 2023-10-06 DIAGNOSIS — F411 Generalized anxiety disorder: Secondary | ICD-10-CM | POA: Diagnosis not present

## 2023-10-06 DIAGNOSIS — H409 Unspecified glaucoma: Secondary | ICD-10-CM | POA: Diagnosis not present

## 2023-10-07 DIAGNOSIS — J302 Other seasonal allergic rhinitis: Secondary | ICD-10-CM | POA: Diagnosis not present

## 2023-10-07 DIAGNOSIS — E43 Unspecified severe protein-calorie malnutrition: Secondary | ICD-10-CM | POA: Diagnosis not present

## 2023-10-07 DIAGNOSIS — J449 Chronic obstructive pulmonary disease, unspecified: Secondary | ICD-10-CM | POA: Diagnosis not present

## 2023-10-07 DIAGNOSIS — H409 Unspecified glaucoma: Secondary | ICD-10-CM | POA: Diagnosis not present

## 2023-10-07 DIAGNOSIS — F411 Generalized anxiety disorder: Secondary | ICD-10-CM | POA: Diagnosis not present

## 2023-10-08 DIAGNOSIS — E43 Unspecified severe protein-calorie malnutrition: Secondary | ICD-10-CM | POA: Diagnosis not present

## 2023-10-08 DIAGNOSIS — F411 Generalized anxiety disorder: Secondary | ICD-10-CM | POA: Diagnosis not present

## 2023-10-08 DIAGNOSIS — J449 Chronic obstructive pulmonary disease, unspecified: Secondary | ICD-10-CM | POA: Diagnosis not present

## 2023-10-08 DIAGNOSIS — J302 Other seasonal allergic rhinitis: Secondary | ICD-10-CM | POA: Diagnosis not present

## 2023-10-08 DIAGNOSIS — H409 Unspecified glaucoma: Secondary | ICD-10-CM | POA: Diagnosis not present

## 2023-10-09 DIAGNOSIS — F411 Generalized anxiety disorder: Secondary | ICD-10-CM | POA: Diagnosis not present

## 2023-10-09 DIAGNOSIS — H409 Unspecified glaucoma: Secondary | ICD-10-CM | POA: Diagnosis not present

## 2023-10-09 DIAGNOSIS — J449 Chronic obstructive pulmonary disease, unspecified: Secondary | ICD-10-CM | POA: Diagnosis not present

## 2023-10-09 DIAGNOSIS — J302 Other seasonal allergic rhinitis: Secondary | ICD-10-CM | POA: Diagnosis not present

## 2023-10-09 DIAGNOSIS — E43 Unspecified severe protein-calorie malnutrition: Secondary | ICD-10-CM | POA: Diagnosis not present

## 2023-10-10 DIAGNOSIS — J302 Other seasonal allergic rhinitis: Secondary | ICD-10-CM | POA: Diagnosis not present

## 2023-10-10 DIAGNOSIS — J449 Chronic obstructive pulmonary disease, unspecified: Secondary | ICD-10-CM | POA: Diagnosis not present

## 2023-10-10 DIAGNOSIS — F411 Generalized anxiety disorder: Secondary | ICD-10-CM | POA: Diagnosis not present

## 2023-10-10 DIAGNOSIS — H409 Unspecified glaucoma: Secondary | ICD-10-CM | POA: Diagnosis not present

## 2023-10-10 DIAGNOSIS — E43 Unspecified severe protein-calorie malnutrition: Secondary | ICD-10-CM | POA: Diagnosis not present

## 2023-10-11 DIAGNOSIS — J449 Chronic obstructive pulmonary disease, unspecified: Secondary | ICD-10-CM | POA: Diagnosis not present

## 2023-10-11 DIAGNOSIS — F411 Generalized anxiety disorder: Secondary | ICD-10-CM | POA: Diagnosis not present

## 2023-10-11 DIAGNOSIS — J302 Other seasonal allergic rhinitis: Secondary | ICD-10-CM | POA: Diagnosis not present

## 2023-10-11 DIAGNOSIS — E43 Unspecified severe protein-calorie malnutrition: Secondary | ICD-10-CM | POA: Diagnosis not present

## 2023-10-11 DIAGNOSIS — H409 Unspecified glaucoma: Secondary | ICD-10-CM | POA: Diagnosis not present

## 2023-10-12 DIAGNOSIS — E43 Unspecified severe protein-calorie malnutrition: Secondary | ICD-10-CM | POA: Diagnosis not present

## 2023-10-12 DIAGNOSIS — F411 Generalized anxiety disorder: Secondary | ICD-10-CM | POA: Diagnosis not present

## 2023-10-12 DIAGNOSIS — J449 Chronic obstructive pulmonary disease, unspecified: Secondary | ICD-10-CM | POA: Diagnosis not present

## 2023-10-12 DIAGNOSIS — J302 Other seasonal allergic rhinitis: Secondary | ICD-10-CM | POA: Diagnosis not present

## 2023-10-12 DIAGNOSIS — H409 Unspecified glaucoma: Secondary | ICD-10-CM | POA: Diagnosis not present

## 2023-10-13 DIAGNOSIS — F411 Generalized anxiety disorder: Secondary | ICD-10-CM | POA: Diagnosis not present

## 2023-10-13 DIAGNOSIS — J302 Other seasonal allergic rhinitis: Secondary | ICD-10-CM | POA: Diagnosis not present

## 2023-10-13 DIAGNOSIS — E43 Unspecified severe protein-calorie malnutrition: Secondary | ICD-10-CM | POA: Diagnosis not present

## 2023-10-13 DIAGNOSIS — J449 Chronic obstructive pulmonary disease, unspecified: Secondary | ICD-10-CM | POA: Diagnosis not present

## 2023-10-13 DIAGNOSIS — H409 Unspecified glaucoma: Secondary | ICD-10-CM | POA: Diagnosis not present

## 2023-10-14 DIAGNOSIS — E43 Unspecified severe protein-calorie malnutrition: Secondary | ICD-10-CM | POA: Diagnosis not present

## 2023-10-14 DIAGNOSIS — F411 Generalized anxiety disorder: Secondary | ICD-10-CM | POA: Diagnosis not present

## 2023-10-14 DIAGNOSIS — J449 Chronic obstructive pulmonary disease, unspecified: Secondary | ICD-10-CM | POA: Diagnosis not present

## 2023-10-14 DIAGNOSIS — J302 Other seasonal allergic rhinitis: Secondary | ICD-10-CM | POA: Diagnosis not present

## 2023-10-14 DIAGNOSIS — H409 Unspecified glaucoma: Secondary | ICD-10-CM | POA: Diagnosis not present

## 2023-10-15 DIAGNOSIS — F411 Generalized anxiety disorder: Secondary | ICD-10-CM | POA: Diagnosis not present

## 2023-10-15 DIAGNOSIS — J302 Other seasonal allergic rhinitis: Secondary | ICD-10-CM | POA: Diagnosis not present

## 2023-10-15 DIAGNOSIS — H409 Unspecified glaucoma: Secondary | ICD-10-CM | POA: Diagnosis not present

## 2023-10-15 DIAGNOSIS — J449 Chronic obstructive pulmonary disease, unspecified: Secondary | ICD-10-CM | POA: Diagnosis not present

## 2023-10-15 DIAGNOSIS — E43 Unspecified severe protein-calorie malnutrition: Secondary | ICD-10-CM | POA: Diagnosis not present

## 2023-10-16 DIAGNOSIS — J302 Other seasonal allergic rhinitis: Secondary | ICD-10-CM | POA: Diagnosis not present

## 2023-10-16 DIAGNOSIS — F411 Generalized anxiety disorder: Secondary | ICD-10-CM | POA: Diagnosis not present

## 2023-10-16 DIAGNOSIS — J449 Chronic obstructive pulmonary disease, unspecified: Secondary | ICD-10-CM | POA: Diagnosis not present

## 2023-10-16 DIAGNOSIS — H409 Unspecified glaucoma: Secondary | ICD-10-CM | POA: Diagnosis not present

## 2023-10-16 DIAGNOSIS — E43 Unspecified severe protein-calorie malnutrition: Secondary | ICD-10-CM | POA: Diagnosis not present

## 2023-10-17 DIAGNOSIS — H409 Unspecified glaucoma: Secondary | ICD-10-CM | POA: Diagnosis not present

## 2023-10-17 DIAGNOSIS — J302 Other seasonal allergic rhinitis: Secondary | ICD-10-CM | POA: Diagnosis not present

## 2023-10-17 DIAGNOSIS — J449 Chronic obstructive pulmonary disease, unspecified: Secondary | ICD-10-CM | POA: Diagnosis not present

## 2023-10-17 DIAGNOSIS — E43 Unspecified severe protein-calorie malnutrition: Secondary | ICD-10-CM | POA: Diagnosis not present

## 2023-10-17 DIAGNOSIS — F411 Generalized anxiety disorder: Secondary | ICD-10-CM | POA: Diagnosis not present

## 2023-10-18 DIAGNOSIS — J449 Chronic obstructive pulmonary disease, unspecified: Secondary | ICD-10-CM | POA: Diagnosis not present

## 2023-10-18 DIAGNOSIS — H409 Unspecified glaucoma: Secondary | ICD-10-CM | POA: Diagnosis not present

## 2023-10-18 DIAGNOSIS — F411 Generalized anxiety disorder: Secondary | ICD-10-CM | POA: Diagnosis not present

## 2023-10-18 DIAGNOSIS — E43 Unspecified severe protein-calorie malnutrition: Secondary | ICD-10-CM | POA: Diagnosis not present

## 2023-10-18 DIAGNOSIS — J302 Other seasonal allergic rhinitis: Secondary | ICD-10-CM | POA: Diagnosis not present

## 2023-10-19 DIAGNOSIS — J449 Chronic obstructive pulmonary disease, unspecified: Secondary | ICD-10-CM | POA: Diagnosis not present

## 2023-10-19 DIAGNOSIS — E43 Unspecified severe protein-calorie malnutrition: Secondary | ICD-10-CM | POA: Diagnosis not present

## 2023-10-19 DIAGNOSIS — H409 Unspecified glaucoma: Secondary | ICD-10-CM | POA: Diagnosis not present

## 2023-10-19 DIAGNOSIS — F411 Generalized anxiety disorder: Secondary | ICD-10-CM | POA: Diagnosis not present

## 2023-10-19 DIAGNOSIS — J302 Other seasonal allergic rhinitis: Secondary | ICD-10-CM | POA: Diagnosis not present

## 2023-10-20 DIAGNOSIS — J302 Other seasonal allergic rhinitis: Secondary | ICD-10-CM | POA: Diagnosis not present

## 2023-10-20 DIAGNOSIS — J449 Chronic obstructive pulmonary disease, unspecified: Secondary | ICD-10-CM | POA: Diagnosis not present

## 2023-10-20 DIAGNOSIS — F411 Generalized anxiety disorder: Secondary | ICD-10-CM | POA: Diagnosis not present

## 2023-10-20 DIAGNOSIS — H409 Unspecified glaucoma: Secondary | ICD-10-CM | POA: Diagnosis not present

## 2023-10-20 DIAGNOSIS — E43 Unspecified severe protein-calorie malnutrition: Secondary | ICD-10-CM | POA: Diagnosis not present

## 2023-10-21 DIAGNOSIS — E43 Unspecified severe protein-calorie malnutrition: Secondary | ICD-10-CM | POA: Diagnosis not present

## 2023-10-21 DIAGNOSIS — J302 Other seasonal allergic rhinitis: Secondary | ICD-10-CM | POA: Diagnosis not present

## 2023-10-21 DIAGNOSIS — H409 Unspecified glaucoma: Secondary | ICD-10-CM | POA: Diagnosis not present

## 2023-10-21 DIAGNOSIS — J449 Chronic obstructive pulmonary disease, unspecified: Secondary | ICD-10-CM | POA: Diagnosis not present

## 2023-10-21 DIAGNOSIS — F411 Generalized anxiety disorder: Secondary | ICD-10-CM | POA: Diagnosis not present

## 2023-10-22 DIAGNOSIS — H409 Unspecified glaucoma: Secondary | ICD-10-CM | POA: Diagnosis not present

## 2023-10-22 DIAGNOSIS — E43 Unspecified severe protein-calorie malnutrition: Secondary | ICD-10-CM | POA: Diagnosis not present

## 2023-10-22 DIAGNOSIS — J302 Other seasonal allergic rhinitis: Secondary | ICD-10-CM | POA: Diagnosis not present

## 2023-10-22 DIAGNOSIS — F411 Generalized anxiety disorder: Secondary | ICD-10-CM | POA: Diagnosis not present

## 2023-10-22 DIAGNOSIS — J449 Chronic obstructive pulmonary disease, unspecified: Secondary | ICD-10-CM | POA: Diagnosis not present

## 2023-10-23 DIAGNOSIS — J302 Other seasonal allergic rhinitis: Secondary | ICD-10-CM | POA: Diagnosis not present

## 2023-10-23 DIAGNOSIS — H409 Unspecified glaucoma: Secondary | ICD-10-CM | POA: Diagnosis not present

## 2023-10-23 DIAGNOSIS — J449 Chronic obstructive pulmonary disease, unspecified: Secondary | ICD-10-CM | POA: Diagnosis not present

## 2023-10-23 DIAGNOSIS — F411 Generalized anxiety disorder: Secondary | ICD-10-CM | POA: Diagnosis not present

## 2023-10-23 DIAGNOSIS — E43 Unspecified severe protein-calorie malnutrition: Secondary | ICD-10-CM | POA: Diagnosis not present

## 2023-10-24 DIAGNOSIS — F411 Generalized anxiety disorder: Secondary | ICD-10-CM | POA: Diagnosis not present

## 2023-10-24 DIAGNOSIS — E43 Unspecified severe protein-calorie malnutrition: Secondary | ICD-10-CM | POA: Diagnosis not present

## 2023-10-24 DIAGNOSIS — H409 Unspecified glaucoma: Secondary | ICD-10-CM | POA: Diagnosis not present

## 2023-10-24 DIAGNOSIS — J449 Chronic obstructive pulmonary disease, unspecified: Secondary | ICD-10-CM | POA: Diagnosis not present

## 2023-10-24 DIAGNOSIS — J302 Other seasonal allergic rhinitis: Secondary | ICD-10-CM | POA: Diagnosis not present

## 2023-10-25 DIAGNOSIS — J449 Chronic obstructive pulmonary disease, unspecified: Secondary | ICD-10-CM | POA: Diagnosis not present

## 2023-10-25 DIAGNOSIS — E43 Unspecified severe protein-calorie malnutrition: Secondary | ICD-10-CM | POA: Diagnosis not present

## 2023-10-25 DIAGNOSIS — J302 Other seasonal allergic rhinitis: Secondary | ICD-10-CM | POA: Diagnosis not present

## 2023-10-25 DIAGNOSIS — F411 Generalized anxiety disorder: Secondary | ICD-10-CM | POA: Diagnosis not present

## 2023-10-25 DIAGNOSIS — H409 Unspecified glaucoma: Secondary | ICD-10-CM | POA: Diagnosis not present

## 2023-10-28 DIAGNOSIS — F411 Generalized anxiety disorder: Secondary | ICD-10-CM | POA: Diagnosis not present

## 2023-10-28 DIAGNOSIS — J449 Chronic obstructive pulmonary disease, unspecified: Secondary | ICD-10-CM | POA: Diagnosis not present

## 2023-10-28 DIAGNOSIS — J302 Other seasonal allergic rhinitis: Secondary | ICD-10-CM | POA: Diagnosis not present

## 2023-10-28 DIAGNOSIS — E43 Unspecified severe protein-calorie malnutrition: Secondary | ICD-10-CM | POA: Diagnosis not present

## 2023-10-28 DIAGNOSIS — H409 Unspecified glaucoma: Secondary | ICD-10-CM | POA: Diagnosis not present

## 2023-10-29 ENCOUNTER — Telehealth: Payer: Self-pay | Admitting: Internal Medicine

## 2023-10-29 DIAGNOSIS — J449 Chronic obstructive pulmonary disease, unspecified: Secondary | ICD-10-CM | POA: Diagnosis not present

## 2023-10-29 DIAGNOSIS — F411 Generalized anxiety disorder: Secondary | ICD-10-CM | POA: Diagnosis not present

## 2023-10-29 DIAGNOSIS — E43 Unspecified severe protein-calorie malnutrition: Secondary | ICD-10-CM | POA: Diagnosis not present

## 2023-10-29 DIAGNOSIS — J302 Other seasonal allergic rhinitis: Secondary | ICD-10-CM | POA: Diagnosis not present

## 2023-10-29 DIAGNOSIS — H409 Unspecified glaucoma: Secondary | ICD-10-CM | POA: Diagnosis not present

## 2023-10-29 NOTE — Telephone Encounter (Signed)
Patient is no longer in hospice and needs a refill on her medication for her nebulizer. Formoterol nebulizer solution and  yupelri.   Its usually mailed to her through medicare.

## 2023-10-30 NOTE — Telephone Encounter (Signed)
Address: [facility]  PT wanted a CB today about this. Mom will be off hospice and has none. Daughter is [phone]

## 2023-10-30 NOTE — Telephone Encounter (Signed)
Spoke to patient and received verbal to speak with patient's daughter, [name]. [name] is requesting refill on Yupelri, prednisone 10mg  and perforomist.  Patient is going off of hospice and would like to resume meds.   Dr. Sherene Sires, please advise. Thanks

## 2023-10-30 NOTE — Telephone Encounter (Signed)
That's fine

## 2023-10-31 DIAGNOSIS — H409 Unspecified glaucoma: Secondary | ICD-10-CM | POA: Diagnosis not present

## 2023-10-31 DIAGNOSIS — F411 Generalized anxiety disorder: Secondary | ICD-10-CM | POA: Diagnosis not present

## 2023-10-31 DIAGNOSIS — J302 Other seasonal allergic rhinitis: Secondary | ICD-10-CM | POA: Diagnosis not present

## 2023-10-31 DIAGNOSIS — E43 Unspecified severe protein-calorie malnutrition: Secondary | ICD-10-CM | POA: Diagnosis not present

## 2023-10-31 DIAGNOSIS — J449 Chronic obstructive pulmonary disease, unspecified: Secondary | ICD-10-CM | POA: Diagnosis not present

## 2023-10-31 MED ORDER — FORMOTEROL FUMARATE 20 MCG/2ML IN NEBU: INHALATION_SOLUTION | RESPIRATORY_TRACT | 11 refills | Status: DC

## 2023-10-31 MED ORDER — PREDNISONE 10 MG PO TABS: 10.0000 mg | ORAL_TABLET | Freq: Every day | ORAL | 2 refills | Status: DC

## 2023-10-31 MED ORDER — YUPELRI 175 MCG/3ML IN SOLN: RESPIRATORY_TRACT | 11 refills | Status: DC

## 2023-10-31 NOTE — Telephone Encounter (Signed)
Spoke with patient's daughter AV:[id number] to use. Pred sent to [facility], other sent to [facility]. Nothing further needed at this time.

## 2023-11-04 ENCOUNTER — Other Ambulatory Visit (INDEPENDENT_AMBULATORY_CARE_PROVIDER_SITE_OTHER): Payer: Medicare Other

## 2023-11-04 DIAGNOSIS — Z23 Encounter for immunization: Secondary | ICD-10-CM | POA: Diagnosis not present

## 2023-11-08 ENCOUNTER — Other Ambulatory Visit: Payer: Self-pay | Admitting: Internal Medicine

## 2023-11-12 ENCOUNTER — Telehealth: Payer: Self-pay | Admitting: Internal Medicine

## 2023-11-12 NOTE — Telephone Encounter (Signed)
[facility] PT states they have sent in a RX for her that has not been filled. Zpack

## 2023-11-13 ENCOUNTER — Ambulatory Visit: Payer: Medicare Other | Admitting: Podiatry

## 2023-11-14 NOTE — Telephone Encounter (Signed)
Patient needs refill of Zpack. Daughter would like a call when prescription has been filled [phone]  [facility]

## 2023-11-15 MED ORDER — AZITHROMYCIN 250 MG PO TABS: 250.0000 mg | ORAL_TABLET | ORAL | 5 refills | Status: DC

## 2023-11-15 NOTE — Telephone Encounter (Signed)
Daughter calling again. The RX for a ZPACK is still not at the pharmacy.

## 2023-11-15 NOTE — Telephone Encounter (Signed)
Per Dr Sherene Sires ok for refillable zpack  Rx was sent  Spoke with the pt's daughter and made her aware  Nothing further needed

## 2024-02-12 ENCOUNTER — Telehealth: Payer: Self-pay | Admitting: Internal Medicine

## 2024-02-12 NOTE — Telephone Encounter (Signed)
 Increase in sob for about a week.  She is coughing up mucous, but it is clear.  She is having same amount of mucous as usual.  She is more sob at rest and with exertion.  Her oxygen is currently set a 3L and her oxygen level is HR is 89-90 and Oxygen level is 92-93% at rest.  She is not using albuterol inhaler, however, she is using albuterol nebulizer solution on a schedule and does get relief.  She is using the performist and yupelri nebulizers as prescribed.  She is also taking her Prednisone 10 mg as prescribed.  [facility] is asking if it is ok for patient to increase oxygen to 4-5L.  Dr. Sherene Sires, please advise.  Thank you.

## 2024-02-12 NOTE — Telephone Encounter (Signed)
 Palliative Care Calling. Patient is having a hard time breathing and is wondering if she can increase her oxygen to 4-5 Liters.

## 2024-02-12 NOTE — Telephone Encounter (Signed)
 Increase 02 for sats > 90%at max of 4-5 lpm   Double prednisone until better then back to one daily

## 2024-02-13 NOTE — Telephone Encounter (Signed)
 Lm for [place] with Palliative Care

## 2024-02-13 NOTE — Telephone Encounter (Signed)
 Called and spoke with patient and then her caregiver as the patient does not hear well.  Advised on the recommendations per Dr. Sherene Sires.  She verbalized understanding.  Nothing further needed.

## 2024-02-18 ENCOUNTER — Encounter: Payer: Self-pay | Admitting: Internal Medicine

## 2024-03-10 ENCOUNTER — Telehealth: Payer: Self-pay | Admitting: Internal Medicine

## 2024-03-10 NOTE — Telephone Encounter (Signed)
 Asia from [facility]iative [facility] visited the patient over the weekend. Patient is experiencing low oxygen and congestion. Asia is wondering if anything else can be done. Patient is currently taking a zpak. Asia can reached at [phone]

## 2024-03-10 NOTE — Telephone Encounter (Signed)
 I called [name] at the number provided- there was no answer- LMTCB.

## 2024-03-11 MED ORDER — LEVOFLOXACIN 500 MG PO TABS: 500.0000 mg | ORAL_TABLET | Freq: Every day | ORAL | 0 refills | Status: DC

## 2024-03-11 NOTE — Telephone Encounter (Signed)
 Patient caregiver has been notified. Medication sent in.

## 2024-03-11 NOTE — Telephone Encounter (Addendum)
 Called [name] again, no answer- LMTCB  I called and spoke with [name], the pt's caregiver with pt's permission  Pt feeling bad overall and has had increased cough with yellow sputum, wheezing and increased DOE past few days  Has not had fever, but does have chills and sweats She is using all of her nebs as directed  Took zpack and will finish today without any relief  She is taking her pred 10 mg daily, I advised increase to 20 mg per action plan  Dr Sherene Sires, do you have any other recs?

## 2024-03-11 NOTE — Telephone Encounter (Signed)
 Asia returning Leslie's call. Please try again.  Use historical Pharmacy's on file.

## 2024-03-11 NOTE — Telephone Encounter (Signed)
 Levaquin 500 every day x 7 d - stop if if starts having ankle or tendon pains

## 2024-03-12 ENCOUNTER — Telehealth: Payer: Self-pay | Admitting: Internal Medicine

## 2024-03-12 NOTE — Telephone Encounter (Signed)
 Asia with [facility] is asking to speak to you regarding this PT.  CB # is [phone]

## 2024-03-12 NOTE — Telephone Encounter (Signed)
 Called and spoke with [place], pt's hospice nurse  She states that she advised the pt about the levaquin that we sent, and daughter states she had bad reaction  to this before (vomiting) She is now asking about refills on zpack- I advised that the should call the pharmacy, since we gave 5 refills on last rx  She is asking if pt should go ahead and repeat zpack again now, or what to do since bad reaction in the past to levaquin

## 2024-03-17 NOTE — Telephone Encounter (Signed)
 I have not seen the patient since 2016.  Dr. Sherene Sires has been seeing the patient since then.  Forwarding this message to Dr. Sharee Pimple

## 2024-03-18 NOTE — Telephone Encounter (Signed)
 Spoke with [name] patient's daughter regarding prior message . Patient was sent in Levaquin 500mg  on 03/11/2024. In the past patient has taken Levaquin and had a bad reaction . [name] stated patient taken the Levaquin and didn't have any issues with the medication . Nothing else further needed.

## 2024-03-18 NOTE — Telephone Encounter (Signed)
 If not allergic to doxy try doxycyline 100 mg bid x 10 d and if can't take in them do the zpak

## 2024-04-02 ENCOUNTER — Telehealth: Payer: Self-pay | Admitting: Internal Medicine

## 2024-04-02 DIAGNOSIS — J9611 Chronic respiratory failure with hypoxia: Secondary | ICD-10-CM

## 2024-04-02 DIAGNOSIS — J449 Chronic obstructive pulmonary disease, unspecified: Secondary | ICD-10-CM

## 2024-04-02 NOTE — Telephone Encounter (Signed)
 Copied from CRM [id number]. Topic: Clinical - Request for Lab/Test Order >> Apr 01, 2024  3:57 PM Renie Ora wrote: Reason for CRM: Raynelle Fanning with [facility] called stating she spoke with [facility] and they need a new prescription faxed over from Dr.Wert.   The prescription specifically needs to say "Please dispense nebulizer supplies and to include the diagnosis code".  Fax number-[phone].

## 2024-04-02 NOTE — Telephone Encounter (Signed)
 Order for neb supplies placed with [facility].

## 2024-04-06 ENCOUNTER — Telehealth: Payer: Self-pay | Admitting: Internal Medicine

## 2024-04-06 NOTE — Telephone Encounter (Signed)
 Order already completed: Type Date User Summary Attachment  Provider Comments 04/02/2024 11:01 AM Primus Brookes, CMA Provider Comments -  Note: PLEASE DISPENSE NEBULIZER SUPPLIES X1 YEAR  DiagnosisJ96.11,J96.12 (ICD-10-CM) - Chronic respiratory failure with hypoxia and hypercapnia (HCC)J44.9 (ICD-10-CM) - COPD mixed type [facility])  Type Date User Summary Attachment  General 04/02/2024  4:04 PM Bunting, Caswell Clifton; [place], Hilda Lovings; Tucker, Dolanda; Cain, Mitchell -  Note: [name]; Eitzen, Hilda Lovings; Tucker, Dolanda; Cain, Mitchell Received, Thank you

## 2024-04-06 NOTE — Telephone Encounter (Signed)
 Copied from CRM [id number]. Topic: Clinical - Request for Lab/Test Order >> Apr 01, 2024  3:57 PM Renie Ora wrote: Reason for CRM: Raynelle Fanning with [facility] called stating she spoke with [facility] and they need a new prescription faxed over from Dr.Wert.   The prescription specifically needs to say "Please dispense nebulizer supplies and to include the diagnosis code".  Fax number-[phone].

## 2024-04-06 NOTE — Telephone Encounter (Signed)
 Copied from CRM [id number]. Topic: Clinical - Request for Lab/Test Order >> Apr 01, 2024  3:57 PM Corean Deutscher wrote: Reason for CRM: Concha Deed with [facility] called stating she spoke with [employer] and they need a new prescription faxed over from Dr.Wert.   The prescription specifically needs to say "Please dispense nebulizer supplies and to include the diagnosis code".  Fax number-[phone].  Encounter is complete

## 2024-05-13 ENCOUNTER — Ambulatory Visit: Payer: Medicare Other | Admitting: Family Medicine

## 2024-06-26 ENCOUNTER — Other Ambulatory Visit: Payer: Self-pay | Admitting: Internal Medicine

## 2024-07-27 ENCOUNTER — Telehealth: Payer: Self-pay

## 2024-07-27 NOTE — Telephone Encounter (Signed)
 Copied from CRM [id number]. Topic: Clinical - Medical Advice >> Jul 27, 2024  9:27 AM [name] wrote: Reason for CRM: Patient daughter is calling because patient is currently is hospice care, she is experiencing arm pain and they would like to know if Dr.Lalonde can send in a prescription that can help with the pain. Advised that an appointment is needed; however, she explained patient is not mobile and can't come into the office.

## 2024-07-28 ENCOUNTER — Ambulatory Visit: Payer: Self-pay

## 2024-07-28 NOTE — Telephone Encounter (Signed)
 Patient daughter is calling because patient is currently is hospice care, she is experiencing arm pain and they would like to know if Dr.Lalonde can send in a prescription that can help with the pain. Advised that an appointment is needed; however, she explained patient is not mobile and can't come into the office. PT IS IN pain

## 2024-07-28 NOTE — Telephone Encounter (Signed)
 FYI Only or Action Required?: FYI only for provider.  Patient was last seen in primary care on 06/24/2023 by Early, Camie BRAVO, NP.  Called Nurse Triage reporting Arm Pain.  Symptoms began several weeks ago.  Interventions attempted: Rest, hydration, or home remedies and Ice/heat application.  Symptoms are: gradually worsening.  Triage Disposition: See PCP When Office is Open (Within 3 Days)  Patient/caregiver understands and will follow disposition?: Yes            Copied from CRM #[id number]. Topic: Clinical - Red Word Triage >> Jul 28, 2024  8:21 AM Frederich PARAS wrote: [facility] that prompted transfer to Nurse Triage:pain   Crm from 07/27/24 from prior rep : Patient daughter is calling because patient is currently is hospice care, she is experiencing arm pain and they would like to know if Dr.Lalonde can send in a prescription that can help with the pain. Advised that an appointment is needed; however, she explained patient is not mobile and can't come into the office. PT IS IN pain Reason for Disposition  [1] MODERATE pain (e.g., interferes with normal activities) AND [2] present > 3 days  Answer Assessment - Initial Assessment Questions 1. ONSET: When did the pain start?     X 3-4 weeks ago 2. LOCATION: Where is the pain located?     Left arm 3. PAIN: How bad is the pain? (Scale 0-10; or none, mild, moderate, severe)     Severe 4. WORK OR EXERCISE: Has there been any recent work or exercise that involved this part of the body?     None 5. CAUSE: What do you think is causing the arm pain?     Believes it is nerve related 6. OTHER SYMPTOMS: Do you have any other symptoms? (e.g., neck pain, swelling, rash, fever, numbness, weakness)     Difficulty sleeping  Protocols used: Arm Pain-A-AH

## 2024-07-29 ENCOUNTER — Telehealth (INDEPENDENT_AMBULATORY_CARE_PROVIDER_SITE_OTHER): Admitting: Medical

## 2024-07-29 DIAGNOSIS — M79602 Pain in left arm: Secondary | ICD-10-CM | POA: Diagnosis not present

## 2024-07-29 NOTE — Patient Instructions (Addendum)
 Keep in mind we are limited with virtual consult as I am going solely on the symptoms and appearance of the arm.  Obviously I have not examined her arm in person to be able to fully examine the arm.  Symptoms would suggest lateral epicondylitis or inflammation of the tendons/tendinitis of the left elbow.  Other causes of pain in this area could be bruising, arthritis, tumor or other.  Nevertheless you seem to have good range of motion without obvious swelling or deformity of the arm today.  Recommendations: Use over-the-counter Tylenol  1 tablet 3 times a day if needed instead of 2 tablets 3 times a day Short-term you can use ibuprofen or Aleve once a day for the next 5 days for pain and inflammation I recommend getting an arm compression sleeve for tennis elbow and wearing that daily for the next 2 weeks.  This may be available at your pharmacy but is certainly easily accessible through [employer] order I recommend using pillows to prop the arm when needed to avoid injury or to help with comfort You can continue alternating cold therapy and warm therapy such as cold cough for 20 minutes alternating with warm cloth for 20 minutes after your arm is back to room temperature I would recommend you let us  know within 2 weeks if the arm sleeve and these remedies are helping or not If not improving over the next 1 to 2 weeks and it would be helpful to do an in person eval with sports medicine or orthopedics

## 2024-07-29 NOTE — Progress Notes (Signed)
 Subjective:     Patient ID: [name], female   DOB: [birth year], 89 y.o.   MRN: [id number]  This visit type was conducted due to national recommendations for restrictions regarding the COVID-19 Pandemic (e.g. social distancing) in an effort to limit this patient's exposure and mitigate transmission in our community.  Due to their co-morbid illnesses, this patient is at least at moderate risk for complications without adequate follow up.  This format is felt to be most appropriate for this patient at this time.    Documentation for virtual audio and video telecommunications through [facility] encounter:  The patient was located at home. The provider was located in the office. The patient did consent to this visit and is aware of possible charges through their insurance for this visit.  The other persons participating in this telemedicine service were caregiver Landry Caldron. Time spent on call was 20 minutes and in review of previous records 20 minutes total.  This virtual service is not related to other E/M service within previous 7 days.   HPI Chief Complaint  Patient presents with   Acute Visit    Innter part of left elbow/ arm pain x 6 months or longer and getting worse. Some redness and tender to touch, no swelling    Virtual consult for elbow pain.   She is accompanied by her caregiver Landry Caldron as she is hard of hearing.  She notes ongoing pain in the left arm for about 6 months.  The pain is in the left lateral arm and somewhat antecubital region based on the way she is pointing in the video.  She notes no injury or trauma.  She denies any particular problems getting in and out of chairs using her arms.  She does use a toilet seat with arm supports.  She has a full-time Photographer.  She denies repetitive activity with the arm.  No particular nodule or swelling.  No bruising or redness.  She uses Tylenol , 2 tablets 3 times a day.  No shoulder or wrist pain.  No other arm  pain.  No swelling of the arm.  No other aggravating or relieving factors. No other complaint.    Review of Systems As in subjective    Objective:   Physical Exam Due to coronavirus pandemic stay at home measures, patient visit was virtual and they were not examined in person.   Gen: wd, wn, nad We discussed limitations of exam being virtual No obvious bruising or redness or obvious swelling of the arm.  She seems to have good range of motion of the arm. She is pointing to the left lateral elbow region over the lateral epicondyle      Assessment:     Encounter Diagnosis  Name Primary?   Left arm pain Yes       Plan:     Keep in mind we are limited with virtual consult as I am going solely on the symptoms and appearance of the arm.  Obviously I have not examined her arm in person to be able to fully examine the arm.  Symptoms would suggest lateral epicondylitis or inflammation of the tendons/tendinitis of the left elbow.    Other causes of pain in this area could be bruising, arthritis, tumor or other.  Nevertheless you seem to have good range of motion without obvious swelling or deformity of the arm today  Recommendations: Use over-the-counter Tylenol  1 tablet 3 times a day if needed instead of 2 tablets 3  times a day Short-term you can use ibuprofen or Aleve once a day for the next 5 days for pain and inflammation I recommend getting an arm compression sleeve for tennis elbow and wearing that daily for the next 2 weeks.  This may be available at your pharmacy but is certainly easily accessible through [employer] order I recommend using pillows to prop the arm when needed to avoid injury or to help with comfort You can continue alternating cold therapy and warm therapy such as cold cough for 20 minutes alternating with warm cloth for 20 minutes after your arm is back to room temperature I would recommend you let us  know within 2 weeks if the arm sleeve and these remedies  are helping or not If not improving over the next 1 to 2 weeks and it would be helpful to do an in person eval with sports medicine or orthopedics  [name] was seen today for acute visit.  Diagnoses and all orders for this visit:  Left arm pain  Follow-up in 2 weeks

## 2024-09-24 ENCOUNTER — Telehealth: Payer: Self-pay

## 2024-09-24 NOTE — Telephone Encounter (Unsigned)
 Copied from CRM #[id number]. Topic: Clinical - Prescription Issue >> Sep 22, 2024  2:36 PM Joesph PARAS wrote: Reason for CRM:  Patient's daughter is calling in to state that [facility] delivers revefenacin  (YUPELRI ) 175 MCG/3ML nebulizer solution and formoterol  (PERFOROMIST ) 20 MCG/2ML nebulizer solution. Every time she needs a refill, they ask if she is in hospice and patient's daughter states that if she is on hospice, they will not fill these medications. They never tell her why.   Patient is looking into options for a nursing facility but is being blocked because she will no longer be able to get medications if she does.  Patient's daughter would like an update on alternatives.   Tried to reach out to patient in regards to message  VM/LM to return call    ( Dr. Darlean said They all prefer duoneb qid which is a shorter acting version and will work just as well but obviously more of a burden in terms of convenience of the bid version )

## 2024-09-25 NOTE — Telephone Encounter (Signed)
 X 1 VM / LM

## 2024-09-28 ENCOUNTER — Telehealth: Payer: Self-pay

## 2024-09-28 NOTE — Telephone Encounter (Signed)
 Copied from CRM #[id number]. Topic: Clinical - Prescription Issue >> Sep 22, 2024  2:36 PM Joesph PARAS wrote: Reason for CRM:  Patient's daughter is calling in to state that [facility] delivers revefenacin  (YUPELRI ) 175 MCG/3ML nebulizer solution and formoterol  (PERFOROMIST ) 20 MCG/2ML nebulizer solution. Every time she needs a refill, they ask if she is in hospice and patient's daughter states that if she is on hospice, they will not fill these medications. They never tell her why.   Patient is looking into options for a nursing facility but is being blocked because she will no longer be able to get medications if she does.  Patient's daughter would like an update on alternatives. >> Sep 25, 2024  2:55 PM Rozanna MATSU wrote: Niels was returning the nurses call, advised the cliinic was closed and someone will reach her on Monday   Already been address by provider    -NFN

## 2024-10-01 ENCOUNTER — Other Ambulatory Visit: Payer: Self-pay | Admitting: Internal Medicine

## 2024-10-07 ENCOUNTER — Ambulatory Visit: Payer: Self-pay | Admitting: Internal Medicine

## 2024-10-07 ENCOUNTER — Telehealth: Payer: Self-pay

## 2024-10-07 NOTE — Telephone Encounter (Signed)
 Daughter in law [name] is following up on these meds that have be denied.  She has called several times w/ no response.  Please call her at  [phone] ( Pt is bedridden, has a full time caregiver 24 hrs/day. It is for the revefenacin  (YUPELRI ) 175 MCG/3ML nebulizer solution  and  formoterol  (PERFOROMIST ) 20 MCG/2ML nebulizer solution  Pt unable to come into office for appt.

## 2024-10-07 NOTE — Telephone Encounter (Unsigned)
 Copied from CRM [id number]. Topic: Clinical - Prescription Issue >> Oct 05, 2024  9:02 AM Nathanel DEL wrote: Reason for CRM:  Daughter Niels calling.  pt gets formoterol  (PERFOROMIST ) 20 MCG/2ML nebulizer solution and revefenacin  (YUPELRI ) 175 MCG/3ML nebulizer solution through [facility] . This med was refused when asked for refill. Pt is bedridden and cannot sit up more than 15 min.  Pt has 24 hr care in the home. Pt has about 20 days left and if Dr Darlean will not fill, pt needs alternative., b/c she cannot come in to office. Please advise Niels phone number is [phone] >> Oct 06, 2024 10:19 AM Rozanna MATSU wrote: Pt daughter Niels phone number is [phone] calling back about her moms meds and previous message that was sent yesterday morning. Thanks    Sent new Rx , if pharmacy will not fill she can change over to  duoneb qid , okay per Dr. Darlean

## 2024-10-07 NOTE — Telephone Encounter (Signed)
 Patient's daughter, [name], called to state that they would like to stay on the program through [facility] If they are unable to stay on this program or are required to private pay at an unaffordable higher cost, then they would like to switch to duoneb.   Patient is not on hospice   Reason for CRM: [name] calling. pt gets formoterol  (PERFOROMIST ) 20 MCG/2ML nebulizer solution and revefenacin  (YUPELRI ) 175 MCG/3ML nebulizer solution through [facility] .   Reason for Disposition  Caller has medicine question only, adult not sick, AND triager answers question  Answer Assessment - Initial Assessment Questions 1. NAME of MEDICINE: What medicine(s) are you calling about?     Formoterol  and Revefenacin  2. QUESTION: What is your question? (e.g., double dose of medicine, side effect)     Can patient stay on [facility] program 3. PRESCRIBER: Who prescribed the medicine? Reason: if prescribed by specialist, call should be referred to that group.     Dr. Darlean  Protocols used: Medication Question Call-A-AH

## 2024-10-08 ENCOUNTER — Other Ambulatory Visit: Payer: Self-pay

## 2024-10-08 MED ORDER — YUPELRI 175 MCG/3ML IN SOLN: RESPIRATORY_TRACT | 11 refills | Status: DC

## 2024-10-08 MED ORDER — FORMOTEROL FUMARATE 20 MCG/2ML IN NEBU: INHALATION_SOLUTION | RESPIRATORY_TRACT | 11 refills | Status: DC

## 2024-10-08 NOTE — Telephone Encounter (Signed)
 Duplicate. See 10/15 encounter.

## 2024-10-08 NOTE — Telephone Encounter (Signed)
 Patient / [name].   Sent new Rx.   if pharmacy will not fill she can change over to will call back to change to duoneb qid  -NFN

## 2024-10-09 ENCOUNTER — Telehealth: Payer: Self-pay

## 2024-10-09 ENCOUNTER — Other Ambulatory Visit: Payer: Self-pay

## 2024-10-09 MED ORDER — FORMOTEROL FUMARATE 20 MCG/2ML IN NEBU: INHALATION_SOLUTION | RESPIRATORY_TRACT | 11 refills | Status: AC

## 2024-10-09 MED ORDER — ALBUTEROL SULFATE (2.5 MG/3ML) 0.083% IN NEBU: INHALATION_SOLUTION | RESPIRATORY_TRACT | 3 refills | Status: AC

## 2024-10-09 NOTE — Telephone Encounter (Signed)
 Copied from CRM #[id number]. Topic: Clinical - Prescription Issue >> Oct 08, 2024 11:07 AM Corean SAUNDERS wrote: Reason for CRM: Patients daughter Niels is calling as the patients medication-  revefenacin  (YUPELRI ) 175 MCG/3ML nebulizer solution  And formoterol  (PERFOROMIST ) 20 MCG/2ML nebulizer solution has been sent to the   wrong pharmacy, PLEASE SEND TO  [facility] [place] [place] - [address] [place] [address] Phone: [phone] Fax: [phone] >> Oct 09, 2024 12:16 PM Corean SAUNDERS wrote: [facility] is requesting an update as patient needs medication as soon as possible.     Rx order has been sent to [facility] pharmacy    -NFN

## 2024-10-12 MED ORDER — YUPELRI 175 MCG/3ML IN SOLN: RESPIRATORY_TRACT | 11 refills | Status: AC

## 2024-10-12 NOTE — Telephone Encounter (Signed)
 Copied from CRM #[id number]. Topic: Clinical - Prescription Issue >> Oct 08, 2024 11:07 AM Corean SAUNDERS wrote: Reason for CRM: Patients daughter Niels is calling as the patients medication-  revefenacin  (YUPELRI ) 175 MCG/3ML nebulizer solution  And formoterol  (PERFOROMIST ) 20 MCG/2ML nebulizer solution has been sent to the   wrong pharmacy, PLEASE SEND TO  [facility] - [place], [place] - [address] [place] [address] Phone: [phone] Fax: [phone] >> Oct 12, 2024 10:09 AM Joesph PARAS wrote: [facility] is calling to state that the Yupelri  was not received. According to chart, rx was sent to wrong pharmacy. Yupelri  needs to be sent to [facility]. Please resend, patient is running low on medication.  >> Oct 09, 2024 12:16 PM Corean SAUNDERS wrote: [facility] is requesting an update as patient needs medication as soon as possible.   Aformoterol has already been sent on 10/09/2024.  Script sent for Yupelri .

## 2024-10-12 NOTE — Telephone Encounter (Signed)
 I called and spoke with patients' daughter Terry, she clarified that the Aformoterol had been sent to [facility], however the Yupelri  had not.  Apologized for the mix up and advised I had sent the Yupelri  to [facility].  She verbalized understanding and was appreciative.  Nothing further needed.

## 2024-10-12 NOTE — Telephone Encounter (Signed)
 Niels Han, patient's daughter, called today to make a formal complaint about the patient's medication not being refilled.  I let her know that it was sent to the pharmacy on 10/09/24 at 1:04 pm.  She will call the pharmacy to verify.  If they don't have it she will call back.

## 2024-10-27 ENCOUNTER — Encounter

## 2024-12-14 ENCOUNTER — Other Ambulatory Visit: Payer: Self-pay | Admitting: Internal Medicine

## 2024-12-16 ENCOUNTER — Telehealth: Payer: Self-pay | Admitting: *Deleted

## 2024-12-16 MED ORDER — PREDNISONE 10 MG PO TABS: 10.0000 mg | ORAL_TABLET | Freq: Every day | ORAL | 0 refills | Status: DC

## 2024-12-16 NOTE — Telephone Encounter (Signed)
 Copied from CRM #[id number]. Topic: Clinical - Medication Question >> Dec 16, 2024  9:23 AM Isabell A wrote: Reason for CRM: Patient would like to confirm why her prednisone  has been denied.    Callback number: [phone]  Pred was denied since she has not been seen in over a year  I called and spoke with her daughter, Niels  She states pt is unable to come in and asked could we do a virtual visit with Dr Darlean  She has bed ridden and has not left home in 6 months due to being chronically ill  She has refused to renew her hospice care  I refilled pred 10 mg # 30  Dr Darlean, please advise on virtual visit- daughter says she will be there to help her  Thanks!

## 2024-12-21 NOTE — Telephone Encounter (Signed)
 Daughter has to assist with Video Visit with patient. Appointment scheduled for the end of January when daughter will be available to help.

## 2024-12-21 NOTE — Telephone Encounter (Signed)
 Can someone please get her scheduled for next available virtual with MW next time he is in [place] thank you

## 2025-01-21 ENCOUNTER — Encounter: Payer: Self-pay | Admitting: Internal Medicine

## 2025-01-21 ENCOUNTER — Telehealth: Admitting: Internal Medicine

## 2025-01-21 DIAGNOSIS — J9611 Chronic respiratory failure with hypoxia: Secondary | ICD-10-CM | POA: Diagnosis not present

## 2025-01-21 DIAGNOSIS — J9612 Chronic respiratory failure with hypercapnia: Secondary | ICD-10-CM

## 2025-01-21 DIAGNOSIS — J449 Chronic obstructive pulmonary disease, unspecified: Secondary | ICD-10-CM

## 2025-01-21 MED ORDER — PREDNISONE 10 MG PO TABS: ORAL_TABLET | ORAL | 3 refills | Status: DC

## 2025-01-21 MED ORDER — AZITHROMYCIN 250 MG PO TABS: ORAL_TABLET | ORAL | 5 refills | Status: AC

## 2025-01-21 NOTE — Progress Notes (Signed)
 "   Subjective:   Patient ID: [name]     DOB: 1932/05/08     MRN: [id number]  Brief patient profile:  89 yowf quit smoking 02/2013  referred 02/08/2012 by [facility] for intermittent sob was on 02 but stopped in early 2013 and with GOLD III COPD documented 03/2012   History of Present Illness  11/09/2016  f/u ov/Zola Runion re:   GOLD III / maint perforomist / bud/Incruse/ prn saba neb  Chief Complaint  Patient presents with   Follow-up    Pt called on 11/05/16 with c/o cough with yellow sputum- zpack and pred taper given. She is much improved and barely coughing at all at this point. Breathing is at her normal baseline.    baseline doe = MMRC3 = can't walk 100 yards even at a slow pace at a flat grade s stopping due to sob  Even on 02  rec For flares of cough/ wheeze/ short of breath assoc with change in mucus as you have in the past  >>> zpak / Prednisone  10 mg take  4 each am x 2 days,   2 each am x 2 days,  1 each am x 2 days and stop      07/02/2022  f/u ov/Beckem Tomberlin re: GOLD 3 copd  maint on perforomist  / yupelri   a zpak prn avg qom Prednisone  10 mg  Chief Complaint  Patient presents with   Follow-up    Pt states she has not been doing too good. Pt states she has no energy and now using a walker.   Dyspnea:  room to room with rollator on palliative care  Cough: min mucoid in am / slt smoker's rattle last used zpak on month prior to OV  with greens mucus which cleared  Sleeping: on a wedge/ one pillow  SABA use: none  02: 3lpm 24/7  Rec No change rx    08/06/2023  f/u ov/Tyliah Schlereth re: GOLD 3/ hypercarbic and hypoxemic RF   maint on performist/yupelri  and prednisone  10 mg daily   Chief Complaint  Patient presents with   Follow-up   Dyspnea:  room on room on 3lpm continuous/ pulsing when out and not titrating Cough: minimal mucoid Sleeping: wedge  s resp cc  SABA use: none  02: 3lpm 24/7 Rec Make sure you check your oxygen  saturation  AT  your highest level of activity (not after  you stop)   to be sure it stays over 90%    Virtual Visit via Telephone Note 01/21/2025   I connected with [name] on 01/21/25 at 11:00 AM EST by EPIC link  and verified that I am speaking with the correct person using two identifiers and visually. Pt is at home and this call made from my office with no other participants here and her daughter there serving as interpreter as pt has very poor hearing    I discussed the limitations, risks, security and privacy concerns of performing an evaluation and management service virtually  and the availability of in person appointments. I also discussed with the patient that there may be a patient responsible charge related to this service. The patient expressed understanding and agreed to proceed.   History of Present Illness: Bedridden now for the most part/ hospice has signed off but has24 h care. 02  4lpm 24/7 cont at 98%  Yupelri  daily and performist  Prednisone  10 mg maint  Mostly stays in bed now  Sometimes able to pivot into bsc with 24  care Limited by doe > weak Min cough / swollowing bed Bed is flat has wedge large pillow / sleeps much of day and night  Also followed by Joyce /   Pain in L hips from lying on that side    No obvious day to day or daytime variability or assoc excess/ purulent sputum or mucus plugs or hemoptysis or cp or chest tightness, subjective wheeze or overt sinus or hb symptoms.   Also denies any obvious fluctuation of symptoms with weather or environmental changes or other aggravating or alleviating factors except as outlined above.   Meds reviewed/ med list updated         Observations/Objective: Appears severely debilitated but alert, approp and denies pain or air hunger and no conversational dyspnea/ wearing 02 at lying back in bed at about 45 degreees   Synopsis: Very frail elderly wf with Endstage COPD/ steroid/ 02 dep and complicated by hypoxemic/hypercarbic respiratory failure / severe  generalize deconditioning and muscle wasting.   Assessment & Plan Chronic respiratory failure with hypoxia and hypercapnia (HCC) Onset 02 rx 2013 initially just at hs   - ono RA 05/14/12   4 h 33 min < 89%   So ordered 02 2lpm    - HCO3  32 05/10/13    - 01/05/2013   Walked RA x one lap @ 185 stopped due to  88%   - 01/05/2013  Walked 2lpm 2 laps @ 185 ft each stopped due to  90% sob but improved vs baseline   - RA 05/01/2013 sats = 87%  - 05/10/2017 > 02 sats 88% RA > referred for POC eval > preferred continuous flow - 05/14/2019   Walked RA  1 laps @  approx 125ft each @ slow pace  stopped due to sob and desats to 84% corrected on 2lpm and completed lap s desats -  HCO3    07/02/2022   = 35    >>> titrate to sats low / mid 90s   End stage COPD (HCC)  Quit smoking 02/2013   - PFTs 03/24/2012  FEV1  0.91 (43%) ratio 48 and no better p B2,  DLCO 38% corrects to 53%  - changed to perforomist /bud bid 04/2013  -2015 pulmonary rehab  - med calendar 07/17/13 > did not bring 06/28/2014 , .redone 07/26/2014  - 06/28/2014 p extensive coaching HFA effectiveness =    75% (short Ti) - 08/24/14 started IMPACT trial >finished 08/2015  - PFTs 12/13/14  FEV1 0.85 (38%) ratio 45  p 5 % improvement from saba  09/20/2015 Med calendar  - 05/08/2016 changed lama to incruse  - 11/12/2017  After extensive coaching HFA effectiveness =   75% (short Ti) - 11/12/17 > changed to trelegy   - 02/07/2021  Flare of cough on anoro >  Try gerd rx  - Prednisone  ceiling of 20 and floor of 10 mg daily 02/16/21  - 02/21/2021 changed to  performist bid and yupelri  q am > improved 03/21/2021  - 08/06/2023 stabilized on pred 10 mg daily    >>> no change rx    Follow Up Instructions: Continue prensione 10 mg daily  Continue yupelri  / performist nebs  Continue 02 4lpm titrated to low to mid 90s  Zpak prn discolored sputum  F/u in 1 year, sooner if needed     I discussed the assessment and treatment plan with the patient and daughter.  The  patient was provided an opportunity to ask questions and all were answered.  The patient agreed with the plan and demonstrated an understanding of the instructions.   The patient was advised to call back or seek an in-person evaluation if the symptoms worsen or if the condition fails to improve as anticipated.  I provided total of 30  minutes for this visit with precharting / post charting and face to face time virtually     Debra America, MD 01/21/2025           "

## 2025-01-21 NOTE — Assessment & Plan Note (Addendum)
"   Quit smoking 02/2013   - PFTs 03/24/2012  FEV1  0.91 (43%) ratio 48 and no better p B2,  DLCO 38% corrects to 53%  - changed to perforomist /bud bid 04/2013  -2015 pulmonary rehab  - med calendar 07/17/13 > did not bring 06/28/2014 , .redone 07/26/2014  - 06/28/2014 p extensive coaching HFA effectiveness =    75% (short Ti) - 08/24/14 started IMPACT trial >finished 08/2015  - PFTs 12/13/14  FEV1 0.85 (38%) ratio 45  p 5 % improvement from saba  09/20/2015 Med calendar  - 05/08/2016 changed lama to incruse  - 11/12/2017  After extensive coaching HFA effectiveness =   75% (short Ti) - 11/12/17 > changed to trelegy   - 02/07/2021  Flare of cough on anoro >  Try gerd rx  - Prednisone  ceiling of 20 and floor of 10 mg daily 02/16/21  - 02/21/2021 changed to  performist bid and yupelri  q am > improved 03/21/2021  - 08/06/2023 stabilized on pred 10 mg daily    >>> no change rx  "

## 2025-01-21 NOTE — Assessment & Plan Note (Addendum)
 Onset 02 rx 2013 initially just at hs   - ono RA 05/14/12   4 h 33 min < 89%   So ordered 02 2lpm    - HCO3  32 05/10/13    - 01/05/2013   Walked RA x one lap @ 185 stopped due to  88%   - 01/05/2013  Walked 2lpm 2 laps @ 185 ft each stopped due to  90% sob but improved vs baseline   - RA 05/01/2013 sats = 87%  - 05/10/2017 > 02 sats 88% RA > referred for POC eval > preferred continuous flow - 05/14/2019   Walked RA  1 laps @  approx 138ft each @ slow pace  stopped due to sob and desats to 84% corrected on 2lpm and completed lap s desats -  HCO3    07/02/2022   = 35    >>> titrate to sats low / mid 90s

## 2025-01-22 ENCOUNTER — Encounter: Payer: Self-pay | Admitting: Internal Medicine

## 2025-01-22 ENCOUNTER — Telehealth: Payer: Self-pay | Admitting: *Deleted

## 2025-01-22 MED ORDER — PREDNISONE 10 MG PO TABS: ORAL_TABLET | ORAL | 3 refills | Status: AC

## 2025-01-22 NOTE — Telephone Encounter (Signed)
 Copied from CRM #[id number]. Topic: Clinical - Prescription Issue >> Jan 22, 2025  9:57 AM Corean SAUNDERS wrote: Reason for CRM: Patients daughter states she  needs Dr. Darlean to please be more specific with the predniSONE  (DELTASONE ) 10 MG tablet directions because it states to take as directed but the insurance wont cover this without specification.   I have sent new rx to pharmacy with directions  Notified via mychart msg pt sent regarding same  Nothing further needed
